# Patient Record
Sex: Male | Born: 1950 | Race: White | Hispanic: No | Marital: Married | Smoking: Never smoker
Health system: Southern US, Community
[De-identification: ages and names within clinical notes are randomized; demographics above are authoritative.]

## PROBLEM LIST (undated history)

## (undated) DIAGNOSIS — I509 Heart failure, unspecified: Secondary | ICD-10-CM

## (undated) DIAGNOSIS — E119 Type 2 diabetes mellitus without complications: Secondary | ICD-10-CM

## (undated) DIAGNOSIS — I10 Essential (primary) hypertension: Secondary | ICD-10-CM

## (undated) DIAGNOSIS — R0602 Shortness of breath: Secondary | ICD-10-CM

## (undated) DIAGNOSIS — M1990 Unspecified osteoarthritis, unspecified site: Secondary | ICD-10-CM

## (undated) DIAGNOSIS — I82459 Acute embolism and thrombosis of unspecified peroneal vein: Secondary | ICD-10-CM

## (undated) DIAGNOSIS — E785 Hyperlipidemia, unspecified: Secondary | ICD-10-CM

## (undated) DIAGNOSIS — F419 Anxiety disorder, unspecified: Secondary | ICD-10-CM

## (undated) DIAGNOSIS — E669 Obesity, unspecified: Secondary | ICD-10-CM

## (undated) DIAGNOSIS — Z9289 Personal history of other medical treatment: Secondary | ICD-10-CM

## (undated) DIAGNOSIS — Z9889 Other specified postprocedural states: Secondary | ICD-10-CM

## (undated) DIAGNOSIS — I2510 Atherosclerotic heart disease of native coronary artery without angina pectoris: Secondary | ICD-10-CM

## (undated) HISTORY — DX: Acute embolism and thrombosis of unspecified peroneal vein: I82.459

## (undated) HISTORY — DX: Hyperlipidemia, unspecified: E78.5

## (undated) HISTORY — DX: Heart failure, unspecified: I50.9

## (undated) HISTORY — DX: Anxiety disorder, unspecified: F41.9

## (undated) HISTORY — DX: Obesity, unspecified: E66.9

## (undated) HISTORY — DX: Essential (primary) hypertension: I10

## (undated) HISTORY — DX: Other specified postprocedural states: Z98.890

## (undated) HISTORY — PX: SHOULDER SURGERY: SHX246

## (undated) HISTORY — DX: Personal history of other medical treatment: Z92.89

## (undated) HISTORY — PX: HERNIA REPAIR: SHX51

## (undated) HISTORY — DX: Type 2 diabetes mellitus without complications: E11.9

---

## 2001-11-28 HISTORY — PX: CORONARY ARTERY BYPASS GRAFT: SHX141

## 2001-11-28 HISTORY — PX: BYPASS GRAFT: SHX909

## 2002-01-31 ENCOUNTER — Inpatient Hospital Stay (HOSPITAL_COMMUNITY): Admission: EM | Admit: 2002-01-31 | Discharge: 2002-02-12 | Payer: Self-pay | Admitting: *Deleted

## 2002-01-31 ENCOUNTER — Encounter: Payer: Self-pay | Admitting: *Deleted

## 2002-01-31 ENCOUNTER — Encounter: Payer: Self-pay | Admitting: Cardiovascular Disease

## 2002-02-01 ENCOUNTER — Encounter: Payer: Self-pay | Admitting: Cardiovascular Disease

## 2002-02-01 HISTORY — PX: CARDIAC CATHETERIZATION: SHX172

## 2002-02-05 ENCOUNTER — Encounter: Payer: Self-pay | Admitting: Cardiothoracic Surgery

## 2002-02-06 ENCOUNTER — Encounter: Payer: Self-pay | Admitting: Cardiothoracic Surgery

## 2002-02-07 ENCOUNTER — Encounter: Payer: Self-pay | Admitting: Cardiothoracic Surgery

## 2002-02-08 ENCOUNTER — Encounter: Payer: Self-pay | Admitting: Cardiothoracic Surgery

## 2002-03-07 ENCOUNTER — Encounter: Admission: RE | Admit: 2002-03-07 | Discharge: 2002-03-07 | Payer: Self-pay | Admitting: Cardiothoracic Surgery

## 2002-03-07 ENCOUNTER — Encounter: Payer: Self-pay | Admitting: Cardiothoracic Surgery

## 2003-11-29 HISTORY — PX: PELVIC LAPAROSCOPY: SHX162

## 2004-07-21 ENCOUNTER — Encounter: Admission: RE | Admit: 2004-07-21 | Discharge: 2004-09-20 | Payer: Self-pay | Admitting: Family Medicine

## 2006-02-22 ENCOUNTER — Encounter: Admission: RE | Admit: 2006-02-22 | Discharge: 2006-03-28 | Payer: Self-pay | Admitting: Orthopedic Surgery

## 2006-08-21 ENCOUNTER — Encounter: Admission: RE | Admit: 2006-08-21 | Discharge: 2006-08-21 | Payer: Self-pay | Admitting: Cardiovascular Disease

## 2006-08-25 ENCOUNTER — Encounter: Payer: Self-pay | Admitting: Cardiovascular Disease

## 2006-08-25 DIAGNOSIS — Z9889 Other specified postprocedural states: Secondary | ICD-10-CM

## 2006-08-25 HISTORY — DX: Other specified postprocedural states: Z98.890

## 2007-04-12 ENCOUNTER — Emergency Department (HOSPITAL_COMMUNITY): Admission: EM | Admit: 2007-04-12 | Discharge: 2007-04-12 | Payer: Self-pay | Admitting: Emergency Medicine

## 2007-06-26 ENCOUNTER — Inpatient Hospital Stay (HOSPITAL_COMMUNITY): Admission: RE | Admit: 2007-06-26 | Discharge: 2007-07-02 | Payer: Self-pay | Admitting: Cardiovascular Disease

## 2009-12-18 ENCOUNTER — Emergency Department (HOSPITAL_COMMUNITY): Admission: EM | Admit: 2009-12-18 | Discharge: 2009-12-18 | Payer: Self-pay | Admitting: Emergency Medicine

## 2010-01-25 ENCOUNTER — Encounter: Admission: RE | Admit: 2010-01-25 | Discharge: 2010-01-25 | Payer: Self-pay | Admitting: Family Medicine

## 2010-03-22 ENCOUNTER — Encounter: Payer: Self-pay | Admitting: Cardiovascular Disease

## 2010-11-26 ENCOUNTER — Encounter
Admission: RE | Admit: 2010-11-26 | Discharge: 2010-11-26 | Payer: Self-pay | Source: Home / Self Care | Attending: Family Medicine | Admitting: Family Medicine

## 2010-11-26 ENCOUNTER — Encounter: Payer: Self-pay | Admitting: Internal Medicine

## 2010-12-02 ENCOUNTER — Encounter: Payer: Self-pay | Admitting: Internal Medicine

## 2010-12-02 DIAGNOSIS — Z9289 Personal history of other medical treatment: Secondary | ICD-10-CM

## 2010-12-02 HISTORY — DX: Personal history of other medical treatment: Z92.89

## 2010-12-13 ENCOUNTER — Encounter: Payer: Self-pay | Admitting: Internal Medicine

## 2011-01-07 ENCOUNTER — Institutional Professional Consult (permissible substitution) (INDEPENDENT_AMBULATORY_CARE_PROVIDER_SITE_OTHER): Payer: No Typology Code available for payment source | Admitting: Internal Medicine

## 2011-01-07 ENCOUNTER — Encounter: Payer: Self-pay | Admitting: Internal Medicine

## 2011-01-07 DIAGNOSIS — I2699 Other pulmonary embolism without acute cor pulmonale: Secondary | ICD-10-CM

## 2011-01-07 DIAGNOSIS — R635 Abnormal weight gain: Secondary | ICD-10-CM | POA: Insufficient documentation

## 2011-01-07 DIAGNOSIS — R0602 Shortness of breath: Secondary | ICD-10-CM | POA: Insufficient documentation

## 2011-01-07 DIAGNOSIS — F411 Generalized anxiety disorder: Secondary | ICD-10-CM | POA: Insufficient documentation

## 2011-01-07 DIAGNOSIS — E785 Hyperlipidemia, unspecified: Secondary | ICD-10-CM | POA: Insufficient documentation

## 2011-01-07 DIAGNOSIS — I82409 Acute embolism and thrombosis of unspecified deep veins of unspecified lower extremity: Secondary | ICD-10-CM | POA: Insufficient documentation

## 2011-01-11 ENCOUNTER — Institutional Professional Consult (permissible substitution): Payer: Self-pay | Admitting: Pulmonary Disease
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
## 2011-01-25 ENCOUNTER — Encounter: Payer: Self-pay | Admitting: Internal Medicine

## 2011-01-25 ENCOUNTER — Ambulatory Visit (INDEPENDENT_AMBULATORY_CARE_PROVIDER_SITE_OTHER): Payer: No Typology Code available for payment source | Admitting: Internal Medicine

## 2011-01-25 ENCOUNTER — Encounter (INDEPENDENT_AMBULATORY_CARE_PROVIDER_SITE_OTHER): Payer: No Typology Code available for payment source

## 2011-01-25 DIAGNOSIS — R0602 Shortness of breath: Secondary | ICD-10-CM

## 2011-01-25 DIAGNOSIS — I2699 Other pulmonary embolism without acute cor pulmonale: Secondary | ICD-10-CM

## 2011-02-03 NOTE — Miscellaneous (Signed)
Summary: Orders Update  Clinical Lists Changes  Orders: Added new Service order of No Charge Patient Arrived (NCPA0) (NCPA0) - Signed 

## 2011-02-03 NOTE — Letter (Signed)
Summary: [facility]   Imported By: Sherian Rein 01/26/2011 09:58:40  _____________________________________________________________________  External Attachment:    Type:   Image     Comment:   External Document
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
## 2011-02-03 NOTE — Letter (Signed)
Summary: [facility]   Imported By: Sherian Rein 01/26/2011 09:59:35  _____________________________________________________________________  External Attachment:    Type:   Image     Comment:   External Document

## 2011-02-13 LAB — POCT CARDIAC MARKERS
CKMB, poc: 1.7 ng/mL (ref 1.0–8.0)
CKMB, poc: 2.1 ng/mL (ref 1.0–8.0)
Myoglobin, poc: 56.5 ng/mL (ref 12–200)
Myoglobin, poc: 64.5 ng/mL (ref 12–200)
Troponin i, poc: <0.05 ng/mL (ref 0.00–0.09)
Troponin i, poc: <0.05 ng/mL (ref 0.00–0.09)

## 2011-02-13 LAB — COMPREHENSIVE METABOLIC PANEL
ALT: 22 U/L (ref 0–53)
AST: 24 U/L (ref 0–37)
Albumin: 3.8 g/dL (ref 3.5–5.2)
Alkaline Phosphatase: 37 U/L — ABNORMAL LOW (ref 39–117)
BUN: 14 mg/dL (ref 6–23)
CO2: 28 mEq/L (ref 19–32)
Calcium: 9.1 mg/dL (ref 8.4–10.5)
Chloride: 101 mEq/L (ref 96–112)
Creatinine, Ser: 0.94 mg/dL (ref 0.4–1.5)
GFR calc Af Amer: >60 mL/min (ref >60)
GFR calc non Af Amer: >60 mL/min (ref >60)
Glucose, Bld: 118 mg/dL — ABNORMAL HIGH (ref 70–99)
Potassium: 4.2 mEq/L (ref 3.5–5.1)
Sodium: 137 mEq/L (ref 135–145)
Total Bilirubin: 0.5 mg/dL (ref 0.3–1.2)
Total Protein: 6.7 g/dL (ref 6.0–8.3)

## 2011-02-13 LAB — CBC
HCT: 37.9 % — ABNORMAL LOW (ref 39.0–52.0)
Hemoglobin: 13.3 g/dL (ref 13.0–17.0)
MCHC: 35 g/dL (ref 30.0–36.0)
MCV: 84.6 fL (ref 78.0–100.0)
Platelets: 197 10*3/uL (ref 150–400)
RBC: 4.48 MIL/uL (ref 4.22–5.81)
RDW: 14.1 % (ref 11.5–15.5)
WBC: 7.1 10*3/uL (ref 4.0–10.5)

## 2011-02-13 LAB — URINALYSIS, ROUTINE W REFLEX MICROSCOPIC
Bilirubin Urine: NEGATIVE
Glucose, UA: NEGATIVE mg/dL
Hgb urine dipstick: NEGATIVE
Ketones, ur: NEGATIVE mg/dL
Nitrite: NEGATIVE
Protein, ur: NEGATIVE mg/dL
Specific Gravity, Urine: 1.011 (ref 1.005–1.030)
Urobilinogen, UA: 0.2 mg/dL (ref 0.0–1.0)
pH: 8 (ref 5.0–8.0)

## 2011-02-13 LAB — DIFFERENTIAL
Basophils Absolute: 0 10*3/uL (ref 0.0–0.1)
Basophils Relative: 0 % (ref 0–1)
Eosinophils Absolute: 0.1 10*3/uL (ref 0.0–0.7)
Eosinophils Relative: 1 % (ref 0–5)
Lymphocytes Relative: 28 % (ref 12–46)
Lymphs Abs: 2 10*3/uL (ref 0.7–4.0)
Monocytes Absolute: 0.7 10*3/uL (ref 0.1–1.0)
Monocytes Relative: 10 % (ref 3–12)
Neutro Abs: 4.3 10*3/uL (ref 1.7–7.7)
Neutrophils Relative %: 60 % (ref 43–77)

## 2011-02-13 LAB — URINE CULTURE
Colony Count: NO GROWTH
Culture: NO GROWTH

## 2011-02-13 LAB — LIPASE, BLOOD: Lipase: 30 U/L (ref 11–59)

## 2011-02-21 DIAGNOSIS — Z9289 Personal history of other medical treatment: Secondary | ICD-10-CM

## 2011-02-21 HISTORY — DX: Personal history of other medical treatment: Z92.89

## 2011-03-25 ENCOUNTER — Ambulatory Visit: Payer: No Typology Code available for payment source | Admitting: Internal Medicine
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
## 2011-04-15 NOTE — Procedures (Signed)
[place] [facility]  Patient:    Eduardo Butler, Eduardo Butler Visit Number: [id number] MRN: [id number]          Service Type: MED Location: 2000 2031 01 Attending Physician:  Waldo Laine Dictated by:   Doylene Canning. Ladona Ridgel, M.D. [facility] Proc. Date: 02/12/02 Admit Date:  01/31/2002 Discharge Date: 02/12/2002   CC:         Ramon Dredge B. Tyrone Sage, M.D.  [name], M.D.  Anna Genre Little, M.D.   Procedure Report  PROCEDURE PERFORMED:  Invasive electrophysiologic testing.  INDICATIONS FOR PROCEDURE:  Ventricular fibrillation arrest in the setting of no acute myocardial infarction, status post coronary artery bypass grafting with preserved left ventricular function.  INTRODUCTION:  The patient is a very pleasant 60 year old man who was exercising when he had experienced a cardiac arrest due to ventricular fibrillation.  His initial EKG did not demonstrate myocardial infarction.  He was taken to the catheterization lab where he had severe three vessel disease, including a chronically occluded left anterior descending artery filled by collaterals.  The patient subsequently underwent cardiac revascularization with bypass grafting, and at the time of his cath, was found to have preserved left ventricular function.  He is now referred for invasive electrophysiologic testing.  DESCRIPTION OF PROCEDURE:  After informed consent was obtained, the patient was taken to the diagnostic EP lab in a fasted state.  After the usual preparation and draping, intravenous phentanyl and midazolam was given for sedation.  A 5 French quadripolar catheter was inserted percutaneously in the right femoral vein and advanced to the RV apex.  A 5 French quadripolar catheter was inserted percutaneously in the right femoral vein and advanced to the hisbundle region.  After measurement of the basic intervals, rapid ventricular pacing was carried up in the RV apex demonstrating a VA  wenkebach cycle length of 490 milliseconds.  Programmed ventricular stimulation was carried out from the RV apex at a basic dry cycle length of 600 milliseconds.  The S1/S2 interval was stepwise decreased down to 310 milliseconds with a retrograde AV node ERP was observed. During programmed ventricular stimulation, the atrial activation sequence was midline and decremental.  Additional S1, S2, S1, S2, S3, and S1, S2, S3, S4 stimuli were delivered, both from the RV apex and the RV outflow tract at a basic dry cycle length of 600 and 400 milliseconds.  The S1/S2, S2/S3, and S3/S4 intervals were stepwise decreased down to ventricular refractions. During ventricular pacing, the patient developed atrial fibrillation.  During programmed ventricular stimulation, there was no inducible VT or ventricular fibrillation.  At this point, the patient was heavily sedated, and underwent synchronized DC cardioversion with 300 joules of energy restoring sinus rhythm.  The catheters had been removed.  Hemostasis was assured, and the patient returned to his room in satisfactory condition.  COMPLICATIONS:  None.  RESULTS: 1. Baseline ECG:  The baseline ECG demonstrates normal sinus rhythm, normal    axis and intervals, and nonspecific T-wave abnormality. 2. Baseline intervals:  The sinus cycle length was 934 milliseconds.  QRS    duration was 86 milliseconds.  The QT duration was 422 milliseconds.  The    HV interval 50 milliseconds. 3. Rapid ventricular pacing:  Rapid ventricular pacing was carried out from    the RV apex demonstrating a VE wenkebach cycle length of 490 milliseconds.    The atrial activation sequence was midline and decremental. 4. Programmed ventricular stimulation:  Programmed ventricular stimulation was    carried out  from the RV apex, as well as the RV outflow tract, and basic    dry cycle length of 640 milliseconds.  S1/S2, S1/S2/S3, and S1/S2/S3/S4    stimuli were delivered,  including long-short stimulation.  During    programmed ventricular stimulation, the S1/S2, S2/S3, and S3/4 intervals    were stepwise decreased down to ventricular refractoriness, but there was    no induction of ventricular tachycardia or ventricular fibrillation. 5. Arrhythmias observed:  Atrial fibrillation, initiation of rapid ventricular    pacing, duration sustained, termination 300 joules of synchronized energy.  CONCLUSION:  This study demonstrates no inducible ventricular tachycardia or ventricular fibrillation during electrophysiology testing.  It does demonstrate inducible atrial fibrillation with ventricular pacing, and successful DC cardioversion restoring sinus rhythm. Dictated by:   Doylene Canning. Ladona Ridgel, M.D. [facility] Attending Physician:  Waldo Laine DD:  02/12/02 TD:  02/13/02 Job: [id number]

## 2011-04-15 NOTE — Cardiovascular Report (Signed)
. [facility]  Patient:    Eduardo Butler, Eduardo Butler Visit Number: [id number] MRN: [id number]          Service Type: MED Location: CCUA 2927 01 Attending Physician:  Berry, Jonathan [place] Dictated by:   A. Kem Boroughs, M.D. Proc. Date: 02/01/02 Admit Date:  01/31/2002                          Cardiac Catheterization  PROCEDURES PERFORMED: Left heart catheterization, selective coronary angiography, left ventriculography, and nonselective left subclavian angiography.  INDICATIONS: The patient is a 60 year old gentleman who experienced sudden cardiac death immediately postexercise. He was resuscitated and is referred for left heart catheterization to evaluate possibility of ischemia as the etiology for his sudden cardiac death. My name is Dr. Domingo Sep, I did this procedure. There were no immediate complications.  DESCRIPTION OF PROCEDURE: After discussion of risks and benefits, written informed consent was obtained. The patient was brought to the cardiac catheterization suite in the fasting state where his bilateral groins were prepped and draped in the usual sterile fashion. Lidocaine 1% was used for local anesthesia over the right femoral artery. Access to the right femoral artery and right femoral vein were obtained by the modified Seldinger technique. A 6 French hemostasis sheath was positioned in the right femoral artery and flushed with heparinized saline. A 6 French Judkins left 4 catheter was used to selectively engage and visualize the left coronary artery. A 6 French Judkins right 4 catheter was used to selectively engage and visualize the right coronary artery.  Nonionic contrast was hand injected in multiple angiographic views were used to fully delineate the coronary anatomy. This catheter was then exchanged for a 6 French angled pigtail catheter which was advanced to the aortic valve, retrograde across the aortic valve and positioned in the mid  left ventricular cavity. Left ventriculography was undertaken in the 30-degree RAO projection as well as the 45-degree LAO projection and nonionic contrast was power injected for both at 13 cc/sec. over 3 seconds. This catheter was then pulled back across the aortic valve; no gradient was detected. This catheter was exchanged for the previously used Judkins right 4 catheter which was used to selectively engage and visualize the left subclavian artery and to nonselectively visualize the left internal mammary artery. This catheter was removed, and the sheath flushed with heparinized saline and subsequently removed. Hemostasis of the right femoral artery was obtained with firm manual pressure.  RESULTS:  Pressures of mmHg: The LV 122/8, the AO 122/77 (no gradient).  LEFT VENTRICULOGRAPHY: Left ventriculography reveals mild to moderate infralateral hypokinesis with otherwise preserved left ventricular systolic function. An estimated ejection fraction is approximately 55%. No mitral regurgitation is appreciated.  The left main coronary artery is a large caliber vessel which gives rise to the LAD and the left circumflex coronary arteries. The left main coronary artery contains no significant disease.  The left anterior descending coronary artery is a small caliber vessel which is 100% occluded after the takeoff of the first septal perforator in its proximal portion.  The left circumflex coronary artery is a large caliber vessel which gives rise to two obtuse marginal branches. The left circumflex coronary artery contains a mid vessel 80% stenosis followed by a subsequent 75% stenosis.  The right coronary artery is a large caliber vessel which gives rise to one RV marginal branch prior to its bifurcation into the posterior descending coronary artery and a posterolateral left ventricular branch,  which appears to be twin system. The right coronary artery is notable for a 95% stenosis at  the takeoff of the PDA branch as well as a 75% stenosis at the takeoff of the pain level branch; both of these involving the bifurcation point. The right coronary artery is dominant for posterior circulation. Of note, right to left collaterals are appreciated.  Nonselective angiography and left subclavian artery reveals a patent LIMA.  IMPRESSION: 1. Three-vessel coronary artery disease. 2. Preserved overall left ventricular systolic function with loculated    mild to moderate anterolateral hypokinesis. 3. No mitral regurgitation noted.  RECOMMENDATIONS: 1. CT Surgery consult. 2. Aggressive secondary management. Dictated by:   A. Kem Boroughs, M.D. Attending Physician:  Berry, Jonathan [place] DD:  02/01/02 TD:  02/02/02 Job: [id number]

## 2011-04-15 NOTE — Consult Note (Signed)
[place]. [facility]  Patient:    Eduardo Butler, Eduardo Butler Visit Number: [id number] MRN: [id number]          Service Type: MED Location: CCUA 2927 01 Attending Physician:  Butler, Eduardo [place] Dictated by:   Eduardo Butler, M.D. Admit Date:  01/31/2002   CC:         Eduardo Butler, M.D.  Eduardo Butler, M.D.   Consultation Report  REFERRING PHYSICIAN:  Madaline Butler, M.D.  FOLLOW-UP CARDIOLOGIST:  Eduardo Butler, M.D.  PRIMARY CARE PHYSICIAN:  Eduardo Butler, M.D.  REFERRING PHYSICIAN:  Sudden death event at home and coronary occlusive Butler.  HISTORY OF PRESENT ILLNESS:  The patient is a 60 year old male who today has his 51st birthday.  He has no previous medical history.  He was exercising at home January 31, 2002 on an exercise bike.  He finished riding the bike and started to eat a tangerine when he had Eduardo loss of consciousness.  He fell to the floor.  His wife, in a separate room, heard the noise, called 911, and a neighbor who started performing CPR.  [employer] EMS arrived within approximately 10 minutes with automatic defibrillator.  With two shocks, he returned to normal sinus rhythm.  The patient denied any previous history of chest pain.  He has had no previous history of myocardial infarction.  He did note increasing fatigue over the past several weeks and had noted some blurry vision on the day prior to the event.  Peak CK was 246 with a MB of 6.1.  Peak troponin was 0.08.  Cardiac risk factors include hyperlipidemia.  The patient denies hypertension, denies diabetes, denies smoking.  Denies previous stroke.  Denies renal insufficiency.  PAST MEDICAL HISTORY:  The patient on two occasions has gone to give blood at the Eduardo Butler and was turned down and told that he had evidence of hepatitis C.  His family physician Eduardo Butler checked this subsequently and these findings were not confirmed.  He has had no  previous surgeries.  SOCIAL HISTORY:  The patient is married.  Lives with his wife.  Has two children.  As noted, denies alcohol, tobacco, or other drug use.  FAMILY HISTORY:  Significant for cancer with his fathers death secondary to cancer.  His mother has diabetes and he has a brother who has diabetes.  MEDICATIONS:  None except the occasional Tylenol.  ALLERGIES:  None.  REVIEW OF SYSTEMS:  CARDIAC:  The patient denies chest pain, edema, resting shortness of breath, palpitations.  He has had no previous syncopal episode the one precipitating this admission.  Denies presyncope, orthopnea.  Does have some exertional shortness of breath.  GENERAL:  The patient has no constitutional symptoms other than "being tired recently."  Denies any respiratory problems.  Denies any hemoptysis.  Denies any gastrointestinal problems.  Denies any neurological problems prior to this event.  Denies any musculoskeletal.  He has had numbness in both hands in the past and was told he had carpal tunnel syndrome.  This is not actively bothering him at this time.  GENITOURINARY:  Denies any hematuria.  INFECTIOUS:  As noted above. The patient on two occasions was turned down for blood donation because of traces of hepatitis C.  PSYCHIATRIC:  Denies claudication.  Other review of systems are negative.  PHYSICAL EXAMINATION:  GENERAL:  The patient is awake, alert but has some memory problems.  He is most bothered by short-term memory.  Cannot remember what happened this morning or particularly remember what happened yesterday.  He is better with long-term memory such as inquiring if I new Dr. 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  VITAL SIGNS:  Blood pressure 110/60, pulse 70, sinus rhythm.  He was running a low grade temperature earlier today.  He is afebrile at this time.  HEENT:  Pupils are equal, round and reactive to light.  NECK:  Without adenopathy.  There  is no jugular venous distension.  CARDIOVASCULAR:  Regular rate and rhythm without murmurs or gallops. There is no wheezing.  ABDOMEN:  No palpable abdominal masses.  Aorta is not enlarged.  EXTREMITIES:  There are 2+ DP and PT pulses bilaterally.  He has radial and ulnar pulses bilaterally, easily palpable.  He has negative balance test bilaterally.  NEUROLOGICAL:  The patient is awake, alert, and answers questions appropriately.  He has definite short-term memory deficit and some difficulty with long-term and constantly asks his family to confirm any answers that he gives.  LABORATORY DATA:  Hematocrit 38.9, hemoglobin 13.3, white count 15,600, platelet count 265,000.  Creatinine 1.2.  Cardiac catheterization was performed yesterday by Dr. Madaline Butler.  It demonstrates a 95% stenosis in the right coronary artery at the takeoff of the posterior lateral branch but also has Butler at the PDA that supplies collateral filling extensively to the LAD which is totally occluded proximally to the obtuse marginal.  The circumflex has sequential 75% and 80% stenosis. Overall LV function is preserved.  There is no mitral regurgitation.  IMPRESSION:  The patient with Eduardo death episode almost assuredly related to ischemia with hypoxic encephalopathy which is now improving.  RECOMMENDATIONS:  I have discussed with the patient proceeding during this hospitalization with coronary artery bypass grafting.  I have also discussed this with his wife and family including the risks and options involved including the risks of death, infection, stroke, myocardial infarction, or blood transfusion.  At this point, the patient seems to be improving neurologically.  We will continue to monitor this for several days and potentially proceed with bypass surgery mid to late this week.  Following surgery, I discussed with Dr. Doylene Canning. Butler the Eduardo Butler and  plus/minus placement of a Eduardo Butler.  Also  follow up with liver enzyme testing and hepatitis screening because of his history of hepatitis C.   Dictated by: Eduardo Butler, M.D. Attending Physician:  Butler, Eduardo [place] DD:  02/02/02 TD:  02/04/02 Job: [id number]

## 2011-04-15 NOTE — Discharge Summary (Signed)
[place]. [facility]  Patient:    Eduardo Butler, Eduardo Butler Visit Number: [id number] MRN: [id number]          Service Type: MED Location: 2000 2031 01 Attending Physician:  Waldo Laine Dictated by:   Sherrie George, P.A. Admit Date:  01/31/2002 Discharge Date: 02/12/2002   CC:         Madaline Savage, M.D.  [name], M.D.  Anna Genre Little, M.D.  Genene Churn. Love, M.D.   Referring Physician Discharge [facility]  DATE OF BIRTH:  10/22/1951  ADMISSION DIAGNOSES: 1. Coronary artery disease with sudden death, ventricular fibrillation arrest,    and successful resuscitation. 2. Hyperlipidemia.  DISCHARGE DIAGNOSES: 1. Ventricular fibrillation with sudden death arrest and severe three vessel    coronary artery disease with 100% left anterior descending,    75-80% circumflex stenosis, 95% right coronary artery stenosis at the    takeoff of the posterolateral artery, and ejection fraction 55%. 2. Hypoxic encephalopathy secondary to ventricular fibrillation arrest,    resolved. 3. Postoperative atrial fibrillation. 4. Hyperlipidemia.  PROCEDURES: 1. Cardiac catheterization on February 01, 2002. 2. Coronary artery bypass grafting x 4 with left internal mammary artery to    the left anterior descending, radial artery to the obtuse marginal,    and saphenous vein graft to the acute marginal and distal right coronary    arteries on February 05, 2002. 3. Electrophysiology study on February 11, 2002.  HISTORY OF PRESENT ILLNESS:  The patient is a 60 year old white male who was exercising at home and apparently collapsed after exercising while eating a tangerine.  A neighbor was present and performed CPR.  EMS was called.  The patient was defibrillated twice and then transferred to the emergency room at [facility].  There the patient was stable.  He had been given Ativan and attempts to intubate were unsuccessful at the scene.  On arrival, his  pupils were pinpoint and nonreactive.  He had a snoring respiration, but he was moving all four extremities voluntarily.  He was not following commands.  He was not speaking.  The heart rate was 85.  The initial blood pressure was 114.  His pulse oximetry was 98%.  It was thought that Julieanne Manson, M.D., was his physician.  [name], M.D., was on call and saw the patient in the ER and assumed his care.  It turned out that his real physician of record is [name], M.D.  HOSPITAL COURSE:  He was seen by [name], M.D., and it was his opinion that he had had sudden death secondary to ventricular fibrillation arrest.  He was successfully defibrillated.  Because of his confusion, a head CT was obtained and later a neurology consult was obtained.  The head CT was negative.  The initial neurologic evaluation by the neurology service showed that the patient probably had a mild hypoxic encephalopathy compounded by sedation given to the patient.  He was maintained on heparin based on beta blockers and aspirin.  He showed slow steady improvement over the next 48 hours and his mentation improved.  He initially had no memory of the events that occurred, but with time did get those back.  Once he was stable both from a hemodynamic and a neurologic point of view, a cardiac catheterization was performed.  This showed a 95% distal RCA stenosis at the takeoff of the posterolateral.  The LAD was totally occluded proximally.  There were 80% and  75% stenoses at the left circumflex.  The patients ejection fraction was 55%. No MR was noted.  At this point, it was agreed that the patient should undergo coronary artery bypass grafting.  Gwenith Daily Tyrone Sage, M.D., was contacted.  He evaluated the patient and agreed that he needed coronary artery bypass grafting, but there was some concern about his neurologic status and he had difficulty remembering anything that he was told.  Over the next  several days, his neurologic status continued to improve and he was ultimately scheduled and taken to the OR on February 05, 2002, at which time he underwent the surgery as described above.  This was done using cardiopulmonary bypass, potassium cardioplegia, and profound myocardial hypothermia.  There was also some question about him having hepatitis C.  A hepatitis panel done prior to surgery was negative.  The patient tolerated his surgery well and was transferred to the intensive care unit in satisfactory condition.  He was alert and oriented x 3 the first postoperative morning.  The cardiac index was good.  His mediastinal tubes and Swan were discontinued at that point.  He was slowly mobilized and worked on pulmonary toilet.  He had one episode of atrial fibrillation on the second postoperative day.  He was digitalized and converted back to a sinus rhythm.  He continued to make slow steady progress. His memory continued to improve.  On February 12, 2002, he was taken back to the lab for an EP study by Doylene Canning. Ladona Ridgel, M.D.  At this time, he was found to be not inducible for PT.  He had a normal His-Purkinje function.  He did have inducible atrial fibrillation and this was successful DC cardioverted to a sinus rhythm.  DISPOSITION:  It was Dr. Doylene Canning. Taylors opinion at that point that he was ready for discharge.  Gwenith Daily Tyrone Sage, M.D., was in agreement and he was subsequently discharged home the afternoon of February 12, 2002.  DISCHARGE MEDICATIONS: 1. Folic acid 1 mg q.d. 2. Aspirin 325 mg q.d. 3. Lopressor 25 mg p.o. b.i.d. 4. Zocor 40 mg q.d. 5. Altace 10 mg q.d. 6. Tylox one to two p.o. q.4h. p.r.n. 7. Pepcid 20 mg p.o. b.i.d. 8. Lasix 40 mg q.d. 9. Kay-Ciel 20 mEq p.o. q.d. x 7 days.   FOLLOW-UP:  Follow up with Ramon Dredge B. Tyrone Sage, M.D., in three weeks.  Follow up with Doylene Canning. Ladona Ridgel, M.D., and [name], M.D., in two weeks.  CONDITION ON DISCHARGE:   Improving.  DISCHARGE LABORATORY DATA:  Electrolytes are normal with a sodium of 133, potassium 4, chloride 101, CO2 29, BUN 14, creatinine 0.8, and glucose 98. Hemoglobin 9, hematocrit 26, white count 9.8, platelets 221,000. Dictated by:   Sherrie George, P.A. Attending Physician:  Waldo Laine DD:  04/08/02 TD:  04/08/02 Job: [id number]

## 2011-04-15 NOTE — Consult Note (Signed)
[place]  Patient:    Eduardo Butler, Eduardo Butler Visit Number: [id number] MRN: [id number]          Service Type: MED Location: [facility] 2902 01 Attending Physician:  Berry, Jonathan [place] Dictated by:   Doylene Canning. Ladona Ridgel, M.D. [facility] Proc. Date: 02/02/02 Admit Date:  01/31/2002   CC:         Colon Flattery. Gery Pray, M.D.  Anna Genre Little, M.D.   Consultation Report  REQUESTING PHYSICIAN:  Colon Flattery. Gery Pray, M.D.  REASON FOR CONSULTATION:  Evaluation of ventricular fibrillation at rest.  HISTORY OF PRESENT ILLNESS:  The patient is a very pleasant, 60 year old man who has been remarkably healthy with only a history of hyperlipidemia. He exercises several times a week. He was in his usual state of health until January 31, 2002, when he had a cardiac arrest. The patient states that he remembers having recently finished exercising and was actually eating a piece of fruit when he, according to his wife, collapsed and was unresponsive. A neighbor was called and performed CPR until the EMS service arrived where he was defibrillated back to normal sinus rhythm. This is all by report; there are no strips available at the present time. He was subsequently admitted to the hospital where serial cardiac enzymes failed to demonstrate any significant myocardial infarction. His initial CK was 141 with an MB fraction of 3.3, and a troponin of 0.04. His neurologic status was quite impaired initially with oxygen encephalopathy, however, over the last several days, it has improved dramatically, and he is now back to normal except for a deficit in short-term memory. The patient denies any previous history of syncope, chest pain, or shortness of breath.  FAMILY HISTORY:  Not remarkable for coronary disease. He is 1 of 10 siblings, and there is no coronary disease in the family.  His father died late in life of complications of a stroke.  PAST MEDICAL HISTORY:  Notable for hyperlipidemia as  previously described.  PAST SURGICAL HISTORY:  He has no history of any surgeries.  MEDICATIONS IN HOSPITAL:  Include Lopressor, aspirin, heparin, and Pepcid.  SOCIAL HISTORY:  The patient is married, he has 2 children and denies tobacco or ethanol abuse.  REVIEW OF SYSTEMS:  Negative for fevers or chills, night sweats or weight changes. He denies any history of headache or bleeding problems; denies any vision or hearing loss; he denies any skin rashes or lesions; he denies any chest pain, shortness of breath, dyspnea on exertion, claudication, cough, wheezes or palpitations. He denies any arthritic or joint symptoms. He denies any dysuria or hematuria; he denies nocturia; he denies any weakness or numbness; he denies any nausea, vomiting, diarrhea, or constipation. He denies any weight changes or skin problems.  PHYSICAL EXAMINATION:  GENERAL:  He is a pleasant, well-appearing, middle-aged man in no distress who looks somewhat younger than his stated age.  VITAL SIGNS:  His blood pressure was 118/76, pulse was 70-80, respirations were 18.  HEENT:  Normocephalic, atraumatic. Pupils are equal, round, and reactive to light. Oropharynx was moist.  NECK:  Revealed no jugular venous distention. There are no carotid bruits. The thyroid was not appreciably enlarged. Carotid upstrokes were symmetric.  CARDIOVASCULAR:  Revealed a regular rate and rhythm with normal S1 and S2; there were no murmurs, rubs or gallops present.  LUNGS:  Clear bilaterally to auscultation without wheezes.  ABDOMEN:  Soft, nontender, nondistended. There was no organomegaly.  EXTREMITIES:  Demonstrated no clubbing, cyanosis, or edema.  There were no skin lesions in the legs. There were no arthritic symptoms or effusions or joint tenderness.  NEUROLOGIC:  He was alert and oriented to person, place, and year. Cranial nerves were intact. Strength was 5/5 symmetrically. He did have a residual short term memory  deficit.  RADIOLOGY:  EKG demonstrates normal sinus rhythm with nonspecific T-wave abnormality.  IMPRESSION: 1. Status post cardiac arrest with successful resuscitation. 2. There was 3-vessel coronary artery disease status post catheterization    demonstrated a chronically occluded left anterior descending    artery with collaterals from the right coronary artery with    right coronary artery and left circumflex stenosis. 3. Hyperlipidemia.  DISCUSSION:  I agree with proceeding with revascularization with bypass grafting. Following this, electrophysiologic testing will be indicated, although I suspect the likelihood of inducible ventricular tachycardia or ventricular fibrillation is very low once revascularization has been accomplished. Would continue on with aspirin and beta blockers for now. Dictated by:   Doylene Canning. Ladona Ridgel, M.D. [facility] Attending Physician:  Berry, Jonathan [place] DD:  02/02/02 TD:  02/04/02 Job: [id number]

## 2011-09-08 ENCOUNTER — Encounter (INDEPENDENT_AMBULATORY_CARE_PROVIDER_SITE_OTHER): Payer: Self-pay | Admitting: Surgery

## 2011-09-12 LAB — CBC
HCT: 38.4 — ABNORMAL LOW
HCT: 38.6 — ABNORMAL LOW
HCT: 39.2
HCT: 39.7
HCT: 40.1
HCT: 40.7
HCT: 41.3
Hemoglobin: 13.1
Hemoglobin: 13.1
Hemoglobin: 13.4
Hemoglobin: 13.5
Hemoglobin: 13.4
Hemoglobin: 13.5
Hemoglobin: 13.8
MCHC: 33.9
MCHC: 34
MCHC: 34
MCHC: 34.2
MCHC: 33.2
MCHC: 33.3
MCHC: 33.5
MCV: 83
MCV: 83
MCV: 84
MCV: 84.4
MCV: 84.1
MCV: 84.9
MCV: 85.4
Platelets: 229
Platelets: 229
Platelets: 230
Platelets: 244
Platelets: 249
Platelets: 251
Platelets: 260
RBC: 4.55
RBC: 4.66
RBC: 4.72
RBC: 4.73
RBC: 4.7
RBC: 4.79
RBC: 4.91
RDW: 13.5
RDW: 13.6
RDW: 13.8
RDW: 13.9
RDW: 13.6
RDW: 13.9
RDW: 13.9
WBC: 7.8
WBC: 8.1
WBC: 8.2
WBC: 8.7
WBC: 8.2
WBC: 8.6
WBC: 8.7

## 2011-09-12 LAB — CARDIAC PANEL(CRET KIN+CKTOT+MB+TROPI)
CK, MB: 2.1
Relative Index: 2
Total CK: 105
Troponin I: 0.01

## 2011-09-12 LAB — PROTEIN C, TOTAL: Protein C, Total: 84 % (ref 70–140)

## 2011-09-12 LAB — FACTOR 5 LEIDEN

## 2011-09-12 LAB — HEPARIN LEVEL (UNFRACTIONATED)
Heparin Unfractionated: 0.35
Heparin Unfractionated: 0.62
Heparin Unfractionated: 0.64
Heparin Unfractionated: 0.72 — ABNORMAL HIGH
Heparin Unfractionated: 0.63
Heparin Unfractionated: 0.64

## 2011-09-12 LAB — PROTIME-INR
INR: 1.1
INR: 1.6 — ABNORMAL HIGH
INR: 2.7 — ABNORMAL HIGH
INR: 3 — ABNORMAL HIGH
INR: 1
INR: 1
INR: 1.1
Prothrombin Time: 14.3
Prothrombin Time: 19.6 — ABNORMAL HIGH
Prothrombin Time: 30.2 — ABNORMAL HIGH
Prothrombin Time: 33.1 — ABNORMAL HIGH
Prothrombin Time: 13.1
Prothrombin Time: 13.6
Prothrombin Time: 14.3

## 2011-09-12 LAB — HEPATIC FUNCTION PANEL
ALT: 21
AST: 17
Albumin: 3.9
Alkaline Phosphatase: 45
Bilirubin, Direct: 0.1
Indirect Bilirubin: 0.8
Total Bilirubin: 0.9
Total Protein: 6.7

## 2011-09-12 LAB — BASIC METABOLIC PANEL
BUN: 12
BUN: 13
CO2: 27
CO2: 29
Calcium: 8.9
Calcium: 9
Chloride: 101
Chloride: 104
Creatinine, Ser: 0.83
Creatinine, Ser: 1
GFR calc Af Amer: >60
GFR calc Af Amer: >60
GFR calc non Af Amer: >60
GFR calc non Af Amer: >60
Glucose, Bld: 104 — ABNORMAL HIGH
Glucose, Bld: 94
Potassium: 4
Potassium: 4.1
Sodium: 135
Sodium: 139

## 2011-09-12 LAB — PROTEIN S ACTIVITY: Protein S Activity: 110 % (ref 69–129)

## 2011-09-12 LAB — DIFFERENTIAL
Basophils Absolute: 0
Basophils Relative: 0
Eosinophils Absolute: 0.1
Eosinophils Relative: 1
Lymphocytes Relative: 25
Lymphs Abs: 2.2
Monocytes Absolute: 0.4
Monocytes Relative: 5
Neutro Abs: 6
Neutrophils Relative %: 69

## 2011-09-12 LAB — PROTHROMBIN GENE MUTATION

## 2011-09-12 LAB — CARDIOLIPIN ANTIBODIES, IGG, IGM, IGA
Anticardiolipin IgA: <7 — ABNORMAL LOW (ref <13)
Anticardiolipin IgG: <7 — ABNORMAL LOW (ref <11)
Anticardiolipin IgM: <7 — ABNORMAL LOW (ref <10)

## 2011-09-12 LAB — BETA-2-GLYCOPROTEIN I ABS, IGG/M/A
Beta-2 Glyco I IgG: <4 U/mL (ref <20)
Beta-2-Glycoprotein I IgA: <4 U/mL (ref <10)
Beta-2-Glycoprotein I IgM: <4 U/mL (ref <10)

## 2011-09-12 LAB — LUPUS ANTICOAGULANT PANEL
DRVVT: 50 — ABNORMAL HIGH (ref 36.1–47.0)
Lupus Anticoagulant: NOT DETECTED
PTT Lupus Anticoagulant: 57.9 — ABNORMAL HIGH (ref 36.3–48.8)
PTTLA 4:1 Mix: 53.3 — ABNORMAL HIGH (ref 36.3–48.8)
PTTLA Confirmation: 0 (ref <8.0)
dRVVT Incubated 1:1 Mix: 39.5 (ref 36.1–47.0)

## 2011-09-12 LAB — APTT
aPTT: >200
aPTT: 31

## 2011-09-12 LAB — LIPID PANEL
Cholesterol: 140
HDL: 39 — ABNORMAL LOW
LDL Cholesterol: 76
Total CHOL/HDL Ratio: 3.6
Triglycerides: 124
VLDL: 25

## 2011-09-12 LAB — PROTEIN S, TOTAL: Protein S Ag, Total: 109 % (ref 70–140)

## 2011-09-12 LAB — ANTITHROMBIN III: AntiThromb III Func: 100 (ref 76–126)

## 2011-09-12 LAB — PROTEIN C ACTIVITY: Protein C Activity: 146 % — ABNORMAL HIGH (ref 75–133)

## 2011-09-12 LAB — HOMOCYSTEINE: Homocysteine: 11.2

## 2011-09-12 LAB — TSH: TSH: 1.454

## 2011-09-19 ENCOUNTER — Encounter (INDEPENDENT_AMBULATORY_CARE_PROVIDER_SITE_OTHER): Payer: Self-pay | Admitting: Surgery

## 2011-09-19 ENCOUNTER — Ambulatory Visit (INDEPENDENT_AMBULATORY_CARE_PROVIDER_SITE_OTHER): Payer: No Typology Code available for payment source | Admitting: Surgery

## 2011-09-19 VITALS — BP 122/80 | HR 80 | Temp 98.6°F | Resp 18 | Ht 66.0 in | Wt 242.6 lb

## 2011-09-19 DIAGNOSIS — K429 Umbilical hernia without obstruction or gangrene: Secondary | ICD-10-CM

## 2011-09-19 NOTE — Progress Notes (Signed)
Chief Complaint  Patient presents with  . Umbilical Hernia    HPI Eduardo Butler is a 60 y.o. male.   HPIThis is a pleasant gentleman referred by Dr. Clarene Duke her evaluation of an umbilical hernia. He has had the hernia for a least 2 years. It only causes minimal discomfort. He has no tract symptoms. He is otherwise without complaints.  Past Medical History  Diagnosis Date  . Anxiety   . Hyperlipidemia   . Hypertension   . Peroneal DVT (deep venous thrombosis)   . CHF (congestive heart failure)     Past Surgical History  Procedure Date  . Hernia repair   . Shoulder surgery   . Bypass graft 2003    4 bypass   . Pelvic laparoscopy 2005    History reviewed. No pertinent family history.  Social History History  Substance Use Topics  . Smoking status: Never Smoker   . Smokeless tobacco: Not on file  . Alcohol Use: No    No Known Allergies  Current Outpatient Prescriptions  Medication Sig Dispense Refill  . ALPRAZolam (XANAX XR) 0.5 MG 24 hr tablet Take 0.5 mg by mouth 2 (two) times daily.        [name] dexlansoprazole (DEXILANT) 60 MG capsule Take 60 mg by mouth daily.        [name] ezetimibe-simvastatin (VYTORIN) 10-40 MG per tablet Take 1 tablet by mouth at bedtime.        . folic acid (FOLVITE) 1 MG tablet Take 1 mg by mouth daily.        . hydrochlorothiazide (MICROZIDE) 12.5 MG capsule Take 12.5 mg by mouth daily.        [name] losartan (COZAAR) 50 MG tablet Take 50 mg by mouth daily.        . nebivolol (BYSTOLIC) 5 MG tablet Take 5 mg by mouth daily.        . Testosterone (FORTESTA TD) Place 10 mg onto the skin daily.        [name] warfarin (COUMADIN) 5 MG tablet Take 5 mg by mouth daily. M,W,F and Coumadin 4mg  T, Th, S, S       . tadalafil (CIALIS) 5 MG tablet Take 5 mg by mouth daily as needed.          Review of Systems Review of Systems  Constitutional: Negative for fever, chills and unexpected weight change.  HENT: Negative for hearing loss, congestion, sore throat, trouble  swallowing and voice change.   Eyes: Negative for visual disturbance.  Respiratory: Negative for cough and wheezing.   Cardiovascular: Negative for chest pain, palpitations and leg swelling.  Gastrointestinal: Negative for nausea, vomiting, abdominal pain, diarrhea, constipation, blood in stool, abdominal distention, anal bleeding and rectal pain.  Genitourinary: Negative for hematuria and difficulty urinating.  Musculoskeletal: Negative for arthralgias.  Skin: Negative for rash and wound.  Neurological: Negative for seizures, syncope, weakness and headaches.  Hematological: Negative for adenopathy. Does not bruise/bleed easily.  Psychiatric/Behavioral: Negative for confusion.    Blood pressure 122/80, pulse 80, temperature 98.6 F (37 C), temperature source Temporal, resp. rate 18, height 5\' 6"  (1.676 m), weight 242 lb 9.6 oz (110.043 kg).  Physical Exam Physical Exam  Constitutional: He is oriented to person, place, and time. He appears well-developed and well-nourished. No distress.  HENT:  Head: Normocephalic and atraumatic.  Right Ear: External ear normal.  Left Ear: External ear normal.  Nose: Nose normal.  Mouth/Throat: No oropharyngeal exudate.  Eyes: Conjunctivae are normal. Pupils  are equal, round, and reactive to light. Left eye exhibits no discharge. No scleral icterus.  Neck: Normal range of motion. No tracheal deviation present. No thyromegaly present.  Cardiovascular: Normal rate, regular rhythm, normal heart sounds and intact distal pulses.  Exam reveals no friction rub.   Pulmonary/Chest: Effort normal and breath sounds normal. No respiratory distress. He has no wheezes.  Abdominal: Soft. Bowel sounds are normal. He exhibits no distension. There is no tenderness. There is no rebound.       There is a moderate sized, reducible, umbilical hernia  Musculoskeletal: Normal range of motion. He exhibits no edema and no tenderness.  Lymphadenopathy:    He has no cervical  adenopathy.  Neurological: He is alert and oriented to person, place, and time.  Skin: Skin is warm and dry. No rash noted. No erythema.  Psychiatric: His behavior is normal. Judgment normal.    Data Reviewed I have reviewed the notes from Dr. Fredirick Maudlin office  Assessment    Easily reducible umbilical hernia    Plan    I discussed this with him in detail. Repair is recommended with mesh. We discussed his previous history of DVT and pulmonary embolism. He will need to stop his Coumadin 5 days preoperatively. He does not want to be on Lovenox preoperatively and I discussed the risks of this with him. He understands his risk of recurrent DVT. I will give him heparin preoperatively. I discussed the risks of surgery which includes but is not limited to bleeding, infection, injury to surrounding structures, chronic pain, recurrence, bleeding, cardiopulmonary issues, DVT, etc. He understands and wished to proceed. Surgery will be scheduled. There is a good likelihood of success. We will obtain preoperative cardiac clearance from Dr. Edward Jolly A 09/19/2011, 11:10 AM

## 2011-09-29 ENCOUNTER — Telehealth (INDEPENDENT_AMBULATORY_CARE_PROVIDER_SITE_OTHER): Payer: Self-pay

## 2011-09-29 NOTE — Telephone Encounter (Signed)
Olegario Messier called to notify us that the pt has been cleared from a cardiac standpoint but the pt will need lovenox bridging for the Coumadin. Olegario Messier just ask that we call her at [phone] as soon as we know the pt's surgery date so she can arrange all the Lovenox stuff for the pt. [facility]

## 2011-10-07 ENCOUNTER — Other Ambulatory Visit (INDEPENDENT_AMBULATORY_CARE_PROVIDER_SITE_OTHER): Payer: Self-pay | Admitting: General Surgery

## 2011-10-07 ENCOUNTER — Other Ambulatory Visit (INDEPENDENT_AMBULATORY_CARE_PROVIDER_SITE_OTHER): Payer: Self-pay | Admitting: Surgery

## 2011-10-10 ENCOUNTER — Telehealth (INDEPENDENT_AMBULATORY_CARE_PROVIDER_SITE_OTHER): Payer: Self-pay | Admitting: General Surgery

## 2011-10-10 NOTE — Telephone Encounter (Signed)
Called on Friday and talk to Dr Allyson Sabal Nurse and gave date for surgery for pt to be on Lovenox bridging for the Coumadin. Pt surgery date is schedule for 11/17/11 @ 12:45

## 2011-11-14 ENCOUNTER — Encounter (HOSPITAL_BASED_OUTPATIENT_CLINIC_OR_DEPARTMENT_OTHER): Payer: Self-pay | Admitting: *Deleted

## 2011-11-14 NOTE — Progress Notes (Signed)
Pt had cabg 2003-had cardiac arrest preop-sees dr berry Cleared for surg-called for notes Pt works full time-never smoked No ht problems since 03 Hx dvt and pe-lovenox ordered Pt understands orders-giving his own inj To come in for labs

## 2011-11-16 ENCOUNTER — Encounter (HOSPITAL_BASED_OUTPATIENT_CLINIC_OR_DEPARTMENT_OTHER)
Admission: RE | Admit: 2011-11-16 | Discharge: 2011-11-16 | Disposition: A | Discharge status: Home / Self Care | Payer: No Typology Code available for payment source | Source: Ambulatory Visit | Attending: Surgery | Admitting: Surgery

## 2011-11-16 LAB — BASIC METABOLIC PANEL
BUN: 15 mg/dL (ref 6–23)
CO2: 27 mEq/L (ref 19–32)
Calcium: 9.2 mg/dL (ref 8.4–10.5)
Chloride: 102 mEq/L (ref 96–112)
Creatinine, Ser: 0.96 mg/dL (ref 0.50–1.35)
GFR calc Af Amer: >90 mL/min (ref >90)
GFR calc non Af Amer: 88 mL/min — ABNORMAL LOW (ref >90)
Glucose, Bld: 126 mg/dL — ABNORMAL HIGH (ref 70–99)
Potassium: 4.1 mEq/L (ref 3.5–5.1)
Sodium: 138 mEq/L (ref 135–145)

## 2011-11-16 LAB — PROTIME-INR
INR: 1.09 (ref 0.00–1.49)
Prothrombin Time: 14.3 seconds (ref 11.6–15.2)

## 2011-11-16 LAB — APTT: aPTT: 37 seconds (ref 24–37)

## 2011-11-16 NOTE — H&P (Signed)
HPI  Eduardo Butler is a 60 y.o. male.  HPIThis is a pleasant gentleman referred by Dr. Clarene Duke her evaluation of an umbilical hernia. He has had the hernia for a least 2 years. It only causes minimal discomfort. He has no tract symptoms. He is otherwise without complaints.  Past Medical History   Diagnosis  Date   .  Anxiety    .  Hyperlipidemia    .  Hypertension    .  Peroneal DVT (deep venous thrombosis)    .  CHF (congestive heart failure)     Past Surgical History   Procedure  Date   .  Hernia repair    .  Shoulder surgery    .  Bypass graft  2003     4 bypass   .  Pelvic laparoscopy  2005    History reviewed. No pertinent family history.  Social History  History   Substance Use Topics   .  Smoking status:  Never Smoker   .  Smokeless tobacco:  Not on file   .  Alcohol Use:  No    No Known Allergies  Current Outpatient Prescriptions   Medication  Sig  Dispense  Refill   .  ALPRAZolam (XANAX XR) 0.5 MG 24 hr tablet  Take 0.5 mg by mouth 2 (two) times daily.     [name]  dexlansoprazole (DEXILANT) 60 MG capsule  Take 60 mg by mouth daily.     [name]  ezetimibe-simvastatin (VYTORIN) 10-40 MG per tablet  Take 1 tablet by mouth at bedtime.     .  folic acid (FOLVITE) 1 MG tablet  Take 1 mg by mouth daily.     .  hydrochlorothiazide (MICROZIDE) 12.5 MG capsule  Take 12.5 mg by mouth daily.     [name]  losartan (COZAAR) 50 MG tablet  Take 50 mg by mouth daily.     .  nebivolol (BYSTOLIC) 5 MG tablet  Take 5 mg by mouth daily.     .  Testosterone (FORTESTA TD)  Place 10 mg onto the skin daily.     [name]  warfarin (COUMADIN) 5 MG tablet  Take 5 mg by mouth daily. M,W,F and Coumadin 4mg  T, Th, S, S     .  tadalafil (CIALIS) 5 MG tablet  Take 5 mg by mouth daily as needed.      Review of Systems  Review of Systems  Constitutional: Negative for fever, chills and unexpected weight change.  HENT: Negative for hearing loss, congestion, sore throat, trouble swallowing and voice change.  Eyes: Negative  for visual disturbance.  Respiratory: Negative for cough and wheezing.  Cardiovascular: Negative for chest pain, palpitations and leg swelling.  Gastrointestinal: Negative for nausea, vomiting, abdominal pain, diarrhea, constipation, blood in stool, abdominal distention, anal bleeding and rectal pain.  Genitourinary: Negative for hematuria and difficulty urinating.  Musculoskeletal: Negative for arthralgias.  Skin: Negative for rash and wound.  Neurological: Negative for seizures, syncope, weakness and headaches.  Hematological: Negative for adenopathy. Does not bruise/bleed easily.  Psychiatric/Behavioral: Negative for confusion.   Blood pressure 122/80, pulse 80, temperature 98.6 F (37 C), temperature source Temporal, resp. rate 18, height 5\' 6"  (1.676 m), weight 242 lb 9.6 oz (110.043 kg).  Physical Exam  Physical Exam  Constitutional: He is oriented to person, place, and time. He appears well-developed and well-nourished. No distress.  HENT:  Head: Normocephalic and atraumatic.  Right Ear: External ear normal.  Left Ear: External ear normal.  Nose: Nose normal.  Mouth/Throat: No oropharyngeal exudate.  Eyes: Conjunctivae are normal. Pupils are equal, round, and reactive to light. Left eye exhibits no discharge. No scleral icterus.  Neck: Normal range of motion. No tracheal deviation present. No thyromegaly present.  Cardiovascular: Normal rate, regular rhythm, normal heart sounds and intact distal pulses. Exam reveals no friction rub.  Pulmonary/Chest: Effort normal and breath sounds normal. No respiratory distress. He has no wheezes.  Abdominal: Soft. Bowel sounds are normal. He exhibits no distension. There is no tenderness. There is no rebound.  There is a moderate sized, reducible, umbilical hernia  Musculoskeletal: Normal range of motion. He exhibits no edema and no tenderness.  Lymphadenopathy:  He has no cervical adenopathy.  Neurological: He is alert and oriented to  person, place, and time.  Skin: Skin is warm and dry. No rash noted. No erythema.  Psychiatric: His behavior is normal. Judgment normal.   Data Reviewed  I have reviewed the notes from Dr. Fredirick Maudlin office  Assessment   Easily reducible umbilical hernia   Plan   I discussed this with him in detail. Repair is recommended with mesh. We discussed his previous history of DVT and pulmonary embolism. He will need to stop his Coumadin 5 days preoperatively. He does not want to be on Lovenox preoperatively and I discussed the risks of this with him. He understands his risk of recurrent DVT. I will give him heparin preoperatively. I discussed the risks of surgery which includes but is not limited to bleeding, infection, injury to surrounding structures, chronic pain, recurrence, bleeding, cardiopulmonary issues, DVT, etc. He understands and wished to proceed. Surgery will be scheduled. There is a good likelihood of success. We will obtain preoperative cardiac clearance from Dr. Edward Jolly A

## 2011-11-17 ENCOUNTER — Encounter (HOSPITAL_BASED_OUTPATIENT_CLINIC_OR_DEPARTMENT_OTHER): Payer: Self-pay | Admitting: *Deleted

## 2011-11-17 ENCOUNTER — Ambulatory Visit (HOSPITAL_BASED_OUTPATIENT_CLINIC_OR_DEPARTMENT_OTHER)
Admission: RE | Admit: 2011-11-17 | Discharge: 2011-11-17 | Disposition: A | Discharge status: Home / Self Care | Payer: No Typology Code available for payment source | Source: Ambulatory Visit | Attending: Surgery | Admitting: Surgery

## 2011-11-17 ENCOUNTER — Ambulatory Visit (HOSPITAL_BASED_OUTPATIENT_CLINIC_OR_DEPARTMENT_OTHER): Payer: No Typology Code available for payment source | Admitting: *Deleted

## 2011-11-17 ENCOUNTER — Encounter (HOSPITAL_BASED_OUTPATIENT_CLINIC_OR_DEPARTMENT_OTHER)
Admission: RE | Disposition: A | Discharge status: Home / Self Care | Payer: Self-pay | Source: Ambulatory Visit | Attending: Surgery

## 2011-11-17 DIAGNOSIS — I509 Heart failure, unspecified: Secondary | ICD-10-CM | POA: Insufficient documentation

## 2011-11-17 DIAGNOSIS — Z01812 Encounter for preprocedural laboratory examination: Secondary | ICD-10-CM | POA: Insufficient documentation

## 2011-11-17 DIAGNOSIS — I10 Essential (primary) hypertension: Secondary | ICD-10-CM | POA: Insufficient documentation

## 2011-11-17 DIAGNOSIS — F411 Generalized anxiety disorder: Secondary | ICD-10-CM | POA: Insufficient documentation

## 2011-11-17 DIAGNOSIS — K429 Umbilical hernia without obstruction or gangrene: Secondary | ICD-10-CM | POA: Insufficient documentation

## 2011-11-17 DIAGNOSIS — I2510 Atherosclerotic heart disease of native coronary artery without angina pectoris: Secondary | ICD-10-CM | POA: Insufficient documentation

## 2011-11-17 HISTORY — DX: Atherosclerotic heart disease of native coronary artery without angina pectoris: I25.10

## 2011-11-17 HISTORY — PX: UMBILICAL HERNIA REPAIR: SHX196

## 2011-11-17 HISTORY — DX: Shortness of breath: R06.02

## 2011-11-17 LAB — POCT HEMOGLOBIN-HEMACUE: Hemoglobin: 14.7 g/dL (ref 13.0–17.0)

## 2011-11-17 SURGERY — REPAIR, HERNIA, UMBILICAL, ADULT
Anesthesia: General

## 2011-11-17 MED ORDER — DEXAMETHASONE SODIUM PHOSPHATE 4 MG/ML IJ SOLN
INTRAMUSCULAR | Status: DC | PRN
  Administered 2011-11-17: 10 mg via INTRAVENOUS

## 2011-11-17 MED ORDER — HYDROCODONE-ACETAMINOPHEN 5-325 MG PO TABS: 1.0000 | ORAL_TABLET | Freq: Four times a day (QID) | ORAL | Status: AC | PRN

## 2011-11-17 MED ORDER — BUPIVACAINE-EPINEPHRINE 0.5% -1:200000 IJ SOLN
INTRAMUSCULAR | Status: DC | PRN
  Administered 2011-11-17: 20 mL

## 2011-11-17 MED ORDER — PROPOFOL 10 MG/ML IV EMUL
INTRAVENOUS | Status: DC | PRN
  Administered 2011-11-17: 150 mg via INTRAVENOUS

## 2011-11-17 MED ORDER — HEPARIN SODIUM (PORCINE) 5000 UNIT/ML IJ SOLN
5000.0000 [IU] | Freq: Once | INTRAMUSCULAR | Status: AC
  Administered 2011-11-17: 5000 [IU] via SUBCUTANEOUS

## 2011-11-17 MED ORDER — ROCURONIUM BROMIDE 100 MG/10ML IV SOLN
INTRAVENOUS | Status: DC | PRN
  Administered 2011-11-17: 30 mg via INTRAVENOUS

## 2011-11-17 MED ORDER — HYDROMORPHONE HCL PF 1 MG/ML IJ SOLN
0.2500 mg | INTRAMUSCULAR | Status: DC | PRN
  Administered 2011-11-17: 0.5 mg via INTRAVENOUS

## 2011-11-17 MED ORDER — ONDANSETRON HCL 4 MG/2ML IJ SOLN
INTRAMUSCULAR | Status: DC | PRN
  Administered 2011-11-17: 4 mg via INTRAVENOUS

## 2011-11-17 MED ORDER — NEOSTIGMINE METHYLSULFATE 1 MG/ML IJ SOLN
INTRAMUSCULAR | Status: DC | PRN
  Administered 2011-11-17: 2.5 mg via INTRAVENOUS

## 2011-11-17 MED ORDER — ONDANSETRON HCL 4 MG/2ML IJ SOLN: 4.0000 mg | Freq: Four times a day (QID) | INTRAMUSCULAR | Status: DC | PRN

## 2011-11-17 MED ORDER — PROMETHAZINE HCL 25 MG/ML IJ SOLN: 12.5000 mg | Freq: Four times a day (QID) | INTRAMUSCULAR | Status: DC | PRN

## 2011-11-17 MED ORDER — LIDOCAINE HCL (CARDIAC) 20 MG/ML IV SOLN
INTRAVENOUS | Status: DC | PRN
  Administered 2011-11-17: 80 mg via INTRAVENOUS

## 2011-11-17 MED ORDER — CEPHALEXIN 500 MG PO CAPS: 500.0000 mg | ORAL_CAPSULE | Freq: Three times a day (TID) | ORAL | Status: AC

## 2011-11-17 MED ORDER — FENTANYL CITRATE 0.05 MG/ML IJ SOLN
INTRAMUSCULAR | Status: DC | PRN
Start: 2011-11-17 — End: 2011-11-17
  Administered 2011-11-17: 25 ug via INTRAVENOUS
  Administered 2011-11-17: 50 ug via INTRAVENOUS

## 2011-11-17 MED ORDER — GLYCOPYRROLATE 0.2 MG/ML IJ SOLN
INTRAMUSCULAR | Status: DC | PRN
  Administered 2011-11-17: .5 mg via INTRAVENOUS

## 2011-11-17 MED ORDER — LACTATED RINGERS IV SOLN
INTRAVENOUS | Status: DC
  Administered 2011-11-17: 12:00:00 via INTRAVENOUS

## 2011-11-17 MED ORDER — CEFAZOLIN SODIUM-DEXTROSE 2-3 GM-% IV SOLR
2.0000 g | INTRAVENOUS | Status: AC
  Administered 2011-11-17: 2 g via INTRAVENOUS

## 2011-11-17 SURGICAL SUPPLY — 44 items
APL SKNCLS STERI-STRIP NONHPOA (GAUZE/BANDAGES/DRESSINGS) ×1
BENZOIN TINCTURE PRP APPL 2/3 (GAUZE/BANDAGES/DRESSINGS) ×2 IMPLANT
BLADE SURG 15 STRL LF DISP TIS (BLADE) ×1 IMPLANT
BLADE SURG 15 STRL SS (BLADE) ×2
BLADE SURG ROTATE 9660 (MISCELLANEOUS) ×1 IMPLANT
CANISTER SUCTION 1200CC (MISCELLANEOUS) ×1 IMPLANT
CHLORAPREP W/TINT 26ML (MISCELLANEOUS) ×2 IMPLANT
CLEANER CAUTERY TIP 5X5 PAD (MISCELLANEOUS) ×1 IMPLANT
CLOTH BEACON ORANGE TIMEOUT ST (SAFETY) ×2 IMPLANT
COVER MAYO STAND STRL (DRAPES) ×2 IMPLANT
COVER TABLE BACK 60X90 (DRAPES) ×2 IMPLANT
DECANTER SPIKE VIAL GLASS SM (MISCELLANEOUS) IMPLANT
DRAPE PED LAPAROTOMY (DRAPES) ×2 IMPLANT
DRAPE UTILITY XL STRL (DRAPES) ×2 IMPLANT
DRSG TEGADERM 2-3/8X2-3/4 SM (GAUZE/BANDAGES/DRESSINGS) IMPLANT
DRSG TEGADERM 4X4.75 (GAUZE/BANDAGES/DRESSINGS) ×2 IMPLANT
ELECT REM PT RETURN 9FT ADLT (ELECTROSURGICAL) ×2
ELECTRODE REM PT RTRN 9FT ADLT (ELECTROSURGICAL) ×1 IMPLANT
GAUZE SPONGE 4X4 12PLY STRL LF (GAUZE/BANDAGES/DRESSINGS) ×1 IMPLANT
GLOVE SURG SIGNA 7.5 PF LTX (GLOVE) ×2 IMPLANT
GOWN PREVENTION PLUS XLARGE (GOWN DISPOSABLE) ×2 IMPLANT
GOWN PREVENTION PLUS XXLARGE (GOWN DISPOSABLE) ×2 IMPLANT
NDL HYPO 25X1 1.5 SAFETY (NEEDLE) ×1 IMPLANT
NEEDLE HYPO 25X1 1.5 SAFETY (NEEDLE) ×2 IMPLANT
NS IRRIG 1000ML POUR BTL (IV SOLUTION) ×1 IMPLANT
PACK BASIN DAY SURGERY FS (CUSTOM PROCEDURE TRAY) ×2 IMPLANT
PAD CLEANER CAUTERY TIP 5X5 (MISCELLANEOUS) ×1
PATCH VENTRAL SMALL 4.3 (Mesh Specialty) ×1 IMPLANT
PENCIL BUTTON HOLSTER BLD 10FT (ELECTRODE) ×2 IMPLANT
SLEEVE SCD COMPRESS KNEE MED (MISCELLANEOUS) ×2 IMPLANT
SPONGE LAP 4X18 X RAY DECT (DISPOSABLE) ×1 IMPLANT
STRIP CLOSURE SKIN 1/2X4 (GAUZE/BANDAGES/DRESSINGS) ×2 IMPLANT
SUT MNCRL AB 4-0 PS2 18 (SUTURE) ×2 IMPLANT
SUT NOVA NAB DX-16 0-1 5-0 T12 (SUTURE) ×4 IMPLANT
SUT VIC AB 2-0 SH 27 (SUTURE)
SUT VIC AB 2-0 SH 27XBRD (SUTURE) IMPLANT
SUT VIC AB 3-0 SH 27 (SUTURE) ×4
SUT VIC AB 3-0 SH 27X BRD (SUTURE) ×2 IMPLANT
SYR CONTROL 10ML LL (SYRINGE) ×2 IMPLANT
TOWEL OR 17X24 6PK STRL BLUE (TOWEL DISPOSABLE) ×2 IMPLANT
TOWEL OR NON WOVEN STRL DISP B (DISPOSABLE) ×2 IMPLANT
TUBE CONNECTING 20X1/4 (TUBING) ×1 IMPLANT
WATER STERILE IRR 1000ML POUR (IV SOLUTION) ×1 IMPLANT
YANKAUER SUCT BULB TIP NO VENT (SUCTIONS) ×1 IMPLANT

## 2011-11-17 NOTE — Anesthesia Postprocedure Evaluation (Signed)
Anesthesia Post Note  Patient: Eduardo Butler  Procedure(s) Performed:  HERNIA REPAIR UMBILICAL ADULT - umbilical hernia repair with mesh  Anesthesia type: General  Patient location: PACU  Post pain: Pain level controlled and Adequate analgesia  Post assessment: Post-op Vital signs reviewed, Patient's Cardiovascular Status Stable, Respiratory Function Stable, Patent Airway and Pain level controlled  Last Vitals:  Filed Vitals:   11/17/11 1343  BP: 112/67  Pulse: 67  Temp:   Resp:     Post vital signs: Reviewed and stable  Level of consciousness: awake, alert  and oriented  Complications: No apparent anesthesia complications

## 2011-11-17 NOTE — Anesthesia Procedure Notes (Signed)
Procedure Name: Intubation Date/Time: 11/17/2011 1:07 PM Performed by: Meyer Russel Pre-anesthesia Checklist: Patient identified, Emergency Drugs available, Suction available, Patient being monitored and Timeout performed Patient Re-evaluated:Patient Re-evaluated prior to inductionOxygen Delivery Method: Circle System Utilized Preoxygenation: Pre-oxygenation with 100% oxygen Intubation Type: IV induction Ventilation: Mask ventilation without difficulty and Oral airway inserted - appropriate to patient size Laryngoscope Size: Miller and 2 Grade View: Grade II Tube type: Oral Tube size: 7.0 mm Number of attempts: 1 Airway Equipment and Method: stylet and oral airway Placement Confirmation: ETT inserted through vocal cords under direct vision,  positive ETCO2 and breath sounds checked- equal and bilateral Secured at: 22 cm Tube secured with: Tape Dental Injury: Teeth and Oropharynx as per pre-operative assessment

## 2011-11-17 NOTE — Interval H&P Note (Signed)
History and Physical Interval Note: he has had no change in his medical history or in his exam  11/17/2011 11:12 AM  Eduardo Butler  has presented today for surgery, with the diagnosis of umbilical hernia  The various methods of treatment have been discussed with the patient and family. After consideration of risks, benefits and other options for treatment, the patient has consented to  Procedure(s): HERNIA REPAIR UMBILICAL ADULT WITH POSSIBLE MESH as a surgical intervention .  The patients' history has been reviewed, patient examined, no change in status, stable for surgery.  I have reviewed the patients' chart and labs.  Questions were answered to the patient's satisfaction.     Othniel Maret A

## 2011-11-17 NOTE — Transfer of Care (Signed)
Immediate Anesthesia Transfer of Care Note  Patient: Eduardo Butler  Procedure(s) Performed:  HERNIA REPAIR UMBILICAL ADULT - umbilical hernia repair with mesh  Patient Location: PACU  Anesthesia Type: General  Level of Consciousness: awake, alert , sedated and patient cooperative  Airway & Oxygen Therapy: Patient Spontanous Breathing and Patient connected to face mask oxygen  Post-op Assessment: Report given to PACU RN, Post -op Vital signs reviewed and stable and Patient moving all extremities  Post vital signs: Reviewed and stable  Complications: No apparent anesthesia complications

## 2011-11-17 NOTE — Progress Notes (Signed)
Called prescriptions into [facility] in [place]-pharmacist John to fill-Norco 1-2 tablets every 6 hours as needed for pain (30 tablets)(1 refill) and Keflex 500 mg 1 capsule by mouth 3 x per day-(#21).

## 2011-11-17 NOTE — Anesthesia Preprocedure Evaluation (Addendum)
Anesthesia Evaluation  Patient identified by MRN, date of birth, ID band Patient awake    Reviewed: Allergy & Precautions, H&P , NPO status , Patient's Chart, lab work & pertinent test results  Airway Mallampati: II  Neck ROM: full    Dental   Pulmonary shortness of breath,          Cardiovascular hypertension, + CAD and +CHF     Neuro/Psych Anxiety    GI/Hepatic   Endo/Other    Renal/GU      Musculoskeletal   Abdominal   Peds  Hematology   Anesthesia Other Findings   Reproductive/Obstetrics                           Anesthesia Physical Anesthesia Plan  ASA: III  Anesthesia Plan: General   Post-op Pain Management:    Induction: Intravenous  Airway Management Planned: Oral ETT  Additional Equipment:   Intra-op Plan:   Post-operative Plan: Extubation in OR  Informed Consent: I have reviewed the patients History and Physical, chart, labs and discussed the procedure including the risks, benefits and alternatives for the proposed anesthesia with the patient or authorized representative who has indicated his/her understanding and acceptance.   Dental Advisory Given  Plan Discussed with: CRNA and Surgeon  Anesthesia Plan Comments:        Anesthesia Quick Evaluation

## 2011-11-17 NOTE — Op Note (Signed)
11/17/2011  1:36 PM  PATIENT:  Eduardo Butler  60 y.o. male  PRE-OPERATIVE DIAGNOSIS:  umbilical hernia  POST-OPERATIVE DIAGNOSIS:  umbilical hernia  PROCEDURE:  Procedure(s): HERNIA REPAIR UMBILICAL ADULT WITH MESH (4.3 CM ROUND V-PATCH)  SURGEON:  Surgeon(s): Shelly Rubenstein, MD  PHYSICIAN ASSISTANT:   ASSISTANTS: none   ANESTHESIA:   local and general  EBL:  Total I/O In: 800 [I.V.:800] Out: -   BLOOD ADMINISTERED:none  DRAINS: none   LOCAL MEDICATIONS USED:  MARCAINE 20CC  SPECIMEN:  No Specimen  DISPOSITION OF SPECIMEN:  N/A  COUNTS:  YES  TOURNIQUET:  * No tourniquets in log *  DICTATION: .Dragon Dictation The patient was brought to the operating room and identified as the correct patient. He was placed upon on the operating table and general anesthesia was induced. His abdomen was then prepped and draped in the usual sterile fashion. Using a scalpel a small transverse incision was made at the lower edge of the umbilicus. This was carried down to the fascia and umbilical hernia sac which was separated from the overlying umbilical skin. I then excised the sac. There was nothing incarcerated in the sac. I then brought a 4.3 cm round V. Patch Prolene patch onto the field. I placed it through the fascial opening and then tacked into the fascia and peritoneum with interrupted 0 Novafil sutures. I was unable to close fascia overtop the mesh with a figure-of-eight Novafil sutures well. Good coverage of the fascial defect appeared to be achieved. I then anesthetized the fascia circumferentially as well as the skin with Marcaine. I tacked the umbilical skin back in place with 3-0 Vicryl sutures. I then closed the subcutaneous tissue with interrupted 3-0 Vicryl sutures and closed the skin with a running 4-0 Monocryl. Steri-Strips gauze and tape were applied. The patient tolerated it well. All counts were correct at the end of the procedure. The patient was then extubated in the  operating room and taken in stable condition to the recovery room. PLAN OF CARE: Discharge to home after PACU  PATIENT DISPOSITION:  PACU - hemodynamically stable.   Delay start of Pharmacological VTE agent (>24hrs) due to surgical blood loss or risk of bleeding:  {YES/NO/NOT APPLICABLE:20182

## 2011-11-18 ENCOUNTER — Encounter (HOSPITAL_BASED_OUTPATIENT_CLINIC_OR_DEPARTMENT_OTHER): Payer: Self-pay | Admitting: Surgery

## 2011-12-08 ENCOUNTER — Encounter (INDEPENDENT_AMBULATORY_CARE_PROVIDER_SITE_OTHER): Payer: Self-pay | Admitting: Surgery

## 2011-12-08 ENCOUNTER — Ambulatory Visit (INDEPENDENT_AMBULATORY_CARE_PROVIDER_SITE_OTHER): Payer: No Typology Code available for payment source | Admitting: Surgery

## 2011-12-08 VITALS — BP 132/78 | HR 70 | Temp 98.0°F | Resp 18 | Ht 66.0 in | Wt 245.2 lb

## 2011-12-08 DIAGNOSIS — Z09 Encounter for follow-up examination after completed treatment for conditions other than malignant neoplasm: Secondary | ICD-10-CM

## 2011-12-08 NOTE — Progress Notes (Signed)
Subjective:     Patient ID: Eduardo Butler, male   DOB: 1951-03-06, 61 y.o.   MRN: [id number]  HPI He is here for his first postoperative visit status post umbilical hernia repair with mesh. He is doing well and has no complaints.  Review of Systems     Objective:   Physical Exam On exam, his incision is healing well. There is no evidence of infection or recurrence    Assessment:     Patient status post umbilical hernia repair with mesh    Plan:     He will refrain from heavy lifting for one more week. He will see me back as needed.

## 2012-02-09 ENCOUNTER — Encounter: Payer: Self-pay | Admitting: Internal Medicine

## 2012-02-09 ENCOUNTER — Ambulatory Visit (INDEPENDENT_AMBULATORY_CARE_PROVIDER_SITE_OTHER): Payer: No Typology Code available for payment source | Admitting: Internal Medicine

## 2012-02-09 VITALS — BP 106/60 | HR 55 | Temp 97.5°F | Ht 66.0 in | Wt 246.0 lb

## 2012-02-09 DIAGNOSIS — R0602 Shortness of breath: Secondary | ICD-10-CM
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
## 2012-02-09 NOTE — Patient Instructions (Signed)
Prilosec 20mg  x 2 Take 30-60 min before first meal of the day and Pepcid 20mg  one at bedtime for at least a month  GERD (REFLUX)  is an extremely common cause of respiratory symptoms (like yours!), many times with no significant heartburn at all.    It can be treated with medication, but also with lifestyle changes including avoidance of late meals, excessive alcohol, smoking cessation, and avoid fatty foods, chocolate, peppermint, colas, red wine, and acidic juices such as orange juice.  NO MINT OR MENTHOL PRODUCTS SO NO COUGH DROPS  USE SUGARLESS CANDY INSTEAD (jolley ranchers or Stover's)  NO OIL BASED VITAMINS - use powdered substitutes.    Return in 4 weeks if not 100% satisfied with full pft's on return

## 2012-02-10 ENCOUNTER — Encounter: Payer: Self-pay | Admitting: Cardiovascular Disease

## 2012-02-10 NOTE — Assessment & Plan Note (Addendum)
Symptoms are markedly disproportionate to objective findings (and not sob not proportionate to aerobic acitivity as can no non wt bearing aerobics s difficulty  and not clear this is even a lung problem but pt does appear to have difficult airway management issues.  DDX of  difficult airways managment all start with A and  include Adherence, Ace Inhibitors, Acid Reflux, Active Sinus Disease, Alpha 1 Antitripsin deficiency, Anxiety masquerading as Airways dz,  ABPA,  allergy(esp in young), Aspiration (esp in elderly), Adverse effects of DPI,  Active smokers, plus two Bs  = Bronchiectasis and Beta blocker use..and one C= CHF  ? Acid reflux triggered by uri/cough.  Explained natural history of uri and why it's necessary in patients at risk to treat GERD aggressively  at least  short term   to reduce risk of evolving cyclical cough initially  triggered by epithelial injury and a heightened sensitivty to the effects of any upper airway irritants,  most importantly acid - related.  That is, the more sensitive the epithelium damaged for virus, the more the cough, the more the secondary reflux (especially in those prone to reflux) the more the irritation of the sensitive mucosa and so on in a cyclical pattern.  ? B blocker effect > extremely unlikely on bystolic, the most beta 1 specific BBlocker available and no evidence that this is asthma anyway For now max rx gerd then regroup ? Needs cpst to sort out if not improved.

## 2012-06-05 ENCOUNTER — Other Ambulatory Visit (HOSPITAL_COMMUNITY): Payer: Self-pay | Admitting: Gastroenterology

## 2012-06-05 DIAGNOSIS — R109 Unspecified abdominal pain: Secondary | ICD-10-CM

## 2012-06-12 ENCOUNTER — Encounter (HOSPITAL_COMMUNITY)
Admission: RE | Admit: 2012-06-12 | Discharge: 2012-06-12 | Disposition: A | Discharge status: Home / Self Care | Payer: No Typology Code available for payment source | Source: Ambulatory Visit | Attending: Gastroenterology | Admitting: Gastroenterology

## 2012-06-12 DIAGNOSIS — R109 Unspecified abdominal pain: Secondary | ICD-10-CM | POA: Insufficient documentation

## 2012-06-12 MED ORDER — TECHNETIUM TC 99M SULFUR COLLOID
2.0000 | Freq: Once | INTRAVENOUS | Status: AC | PRN
  Administered 2012-06-12: 2 via INTRAVENOUS

## 2012-06-13 ENCOUNTER — Other Ambulatory Visit (HOSPITAL_COMMUNITY): Payer: Self-pay | Admitting: Gastroenterology

## 2012-06-13 DIAGNOSIS — IMO0001 Reserved for inherently not codable concepts without codable children: Secondary | ICD-10-CM

## 2012-06-13 DIAGNOSIS — R1013 Epigastric pain: Secondary | ICD-10-CM

## 2012-06-25 ENCOUNTER — Encounter (HOSPITAL_COMMUNITY)
Admission: RE | Admit: 2012-06-25 | Discharge: 2012-06-25 | Disposition: A | Discharge status: Home / Self Care | Payer: No Typology Code available for payment source | Source: Ambulatory Visit | Attending: Gastroenterology | Admitting: Gastroenterology

## 2012-06-25 DIAGNOSIS — K219 Gastro-esophageal reflux disease without esophagitis: Secondary | ICD-10-CM | POA: Insufficient documentation

## 2012-06-25 DIAGNOSIS — IMO0001 Reserved for inherently not codable concepts without codable children: Secondary | ICD-10-CM

## 2012-06-25 DIAGNOSIS — R1013 Epigastric pain: Secondary | ICD-10-CM | POA: Insufficient documentation

## 2012-06-25 MED ORDER — SINCALIDE 5 MCG IJ SOLR
INTRAMUSCULAR | Status: AC
  Administered 2012-06-25: 2.05 ug via INTRAVENOUS
  Filled 2012-06-25: qty 5

## 2012-06-25 MED ORDER — SINCALIDE 5 MCG IJ SOLR
0.0200 ug/kg | Freq: Once | INTRAMUSCULAR | Status: AC
  Administered 2012-06-25: 2.05 ug via INTRAVENOUS

## 2012-06-25 MED ORDER — TECHNETIUM TC 99M MEBROFENIN IV KIT
5.0000 | PACK | Freq: Once | INTRAVENOUS | Status: AC | PRN
  Administered 2012-06-25: 5 via INTRAVENOUS

## 2012-12-24 ENCOUNTER — Encounter: Payer: Self-pay | Admitting: Cardiovascular Disease

## 2012-12-31 ENCOUNTER — Other Ambulatory Visit (HOSPITAL_COMMUNITY): Payer: Self-pay | Admitting: Cardiovascular Disease

## 2012-12-31 DIAGNOSIS — I2510 Atherosclerotic heart disease of native coronary artery without angina pectoris: Secondary | ICD-10-CM

## 2013-01-04 ENCOUNTER — Ambulatory Visit (HOSPITAL_COMMUNITY)
Admission: RE | Admit: 2013-01-04 | Discharge: 2013-01-04 | Disposition: A | Discharge status: Home / Self Care | Payer: No Typology Code available for payment source | Source: Ambulatory Visit | Attending: Cardiovascular Disease | Admitting: Cardiovascular Disease

## 2013-01-04 DIAGNOSIS — R42 Dizziness and giddiness: Secondary | ICD-10-CM | POA: Insufficient documentation

## 2013-01-04 DIAGNOSIS — R0602 Shortness of breath: Secondary | ICD-10-CM | POA: Insufficient documentation

## 2013-01-04 DIAGNOSIS — I2510 Atherosclerotic heart disease of native coronary artery without angina pectoris: Secondary | ICD-10-CM

## 2013-01-04 DIAGNOSIS — Z9289 Personal history of other medical treatment: Secondary | ICD-10-CM

## 2013-01-04 DIAGNOSIS — R079 Chest pain, unspecified: Secondary | ICD-10-CM | POA: Insufficient documentation

## 2013-01-04 DIAGNOSIS — I10 Essential (primary) hypertension: Secondary | ICD-10-CM | POA: Insufficient documentation

## 2013-01-04 HISTORY — DX: Personal history of other medical treatment: Z92.89

## 2013-01-04 MED ORDER — TECHNETIUM TC 99M SESTAMIBI GENERIC - CARDIOLITE: 10.9000 | Freq: Once | INTRAVENOUS | Status: AC | PRN

## 2013-01-04 MED ORDER — TECHNETIUM TC 99M SESTAMIBI GENERIC - CARDIOLITE: 30.2000 | Freq: Once | INTRAVENOUS | Status: AC | PRN

## 2013-01-04 NOTE — Procedures (Addendum)
[facility] [address] [place] [address] [phone]  Cardiology Nuclear Med Study  Eduardo Butler is a 62 y.o. male     MRN : [id number]     DOB: November 13, 1951  Procedure Date: 01/04/2013  Nuclear Med Background Indication for Stress Test:  Graft Patency and Abnormal EKG History:  CABG x 4 2003 Cardiac Risk Factors: Hypertension, Lipids and Overweight  Symptoms:  Chest Pain, Dizziness and SOB   Nuclear Pre-Procedure Caffeine/Decaff Intake:  7:00pm NPO After: 5:00am   IV Site: R Antecubital  IV 0.9% NS with Angio Cath:  22g  Chest Size (in):  48 IV Started by: Koren Shiver, CNMT  Height: 5\' 6"  (1.676 m)  Cup Size: n/a  BMI:  Body mass index is 40.03 kg/(m^2). Weight:  248 lb (112.492 kg)   Tech Comments:  Pt held bystolic 24 hrs prior to testing    Nuclear Med Study 1 or 2 day study: 1 day  Stress Test Type:  Stress  Order Authorizing Provider:  Nanetta Batty, MD   Resting Radionuclide: Technetium 65m Sestamibi  Resting Radionuclide Dose: 10.9 mCi   Stress Radionuclide:  Technetium 61m Sestamibi  Stress Radionuclide Dose: 30.2 mCi           Stress Protocol Rest HR: 67 Stress HR: 157  Rest BP: 109/83 Stress BP: 184/67  Exercise Time (min): 8:07 METS: 10.4    Predicted Max HR: 159 bpm % Max HR: 98.74 bpm Rate Pressure Product: 46962   Dose of Adenosine (mg):  n/a Dose of Lexiscan: n/a mg  Dose of Atropine (mg): n/a Dose of Dobutamine: n/a mcg/kg/min (at max HR)  Stress Test Technologist: Esperanza Sheets, CCT Nuclear Technologist: Gonzella Lex, CNMT   Rest Procedure:  Myocardial perfusion imaging was performed at rest 45 minutes following the intravenous administration of Technetium 39m Sestamibi. Stress Procedure:  The patient performed treadmill exercise using a Bruce  Protocol for 8:07 minutes and achieved 10.4 METS. The patient stopped due to Marked SOB and Fatigue, Ventricular Ectopy was present and  denied any chest pain.  There were no significant ST-T wave changes.  Technetium 68m Sestamibi was injected at peak exercise and myocardial perfusion imaging was performed after a brief delay.  Transient Ischemic Dilatation (Normal <1.22):  0.92 Lung/Heart Ratio (Normal <0.45):  0.45 QGS EDV:  124 ml QGS ESV:  57 ml LV Ejection Fraction: 54%  Signed by       Rest ECG: NSR - Normal EKG  Stress ECG: There are scattered PVCs.  QPS Raw Data Images:  Normal; no motion artifact; normal heart/lung ratio. Stress Images:  Normal homogeneous uptake in all areas of the myocardium. Rest Images:  Normal homogeneous uptake in all areas of the myocardium. Subtraction (SDS):  No evidence of ischemia.  Impression Exercise Capacity:  Good exercise capacity. BP Response:  Normal blood pressure response. Clinical Symptoms:  No significant symptoms noted. ECG Impression:  No significant ST segment change suggestive of ischemia. Comparison with Prior Nuclear Study: No significant change from previous study  Overall Impression:  Normal stress nuclear study.  LV Wall Motion:  NL LV Function; NL Wall Motion   [name], MD  01/04/2013 5:34 PM

## 2013-04-27 ENCOUNTER — Telehealth: Payer: Self-pay | Admitting: Cardiovascular Disease

## 2013-04-27 NOTE — Telephone Encounter (Signed)
Eduardo Butler will call to schedule

## 2013-07-02 ENCOUNTER — Other Ambulatory Visit: Payer: Self-pay | Admitting: *Deleted

## 2013-07-02 ENCOUNTER — Other Ambulatory Visit: Payer: Self-pay

## 2013-07-02 MED ORDER — NEBIVOLOL HCL 5 MG PO TABS: 5.0000 mg | ORAL_TABLET | Freq: Every day | ORAL | Status: DC

## 2013-07-02 MED ORDER — EZETIMIBE 10 MG PO TABS: 10.0000 mg | ORAL_TABLET | Freq: Every day | ORAL | Status: DC

## 2013-07-02 NOTE — Telephone Encounter (Signed)
Rx faxed to pharmacy  

## 2013-08-04 ENCOUNTER — Other Ambulatory Visit (HOSPITAL_COMMUNITY): Payer: Self-pay | Admitting: Cardiovascular Disease

## 2013-08-06 NOTE — Telephone Encounter (Signed)
Rx was sent to pharmacy electronically. 

## 2013-10-31 ENCOUNTER — Other Ambulatory Visit (HOSPITAL_COMMUNITY): Payer: Self-pay | Admitting: Cardiovascular Disease

## 2013-11-01 NOTE — Telephone Encounter (Signed)
Rx was sent to pharmacy electronically. 

## 2014-02-27 ENCOUNTER — Encounter: Payer: Self-pay | Admitting: *Deleted

## 2014-03-03 ENCOUNTER — Ambulatory Visit (INDEPENDENT_AMBULATORY_CARE_PROVIDER_SITE_OTHER): Payer: BC Managed Care – PPO | Admitting: Cardiovascular Disease

## 2014-03-03 ENCOUNTER — Encounter: Payer: Self-pay | Admitting: Cardiovascular Disease

## 2014-03-03 VITALS — BP 116/72 | HR 59 | Ht 66.0 in | Wt 241.9 lb

## 2014-03-03 DIAGNOSIS — I10 Essential (primary) hypertension: Secondary | ICD-10-CM | POA: Insufficient documentation

## 2014-03-03 DIAGNOSIS — I2510 Atherosclerotic heart disease of native coronary artery without angina pectoris: Secondary | ICD-10-CM

## 2014-03-03 DIAGNOSIS — E785 Hyperlipidemia, unspecified: Secondary | ICD-10-CM

## 2014-03-03 DIAGNOSIS — I2699 Other pulmonary embolism without acute cor pulmonale: Secondary | ICD-10-CM

## 2014-03-03 NOTE — Progress Notes (Signed)
03/03/2014 Eduardo Butler   12/15/1950  [id number]  Primary Physician Gennette Pac, MD Primary Cardiologist: Lorretta Harp MD [name]    HPI:  The patient is a very pleasant 63 year old moderately overweight married Caucasian male, father of 56, grandfather to 2 grandchildren who I last saw in the office 10 months ago. He has a history of CAD status post coronary artery bypass grafting x4 in March of 2003. 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 [name] follows. His other problems include hyperlipidemia and erectile dysfunction. I catheterized him at the [facility] August 17, 2006 revealing patent grafts with normal LV function. His last Myoview performed in February of last year or was nonischemic Dr. [name] follows his lipid profile.he has been asymptomatic since I saw him 12/24/12.   Current Outpatient Prescriptions  Medication Sig Dispense Refill  . ALPRAZolam (XANAX XR) 0.5 MG 24 hr tablet Take 0.5 mg by mouth as needed.       [name] aspirin 81 MG tablet Take 81 mg by mouth daily.       [name] ezetimibe (ZETIA) 10 MG tablet Take 1 tablet (10 mg total) by mouth daily.  90 tablet  1  . ezetimibe-simvastatin (VYTORIN) 10-40 MG per tablet Take 1 tablet by mouth at bedtime.      . folic acid (FOLVITE) 1 MG tablet Take 1 mg by mouth daily.        . hydrochlorothiazide (MICROZIDE) 12.5 MG capsule Take 25 mg by mouth daily.       [name] losartan (COZAAR) 50 MG tablet TAKE 1 TABLET BY MOUTH EVERY DAY  30 tablet  1  . metFORMIN (GLUCOPHAGE) 500 MG tablet Take 500 mg by mouth daily.      . nebivolol (BYSTOLIC) 5 MG tablet Take 1 tablet (5 mg total) by mouth daily.  30 tablet  1  . omeprazole (PRILOSEC OTC) 20 MG tablet Take 20 mg by mouth daily.      [name] warfarin (COUMADIN) 5 MG tablet Take 5 mg by mouth daily. M,W,F and Coumadin 4mg  T, Th, S, S       . [DISCONTINUED] dexlansoprazole  (DEXILANT) 60 MG capsule Take 60 mg by mouth as needed.       . [DISCONTINUED] tadalafil (CIALIS) 5 MG tablet Take 5 mg by mouth as needed.        No current facility-administered medications for this visit.    No Known Allergies  History   Social History  . Marital Status: Widowed    Spouse Name: N/A    Number of Children: N/A  . Years of Education: N/A   Occupational History  . Not on file.   Social History Main Topics  . Smoking status: Never Smoker   . Smokeless tobacco: Never Used  . Alcohol Use: No     Comment: none since 1979  . Drug Use: No  . Sexual Activity: Not on file   Other Topics Concern  . Not on file   Social History Narrative  . No narrative on file     Review of Systems: General: negative for chills, fever, night sweats or weight changes.  Cardiovascular: negative for chest pain, dyspnea on exertion, edema, orthopnea, palpitations, paroxysmal nocturnal dyspnea or shortness of breath Dermatological: negative for rash Respiratory: negative for cough or wheezing Urologic: negative for hematuria Abdominal: negative for nausea, vomiting, diarrhea, bright red blood per rectum, melena, or  hematemesis Neurologic: negative for visual changes, syncope, or dizziness All other systems reviewed and are otherwise negative except as noted above.    Blood pressure 116/72, pulse 59, height 5\' 6"  (1.676 m), weight 241 lb 14.4 oz (109.725 kg).  General appearance: alert and no distress Neck: no adenopathy, no carotid bruit, no JVD, supple, symmetrical, trachea midline and thyroid not enlarged, symmetric, no tenderness/mass/nodules Lungs: clear to auscultation bilaterally Heart: regular rate and rhythm, S1, S2 normal, no murmur, click, rub or gallop Extremities: extremities normal, atraumatic, no cyanosis or edema  EKG sinus bradycardia at 59 without ST or T wave changes  ASSESSMENT AND PLAN:   Coronary artery disease Status post coronary artery bypass  grafting x4 in March of 2003. He underwent cardiac catheterization by myself 08/25/06 revealing patent grafts with normal LV function. He had a Myoview stress test performed earlier last year that was nonischemic.[name] He denies chest pain or shortness of breath.  Essential hypertension Controlled on current medications  HYPERLIPIDEMIA On statin therapy followed by his PCP  PULMONARY EMBOLISM AND INFARCTION History of lupus anticoagulant. He is on Coumadin followed by his PCP      Lorretta Harp MD [facility], [facility] 03/03/2014 4:04 PM

## 2014-03-03 NOTE — Patient Instructions (Signed)
Your physician recommends that you schedule a follow-up appointment in: 1 year  

## 2014-03-03 NOTE — Assessment & Plan Note (Signed)
On statin therapy followed by his PCP 

## 2014-03-03 NOTE — Assessment & Plan Note (Signed)
Controlled on current medications 

## 2014-03-03 NOTE — Assessment & Plan Note (Signed)
Status post coronary artery bypass grafting x4 in March of 2003. He underwent cardiac catheterization by myself 08/25/06 revealing patent grafts with normal LV function. He had a Myoview stress test performed earlier last year that was nonischemic.[name] He denies chest pain or shortness of breath.

## 2014-03-03 NOTE — Assessment & Plan Note (Signed)
History of lupus anticoagulant. He is on Coumadin followed by his PCP

## 2014-03-07 ENCOUNTER — Other Ambulatory Visit: Payer: Self-pay

## 2014-03-07 MED ORDER — SIMVASTATIN 40 MG PO TABS: 40.0000 mg | ORAL_TABLET | Freq: Every day | ORAL | Status: DC

## 2014-03-07 MED ORDER — NEBIVOLOL HCL 5 MG PO TABS: 5.0000 mg | ORAL_TABLET | Freq: Every day | ORAL | Status: DC

## 2014-03-07 NOTE — Telephone Encounter (Signed)
Per Dr Kennon Holter last office note he was taking Vytorin 10-40. Received medication refill from [facility] for Simvastatin.  Called patient to verify medications. Patient stated he takes Simvastatin 40 mg and Zetia 10 mg separately - change made in EPIC.  Notified patient that I would send in refills for Simvastatin, then patient requested generic Bystolic be sent in as well.   Refills faxed to pharmacy.

## 2014-03-12 ENCOUNTER — Other Ambulatory Visit: Payer: Self-pay | Admitting: *Deleted

## 2014-03-12 NOTE — Telephone Encounter (Signed)
Refills faxed to [facility] for Simvastatin 40mg  #92 and Bystolic 5mg  qd #90 with 3 refills.

## 2014-05-19 ENCOUNTER — Other Ambulatory Visit: Payer: Self-pay

## 2014-05-19 MED ORDER — LOSARTAN POTASSIUM 50 MG PO TABS: 50.0000 mg | ORAL_TABLET | Freq: Every day | ORAL | Status: DC

## 2014-05-19 NOTE — Telephone Encounter (Signed)
Rx was sent to pharmacy electronically. 

## 2014-06-03 ENCOUNTER — Encounter (INDEPENDENT_AMBULATORY_CARE_PROVIDER_SITE_OTHER): Payer: Self-pay

## 2014-06-03 ENCOUNTER — Ambulatory Visit
Admission: RE | Admit: 2014-06-03 | Discharge: 2014-06-03 | Disposition: A | Discharge status: Home / Self Care | Payer: BC Managed Care – PPO | Source: Ambulatory Visit | Attending: Family Medicine | Admitting: Family Medicine

## 2014-06-03 ENCOUNTER — Other Ambulatory Visit: Payer: Self-pay | Admitting: Family Medicine

## 2014-06-03 DIAGNOSIS — M545 Low back pain, unspecified: Secondary | ICD-10-CM

## 2014-06-04 ENCOUNTER — Other Ambulatory Visit: Payer: Self-pay | Admitting: Family Medicine

## 2014-06-04 DIAGNOSIS — M5416 Radiculopathy, lumbar region: Secondary | ICD-10-CM

## 2014-06-08 ENCOUNTER — Ambulatory Visit
Admission: RE | Admit: 2014-06-08 | Discharge: 2014-06-08 | Disposition: A | Discharge status: Home / Self Care | Payer: BC Managed Care – PPO | Source: Ambulatory Visit | Attending: Family Medicine | Admitting: Family Medicine

## 2014-06-08 DIAGNOSIS — M5416 Radiculopathy, lumbar region: Secondary | ICD-10-CM

## 2014-06-25 ENCOUNTER — Telehealth: Payer: Self-pay | Admitting: Cardiovascular Disease

## 2014-06-25 NOTE — Telephone Encounter (Signed)
Need clarence for back surgery. Dr Leda Min office said they have faxed it over twice. They need this back asap.,,

## 2014-06-25 NOTE — Telephone Encounter (Signed)
Will forward for dr berry review

## 2014-06-26 ENCOUNTER — Encounter: Payer: Self-pay | Admitting: *Deleted

## 2014-06-26 ENCOUNTER — Other Ambulatory Visit: Payer: Self-pay | Admitting: Neurosurgery

## 2014-06-26 NOTE — Telephone Encounter (Signed)
Cleared for back surgery. Neg myoview Feb 2014. Will need Lovenox bridging since on Coumadin for PE and Lupus anticoagulant  JJB

## 2014-06-26 NOTE — Telephone Encounter (Signed)
Informed patient letter sent over to Dr. Kathyrn Sheriff for back surgery clearance.

## 2014-06-27 ENCOUNTER — Encounter (HOSPITAL_COMMUNITY): Payer: Self-pay | Admitting: Pharmacy Technician

## 2014-06-29 NOTE — Telephone Encounter (Signed)
Kristin, FYI.

## 2014-06-30 ENCOUNTER — Telehealth: Payer: Self-pay | Admitting: *Deleted

## 2014-06-30 NOTE — Telephone Encounter (Signed)
Dr Gwenlyn Found reviewed the chart and gave clearance to proceed with lumbar surgery and hold coumadin prior to procedure without lovenox bridging.  Form signed and faxed

## 2014-07-02 ENCOUNTER — Ambulatory Visit (INDEPENDENT_AMBULATORY_CARE_PROVIDER_SITE_OTHER): Payer: BC Managed Care – PPO | Admitting: Pharmacist Clinician (PhC)/ Clinical Pharmacy Specialist

## 2014-07-02 DIAGNOSIS — R760 Raised antibody titer: Secondary | ICD-10-CM

## 2014-07-02 DIAGNOSIS — R894 Abnormal immunological findings in specimens from other organs, systems and tissues: Secondary | ICD-10-CM

## 2014-07-02 DIAGNOSIS — I2699 Other pulmonary embolism without acute cor pulmonale: Secondary | ICD-10-CM

## 2014-07-02 DIAGNOSIS — Z7901 Long term (current) use of anticoagulants: Secondary | ICD-10-CM

## 2014-07-02 LAB — POCT INR: INR: 4.7

## 2014-07-02 MED ORDER — ENOXAPARIN SODIUM 100 MG/ML ~~LOC~~ SOLN: 100.0000 mg | Freq: Two times a day (BID) | SUBCUTANEOUS | Status: DC

## 2014-07-02 NOTE — Patient Instructions (Addendum)
Enoxaprin Dosing Schedule  Enoxparin dose:100mg /syringe  Date  Warfarin Dose (evenings) Enoxaprin Dose  8-5 6 0   8-6 5 0   8-7 4 0           8pm  8-8 3 0 8am   8pm  8-9 2 0 8am   8pm  8-10 1 0   8-11 Procedure    8-12 1 8mg  (2 tabs) 8am   8pm  8-13 2 8mg  (2 tabs) 8am   8pm  8-14 3 6mg  (1.5 tabs) 8am   8pm  8-15 4 6mg  (1.5 tabs) 8am   8pm  8-16 5 6mg  (1.5 tabs) 8am   8pm  8-17 6 Repeat INR

## 2014-07-03 ENCOUNTER — Encounter (HOSPITAL_COMMUNITY): Payer: Self-pay

## 2014-07-03 ENCOUNTER — Encounter (HOSPITAL_COMMUNITY)
Admission: RE | Admit: 2014-07-03 | Discharge: 2014-07-03 | Disposition: A | Discharge status: Home / Self Care | Payer: BC Managed Care – PPO | Source: Ambulatory Visit | Attending: Neurosurgery | Admitting: Neurosurgery

## 2014-07-03 DIAGNOSIS — Z01818 Encounter for other preprocedural examination: Secondary | ICD-10-CM | POA: Insufficient documentation

## 2014-07-03 DIAGNOSIS — Z01812 Encounter for preprocedural laboratory examination: Secondary | ICD-10-CM | POA: Insufficient documentation

## 2014-07-03 DIAGNOSIS — I10 Essential (primary) hypertension: Secondary | ICD-10-CM | POA: Insufficient documentation

## 2014-07-03 HISTORY — DX: Unspecified osteoarthritis, unspecified site: M19.90

## 2014-07-03 LAB — BASIC METABOLIC PANEL
Anion gap: 11 (ref 5–15)
BUN: 18 mg/dL (ref 6–23)
CO2: 26 mEq/L (ref 19–32)
Calcium: 8.4 mg/dL (ref 8.4–10.5)
Chloride: 105 mEq/L (ref 96–112)
Creatinine, Ser: 0.82 mg/dL (ref 0.50–1.35)
GFR calc Af Amer: >90 mL/min (ref >90)
GFR calc non Af Amer: >90 mL/min (ref >90)
Glucose, Bld: 135 mg/dL — ABNORMAL HIGH (ref 70–99)
Potassium: 4.4 mEq/L (ref 3.7–5.3)
Sodium: 142 mEq/L (ref 137–147)

## 2014-07-03 LAB — PROTIME-INR
INR: 2.92 — ABNORMAL HIGH (ref 0.00–1.49)
Prothrombin Time: 30.5 seconds — ABNORMAL HIGH (ref 11.6–15.2)

## 2014-07-03 LAB — CBC
HCT: 35.8 % — ABNORMAL LOW (ref 39.0–52.0)
Hemoglobin: 11.9 g/dL — ABNORMAL LOW (ref 13.0–17.0)
MCH: 28 pg (ref 26.0–34.0)
MCHC: 33.2 g/dL (ref 30.0–36.0)
MCV: 84.2 fL (ref 78.0–100.0)
Platelets: 266 10*3/uL (ref 150–400)
RBC: 4.25 MIL/uL (ref 4.22–5.81)
RDW: 13.9 % (ref 11.5–15.5)
WBC: 8.1 10*3/uL (ref 4.0–10.5)

## 2014-07-03 LAB — SURGICAL PCR SCREEN
MRSA, PCR: NEGATIVE
Staphylococcus aureus: NEGATIVE

## 2014-07-03 LAB — APTT: aPTT: 47 seconds — ABNORMAL HIGH (ref 24–37)

## 2014-07-03 NOTE — Pre-Procedure Instructions (Signed)
Eduardo Butler  07/03/2014   Your procedure is scheduled on:  07/08/14  Report to [facility] Admitting at 530 AM.  Call this number if you have problems the morning of surgery: [phone]   Remember:   Do not eat food or drink liquids after midnight.   Take these medicines the morning of surgery with A SIP OF WATER: hydrocodone,bystolic,prilosec   Do not wear jewelry, make-up or nail polish.  Do not wear lotions, powders, or perfumes. You may wear deodorant.  Do not shave 48 hours prior to surgery. Men may shave face and neck.  Do not bring valuables to the hospital.  [facility] is not responsible                  for any belongings or valuables.               Contacts, dentures or bridgework may not be worn into surgery.  Leave suitcase in the car. After surgery it may be brought to your room.  For patients admitted to the hospital, discharge time is determined by your                treatment team.               Patients discharged the day of surgery will not be allowed to drive  home.  Name and phone number of your driver: family  Special Instructions: Incentive Spirometry - Practice and bring it with you on the day of surgery.   Please read over the following fact sheets that you were given: Pain Booklet, Coughing and Deep Breathing, MRSA Information and Surgical Site Infection Prevention

## 2014-07-04 ENCOUNTER — Encounter (HOSPITAL_COMMUNITY): Payer: Self-pay

## 2014-07-04 NOTE — Progress Notes (Signed)
Anesthesia Chart Review:  Patient is a 63 year old male scheduled for left L4-5 foraminal microdiskectomy on 8/11/5 by Dr. Kathyrn Sheriff.  History includes non-smoker, CHF, CAD s/p CABG X 4 (LIMA to LAD, sequential SVG to acute marginal and distal RCA, left RA to CX) '03, HTN, HLD, DM2, SOB, DVT/bilateral PE '08 (+ lupus anticoagulant), arthritis. BMI is consistent with obesity. Cardiologist is Dr. Gwenlyn Found who cleared patient for surgery.  He is on a Lovenox bridge while off Coumadin. PCP is Dr. Hulan Fess.  EKG on 03/03/14 showed SB at 59 bpm, PAC's  Nuclear stress test on 01/04/13 showed: Overall Impression: Normal stress nuclear study. LV Wall Motion: NL LV Function; NL Wall Motion. EF 54%.  According to 03/03/14 noted, "I [Dr. Berry] catheterized him at the [facility] August 17, 2006 revealing patent grafts with normal LV function." His last echo in Epic is from 2003, and was normal.  CXR on 07/03/14 showed: 1. No acute good primary disease. Prior CABG. 2. No focal pulmonary infiltrate.  Preoperative labs noted.  Coumadin is being held 07/02/14 with plans to start Lovenox 07/04/14. Repeat PT/PTT on arrival.  If follow-up labs are acceptable and otherwise no acute changes then plan to proceed.  [facility] Short Stay Center/Anesthesiology Phone [phone] 07/04/2014 10:36 AM

## 2014-07-07 MED ORDER — CEFAZOLIN SODIUM-DEXTROSE 2-3 GM-% IV SOLR
2.0000 g | INTRAVENOUS | Status: AC
  Administered 2014-07-08: 2 g via INTRAVENOUS
  Filled 2014-07-07: qty 50

## 2014-07-08 ENCOUNTER — Encounter (HOSPITAL_COMMUNITY)
Admission: RE | Disposition: A | Discharge status: Home / Self Care | Payer: Self-pay | Source: Ambulatory Visit | Attending: Neurosurgery

## 2014-07-08 ENCOUNTER — Encounter (HOSPITAL_COMMUNITY): Payer: Self-pay | Admitting: *Deleted

## 2014-07-08 ENCOUNTER — Observation Stay (HOSPITAL_COMMUNITY): Payer: BC Managed Care – PPO

## 2014-07-08 ENCOUNTER — Observation Stay (HOSPITAL_COMMUNITY)
Admission: RE | Admit: 2014-07-08 | Discharge: 2014-07-08 | Disposition: A | Discharge status: Home / Self Care | Payer: BC Managed Care – PPO | Source: Ambulatory Visit | Attending: Neurosurgery | Admitting: Neurosurgery

## 2014-07-08 ENCOUNTER — Encounter (HOSPITAL_COMMUNITY): Payer: BC Managed Care – PPO | Admitting: Vascular Surgery

## 2014-07-08 ENCOUNTER — Ambulatory Visit (HOSPITAL_COMMUNITY): Payer: BC Managed Care – PPO | Admitting: Anesthesiology

## 2014-07-08 DIAGNOSIS — E119 Type 2 diabetes mellitus without complications: Secondary | ICD-10-CM | POA: Diagnosis not present

## 2014-07-08 DIAGNOSIS — M129 Arthropathy, unspecified: Secondary | ICD-10-CM | POA: Insufficient documentation

## 2014-07-08 DIAGNOSIS — Z86711 Personal history of pulmonary embolism: Secondary | ICD-10-CM | POA: Diagnosis not present

## 2014-07-08 DIAGNOSIS — I10 Essential (primary) hypertension: Secondary | ICD-10-CM | POA: Insufficient documentation

## 2014-07-08 DIAGNOSIS — I2510 Atherosclerotic heart disease of native coronary artery without angina pectoris: Secondary | ICD-10-CM | POA: Diagnosis not present

## 2014-07-08 DIAGNOSIS — I509 Heart failure, unspecified: Secondary | ICD-10-CM | POA: Diagnosis not present

## 2014-07-08 DIAGNOSIS — F411 Generalized anxiety disorder: Secondary | ICD-10-CM | POA: Insufficient documentation

## 2014-07-08 DIAGNOSIS — Z7982 Long term (current) use of aspirin: Secondary | ICD-10-CM | POA: Diagnosis not present

## 2014-07-08 DIAGNOSIS — M5104 Intervertebral disc disorders with myelopathy, thoracic region: Secondary | ICD-10-CM | POA: Diagnosis present

## 2014-07-08 DIAGNOSIS — Z951 Presence of aortocoronary bypass graft: Secondary | ICD-10-CM | POA: Diagnosis not present

## 2014-07-08 DIAGNOSIS — E669 Obesity, unspecified: Secondary | ICD-10-CM | POA: Insufficient documentation

## 2014-07-08 DIAGNOSIS — Z6837 Body mass index (BMI) 37.0-37.9, adult: Secondary | ICD-10-CM | POA: Insufficient documentation

## 2014-07-08 DIAGNOSIS — E785 Hyperlipidemia, unspecified: Secondary | ICD-10-CM | POA: Insufficient documentation

## 2014-07-08 DIAGNOSIS — Z79899 Other long term (current) drug therapy: Secondary | ICD-10-CM | POA: Diagnosis not present

## 2014-07-08 DIAGNOSIS — M5126 Other intervertebral disc displacement, lumbar region: Principal | ICD-10-CM | POA: Insufficient documentation

## 2014-07-08 HISTORY — PX: LUMBAR LAMINECTOMY/ DECOMPRESSION WITH MET-RX: SHX5959

## 2014-07-08 LAB — GLUCOSE, CAPILLARY
Glucose-Capillary: 178 mg/dL — ABNORMAL HIGH (ref 70–99)
Glucose-Capillary: 203 mg/dL — ABNORMAL HIGH (ref 70–99)
Glucose-Capillary: 208 mg/dL — ABNORMAL HIGH (ref 70–99)

## 2014-07-08 LAB — APTT: aPTT: 28 seconds (ref 24–37)

## 2014-07-08 LAB — PROTIME-INR
INR: 0.95 (ref 0.00–1.49)
Prothrombin Time: 12.7 seconds (ref 11.6–15.2)

## 2014-07-08 SURGERY — LUMBAR LAMINECTOMY/ DECOMPRESSION WITH MET-RX
Anesthesia: General | Laterality: Left

## 2014-07-08 MED ORDER — PROPOFOL 10 MG/ML IV BOLUS
INTRAVENOUS | Status: AC
  Filled 2014-07-08: qty 20

## 2014-07-08 MED ORDER — ASPIRIN EC 81 MG PO TBEC: 81.0000 mg | DELAYED_RELEASE_TABLET | Freq: Every day | ORAL | Status: DC

## 2014-07-08 MED ORDER — SENNA 8.6 MG PO TABS: 1.0000 | ORAL_TABLET | Freq: Two times a day (BID) | ORAL | Status: DC

## 2014-07-08 MED ORDER — OXYCODONE-ACETAMINOPHEN 10-325 MG PO TABS: 1.0000 | ORAL_TABLET | ORAL | Status: DC | PRN

## 2014-07-08 MED ORDER — NEOSTIGMINE METHYLSULFATE 10 MG/10ML IV SOLN
INTRAVENOUS | Status: AC
  Filled 2014-07-08: qty 1

## 2014-07-08 MED ORDER — NEBIVOLOL HCL 5 MG PO TABS: 5.0000 mg | ORAL_TABLET | Freq: Every day | ORAL | Status: DC

## 2014-07-08 MED ORDER — MENTHOL 3 MG MT LOZG: 1.0000 | LOZENGE | OROMUCOSAL | Status: DC | PRN

## 2014-07-08 MED ORDER — DOCUSATE SODIUM 100 MG PO CAPS
100.0000 mg | ORAL_CAPSULE | Freq: Two times a day (BID) | ORAL | Status: DC
  Filled 2014-07-08 (×2): qty 1

## 2014-07-08 MED ORDER — ONDANSETRON HCL 4 MG/2ML IJ SOLN
INTRAMUSCULAR | Status: DC | PRN
  Administered 2014-07-08: 4 mg via INTRAVENOUS

## 2014-07-08 MED ORDER — GLYCOPYRROLATE 0.2 MG/ML IJ SOLN
INTRAMUSCULAR | Status: AC
  Filled 2014-07-08: qty 3

## 2014-07-08 MED ORDER — BUPIVACAINE HCL (PF) 0.5 % IJ SOLN
INTRAMUSCULAR | Status: DC | PRN
Start: 2014-07-08 — End: 2014-07-08
  Administered 2014-07-08: 3.5 mL

## 2014-07-08 MED ORDER — HYDROMORPHONE HCL PF 1 MG/ML IJ SOLN: 0.2500 mg | INTRAMUSCULAR | Status: DC | PRN

## 2014-07-08 MED ORDER — SODIUM CHLORIDE 0.9 % IV SOLN: 250.0000 mL | INTRAVENOUS | Status: DC

## 2014-07-08 MED ORDER — LACTATED RINGERS IV SOLN
INTRAVENOUS | Status: DC | PRN
  Administered 2014-07-08 (×2): via INTRAVENOUS

## 2014-07-08 MED ORDER — FENTANYL CITRATE 0.05 MG/ML IJ SOLN
INTRAMUSCULAR | Status: DC | PRN
  Administered 2014-07-08 (×4): 50 ug via INTRAVENOUS

## 2014-07-08 MED ORDER — FENTANYL CITRATE 0.05 MG/ML IJ SOLN
INTRAMUSCULAR | Status: DC | PRN
  Administered 2014-07-08: 100 ug via INTRAVENOUS

## 2014-07-08 MED ORDER — ONDANSETRON HCL 4 MG/2ML IJ SOLN
4.0000 mg | Freq: Once | INTRAMUSCULAR | Status: DC | PRN
Start: 2014-07-08 — End: 2014-07-08

## 2014-07-08 MED ORDER — ONDANSETRON HCL 4 MG/2ML IJ SOLN
4.0000 mg | INTRAMUSCULAR | Status: DC | PRN
  Filled 2014-07-08: qty 2

## 2014-07-08 MED ORDER — LOSARTAN POTASSIUM 50 MG PO TABS
50.0000 mg | ORAL_TABLET | Freq: Every day | ORAL | Status: DC
  Filled 2014-07-08: qty 1

## 2014-07-08 MED ORDER — SODIUM CHLORIDE 0.9 % IJ SOLN: 3.0000 mL | Freq: Two times a day (BID) | INTRAMUSCULAR | Status: DC

## 2014-07-08 MED ORDER — GLYCOPYRROLATE 0.2 MG/ML IJ SOLN
INTRAMUSCULAR | Status: DC | PRN
  Administered 2014-07-08: .6 mg via INTRAVENOUS

## 2014-07-08 MED ORDER — LIDOCAINE-EPINEPHRINE 1 %-1:100000 IJ SOLN
INTRAMUSCULAR | Status: DC | PRN
  Administered 2014-07-08: 3.5 mL via INTRADERMAL

## 2014-07-08 MED ORDER — NEOSTIGMINE METHYLSULFATE 10 MG/10ML IV SOLN
INTRAVENOUS | Status: DC | PRN
  Administered 2014-07-08: 4 mg via INTRAVENOUS

## 2014-07-08 MED ORDER — ROCURONIUM BROMIDE 100 MG/10ML IV SOLN
INTRAVENOUS | Status: DC | PRN
  Administered 2014-07-08: 50 mg via INTRAVENOUS
  Administered 2014-07-08: 10 mg via INTRAVENOUS
  Administered 2014-07-08: 5 mg via INTRAVENOUS

## 2014-07-08 MED ORDER — HEPARIN SODIUM (PORCINE) 5000 UNIT/ML IJ SOLN
5000.0000 [IU] | Freq: Three times a day (TID) | INTRAMUSCULAR | Status: DC
  Filled 2014-07-08: qty 1

## 2014-07-08 MED ORDER — EZETIMIBE-SIMVASTATIN 10-40 MG PO TABS
1.0000 | ORAL_TABLET | Freq: Every day | ORAL | Status: DC
  Filled 2014-07-08: qty 1

## 2014-07-08 MED ORDER — PHENOL 1.4 % MT LIQD: 1.0000 | OROMUCOSAL | Status: DC | PRN

## 2014-07-08 MED ORDER — DIAZEPAM 5 MG PO TABS: 5.0000 mg | ORAL_TABLET | Freq: Four times a day (QID) | ORAL | Status: DC | PRN

## 2014-07-08 MED ORDER — MIDAZOLAM HCL 2 MG/2ML IJ SOLN
INTRAMUSCULAR | Status: AC
  Filled 2014-07-08: qty 2

## 2014-07-08 MED ORDER — FOLIC ACID 1 MG PO TABS
1.0000 mg | ORAL_TABLET | Freq: Every day | ORAL | Status: DC
  Filled 2014-07-08: qty 1

## 2014-07-08 MED ORDER — LIDOCAINE HCL (CARDIAC) 20 MG/ML IV SOLN
INTRAVENOUS | Status: AC
  Filled 2014-07-08: qty 5

## 2014-07-08 MED ORDER — MIDAZOLAM HCL 5 MG/5ML IJ SOLN
INTRAMUSCULAR | Status: DC | PRN
  Administered 2014-07-08 (×2): 1 mg via INTRAVENOUS

## 2014-07-08 MED ORDER — HEMOSTATIC AGENTS (NO CHARGE) OPTIME
TOPICAL | Status: DC | PRN
  Administered 2014-07-08: 1 via TOPICAL

## 2014-07-08 MED ORDER — FENTANYL CITRATE 0.05 MG/ML IJ SOLN
INTRAMUSCULAR | Status: AC
  Filled 2014-07-08: qty 5

## 2014-07-08 MED ORDER — METHYLPREDNISOLONE ACETATE 80 MG/ML IJ SUSP
INTRAMUSCULAR | Status: DC | PRN
  Administered 2014-07-08: 80 mg via INTRA_ARTICULAR

## 2014-07-08 MED ORDER — PANTOPRAZOLE SODIUM 40 MG PO TBEC: 80.0000 mg | DELAYED_RELEASE_TABLET | Freq: Every day | ORAL | Status: DC

## 2014-07-08 MED ORDER — SODIUM CHLORIDE 0.9 % IJ SOLN: 3.0000 mL | INTRAMUSCULAR | Status: DC | PRN

## 2014-07-08 MED ORDER — MELOXICAM 7.5 MG PO TABS
7.5000 mg | ORAL_TABLET | Freq: Every day | ORAL | Status: DC
  Filled 2014-07-08: qty 1

## 2014-07-08 MED ORDER — ACETAMINOPHEN 325 MG PO TABS: 650.0000 mg | ORAL_TABLET | ORAL | Status: DC | PRN

## 2014-07-08 MED ORDER — MORPHINE SULFATE 2 MG/ML IJ SOLN: 1.0000 mg | INTRAMUSCULAR | Status: DC | PRN

## 2014-07-08 MED ORDER — ACETAMINOPHEN 650 MG RE SUPP: 650.0000 mg | RECTAL | Status: DC | PRN

## 2014-07-08 MED ORDER — PROPOFOL 10 MG/ML IV BOLUS
INTRAVENOUS | Status: DC | PRN
  Administered 2014-07-08: 170 mg via INTRAVENOUS

## 2014-07-08 MED ORDER — 0.9 % SODIUM CHLORIDE (POUR BTL) OPTIME
TOPICAL | Status: DC | PRN
  Administered 2014-07-08: 1000 mL

## 2014-07-08 MED ORDER — LIDOCAINE HCL (CARDIAC) 20 MG/ML IV SOLN
INTRAVENOUS | Status: DC | PRN
  Administered 2014-07-08: 50 mg via INTRAVENOUS

## 2014-07-08 MED ORDER — CEFAZOLIN SODIUM 1-5 GM-% IV SOLN: 1.0000 g | Freq: Three times a day (TID) | INTRAVENOUS | Status: DC

## 2014-07-08 MED ORDER — SIMVASTATIN 40 MG PO TABS: 40.0000 mg | ORAL_TABLET | Freq: Every day | ORAL | Status: DC

## 2014-07-08 MED ORDER — PNEUMOCOCCAL VAC POLYVALENT 25 MCG/0.5ML IJ INJ: 0.5000 mL | INJECTION | INTRAMUSCULAR | Status: DC

## 2014-07-08 MED ORDER — ONDANSETRON HCL 4 MG/2ML IJ SOLN
INTRAMUSCULAR | Status: AC
  Filled 2014-07-08: qty 2

## 2014-07-08 MED ORDER — SODIUM CHLORIDE 0.9 % IR SOLN
Status: DC | PRN
  Administered 2014-07-08: 08:00:00

## 2014-07-08 MED ORDER — ROCURONIUM BROMIDE 50 MG/5ML IV SOLN
INTRAVENOUS | Status: AC
  Filled 2014-07-08: qty 1

## 2014-07-08 MED ORDER — SODIUM CHLORIDE 0.9 % IV SOLN: INTRAVENOUS | Status: DC

## 2014-07-08 MED ORDER — FENTANYL CITRATE 0.05 MG/ML IJ SOLN
INTRAMUSCULAR | Status: AC
  Filled 2014-07-08: qty 2

## 2014-07-08 MED ORDER — ARTIFICIAL TEARS OP OINT
TOPICAL_OINTMENT | OPHTHALMIC | Status: DC | PRN
  Administered 2014-07-08: 1 via OPHTHALMIC

## 2014-07-08 MED ORDER — OXYCODONE-ACETAMINOPHEN 5-325 MG PO TABS
1.0000 | ORAL_TABLET | ORAL | Status: DC | PRN
  Administered 2014-07-08: 1 via ORAL
  Filled 2014-07-08: qty 1

## 2014-07-08 MED ORDER — DEXAMETHASONE SODIUM PHOSPHATE 4 MG/ML IJ SOLN
INTRAMUSCULAR | Status: DC | PRN
  Administered 2014-07-08: 4 mg via INTRAVENOUS

## 2014-07-08 MED ORDER — METFORMIN HCL 500 MG PO TABS
500.0000 mg | ORAL_TABLET | Freq: Two times a day (BID) | ORAL | Status: DC
  Filled 2014-07-08 (×2): qty 1

## 2014-07-08 MED ORDER — THROMBIN 5000 UNITS EX SOLR
CUTANEOUS | Status: DC | PRN
  Administered 2014-07-08 (×2): 5000 [IU] via TOPICAL

## 2014-07-08 MED ORDER — SUCCINYLCHOLINE CHLORIDE 20 MG/ML IJ SOLN
INTRAMUSCULAR | Status: AC
  Filled 2014-07-08: qty 1

## 2014-07-08 MED ORDER — EPHEDRINE SULFATE 50 MG/ML IJ SOLN
INTRAMUSCULAR | Status: DC | PRN
  Administered 2014-07-08 (×2): 5 mg via INTRAVENOUS

## 2014-07-08 SURGICAL SUPPLY — 69 items
ADH SKN CLS APL DERMABOND .7 (GAUZE/BANDAGES/DRESSINGS) ×1
APL SKNCLS STERI-STRIP NONHPOA (GAUZE/BANDAGES/DRESSINGS)
BAG DECANTER FOR FLEXI CONT (MISCELLANEOUS) ×2 IMPLANT
BENZOIN TINCTURE PRP APPL 2/3 (GAUZE/BANDAGES/DRESSINGS) IMPLANT
BLADE SURG 11 STRL SS (BLADE) ×2 IMPLANT
BLADE SURG ROTATE 9660 (MISCELLANEOUS) IMPLANT
BUR MATCHSTICK NEURO 3.0 LAGG (BURR) IMPLANT
CANISTER SUCT 3000ML (MISCELLANEOUS) ×2 IMPLANT
CONT SPEC 4OZ CLIKSEAL STRL BL (MISCELLANEOUS) ×2 IMPLANT
DECANTER SPIKE VIAL GLASS SM (MISCELLANEOUS) ×2 IMPLANT
DERMABOND ADVANCED (GAUZE/BANDAGES/DRESSINGS) ×1
DERMABOND ADVANCED .7 DNX12 (GAUZE/BANDAGES/DRESSINGS) ×1 IMPLANT
DRAPE C-ARM 42X72 X-RAY (DRAPES) ×4 IMPLANT
DRAPE LAPAROTOMY 100X72X124 (DRAPES) ×2 IMPLANT
DRAPE MICROSCOPE LEICA (MISCELLANEOUS) ×2 IMPLANT
DRAPE POUCH INSTRU U-SHP 10X18 (DRAPES) ×2 IMPLANT
DRAPE SURG 17X23 STRL (DRAPES) ×2 IMPLANT
DRSG OPSITE POSTOP 3X4 (GAUZE/BANDAGES/DRESSINGS) ×2 IMPLANT
DURAPREP 26ML APPLICATOR (WOUND CARE) ×2 IMPLANT
ELECT BLADE 6.5 EXT (BLADE) ×2 IMPLANT
ELECT REM PT RETURN 9FT ADLT (ELECTROSURGICAL) ×2
ELECTRODE REM PT RTRN 9FT ADLT (ELECTROSURGICAL) ×1 IMPLANT
GAUZE SPONGE 4X4 12PLY STRL (GAUZE/BANDAGES/DRESSINGS) IMPLANT
GAUZE SPONGE 4X4 16PLY XRAY LF (GAUZE/BANDAGES/DRESSINGS) IMPLANT
GLOVE BIO SURGEON STRL SZ8 (GLOVE) ×1 IMPLANT
GLOVE BIOGEL PI IND STRL 7.5 (GLOVE) ×1 IMPLANT
GLOVE BIOGEL PI IND STRL 8.5 (GLOVE) IMPLANT
GLOVE BIOGEL PI INDICATOR 7.5 (GLOVE) ×1
GLOVE BIOGEL PI INDICATOR 8.5 (GLOVE) ×1
GLOVE ECLIPSE 7.0 STRL STRAW (GLOVE) ×2 IMPLANT
GLOVE EXAM NITRILE LRG STRL (GLOVE) IMPLANT
GLOVE EXAM NITRILE MD LF STRL (GLOVE) IMPLANT
GLOVE EXAM NITRILE XL STR (GLOVE) IMPLANT
GLOVE EXAM NITRILE XS STR PU (GLOVE) IMPLANT
GLOVE SURG SS PI 7.0 STRL IVOR (GLOVE) ×2 IMPLANT
GOWN STRL REUS W/ TWL LRG LVL3 (GOWN DISPOSABLE) ×2 IMPLANT
GOWN STRL REUS W/ TWL XL LVL3 (GOWN DISPOSABLE) IMPLANT
GOWN STRL REUS W/TWL 2XL LVL3 (GOWN DISPOSABLE) IMPLANT
GOWN STRL REUS W/TWL LRG LVL3 (GOWN DISPOSABLE) ×6
GOWN STRL REUS W/TWL XL LVL3 (GOWN DISPOSABLE)
HEMOSTAT POWDER KIT SURGIFOAM (HEMOSTASIS) ×1 IMPLANT
KIT BASIN OR (CUSTOM PROCEDURE TRAY) ×2 IMPLANT
KIT ROOM TURNOVER OR (KITS) ×2 IMPLANT
NDL HYPO 18GX1.5 BLUNT FILL (NEEDLE) IMPLANT
NDL HYPO 25X1 1.5 SAFETY (NEEDLE) ×1 IMPLANT
NDL SPNL 18GX3.5 QUINCKE PK (NEEDLE) IMPLANT
NEEDLE HYPO 18GX1.5 BLUNT FILL (NEEDLE) ×2 IMPLANT
NEEDLE HYPO 25X1 1.5 SAFETY (NEEDLE) ×2 IMPLANT
NEEDLE SPNL 18GX3.5 QUINCKE PK (NEEDLE) IMPLANT
NS IRRIG 1000ML POUR BTL (IV SOLUTION) ×2 IMPLANT
PACK LAMINECTOMY NEURO (CUSTOM PROCEDURE TRAY) ×2 IMPLANT
PAD ARMBOARD 7.5X6 YLW CONV (MISCELLANEOUS) ×6 IMPLANT
RUBBERBAND STERILE (MISCELLANEOUS) ×4 IMPLANT
SPONGE LAP 4X18 X RAY DECT (DISPOSABLE) IMPLANT
SPONGE SURGIFOAM ABS GEL SZ50 (HEMOSTASIS) ×2 IMPLANT
STRIP CLOSURE SKIN 1/2X4 (GAUZE/BANDAGES/DRESSINGS) IMPLANT
SUT VIC AB 0 CT1 18XCR BRD8 (SUTURE) ×1 IMPLANT
SUT VIC AB 0 CT1 8-18 (SUTURE) ×2
SUT VIC AB 2-0 CT1 18 (SUTURE) IMPLANT
SUT VIC AB 3-0 FS2 27 (SUTURE) IMPLANT
SUT VIC AB 3-0 SH 8-18 (SUTURE) ×1 IMPLANT
SUT VICRYL 3-0 RB1 18 ABS (SUTURE) ×1 IMPLANT
SYR 20ML ECCENTRIC (SYRINGE) ×2 IMPLANT
SYR 3ML LL SCALE MARK (SYRINGE) IMPLANT
SYR 5ML LL (SYRINGE) ×1 IMPLANT
TOWEL OR 17X24 6PK STRL BLUE (TOWEL DISPOSABLE) ×2 IMPLANT
TOWEL OR 17X26 10 PK STRL BLUE (TOWEL DISPOSABLE) ×2 IMPLANT
WATER STERILE IRR 1000ML POUR (IV SOLUTION) ×2 IMPLANT
WIRE TIP MIS 2.5MM NEURO (BURR) ×2 IMPLANT

## 2014-07-08 NOTE — Transfer of Care (Signed)
Immediate Anesthesia Transfer of Care Note  Patient: Eduardo Butler  Procedure(s) Performed: Procedure(s) with comments: Left Lumbar Four-Five Metrex foraminal microdiskectomy (Left) - Left Lumbar Four-Five Metrex foraminal microdiskectomy  Patient Location: PACU  Anesthesia Type:General  Level of Consciousness: sedated  Airway & Oxygen Therapy: Patient Spontanous Breathing and Patient connected to nasal cannula oxygen  Post-op Assessment: Report given to PACU RN, Post -op Vital signs reviewed and stable and Patient moving all extremities  Post vital signs: Reviewed and stable  Complications: No apparent anesthesia complications

## 2014-07-08 NOTE — Progress Notes (Signed)
Pt doing well. Pt and wife given D/C instructions with Rx's, verbal understanding was provided. Pt's IV was removed prior to D/C. Pt's incision was clean, dry, and intact. Pt D/C'd home @ 1645 via walking per MD order. Pt is stable @ D/C and has no other needs at this time. Holli Humbles, RN

## 2014-07-08 NOTE — Anesthesia Postprocedure Evaluation (Signed)
  Anesthesia Post-op Note  Patient: Eduardo Butler  Procedure(s) Performed: Procedure(s) with comments: Left Lumbar Four-Five Metrex foraminal microdiskectomy (Left) - Left Lumbar Four-Five Metrex foraminal microdiskectomy  Patient Location: PACU  Anesthesia Type:General  Level of Consciousness: awake, alert  and oriented  Airway and Oxygen Therapy: Patient Spontanous Breathing and Patient connected to nasal cannula oxygen  Post-op Pain: mild  Post-op Assessment: Post-op Vital signs reviewed, Patient's Cardiovascular Status Stable, Respiratory Function Stable, Patent Airway and Pain level controlled  Post-op Vital Signs: stable  Last Vitals:  Filed Vitals:   07/08/14 1050  BP:   Pulse:   Temp: 36.5 C  Resp:     Complications: No apparent anesthesia complications

## 2014-07-08 NOTE — Anesthesia Preprocedure Evaluation (Addendum)
Anesthesia Evaluation  Patient identified by MRN, date of birth, ID band Patient awake    Reviewed: Allergy & Precautions, H&P , NPO status , Patient's Chart, lab work & pertinent test results, reviewed documented beta blocker date and time   Airway Mallampati: III TM Distance: <3 FB Neck ROM: Full    Dental  (+) Teeth Intact, Dental Advisory Given   Pulmonary shortness of breath and with exertion,  breath sounds clear to auscultation        Cardiovascular hypertension, + CAD and + Peripheral Vascular Disease Rhythm:Regular Rate:Normal  CABG 2003  Hx PE 2006, on Coumadin since then; Lovenox bridge for surgery   Neuro/Psych Anxiety    GI/Hepatic   Endo/Other  diabetes, Type 2, Oral Hypoglycemic Agents  Renal/GU      Musculoskeletal   Abdominal (+) + obese,   Peds  Hematology   Anesthesia Other Findings   Reproductive/Obstetrics                         Anesthesia Physical Anesthesia Plan  ASA: III  Anesthesia Plan: General   Post-op Pain Management:    Induction: Intravenous  Airway Management Planned: Oral ETT  Additional Equipment:   Intra-op Plan:   Post-operative Plan: Extubation in OR  Informed Consent: I have reviewed the patients History and Physical, chart, labs and discussed the procedure including the risks, benefits and alternatives for the proposed anesthesia with the patient or authorized representative who has indicated his/her understanding and acceptance.   Dental advisory given  Plan Discussed with:   Anesthesia Plan Comments: (HNP L4-5 Htn Obesity CAD S/P CABG 2003 (-) Lexiscan  Stress test 2014 EF 54% H/O DVT x2  lupus anti-coagulant, on chronic coumadin with lovenox bridge INR   Plan GA with oral ETT  Roberts Gaudy, MD)        Anesthesia Quick Evaluation

## 2014-07-08 NOTE — Discharge Summary (Signed)
  Physician Discharge Summary  Patient ID: [name] MRN: [id number] DOB/AGE: [birth year] 63 y.o.  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 *   Discharged Condition: Stable  Hospital Course:  Mrs. Eduardo Butler is a 63 y.o. male who presented to the clinic with left L4 radiculopathy and MRI demonstrating foraminal left L4-5 disc herniation. The patient was admitted for elective left L4-5 foraminal microdiscectomy which was done without complication. Postoperatively, the patient was at his neurologic baseline. Back pain was controlled with oral medication, he was ambulating without difficulty, voiding normally, and tolerating diet.  Treatments: Surgery - Left L4-5 foraminal microdiscectomy  Discharge Exam: Blood pressure 114/57, pulse 72, temperature 98 F (36.7 C), temperature source Oral, resp. rate 33, height 5\' 6"  (1.676 m), weight 106.323 kg (234 lb 6.4 oz), SpO2 93.00%. Awake, alert, oriented Speech fluent, appropriate CN grossly intact 5/5 BUE/BLE Wound c/d/i  Follow-up: Follow-up in my office [facility] [phone]) in 2-3 weeks  Disposition: 01-Home or Self Care     Medication List    STOP taking these medications       HYDROcodone-acetaminophen 5-325 MG per tablet  Commonly known as:  NORCO/VICODIN     simvastatin 40 MG tablet  Commonly known as:  ZOCOR      TAKE these medications       ACID REDUCER PO  Take 1 tablet by mouth at bedtime.     aspirin EC 81 MG tablet  Take 81 mg by mouth daily.     diazepam 5 MG tablet  Commonly known as:  VALIUM  Take 1 tablet (5 mg total) by mouth every 6 (six) hours as needed for anxiety.     enoxaparin 100 MG/ML injection  Commonly known as:  LOVENOX  Inject 1 mL (100 mg total) into the skin every 12 (twelve) hours.     ezetimibe-simvastatin 10-40 MG per tablet  Commonly known as:  VYTORIN  Take 1 tablet by mouth daily.     folic acid 1 MG tablet  Commonly known as:  FOLVITE  Take 1 mg by mouth daily.     losartan 50 MG tablet  Commonly known as:  COZAAR  Take 50 mg by mouth daily.     meloxicam 7.5 MG tablet  Commonly known as:  MOBIC  Take 7.5 mg by mouth daily.     metFORMIN 500 MG tablet  Commonly known as:  GLUCOPHAGE  Take 500 mg by mouth 2 (two) times daily with a meal.     nebivolol 5 MG tablet  Commonly known as:  BYSTOLIC  Take 5 mg by mouth daily.     omeprazole 40 MG capsule  Commonly known as:  PRILOSEC  Take 40 mg by mouth daily.     oxyCODONE-acetaminophen 10-325 MG per tablet  Commonly known as:  PERCOCET  Take 1 tablet by mouth every 4 (four) hours as needed for pain.     vitamin C 1000 MG tablet  Take 1,000 mg by mouth daily.     warfarin 4 MG tablet  Commonly known as:  COUMADIN  Take 4 mg by mouth at bedtime.         SignedConsuella Lose, C 07/08/2014, 10:11 AM

## 2014-07-08 NOTE — H&P (Signed)
CC:  Back and left leg pain  HPI: Eduardo Butler is a 63 year old man seen for initial consultation in the office. He comes in with the primary complaint of back and left leg pain. He says he has a chronic history of back pain which started in 2007, which was relieved after a course of physical therapy. Approximately one month ago he started to have pain across his lower back. About 2 weeks ago he began to experience pain down his left leg primarily involving his left hip, lateral thigh, knee, and the anterolateral aspect of his shin. He does not have any pain in his foot. There is no numbness or tingling associated with it. He does have some subjective weakness, where he is describing his leg feeling like it like it gives out. He says he is an avid golfer, and is unable to do so because of this pain. He has been prescribed Vicodin which does provide some relief.   PMH: Past Medical History  Diagnosis Date  . Anxiety   . Hyperlipidemia   . Hypertension   . Peroneal DVT (deep venous thrombosis)   . CHF (congestive heart failure)   . Coronary artery disease   . Shortness of breath   . H/O echocardiogram 12/02/2010    EF 55% , no significant abnormalities  . History of stress test 01/04/2013    Lexiscan Myoview ; scattered PVC's ; LV EF 54% ; Normal stress test   . H/O cardiac catheterization 08/25/2006    normal cath-patent grafts  . H/O Doppler ultrasound 02/21/2011    lower ext.venous doppler,, no treatment except support stockings are recommended if clinically indicated  . Diabetes   . Obesity   . Arthritis     PSH: Past Surgical History  Procedure Laterality Date  . Shoulder surgery    . Bypass graft  2003    4 bypass   . Pelvic laparoscopy  2005  . Coronary artery bypass graft  2003  . Hernia repair    . Umbilical hernia repair  11/17/2011    Procedure: HERNIA REPAIR UMBILICAL ADULT;  Surgeon: Harl Bowie, MD;  Location: [place];  Service: General;   Laterality: N/A;  umbilical hernia repair with mesh  . Cardiac catheterization  2002-02-21    S/P sudden cardiac death; three vessel coronary artery disease; preserved overall left ventricular systolic function with loculated mild to moderate anterolateral hypokinesis; no mitral regurgitation noted    SH: History  Substance Use Topics  . Smoking status: Never Smoker   . Smokeless tobacco: Never Used  . Alcohol Use: No     Comment: none since 1979    MEDS: Prior to Admission medications   Medication Sig Start Date End Date Taking? Authorizing Provider  Ascorbic Acid (VITAMIN C) 1000 MG tablet Take 1,000 mg by mouth daily.   Yes Historical Provider, MD  aspirin EC 81 MG tablet Take 81 mg by mouth daily.   Yes Historical Provider, MD  enoxaparin (LOVENOX) 100 MG/ML injection Inject 1 mL (100 mg total) into the skin every 12 (twelve) hours. 07/02/14  Yes Tommy Medal, RPH-CPP  ezetimibe-simvastatin (VYTORIN) 10-40 MG per tablet Take 1 tablet by mouth daily.   Yes Historical Provider, MD  folic acid (FOLVITE) 1 MG tablet Take 1 mg by mouth daily.    Yes Historical Provider, MD  HYDROcodone-acetaminophen (NORCO/VICODIN) 5-325 MG per tablet Take 1 tablet by mouth every 6 (six) hours as needed for moderate pain.   Yes Historical  Provider, MD  losartan (COZAAR) 50 MG tablet Take 50 mg by mouth daily. 05/19/14  Yes Troy Sine, MD  meloxicam (MOBIC) 7.5 MG tablet Take 7.5 mg by mouth daily.   Yes Historical Provider, MD  metFORMIN (GLUCOPHAGE) 500 MG tablet Take 500 mg by mouth 2 (two) times daily with a meal.    Yes Historical Provider, MD  nebivolol (BYSTOLIC) 5 MG tablet Take 5 mg by mouth daily. 03/07/14  Yes Lorretta Harp, MD  omeprazole (PRILOSEC) 40 MG capsule Take 40 mg by mouth daily.   Yes Historical Provider, MD  Ranitidine HCl (ACID REDUCER PO) Take 1 tablet by mouth at bedtime.   Yes Historical Provider, MD  warfarin (COUMADIN) 4 MG tablet Take 4 mg by mouth at bedtime.    Yes  Historical Provider, MD    ALLERGY: No Known Allergies  ROS: ROS  NEUROLOGIC EXAM: Awake, alert, oriented Memory and concentration grossly intact Speech fluent, appropriate CN grossly intact Motor exam: Upper Extremities Deltoid Bicep Tricep Grip  Right 5/5 5/5 5/5 5/5  Left 5/5 5/5 5/5 5/5   Lower Extremity IP Quad PF DF EHL  Right 5/5 5/5 5/5 5/5 5/5  Left 5/5 5/5 5/5 4+/5 4+/5   Sensation grossly intact to LT  IMGAING: MRI of the lumbar spine was reviewed which demonstrates a small right-sided far lateral disc herniation at L2-3. There is also a left-sided foraminal disc herniation causing some foraminal stenosis at L4 L5 on the left.  IMPRESSION: - 63 y.o. male with left radiculopathy and foraminal disc herniation  PLAN: - left L4-5 foraminal discectomy - Periop abx - Likely home post-procedure

## 2014-07-08 NOTE — Op Note (Signed)
PREOP DIAGNOSIS: Left L4-5 lateral extraforaminal disc herniation   POSTOP DIAGNOSIS: Same  PROCEDURE: 1. Left L4-5 lateral extraforaminal approach, discectomy at L4-5 for decompression of exiting nerve root 2. Use of intraoperative microscope for microdissection  SURGEON: Dr. Consuella Lose, MD  ASSISTANT: Dr. Erline Levine, M.D.  ANESTHESIA: General Endotracheal  EBL: 100 cc  SPECIMENS: None  DRAINS: None  COMPLICATIONS: None immediate  CONDITION: Hemodynamically stable to post anesthesia care unit  HISTORY: Eduardo Butler is a 63 y.o. y.o. male who initially presented to the outpatient clinic complaining of left leg pain consistent with a radiculopathy. MRI demonstrated lateral extraforaminal L4-5 disc herniation. The patient failed a course of conservative management and elected to proceed with surgical decompression. The risks of surgery and the benefits were explained in detail to the patient and his family. After all their questions were answered, verbal and written consent was obtained and placed in the chart.  PROCEDURE IN DETAIL: After informed consent was obtained and witnessed, the patient was brought to the operating room. After induction of general anesthesia, the patient was positioned on the operative table in the prone position. All pressure points were meticulously padded. The skin of the lower back was then prepped and draped in the usual sterile fashion.  After timeout was conducted, localizing needle was introduced to identify the lateral aspect of the left L4 pars interarticularis and a trajectory parallel to the disc space. AP and lateral fluoroscopy was used to confirm good trajectory and placement of the needle-tip. Skin incision was then marked out and infiltrated with local anesthetic. Skin incision was then made sharply, and Bovie electrocautery was used to dissect the subcutaneous tissue until the lumbodorsal fascia was identified. This was then incised, and  using sequential dilators placed under fluoroscopy, a Metrex tube was placed. Position of the tube was confirmed using fluoroscopy.  At this point, the microscope was draped sterilely and brought into the field, and the remainder of the case was done under the microscope using microdissection. The superior articulating process of L5 was identified as was the lateral aspect of the L4 pars on the left side. This was cleaned of overlying muscle, and a high-speed drill was used to complete a partial lateral facetectomy, and the lateral aspect of the pars interarticularis was drilled out. The intertransverse ligament was then identified and elevated, and removed using Kerrison rongeurs. Perineural fat was then identified and dissected until the left L4 nerve root was identified. Inferior to the nerve root, the disc space was identified and incised. Using a combination of curettes and rongeurs, multiple small disc fragments were removed. After all disc fragments were removed, good decompression of the nerve in the foramen was confirmed using micro-nerve hooks.   Having completed the decompression, the wound is irrigated with copious amounts of antibiotic saline. Hemostasis was achieved using Gelfoam with thrombin. The nerve root was then covered with a long-acting steroid solution mixed with fentanyl.  The Metrex tube was then removed, hemostasis was achieved using bipolar electrocautery on the muscle edges, and the fascia was closed using interrupted 0 Vicryl stitches. Subcutaneous layer was then closed using interrupted 3-0 Vicryl stitches, and the skin was closed using standard surgical glue. At the end of the case all sponge, needle, and instrument counts were correct. Sterile dressing was then applied, the patient was transferred to the stretcher, extubated, and taken to the postanesthesia care unit in stable hemodynamic condition.

## 2014-07-10 ENCOUNTER — Encounter (HOSPITAL_COMMUNITY): Payer: Self-pay | Admitting: Neurosurgery

## 2014-07-11 ENCOUNTER — Telehealth: Payer: Self-pay | Admitting: Pharmacist Clinician (PhC)/ Clinical Pharmacy Specialist

## 2014-07-11 NOTE — Telephone Encounter (Signed)
Returned call, pt states had INR checked today, was 1.1.  Explained that it often takes 3-4 days for INR to rise, pt should take 2 tablets today then resume schedule given, repeat INR Monday.  Pt voiced understanding

## 2014-07-11 NOTE — Telephone Encounter (Signed)
Patient needs to speak with Eduardo Butler about his Lovenox injections.  Message forwarded.

## 2014-07-11 NOTE — Telephone Encounter (Signed)
Patient has had his surgery and he now needs to talk with you regarding his medications.

## 2014-08-25 ENCOUNTER — Encounter: Payer: Self-pay | Admitting: Cardiovascular Disease

## 2014-09-30 ENCOUNTER — Other Ambulatory Visit: Payer: Self-pay | Admitting: Cardiovascular Disease

## 2014-09-30 MED ORDER — NEBIVOLOL HCL 5 MG PO TABS: 5.0000 mg | ORAL_TABLET | Freq: Every day | ORAL | Status: DC

## 2014-09-30 MED ORDER — EZETIMIBE-SIMVASTATIN 10-40 MG PO TABS: 1.0000 | ORAL_TABLET | Freq: Every day | ORAL | Status: DC

## 2014-09-30 NOTE — Telephone Encounter (Signed)
Rx was sent to pharmacy electronically. 

## 2014-09-30 NOTE — Telephone Encounter (Signed)
Pt wants his pharmacy changed,so he need his medicine called in.Please call his Bystolic,Simvastatin,and Zetia.to [phone].

## 2014-10-01 ENCOUNTER — Telehealth: Payer: Self-pay | Admitting: Cardiovascular Disease

## 2014-10-01 MED ORDER — EZETIMIBE 10 MG PO TABS: 10.0000 mg | ORAL_TABLET | Freq: Every day | ORAL | Status: DC

## 2014-10-01 MED ORDER — SIMVASTATIN 40 MG PO TABS: 40.0000 mg | ORAL_TABLET | Freq: Every day | ORAL | Status: DC

## 2014-10-01 NOTE — Telephone Encounter (Signed)
Patient takes simvastatin 40 and zetia 10mg  separate. He states he has been taking this for years now. Med list was not updated to reflect this.   Rx was sent to pharmacy electronically.

## 2014-10-01 NOTE — Telephone Encounter (Signed)
Pt called in stating that he would like these medications called in to the [facility] in [facility]:  Bystolic 5mg   Zetia 10mg   Simvastatin 40mg  (instead of Zocor)  Thanks

## 2014-11-13 ENCOUNTER — Ambulatory Visit (HOSPITAL_COMMUNITY)
Admission: RE | Admit: 2014-11-13 | Discharge: 2014-11-13 | Disposition: A | Discharge status: Home / Self Care | Payer: BC Managed Care – PPO | Source: Ambulatory Visit | Attending: Cardiovascular Disease | Admitting: Cardiovascular Disease

## 2014-11-13 ENCOUNTER — Other Ambulatory Visit: Payer: Self-pay | Admitting: *Deleted

## 2014-11-13 ENCOUNTER — Other Ambulatory Visit (HOSPITAL_COMMUNITY): Payer: Self-pay | Admitting: Orthopedic Surgery

## 2014-11-13 DIAGNOSIS — M7989 Other specified soft tissue disorders: Secondary | ICD-10-CM

## 2014-11-13 DIAGNOSIS — M79605 Pain in left leg: Secondary | ICD-10-CM | POA: Diagnosis not present

## 2014-11-13 NOTE — Progress Notes (Signed)
Left Lower Extremity Venous Duplex Completed. No evidence for DVT or SVT. °Brianna L Mazza,RVT °

## 2014-11-24 ENCOUNTER — Telehealth (HOSPITAL_COMMUNITY): Payer: Self-pay | Admitting: *Deleted

## 2014-12-23 ENCOUNTER — Ambulatory Visit: Payer: Self-pay | Admitting: Pharmacist Clinician (PhC)/ Clinical Pharmacy Specialist

## 2014-12-23 DIAGNOSIS — R760 Raised antibody titer: Secondary | ICD-10-CM

## 2015-01-21 ENCOUNTER — Other Ambulatory Visit: Payer: Self-pay

## 2015-01-21 NOTE — Telephone Encounter (Addendum)
Called patient to notify him that he would have to pick up prescriptions to send to [facility]. He sounded perplexed - stated he received an email stating his medications were already shipped. Patient stated he'd call back.

## 2015-01-22 ENCOUNTER — Encounter: Payer: Self-pay | Admitting: Cardiovascular Disease

## 2015-01-23 ENCOUNTER — Other Ambulatory Visit: Payer: Self-pay | Admitting: *Deleted

## 2015-01-27 ENCOUNTER — Telehealth: Payer: Self-pay | Admitting: Cardiovascular Disease

## 2015-01-27 NOTE — Telephone Encounter (Signed)
[name] is calling to find out what medications that  Dr. Gwenlyn Found has for him , because he has so many trying to get them in order .   Thanks

## 2015-01-27 NOTE — Telephone Encounter (Signed)
Returned call to patient he stated he was told Zetia prescription would be left for him to pick up so he can send to [facility].Stated he is upset we don't fax prescriptions to [facility].Unable to locate zetia prescription,will send message to Dr.Berry's nurse [place] to mail zetia prescription to patient.

## 2015-01-29 ENCOUNTER — Other Ambulatory Visit: Payer: Self-pay | Admitting: Cardiovascular Disease

## 2015-01-29 NOTE — Telephone Encounter (Signed)
Attempted to call patient back regarding medications. Patient answered phone and hung up x2. Medications will not be printed.

## 2015-01-29 NOTE — Telephone Encounter (Signed)
Please call,pt says he still have not gotten his medicine.He needs it asap.See note from 01-27-15 please.Please call today.

## 2015-02-04 MED ORDER — EZETIMIBE 10 MG PO TABS: 10.0000 mg | ORAL_TABLET | Freq: Every day | ORAL | Status: DC

## 2015-02-04 NOTE — Telephone Encounter (Signed)
I spoke with patient.  He was very upset that he has not received the zetia prescription yet.  I apologized and printed the prescription and put it in the mail.

## 2015-03-10 ENCOUNTER — Encounter: Payer: Self-pay | Admitting: Cardiovascular Disease

## 2015-03-10 ENCOUNTER — Ambulatory Visit (INDEPENDENT_AMBULATORY_CARE_PROVIDER_SITE_OTHER): Payer: Self-pay | Admitting: Cardiovascular Disease

## 2015-03-10 VITALS — BP 116/74 | HR 79 | Ht 66.0 in | Wt 235.6 lb

## 2015-03-10 DIAGNOSIS — I2510 Atherosclerotic heart disease of native coronary artery without angina pectoris: Secondary | ICD-10-CM

## 2015-03-10 DIAGNOSIS — I2583 Coronary atherosclerosis due to lipid rich plaque: Secondary | ICD-10-CM

## 2015-03-10 DIAGNOSIS — I10 Essential (primary) hypertension: Secondary | ICD-10-CM

## 2015-03-10 NOTE — Assessment & Plan Note (Signed)
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

## 2015-03-10 NOTE — Assessment & Plan Note (Addendum)
History of hypertension the blood pressure acid today at 160/74. He is on losartan and Bystolic.  Continue current meds at current dosing

## 2015-03-10 NOTE — Patient Instructions (Signed)
Dr.Berry wants you to follow-up in: [place] will receive a reminder letter in the mail two months in advance. If you don't receive a letter, please call our office to schedule the follow-up appointment.

## 2015-03-10 NOTE — Progress Notes (Signed)
03/10/2015 Eduardo Butler   1951/07/28  [id number]  Primary Physician Gennette Pac, MD Primary Cardiologist: Lorretta Harp MD [name]   HPI:   The patient is a very pleasant 64 year old moderately overweight married Caucasian male, father of 73, grandfather to 2 grandchildren who I last saw in the office 12 months ago. He has a history of CAD status post coronary artery bypass grafting x4 in March of 2003. 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 [name] follows. His other problems include hyperlipidemia and erectile dysfunction. I catheterized him at [facility] August 17, 2006 revealing patent grafts with normal LV function. His last Myoview performed in February of last year or was nonischemic Dr. [name] follows his lipid profile. Since I saw him a year ago he has remained asymptomatic.  Current Outpatient Prescriptions  Medication Sig Dispense Refill  . ALPRAZolam (XANAX) 0.5 MG tablet Take 0.5 mg by mouth as needed for anxiety or sleep.    . Ascorbic Acid (VITAMIN C) 1000 MG tablet Take 1,000 mg by mouth daily.    [name] aspirin EC 81 MG tablet Take 81 mg by mouth daily.    [name] ezetimibe (ZETIA) 10 MG tablet Take 1 tablet (10 mg total) by mouth daily. 90 tablet 3  . folic acid (FOLVITE) 1 MG tablet Take 1 mg by mouth daily.     [name] losartan (COZAAR) 50 MG tablet Take 50 mg by mouth daily.    . metFORMIN (GLUCOPHAGE) 1000 MG tablet Take 1,000 mg by mouth 2 (two) times daily with a meal.    . nebivolol (BYSTOLIC) 5 MG tablet Take 1 tablet (5 mg total) by mouth daily. 30 tablet 5  . omeprazole (PRILOSEC) 40 MG capsule Take 40 mg by mouth daily as needed (heartburn).     [name] oxyCODONE-acetaminophen (PERCOCET) 10-325 MG per tablet Take 1 tablet by mouth every 4 (four) hours as needed for pain. 30 tablet 0  . Ranitidine HCl (ACID REDUCER PO) Take 1 tablet by mouth at bedtime  as needed (acid reflux).     . simvastatin (ZOCOR) 40 MG tablet Take 1 tablet (40 mg total) by mouth at bedtime. 30 tablet 5  . warfarin (COUMADIN) 4 MG tablet Take 4 mg by mouth at bedtime.     . [DISCONTINUED] dexlansoprazole (DEXILANT) 60 MG capsule Take 60 mg by mouth as needed.     . [DISCONTINUED] tadalafil (CIALIS) 5 MG tablet Take 5 mg by mouth as needed.      No current facility-administered medications for this visit.    No Known Allergies  History   Social History  . Marital Status: Married    Spouse Name: N/A  . Number of Children: N/A  . Years of Education: N/A   Occupational History  . Not on file.   Social History Main Topics  . Smoking status: Never Smoker   . Smokeless tobacco: Never Used  . Alcohol Use: No     Comment: none since 1979  . Drug Use: No  . Sexual Activity: Not on file   Other Topics Concern  . Not on file   Social History Narrative     Review of Systems: General: negative for chills, fever, night sweats or weight changes.  Cardiovascular: negative for chest pain, dyspnea on exertion, edema, orthopnea, palpitations, paroxysmal nocturnal dyspnea or shortness of breath Dermatological: negative for rash Respiratory: negative for cough or wheezing  Urologic: negative for hematuria Abdominal: negative for nausea, vomiting, diarrhea, bright red blood per rectum, melena, or hematemesis Neurologic: negative for visual changes, syncope, or dizziness All other systems reviewed and are otherwise negative except as noted above.    Blood pressure 116/74, pulse 79, height 5\' 6"  (1.676 m), weight 235 lb 9.6 oz (106.867 kg).  General appearance: alert and no distress Neck: no adenopathy, no carotid bruit, no JVD, supple, symmetrical, trachea midline and thyroid not enlarged, symmetric, no tenderness/mass/nodules Lungs: clear to auscultation bilaterally Heart: regular rate and rhythm, S1, S2 normal, no murmur, click, rub or gallop Extremities:  extremities normal, atraumatic, no cyanosis or edema  EKG normal/79 without ST or T-wave changes. 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   HYPERLIPIDEMIA History of hyperlipidemia on simvastatin 40 mg a day as well as Zetia followed by his PCP   Essential hypertension History of hypertension the blood pressure acid today at 160/74. He is on losartan and Bystolic.  Continue current meds at current dosing   Coronary artery disease History of coronary artery disease status post bypass grafting March 2003. [facility] revealing patent grafts and normal LV function. His last Myoview performed in February 2014 was nonischemic. He denies chest pain or shortness of breath.       Lorretta Harp MD FACP,FACC,FAHA, [facility] 03/10/2015 3:59 PM

## 2015-03-10 NOTE — Assessment & Plan Note (Signed)
History of hyperlipidemia on simvastatin 40 mg a day as well as Zetia followed by his PCP

## 2015-03-10 NOTE — Assessment & Plan Note (Signed)
History of coronary artery disease status post bypass grafting March 2003. [facility] revealing patent grafts and normal LV function. His last Myoview performed in February 2014 was nonischemic. He denies chest pain or shortness of breath.

## 2015-03-28 ENCOUNTER — Other Ambulatory Visit: Payer: Self-pay | Admitting: Cardiovascular Disease

## 2015-03-30 ENCOUNTER — Other Ambulatory Visit: Payer: Self-pay | Admitting: Cardiovascular Disease

## 2015-04-02 ENCOUNTER — Other Ambulatory Visit: Payer: Self-pay | Admitting: Cardiovascular Disease

## 2015-04-02 NOTE — Telephone Encounter (Signed)
Rx(s) sent to pharmacy electronically.  

## 2015-04-29 ENCOUNTER — Other Ambulatory Visit: Payer: Self-pay | Admitting: *Deleted

## 2015-04-29 ENCOUNTER — Other Ambulatory Visit: Payer: Self-pay | Admitting: Cardiovascular Disease

## 2015-04-29 MED ORDER — EZETIMIBE 10 MG PO TABS: 10.0000 mg | ORAL_TABLET | Freq: Every day | ORAL | Status: DC

## 2015-04-29 NOTE — Telephone Encounter (Signed)
Simvastatin refilled #30 with 11 refills 03/30/15

## 2015-05-01 ENCOUNTER — Telehealth: Payer: Self-pay | Admitting: Cardiovascular Disease

## 2015-05-01 MED ORDER — EZETIMIBE 10 MG PO TABS: 10.0000 mg | ORAL_TABLET | Freq: Every day | ORAL | Status: AC

## 2015-05-01 MED ORDER — EZETIMIBE 10 MG PO TABS: 10.0000 mg | ORAL_TABLET | Freq: Every day | ORAL | Status: DC

## 2015-05-01 NOTE — Telephone Encounter (Signed)
°  1. Which medications need to be refilled? Zetia   2. Which pharmacy is medication to be sent to?[facility]   3. Do they need a 30 day or 90 day supply? 90  4. Would they like a call back once the medication has been sent to the pharmacy? Yes, because he is out of this medication

## 2015-05-01 NOTE — Telephone Encounter (Signed)
Pt needed Rx sent to [facility] - called & informed him we could not fax or mail directly to pharmacy but could get him a printed Rx. He voiced understanding.  Obtained signature on printed Rx from Dr. Gwenlyn Found. Called pt back to inform, asked if he would like it mailed or would like to pick up - he stated he wanted the Rx mailed to him, with the understanding that this may not be taken by courier until Monday.  I confirmed his home address ([address] Pt aware Rx placed in outgoing mail today.

## 2015-10-02 ENCOUNTER — Other Ambulatory Visit: Payer: Self-pay | Admitting: Cardiovascular Disease

## 2015-10-02 MED ORDER — NEBIVOLOL HCL 5 MG PO TABS: 5.0000 mg | ORAL_TABLET | Freq: Every day | ORAL | Status: DC

## 2015-10-28 ENCOUNTER — Other Ambulatory Visit: Payer: Self-pay | Admitting: Cardiovascular Disease

## 2015-10-28 NOTE — Telephone Encounter (Signed)
°*  STAT* If patient is at the pharmacy, call can be transferred to refill team.   1. Which medications need to be refilled? (please list name of each medication and dose if known) Bystolic 5mg    2. Which pharmacy/location (including street and city if local pharmacy) is medication to be sent to?[facility]   3. Do they need a 30 day or 90 day supply? [place]

## 2015-10-29 MED ORDER — NEBIVOLOL HCL 5 MG PO TABS: 5.0000 mg | ORAL_TABLET | Freq: Every day | ORAL | Status: DC

## 2015-10-29 NOTE — Telephone Encounter (Addendum)
rx printed for Dr Gwenlyn Found to sign.  Dr Gwenlyn Found will be in the office tomorrow - script placed on JBs cart to be signed.   Offered samples, patient will pick up samples and script tomorrow afternoon.

## 2015-10-29 NOTE — Telephone Encounter (Signed)
Medication samples have been provided to the patient. Drug name: Bystolic 5 mg Qty: 28 tabs LOT: [id number] Exp.Date: 8/18 Samples left at front desk for patient pick-up. Patient notified.  Junie Avilla, [place] 11:04 AM 10/29/2015

## 2016-02-17 DIAGNOSIS — Z7901 Long term (current) use of anticoagulants: Secondary | ICD-10-CM | POA: Diagnosis not present

## 2016-02-17 DIAGNOSIS — E782 Mixed hyperlipidemia: Secondary | ICD-10-CM | POA: Diagnosis not present

## 2016-02-17 DIAGNOSIS — F419 Anxiety disorder, unspecified: Secondary | ICD-10-CM | POA: Diagnosis not present

## 2016-02-17 DIAGNOSIS — E1149 Type 2 diabetes mellitus with other diabetic neurological complication: Secondary | ICD-10-CM | POA: Diagnosis not present

## 2016-02-17 DIAGNOSIS — Z7984 Long term (current) use of oral hypoglycemic drugs: Secondary | ICD-10-CM | POA: Diagnosis not present

## 2016-02-17 DIAGNOSIS — I10 Essential (primary) hypertension: Secondary | ICD-10-CM | POA: Diagnosis not present

## 2016-02-17 DIAGNOSIS — D6861 Antiphospholipid syndrome: Secondary | ICD-10-CM | POA: Diagnosis not present

## 2016-03-07 DIAGNOSIS — M5411 Radiculopathy, occipito-atlanto-axial region: Secondary | ICD-10-CM | POA: Diagnosis not present

## 2016-03-07 DIAGNOSIS — M9901 Segmental and somatic dysfunction of cervical region: Secondary | ICD-10-CM | POA: Diagnosis not present

## 2016-03-08 DIAGNOSIS — M5411 Radiculopathy, occipito-atlanto-axial region: Secondary | ICD-10-CM | POA: Diagnosis not present

## 2016-03-08 DIAGNOSIS — M9901 Segmental and somatic dysfunction of cervical region: Secondary | ICD-10-CM | POA: Diagnosis not present

## 2016-03-14 DIAGNOSIS — M9901 Segmental and somatic dysfunction of cervical region: Secondary | ICD-10-CM | POA: Diagnosis not present

## 2016-03-14 DIAGNOSIS — M5411 Radiculopathy, occipito-atlanto-axial region: Secondary | ICD-10-CM | POA: Diagnosis not present

## 2016-03-16 DIAGNOSIS — M5411 Radiculopathy, occipito-atlanto-axial region: Secondary | ICD-10-CM | POA: Diagnosis not present

## 2016-03-16 DIAGNOSIS — M9901 Segmental and somatic dysfunction of cervical region: Secondary | ICD-10-CM | POA: Diagnosis not present

## 2016-03-17 DIAGNOSIS — M9901 Segmental and somatic dysfunction of cervical region: Secondary | ICD-10-CM | POA: Diagnosis not present

## 2016-03-17 DIAGNOSIS — M5411 Radiculopathy, occipito-atlanto-axial region: Secondary | ICD-10-CM | POA: Diagnosis not present

## 2016-03-18 ENCOUNTER — Ambulatory Visit: Payer: Self-pay | Admitting: Cardiovascular Disease

## 2016-03-21 DIAGNOSIS — M5411 Radiculopathy, occipito-atlanto-axial region: Secondary | ICD-10-CM | POA: Diagnosis not present

## 2016-03-21 DIAGNOSIS — M9901 Segmental and somatic dysfunction of cervical region: Secondary | ICD-10-CM | POA: Diagnosis not present

## 2016-03-22 DIAGNOSIS — M9901 Segmental and somatic dysfunction of cervical region: Secondary | ICD-10-CM | POA: Diagnosis not present

## 2016-03-22 DIAGNOSIS — M5411 Radiculopathy, occipito-atlanto-axial region: Secondary | ICD-10-CM | POA: Diagnosis not present

## 2016-03-23 DIAGNOSIS — Z7901 Long term (current) use of anticoagulants: Secondary | ICD-10-CM | POA: Diagnosis not present

## 2016-03-23 DIAGNOSIS — Z86718 Personal history of other venous thrombosis and embolism: Secondary | ICD-10-CM | POA: Diagnosis not present

## 2016-03-24 DIAGNOSIS — M9901 Segmental and somatic dysfunction of cervical region: Secondary | ICD-10-CM | POA: Diagnosis not present

## 2016-03-24 DIAGNOSIS — M5411 Radiculopathy, occipito-atlanto-axial region: Secondary | ICD-10-CM | POA: Diagnosis not present

## 2016-04-04 DIAGNOSIS — M9901 Segmental and somatic dysfunction of cervical region: Secondary | ICD-10-CM | POA: Diagnosis not present

## 2016-04-04 DIAGNOSIS — M5411 Radiculopathy, occipito-atlanto-axial region: Secondary | ICD-10-CM | POA: Diagnosis not present

## 2016-04-06 DIAGNOSIS — M5411 Radiculopathy, occipito-atlanto-axial region: Secondary | ICD-10-CM | POA: Diagnosis not present

## 2016-04-06 DIAGNOSIS — M9901 Segmental and somatic dysfunction of cervical region: Secondary | ICD-10-CM | POA: Diagnosis not present

## 2016-04-08 ENCOUNTER — Ambulatory Visit (INDEPENDENT_AMBULATORY_CARE_PROVIDER_SITE_OTHER): Payer: Medicare Other | Admitting: Cardiovascular Disease

## 2016-04-08 ENCOUNTER — Encounter: Payer: Self-pay | Admitting: Cardiovascular Disease

## 2016-04-08 VITALS — BP 114/78 | HR 55 | Ht 66.0 in | Wt 242.2 lb

## 2016-04-08 DIAGNOSIS — R0602 Shortness of breath: Secondary | ICD-10-CM

## 2016-04-08 DIAGNOSIS — I2583 Coronary atherosclerosis due to lipid rich plaque: Secondary | ICD-10-CM

## 2016-04-08 DIAGNOSIS — I2510 Atherosclerotic heart disease of native coronary artery without angina pectoris: Secondary | ICD-10-CM | POA: Diagnosis not present

## 2016-04-08 DIAGNOSIS — I10 Essential (primary) hypertension: Secondary | ICD-10-CM | POA: Diagnosis not present

## 2016-04-08 NOTE — Progress Notes (Signed)
04/08/2016 Eduardo Butler   1951-02-09  [id number]  Primary Physician Orpah Melter, MD Primary Cardiologist: Lorretta Harp MD [name]   HPI:  The patient is a very pleasant 65 year old moderately overweight married Caucasian male, father of 64, grandfather to 2 grandchildren who I last saw in the office 03/10/15. He has a history of CAD status post coronary artery bypass grafting x4 in March of 2003. 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 [name] follows. His other problems include hyperlipidemia and erectile dysfunction. I catheterized him at the [facility] August 17, 2006 revealing patent grafts with normal LV function. His last Myoview performed 01/04/13 was nonischemic Dr. Doyle Askew follows his lipid profile. Since I saw him a year ago he has denies chest pain but does complain of increasing dyspnea on exertion.   Current Outpatient Prescriptions  Medication Sig Dispense Refill  . ALPRAZolam (XANAX) 0.5 MG tablet Take 0.5 mg by mouth as needed for anxiety or sleep.    . Ascorbic Acid (VITAMIN C) 1000 MG tablet Take 1,000 mg by mouth daily.    [name] aspirin EC 81 MG tablet Take 81 mg by mouth daily.    [name] ezetimibe (ZETIA) 10 MG tablet Take 1 tablet (10 mg total) by mouth daily. [id number] tablet 3  . folic acid (FOLVITE) 1 MG tablet Take 1 mg by mouth daily.     [name] glimepiride (AMARYL) 4 MG tablet Take 4 mg by mouth daily with breakfast.    . hydrochlorothiazide (MICROZIDE) 12.5 MG capsule Take 12.5 mg by mouth daily as needed.    [name] losartan (COZAAR) 50 MG tablet Take 1 tablet (50 mg total) by mouth daily. 30 tablet 11  . metFORMIN (GLUCOPHAGE) 1000 MG tablet Take 1,000 mg by mouth 2 (two) times daily with a meal.    . nebivolol (BYSTOLIC) 5 MG tablet Take 1 tablet (5 mg total) by mouth daily. 90 tablet 1  . omeprazole (PRILOSEC) 40 MG capsule Take 40 mg by mouth daily as needed  (heartburn).     . Probiotic Product (PROBIOTIC DAILY PO) Take 1 tablet by mouth daily.    . simvastatin (ZOCOR) 40 MG tablet TAKE 1 TABLET (40 MG TOTAL) BY MOUTH AT BEDTIME. 30 tablet 11  . warfarin (COUMADIN) 5 MG tablet TK 1 T PO ONCE D  1  . [DISCONTINUED] dexlansoprazole (DEXILANT) 60 MG capsule Take 60 mg by mouth as needed.     . [DISCONTINUED] tadalafil (CIALIS) 5 MG tablet Take 5 mg by mouth as needed.      No current facility-administered medications for this visit.    No Known Allergies  Social History   Social History  . Marital Status: Married    Spouse Name: N/A  . Number of Children: N/A  . Years of Education: N/A   Occupational History  . Not on file.   Social History Main Topics  . Smoking status: Never Smoker   . Smokeless tobacco: Never Used  . Alcohol Use: No     Comment: none since 1979  . Drug Use: No  . Sexual Activity: Not on file   Other Topics Concern  . Not on file   Social History Narrative     Review of Systems: General: negative for chills, fever, night sweats or weight changes.  Cardiovascular: negative for chest pain, dyspnea on exertion, edema, orthopnea, palpitations, paroxysmal nocturnal dyspnea or shortness of breath Dermatological:  negative for rash Respiratory: negative for cough or wheezing Urologic: negative for hematuria Abdominal: negative for nausea, vomiting, diarrhea, bright red blood per rectum, melena, or hematemesis Neurologic: negative for visual changes, syncope, or dizziness All other systems reviewed and are otherwise negative except as noted above.    Blood pressure 114/78, pulse 55, height 5\' 6"  (1.676 m), weight 242 lb 4 oz (109.884 kg).  General appearance: alert and no distress Neck: no adenopathy, no carotid bruit, no JVD, supple, symmetrical, trachea midline and thyroid not enlarged, symmetric, no tenderness/mass/nodules Lungs: clear to auscultation bilaterally Heart: regular rate and rhythm, S1, S2  normal, no murmur, click, rub or gallop Extremities: extremities normal, atraumatic, no cyanosis or edema  EKG sinus bradycardia 55 ST or T-wave changes. I Personally reviewed this EKG  ASSESSMENT AND PLAN:   HYPERLIPIDEMIA History of hyperlipidemia on statin therapy and Zetia followed by his PCP  PULMONARY EMBOLISM AND INFARCTION History of recurrent pulmonary emboli in the past on Coumadin anticoagulation for life because of having a lupus anticoagulant.  DYSPNEA History of dyspnea more prominent over the last several weeks or months after beginning glimepiride. He also has had to take increasing when necessary doses of diuretics. He denies chest pain but his exercise tolerancehas diminished as has his energy level. I am going to get routine lab work on him as well as a 2-D echocardiogram.  Essential hypertension History of hypertension blood pressure measured at 160/74. He is on hydrochlorothiazide, losartan. Continue current meds at current dosing  Coronary artery disease History of coronary artery disease status post coronary artery bypass grafting X 29 January 2002. I catheterized him 08/17/06 revealing patent grafts and normal LV function His most recent Myoview stress test performed 01/04/13 was nonischemic.      Lorretta Harp MD FACP,FACC,FAHA, [facility] 04/08/2016 8:29 AM

## 2016-04-08 NOTE — Patient Instructions (Signed)
Medication Instructions:  Your physician recommends that you continue on your current medications as directed. Please refer to the Current Medication list given to you today.   Labwork: Your physician recommends that you return for lab work TODAY. The lab can be found on the FIRST FLOOR of out building in [address]   Testing/Procedures: Your physician has requested that you have an echocardiogram. Echocardiography is a painless test that uses sound waves to create images of your heart. It provides your doctor with information about the size and shape of your heart and how well your heart's chambers and valves are working. This procedure takes approximately one hour. There are no restrictions for this procedure.    Follow-Up: Your physician recommends that you schedule a follow-up appointment in 3-4 months with Dr Gwenlyn Found.   Any Other Special Instructions Will Be Listed Below (If Applicable).     If you need a refill on your cardiac medications before your next appointment, please call your pharmacy.

## 2016-04-08 NOTE — Assessment & Plan Note (Signed)
History of dyspnea more prominent over the last several weeks or months after beginning glimepiride. He also has had to take increasing when necessary doses of diuretics. He denies chest pain but his exercise tolerancehas diminished as has his energy level. I am going to get routine lab work on him as well as a 2-D echocardiogram.

## 2016-04-08 NOTE — Assessment & Plan Note (Addendum)
History of hypertension blood pressure measured at 160/74. He is on hydrochlorothiazide, losartan. Continue current meds at current dosing

## 2016-04-08 NOTE — Assessment & Plan Note (Signed)
History of recurrent pulmonary emboli in the past on Coumadin anticoagulation for life because of having a lupus anticoagulant.

## 2016-04-08 NOTE — Assessment & Plan Note (Signed)
History of coronary artery disease status post coronary artery bypass grafting X 29 January 2002. I catheterized him 08/17/06 revealing patent grafts and normal LV function His most recent Myoview stress test performed 01/04/13 was nonischemic.

## 2016-04-08 NOTE — Assessment & Plan Note (Signed)
History of hyperlipidemia on statin therapy and Zetia followed by his PCP 

## 2016-04-11 DIAGNOSIS — Z7901 Long term (current) use of anticoagulants: Secondary | ICD-10-CM | POA: Diagnosis not present

## 2016-04-11 DIAGNOSIS — M5411 Radiculopathy, occipito-atlanto-axial region: Secondary | ICD-10-CM | POA: Diagnosis not present

## 2016-04-11 DIAGNOSIS — M9901 Segmental and somatic dysfunction of cervical region: Secondary | ICD-10-CM | POA: Diagnosis not present

## 2016-04-12 ENCOUNTER — Other Ambulatory Visit: Payer: Self-pay

## 2016-04-12 DIAGNOSIS — R0602 Shortness of breath: Secondary | ICD-10-CM | POA: Diagnosis not present

## 2016-04-12 DIAGNOSIS — I10 Essential (primary) hypertension: Secondary | ICD-10-CM | POA: Diagnosis not present

## 2016-04-12 MED ORDER — LOSARTAN POTASSIUM 50 MG PO TABS: 50.0000 mg | ORAL_TABLET | Freq: Every day | ORAL | Status: DC

## 2016-04-12 MED ORDER — SIMVASTATIN 40 MG PO TABS: 40.0000 mg | ORAL_TABLET | Freq: Every day | ORAL | Status: DC

## 2016-04-12 NOTE — Telephone Encounter (Signed)
Rx(s) sent to pharmacy electronically.  

## 2016-04-12 NOTE — Addendum Note (Signed)
Addended by: Diana Eves on: 04/12/2016 12:09 PM   Modules accepted: Orders

## 2016-04-13 DIAGNOSIS — M9901 Segmental and somatic dysfunction of cervical region: Secondary | ICD-10-CM | POA: Diagnosis not present

## 2016-04-13 DIAGNOSIS — M5411 Radiculopathy, occipito-atlanto-axial region: Secondary | ICD-10-CM | POA: Diagnosis not present

## 2016-04-13 LAB — CBC WITH DIFFERENTIAL/PLATELET
Basophils Absolute: 0 cells/uL (ref 0–200)
Basophils Relative: 0 %
Eosinophils Absolute: 75 cells/uL (ref 15–500)
Eosinophils Relative: 1 %
HCT: 38.9 % (ref 38.5–50.0)
Hemoglobin: 12.6 g/dL — ABNORMAL LOW (ref 13.2–17.1)
Lymphocytes Relative: 25 %
Lymphs Abs: 1875 cells/uL (ref 850–3900)
MCH: 27.5 pg (ref 27.0–33.0)
MCHC: 32.4 g/dL (ref 32.0–36.0)
MCV: 84.9 fL (ref 80.0–100.0)
MPV: 10.7 fL (ref 7.5–12.5)
Monocytes Absolute: 600 cells/uL (ref 200–950)
Monocytes Relative: 8 %
Neutro Abs: 4950 cells/uL (ref 1500–7800)
Neutrophils Relative %: 66 %
Platelets: 248 10*3/uL (ref 140–400)
RBC: 4.58 MIL/uL (ref 4.20–5.80)
RDW: 14.5 % (ref 11.0–15.0)
WBC: 7.5 10*3/uL (ref 3.8–10.8)

## 2016-04-13 LAB — COMPREHENSIVE METABOLIC PANEL
ALT: 23 U/L (ref 9–46)
AST: 20 U/L (ref 10–35)
Albumin: 4.2 g/dL (ref 3.6–5.1)
Alkaline Phosphatase: 49 U/L (ref 40–115)
BUN: 16 mg/dL (ref 7–25)
CO2: 27 mmol/L (ref 20–31)
Calcium: 8.9 mg/dL (ref 8.6–10.3)
Chloride: 98 mmol/L (ref 98–110)
Creat: 0.9 mg/dL (ref 0.70–1.25)
Glucose, Bld: 269 mg/dL — ABNORMAL HIGH (ref 65–99)
Potassium: 4.5 mmol/L (ref 3.5–5.3)
Sodium: 136 mmol/L (ref 135–146)
Total Bilirubin: 0.4 mg/dL (ref 0.2–1.2)
Total Protein: 6.7 g/dL (ref 6.1–8.1)

## 2016-04-13 LAB — BRAIN NATRIURETIC PEPTIDE: Brain Natriuretic Peptide: 42.6 pg/mL (ref <100)

## 2016-04-26 ENCOUNTER — Other Ambulatory Visit (HOSPITAL_COMMUNITY): Payer: Medicare Other

## 2016-05-02 ENCOUNTER — Other Ambulatory Visit: Payer: Self-pay

## 2016-05-02 ENCOUNTER — Ambulatory Visit (HOSPITAL_COMMUNITY): Payer: Medicare Other | Attending: Cardiology

## 2016-05-02 DIAGNOSIS — E669 Obesity, unspecified: Secondary | ICD-10-CM | POA: Diagnosis not present

## 2016-05-02 DIAGNOSIS — Z6839 Body mass index (BMI) 39.0-39.9, adult: Secondary | ICD-10-CM | POA: Insufficient documentation

## 2016-05-02 DIAGNOSIS — E785 Hyperlipidemia, unspecified: Secondary | ICD-10-CM | POA: Diagnosis not present

## 2016-05-02 DIAGNOSIS — I2510 Atherosclerotic heart disease of native coronary artery without angina pectoris: Secondary | ICD-10-CM | POA: Diagnosis not present

## 2016-05-02 DIAGNOSIS — R0602 Shortness of breath: Secondary | ICD-10-CM

## 2016-05-02 DIAGNOSIS — Z951 Presence of aortocoronary bypass graft: Secondary | ICD-10-CM | POA: Insufficient documentation

## 2016-05-02 DIAGNOSIS — I119 Hypertensive heart disease without heart failure: Secondary | ICD-10-CM | POA: Insufficient documentation

## 2016-05-02 DIAGNOSIS — I340 Nonrheumatic mitral (valve) insufficiency: Secondary | ICD-10-CM | POA: Diagnosis not present

## 2016-05-02 DIAGNOSIS — I10 Essential (primary) hypertension: Secondary | ICD-10-CM | POA: Diagnosis not present

## 2016-05-02 DIAGNOSIS — R0600 Dyspnea, unspecified: Secondary | ICD-10-CM | POA: Diagnosis present

## 2016-05-02 LAB — ECHOCARDIOGRAM COMPLETE
Ao-asc: 35 cm
E decel time: 239 msec
E/e' ratio: 9.11
FS: 32 % (ref 28–44)
IVS/LV PW RATIO, ED: 1.04
LA ID, A-P, ES: 40 cm
LA diam end sys: 40 cm
LA diam index: 1.73 cm/m2
LA vol A4C: 55 ml
LA vol index: 27.2 mL/m2
LA vol: 63 cm3
LV E/e' medial: 9.11
LV E/e'average: 9.11
LV PW d: 10.4 mm — AB (ref 0.6–1.1)
LV dias vol index: 50 mL/m2
LV dias vol: 115 mL (ref 62–150)
LV e' LATERAL: 13.5 cm/s
LV sys vol index: 18 mL/m2
LV sys vol: 42 mL (ref 21–61)
LVOT SV: 104 cm3
LVOT VTI: 27.4 cm
LVOT area: 3.8 cm2
LVOT diameter: 22 mm
LVOT peak grad rest: 6 mmHg
LVOT peak vel: 123 cm/s
Lateral S' vel: 15.1 cm/s
MV Dec: 239
MV Peak grad: 6 mmHg
MV pk A vel: 62.2 m/s
MV pk E vel: 123 m/s
RV sys press: 27 mmHg
Reg peak vel: 245 cm/s
Simpson's disk: 63
Stroke v: 73 ml
TDI e' lateral: 13.5
TDI e' medial: 11.2
TR max vel: 245 m/s

## 2016-05-03 ENCOUNTER — Telehealth: Payer: Self-pay | Admitting: Cardiovascular Disease

## 2016-05-03 NOTE — Telephone Encounter (Signed)
Spoke with pt, aware echo results have not reviewed by md yet, will call once reviewed.

## 2016-05-03 NOTE — Telephone Encounter (Signed)
New message ° ° ° ° ° °Calling to get test results °

## 2016-05-04 ENCOUNTER — Telehealth: Payer: Self-pay | Admitting: Cardiovascular Disease

## 2016-05-04 NOTE — Telephone Encounter (Signed)
Patient phoned yesterday wanting Echo results and was told not reviewed at this time Will forward to Dr Gwenlyn Found

## 2016-05-04 NOTE — Telephone Encounter (Signed)
New message    The pt is wanting to go over the Echo results, the test was done on Monday.

## 2016-05-04 NOTE — Telephone Encounter (Signed)
Dr Gwenlyn Found notified and to read results. Will call pt once results are read.

## 2016-05-05 ENCOUNTER — Ambulatory Visit (INDEPENDENT_AMBULATORY_CARE_PROVIDER_SITE_OTHER): Payer: Medicare Other | Admitting: Physician Assistant

## 2016-05-05 ENCOUNTER — Encounter: Payer: Self-pay | Admitting: Physician Assistant

## 2016-05-05 VITALS — BP 119/70 | HR 51 | Ht 66.0 in | Wt 245.0 lb

## 2016-05-05 DIAGNOSIS — I10 Essential (primary) hypertension: Secondary | ICD-10-CM

## 2016-05-05 DIAGNOSIS — R0602 Shortness of breath: Secondary | ICD-10-CM | POA: Diagnosis not present

## 2016-05-05 DIAGNOSIS — I25709 Atherosclerosis of coronary artery bypass graft(s), unspecified, with unspecified angina pectoris: Secondary | ICD-10-CM

## 2016-05-05 DIAGNOSIS — I2510 Atherosclerotic heart disease of native coronary artery without angina pectoris: Secondary | ICD-10-CM

## 2016-05-05 MED ORDER — FUROSEMIDE 20 MG PO TABS: 20.0000 mg | ORAL_TABLET | Freq: Every day | ORAL | Status: DC | PRN

## 2016-05-05 MED ORDER — NEBIVOLOL HCL 2.5 MG PO TABS
2.5000 mg | ORAL_TABLET | Freq: Every day | ORAL | Status: DC
Start: 2016-05-05 — End: 2016-05-12

## 2016-05-05 NOTE — Telephone Encounter (Signed)
Follow up      Calling to get test results from [facility]

## 2016-05-05 NOTE — Telephone Encounter (Signed)
Spoke to patient.  ECHO Result given . Verbalized understanding  

## 2016-05-05 NOTE — Telephone Encounter (Signed)
Returned call to patient. Results called to pt. Pt verbalized understanding.  He then presented with some c/o fatigue, SOB with exertion, and swelling in his left anke/foot.  Denies cough. He says its been getting worse over the past 2-3 months. He is having trouble doing his normal activites such as walking on the golf [place] and playing with his grandson. He has a Rx that is 65 years old for HCTZ 12.5 mg daily as needed that was prescribed by his PCP and only has two pills left. Until recently, he hardly used the Rx but now has been taking it daily and it almost out. Patient concerned and wants to see Dr Gwenlyn Found for an appt this week. Instead, appt made with [facility] for today at 1500. Patient verbalized understanding and will be at [facility] [address] office for appt at 1500 today.

## 2016-05-05 NOTE — Progress Notes (Signed)
CARDIOLOGY OFFICE NOTE  Date:  05/05/2016    Eduardo Butler Date of Birth: 04/22/1951 Medical Record #[id number]  PCP:  Orpah Melter, MD  Cardiologist:  Dr. Gwenlyn Found  Chief Complaint  Patient presents with  . Follow-up    seen for Dr. Gwenlyn Found in [facility]    History of Present Illness: Eduardo Butler is a 65 y.o. male who presents today for Cardiology follow-up. Patient has PMH of CAD s/p 4v CABG (sequential SVG to acute marginal and distal RCA, free radial to LCx/OM, LIMA to LAD) 2003, recurrent PE 2/2 lupus anticoagulant on life long Coumadin, HLD and ED. His last cardiac catheterization was on 08/17/2006 revealing patent grafts with normal LV function. His last Myoview performed on 01/04/2013 was nonischemic. He was last seen by Dr. Gwenlyn Found on 04/08/2016 at which time he complained of dyspnea more prominently in the last several month after beginning glimepiride. He was also taking increasing doses of PRN diuretics. Echocardiogram was obtained On 05/02/2016 which showed EF 55-60%, no RWMA, mild biatrial enlargement.  He presents today for follow-up. He says since he saw Dr. Gwenlyn Found his symptom has significantly worsened. He says he can barely walk 100 feet without getting short of breath. He is adamant he has been compliant with his Coumadin therapy his INR level 2 weeks ago was 2.7. I doubt he is having recurrent PE given therapeutic INR. He says he never had any chest pain with his previous angina, he says his original symptom was cardiac arrest. Although his symptom started after initiation glymepiride, despite stopping the glymepiride, his symptom has not improved. Alternative explanation for his exertional dyspnea include obesity, pulmonary issue versus angina equivalent. He never smoked in the past, possibility of COPD is relatively low. He is obese, however in this situation, I am concerned for angina equivalent. His previous cath was in 2007. Last Myoview was seen 2014 which was negative. He  also complained of dizziness and fatigue as well but denies any presyncope or syncope.   Past Medical History  Diagnosis Date  . Anxiety   . Hyperlipidemia   . Hypertension   . Peroneal DVT (deep venous thrombosis) ([place])   . CHF (congestive heart failure) ([place])   . Coronary artery disease   . Shortness of breath   . H/O echocardiogram 12/02/2010    EF 55% , no significant abnormalities  . History of stress test 01/04/2013    Lexiscan Myoview ; scattered PVC's ; LV EF 54% ; Normal stress test   . H/O cardiac catheterization 08/25/2006    normal cath-patent grafts  . H/O Doppler ultrasound 02/21/2011    lower ext.venous doppler,, no treatment except support stockings are recommended if clinically indicated  . Diabetes ([place])   . Obesity   . Arthritis     Past Surgical History  Procedure Laterality Date  . Shoulder surgery    . Bypass graft  2003    4 bypass   . Pelvic laparoscopy  2005  . Coronary artery bypass graft  2003  . Hernia repair    . Umbilical hernia repair  11/17/2011    Procedure: HERNIA REPAIR UMBILICAL ADULT;  Surgeon: Harl Bowie, MD;  Location: [place];  Service: General;  Laterality: N/A;  umbilical hernia repair with mesh  . Cardiac catheterization  02-13-2002    S/P sudden cardiac death; three vessel coronary artery disease; preserved overall left ventricular systolic function with loculated mild to moderate anterolateral hypokinesis; no mitral  regurgitation noted  . Lumbar laminectomy/ decompression with met-rx Left 07/08/2014    Procedure: Left Lumbar Four-Five Metrex foraminal microdiskectomy;  Surgeon: Consuella Lose, MD;  Location: [facility];  Service: Neurosurgery;  Laterality: Left;  Left Lumbar Four-Five Metrex foraminal microdiskectomy     Medications: Current Outpatient Prescriptions  Medication Sig Dispense Refill  . ALPRAZolam (XANAX) 0.5 MG tablet Take 0.5 mg by mouth as needed for anxiety or sleep.    . Ascorbic  Acid (VITAMIN C) 1000 MG tablet Take 1,000 mg by mouth daily.    [name] aspirin EC 81 MG tablet Take 81 mg by mouth daily.    [name] ezetimibe (ZETIA) 10 MG tablet Take 1 tablet (10 mg total) by mouth daily. 950 tablet 3  . folic acid (FOLVITE) 1 MG tablet Take 1 mg by mouth daily.     [name] losartan (COZAAR) 50 MG tablet Take 1 tablet (50 mg total) by mouth daily. 30 tablet 11  . metFORMIN (GLUCOPHAGE) 1000 MG tablet Take 1,000 mg by mouth 2 (two) times daily with a meal.    . omeprazole (PRILOSEC) 40 MG capsule Take 40 mg by mouth daily as needed (heartburn).     . pioglitazone (ACTOS) 15 MG tablet Take 15 mg by mouth daily.    . simvastatin (ZOCOR) 40 MG tablet Take 1 tablet (40 mg total) by mouth daily at 6 PM. 30 tablet 11  . warfarin (COUMADIN) 5 MG tablet Take 5 mg by mouth as directed.    . furosemide (LASIX) 20 MG tablet Take 1 tablet (20 mg total) by mouth daily as needed. 30 tablet 5  . nebivolol (BYSTOLIC) 2.5 MG tablet Take 1 tablet (2.5 mg total) by mouth daily. 90 tablet 3  . [DISCONTINUED] dexlansoprazole (DEXILANT) 60 MG capsule Take 60 mg by mouth as needed.     . [DISCONTINUED] tadalafil (CIALIS) 5 MG tablet Take 5 mg by mouth as needed.      No current facility-administered medications for this visit.    Allergies: No Known Allergies  Social History: The patient  reports that he has never smoked. He has never used smokeless tobacco. He reports that he does not drink alcohol or use illicit drugs.   Family History: The patient's family history includes Diabetes in his brother and mother; Hypertension in his brother. There is no history of Heart attack or Stroke.   Review of Systems: Please see the history of present illness.   Otherwise, the review of systems is positive for DOE, dizziness.   All other systems are reviewed and negative.   Physical Exam: VS:  BP 119/70 mmHg  Pulse 51  Ht '5\' 6"'  (1.676 m)  Wt 245 lb (111.131 kg)  BMI 39.56 kg/m2 .  BMI Body mass index is 39.56  kg/(m^2).  Wt Readings from Last 3 Encounters:  05/05/16 245 lb (111.131 kg)  04/08/16 242 lb 4 oz (109.884 kg)  03/10/15 235 lb 9.6 oz (106.867 kg)    General: Pleasant. Well developed, well nourished and in no acute distress.  HEENT: Normal. Neck: Supple, no JVD, carotid bruits, or masses noted.  Cardiac: Regular rate and rhythm. No murmurs, rubs, or gallops. No edema.  Respiratory:  Lungs are clear to auscultation bilaterally with normal work of breathing.  GI: Soft and nontender.  MS: No deformity or atrophy. Gait and ROM intact. Skin: Warm and dry. Color is normal.  Neuro:  Strength and sensation are intact and no gross focal deficits noted.  Psych: Alert,  appropriate and with normal affect.   LABORATORY DATA:  EKG:  EKG is ordered today. This demonstrates sinus bradycardia with HR 51.  Lab Results  Component Value Date   WBC 7.5 04/12/2016   HGB 12.6* 04/12/2016   HCT 38.9 04/12/2016   PLT 248 04/12/2016   GLUCOSE 269* 04/12/2016   CHOL  06/26/2007    140        ATP III CLASSIFICATION:  <200     mg/dL   Desirable  200-239  mg/dL   Borderline High  >=240    mg/dL   High   TRIG 124 06/26/2007   HDL 39* 06/26/2007   LDLCALC  06/26/2007    76        Total Cholesterol/HDL:CHD Risk Coronary Heart Disease Risk Table                     Men   Women  1/2 Average Risk   3.4   3.3   ALT 23 04/12/2016   AST 20 04/12/2016   NA 136 04/12/2016   K 4.5 04/12/2016   CL 98 04/12/2016   CREATININE 0.90 04/12/2016   BUN 16 04/12/2016   CO2 27 04/12/2016   TSH 1.454 Test methodology is 3rd generation TSH 06/26/2007   INR 0.95 07/08/2014    BNP (last 3 results)  Recent Labs  04/12/16 1124  BNP 42.6      Other Studies Reviewed Today:  Echocardiogram 05/02/2016 LV EF: 55% - 60%  ------------------------------------------------------------------- Indications: (R06.02).  ------------------------------------------------------------------- History: PMH:  Acquired from the patient and from the patient&'s chart. Dyspnea. Coronary artery disease. Risk factors: Hypertension. Obese. Dyslipidemia.  ------------------------------------------------------------------- Study Conclusions  - Left ventricle: The cavity size was normal. Wall thickness was  normal. Systolic function was normal. The estimated ejection  fraction was in the range of 55% to 60%. Wall motion was normal;  there were no regional wall motion abnormalities. Left  ventricular diastolic function parameters were normal. - Aortic valve: There was no stenosis. - Mitral valve: Mildly calcified annulus. There was trivial  regurgitation. - Left atrium: The atrium was mildly dilated. - Right ventricle: The cavity size was normal. Systolic function  was normal. - Right atrium: The atrium was mildly dilated. - Tricuspid valve: Peak RV-RA gradient (S): 24 mm Hg. - Pulmonary arteries: PA peak pressure: 27 mm Hg (S). - Inferior vena cava: The vessel was normal in size. The  respirophasic diameter changes were in the normal range (>= 50%),  consistent with normal central venous pressure.  Impressions:  - Normal LV size and systolic function, EF 53-97%. Normal diastolic  function. Normal RV size and systolic function. Mild biatrial  enlargement.   Myoview 01/04/2013 Impression Exercise Capacity: Good exercise capacity. BP Response: Normal blood pressure response. Clinical Symptoms: No significant symptoms noted. ECG Impression: No significant ST segment change suggestive of ischemia. Comparison with Prior Nuclear Study: No significant change from previous study  Overall Impression: Normal stress nuclear study.  LV Wall Motion: NL LV Function; NL Wall Motion    Assessment/Plan:  1. Dyspnea: more noticeable with exertion  - worsening symptom for 3 months  - recent echo is normal, however his symptom has worsened despite stopping what he thought was the  offending drug (glymepiride). He does have 1+ pitting edema, but his lung is clear, doubt pulm edema. I have switched his PRN HCTZ to PRN lasix. I do not think his symptom is caused by significant fluid overload. Given exertional component,  I wish to obtain a treadmill myoview which also will give [place] insight into his exercise ability. He wish to clear with Dr. Gwenlyn Found before ordering it.  2. CAD s/p 4v CABG (sequential SVG to acute marginal and distal RCA, free radial to LCx/OM, LIMA to LAD) 2003  - see above #1  3. recurrent PE 2/2 lupus anticoagulant on life long Coumadin: per patient, INR was therapeutic 2 weeks ago  4. Bradycardia: HR 51 today, given his fatigue, will decrease bystolic to 7.6BO daily  Current medicines are reviewed with the patient today.  The patient does not have concerns regarding medicines other than what has been noted above.  The following changes have been made:  See above.  Labs/ tests ordered today include:    Orders Placed This Encounter  Procedures  . EKG 12-Lead     Disposition:   FU with Dr. Gwenlyn Found in 3 months.   Patient is agreeable to this plan and will call if any problems develop in the interim.   Signed: Almyra Deforest PA-C 05/05/2016 5:44 PM  [facility] [address] [place], [place]  [address] Phone: [phone] Fax: [phone]

## 2016-05-05 NOTE — Patient Instructions (Signed)
Medication Instructions:  1) DECREASE Bystolic to 2.5mg  once daily 2) STOP taking HCTZ 3) START Lasix 20mg  once daily as needed for swelling.  Labwork: None  Testing/Procedures: None  Follow-Up: Keep follow up as currently scheduled with Dr. Gwenlyn Found.  Any Other Special Instructions Will Be Listed Below (If Applicable).     If you need a refill on your cardiac medications before your next appointment, please call your pharmacy.

## 2016-05-09 ENCOUNTER — Telehealth: Payer: Self-pay

## 2016-05-09 ENCOUNTER — Other Ambulatory Visit: Payer: Self-pay | Admitting: *Deleted

## 2016-05-09 ENCOUNTER — Telehealth: Payer: Self-pay | Admitting: *Deleted

## 2016-05-09 DIAGNOSIS — R0600 Dyspnea, unspecified: Secondary | ICD-10-CM

## 2016-05-09 DIAGNOSIS — R0609 Other forms of dyspnea: Principal | ICD-10-CM

## 2016-05-09 DIAGNOSIS — R0602 Shortness of breath: Secondary | ICD-10-CM

## 2016-05-09 NOTE — Telephone Encounter (Signed)
Prior auth for Bystolic 2.5mg  sent to [facility].

## 2016-05-09 NOTE — Telephone Encounter (Signed)
Left message to call back  

## 2016-05-09 NOTE — Telephone Encounter (Signed)
Pt returned call. Pt agreeable to myoview.  Went over instructions.  Pt verbalized understanding and repeated instructions to me. Advised pt that someone from scheduling will call to make appt for myoview.

## 2016-05-09 NOTE — Telephone Encounter (Signed)
-----   Message from [place], [place] sent at 05/09/2016  7:44 AM EDT ----- Regarding: schedule outpatient treadmill stress test Please inform patient that I have discussed his case with his primary cardiologist, Dr Gwenlyn Found, we plan to proceed with treadmill nuclear stress test. He needs to hold his Bystolic the night before and the morning of the test as the medication slow down the heart rate which make reaching target heart rate difficult. No food or drink in AM of the test, sips of water with meds ok (except Bystolic).  His weight is 245 lbs, it should be 1 day treadmill nuclear stress test  Thanks  Isaac Laud

## 2016-05-10 ENCOUNTER — Telehealth: Payer: Self-pay

## 2016-05-10 NOTE — Telephone Encounter (Signed)
Insurance has just denied coverage of his Bystolic 2.5mg . He needs to try Atenolol, Bisoprolol, Carvedilol or Metoprolol.

## 2016-05-12 ENCOUNTER — Telehealth: Payer: Self-pay | Admitting: Cardiovascular Disease

## 2016-05-12 DIAGNOSIS — Z7901 Long term (current) use of anticoagulants: Secondary | ICD-10-CM | POA: Diagnosis not present

## 2016-05-12 DIAGNOSIS — Z86718 Personal history of other venous thrombosis and embolism: Secondary | ICD-10-CM | POA: Diagnosis not present

## 2016-05-12 MED ORDER — CARVEDILOL 3.125 MG PO TABS: 3.1250 mg | ORAL_TABLET | Freq: Two times a day (BID) | ORAL | Status: DC

## 2016-05-12 NOTE — Telephone Encounter (Signed)
Follow-up   Pt returning the nurses call

## 2016-05-12 NOTE — Telephone Encounter (Signed)
Received a message from [place], re: pt Bystolic.  Pts insurance has denied coverage, stating that pt needed to try Coreg 1st.  Per Almyra Deforest, PA-C, we can try Coreg 3.125 mg taking 1 tablet twice a day. I called pt to inform him and left a message for him to call me back.

## 2016-05-12 NOTE — Telephone Encounter (Signed)
Returned call to pt regarding message: Jeanann Lewandowsky, RMA at 05/12/2016 10:28 AM     Status: Signed       Expand All Collapse All   Received a message from [place], re: pt Bystolic. Pts insurance has denied coverage, stating that pt needed to try Coreg 1st. Per Almyra Deforest, PA-C, we can try Coreg 3.125 mg taking 1 tablet twice a day. I called pt to inform him and left a message for him to call me back.             Almyra Deforest, [place] at 05/12/2016 9:44 AM     Status: Signed       Expand All Collapse All   We can try coreg 3.125mg  BID            Connye Burkitt Reiland, LPN at [id number] PM     Status: Signed       Expand All Collapse All   Insurance has just denied coverage of his Bystolic 2.5mg . He needs to try Atenolol, Bisoprolol, Carvedilol or Metoprolol.       Gave Patient information that his insurance would not cover the bystolic until he tried alternative medications. He said he actually buys his bystolic from [facility], so he does not use his insurance for the medication. He did say if the carvedilol worked he would rather get it from [facility] and use his insurance. Patient verbalized understanding to take coreg 3.125mg , 1 tablet by mouth twice a day.  Rx sent to verified pharmacy.

## 2016-05-12 NOTE — Telephone Encounter (Signed)
We can try coreg 3.125mg  BID

## 2016-05-14 DIAGNOSIS — H00015 Hordeolum externum left lower eyelid: Secondary | ICD-10-CM | POA: Diagnosis not present

## 2016-05-23 ENCOUNTER — Other Ambulatory Visit: Payer: Self-pay

## 2016-05-25 ENCOUNTER — Telehealth (HOSPITAL_COMMUNITY): Payer: Self-pay | Admitting: *Deleted

## 2016-05-25 NOTE — Telephone Encounter (Signed)
Patient given detailed instructions per Myocardial Perfusion Study Information Sheet for the test on  05/30/16 Patient notified to arrive 15 minutes early and that it is imperative to arrive on time for appointment to keep from having the test rescheduled.  If you need to cancel or reschedule your appointment, please call the office within 24 hours of your appointment. Failure to do so may result in a cancellation of your appointment, and a $50 no show fee. Patient verbalized understanding.Hubbard Robinson, RN

## 2016-05-30 ENCOUNTER — Ambulatory Visit (HOSPITAL_COMMUNITY): Payer: Medicare Other | Attending: Cardiovascular Disease

## 2016-05-30 DIAGNOSIS — R9439 Abnormal result of other cardiovascular function study: Secondary | ICD-10-CM | POA: Insufficient documentation

## 2016-05-30 DIAGNOSIS — R0609 Other forms of dyspnea: Secondary | ICD-10-CM

## 2016-05-30 DIAGNOSIS — E119 Type 2 diabetes mellitus without complications: Secondary | ICD-10-CM | POA: Insufficient documentation

## 2016-05-30 DIAGNOSIS — R5383 Other fatigue: Secondary | ICD-10-CM | POA: Insufficient documentation

## 2016-05-30 DIAGNOSIS — I119 Hypertensive heart disease without heart failure: Secondary | ICD-10-CM | POA: Diagnosis not present

## 2016-05-30 DIAGNOSIS — R0789 Other chest pain: Secondary | ICD-10-CM | POA: Diagnosis not present

## 2016-05-30 DIAGNOSIS — R0602 Shortness of breath: Secondary | ICD-10-CM | POA: Diagnosis not present

## 2016-05-30 DIAGNOSIS — R42 Dizziness and giddiness: Secondary | ICD-10-CM | POA: Diagnosis not present

## 2016-05-30 DIAGNOSIS — R0600 Dyspnea, unspecified: Secondary | ICD-10-CM

## 2016-05-30 LAB — MYOCARDIAL PERFUSION IMAGING
Estimated workload: 8 METS
Exercise duration (min): 6 min
Exercise duration (sec): 45 s
LV dias vol: 143 mL (ref 62–150)
LV sys vol: 66 mL
MPHR: 155 {beats}/min
Peak HR: 146 {beats}/min
Percent HR: 94 %
RATE: 0.47
RPE: 18
Rest HR: 54 {beats}/min
SDS: 1
SRS: 4
SSS: 5
TID: 1.08

## 2016-05-30 MED ORDER — TECHNETIUM TC 99M TETROFOSMIN IV KIT
32.6000 | PACK | Freq: Once | INTRAVENOUS | Status: AC | PRN
  Administered 2016-05-30: 33 via INTRAVENOUS
  Filled 2016-05-30: qty 33

## 2016-05-30 MED ORDER — TECHNETIUM TC 99M TETROFOSMIN IV KIT
10.2000 | PACK | Freq: Once | INTRAVENOUS | Status: AC | PRN
  Administered 2016-05-30: 10 via INTRAVENOUS
  Filled 2016-05-30: qty 10

## 2016-06-03 ENCOUNTER — Telehealth: Payer: Self-pay | Admitting: Cardiovascular Disease

## 2016-06-03 NOTE — Telephone Encounter (Signed)
New message ° ° ° ° ° °Calling to get stress test results °

## 2016-06-03 NOTE — Telephone Encounter (Signed)
Returned call to patient.He was calling to get results of myoview done 05/30/16.Message sent to [facility] for results.

## 2016-06-03 NOTE — Telephone Encounter (Signed)
Returned call to patient no answer.LMTC. 

## 2016-06-03 NOTE — Telephone Encounter (Signed)
Intermediate risk MV with ischemia in several vascular territories. Please put on my office schedule to review with him week of 7/17  JJB

## 2016-06-06 NOTE — Telephone Encounter (Signed)
Patient has appt scheduled on 06/15/16.

## 2016-06-13 NOTE — Telephone Encounter (Signed)
Spoke to patient he is aware he has appointment with Dr.Berry 06/15/16 at 10:30 am to discuss myoview results.

## 2016-06-15 ENCOUNTER — Ambulatory Visit (INDEPENDENT_AMBULATORY_CARE_PROVIDER_SITE_OTHER): Payer: Medicare Other | Admitting: Cardiovascular Disease

## 2016-06-15 ENCOUNTER — Encounter: Payer: Self-pay | Admitting: Cardiovascular Disease

## 2016-06-15 VITALS — BP 116/70 | HR 52 | Ht 66.0 in | Wt 241.0 lb

## 2016-06-15 DIAGNOSIS — I2510 Atherosclerotic heart disease of native coronary artery without angina pectoris: Secondary | ICD-10-CM | POA: Diagnosis not present

## 2016-06-15 DIAGNOSIS — Z79899 Other long term (current) drug therapy: Secondary | ICD-10-CM

## 2016-06-15 NOTE — Progress Notes (Signed)
06/15/2016 Eduardo Butler   03-05-1951  [id number]  Primary Physician Orpah Melter, MD Primary Cardiologist: Lorretta Harp MD Eduardo Butler, [place]  HPI:  The patient is a very pleasant 65 year old moderately overweight married Caucasian male, father of 6, grandfather to 2 grandchildren who I last saw in the office 03/10/15. He has a history of CAD status post coronary artery bypass grafting x4 in March of 2003. 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 [name] follows. His other problems include hyperlipidemia and erectile dysfunction. I catheterized him at the [facility] August 17, 2006 revealing patent grafts with normal LV function. His last Myoview performed 01/04/13 was nonischemic Dr. Doyle Askew follows his lipid profile. When I saw him 2 months ago he was complaining of dyspnea on exertion. He saw one of our PAs a month later with increasing dyspnea on exertion. His diastolic was cut in half but remains bradycardic although he was clinically improved. His when necessary hydrochlorothiazide was changed to when necessary Lasix which she took over the weekend for several doses which resulted in improved dyspnea and decreased edema. I did discuss with him today so restriction. A 2-D echo was ordered that showed normal FUNCTION in a Myoview stress test that showed apical thinning with new ischemia in the inferior wall compared to a previous Myoview 2 years ago.  Current Outpatient Prescriptions  Medication Sig Dispense Refill  . ALPRAZolam (XANAX) 0.5 MG tablet Take 0.5 mg by mouth as needed for anxiety or sleep.    . Ascorbic Acid (VITAMIN C) 1000 MG tablet Take 1,000 mg by mouth daily.    [name] aspirin EC 81 MG tablet Take 81 mg by mouth daily.    . carvedilol (COREG) 3.125 MG tablet Take 1 tablet (3.125 mg total) by mouth 2 (two) times daily. 180 tablet 3  . ezetimibe (ZETIA) 10 MG tablet Take 1  tablet (10 mg total) by mouth daily. 123XX123 tablet 3  . folic acid (FOLVITE) 1 MG tablet Take 1 mg by mouth daily.     . furosemide (LASIX) 20 MG tablet Take 1 tablet (20 mg total) by mouth daily as needed. 30 tablet 5  . losartan (COZAAR) 50 MG tablet Take 1 tablet (50 mg total) by mouth daily. 30 tablet 11  . metFORMIN (GLUCOPHAGE) 1000 MG tablet Take 1,000 mg by mouth 2 (two) times daily with a meal.    . omeprazole (PRILOSEC) 40 MG capsule Take 40 mg by mouth daily as needed (heartburn).     . pioglitazone (ACTOS) 15 MG tablet Take 15 mg by mouth daily.    . simvastatin (ZOCOR) 40 MG tablet Take 1 tablet (40 mg total) by mouth daily at 6 PM. 30 tablet 11  . warfarin (COUMADIN) 5 MG tablet Take 5 mg by mouth as directed.    . [DISCONTINUED] dexlansoprazole (DEXILANT) 60 MG capsule Take 60 mg by mouth as needed.     . [DISCONTINUED] tadalafil (CIALIS) 5 MG tablet Take 5 mg by mouth as needed.      No current facility-administered medications for this visit.    No Known Allergies  Social History   Social History  . Marital Status: Married    Spouse Name: N/A  . Number of Children: N/A  . Years of Education: N/A   Occupational History  . Not on file.   Social History Main Topics  . Smoking status: Never Smoker   .  Smokeless tobacco: Never Used  . Alcohol Use: No     Comment: none since 1979  . Drug Use: No  . Sexual Activity: Not on file   Other Topics Concern  . Not on file   Social History Narrative     Review of Systems: General: negative for chills, fever, night sweats or weight changes.  Cardiovascular: negative for chest pain, dyspnea on exertion, edema, orthopnea, palpitations, paroxysmal nocturnal dyspnea or shortness of breath Dermatological: negative for rash Respiratory: negative for cough or wheezing Urologic: negative for hematuria Abdominal: negative for nausea, vomiting, diarrhea, bright red blood per rectum, melena, or hematemesis Neurologic: negative for  visual changes, syncope, or dizziness All other systems reviewed and are otherwise negative except as noted above.    Blood pressure 116/70, pulse 52, height 5\' 6"  (1.676 m), weight 241 lb (109.317 kg).  General appearance: alert and no distress Neck: no adenopathy, no carotid bruit, no JVD, supple, symmetrical, trachea midline and thyroid not enlarged, symmetric, no tenderness/mass/nodules Lungs: clear to auscultation bilaterally Heart: regular rate and rhythm, S1, S2 normal, no murmur, click, rub or gallop Extremities: 1+ pitting edema bilaterally  EKG not performed today  ASSESSMENT AND PLAN:   DYSPNEA Eduardo Butler returns today for follow-up of his dyspnea. I saw him on 04/08/16 and he was seen a month later by Almyra Deforest PA-C for increasing dyspnea. A 2-D echo was normal and a Myoview stress test showed apical thinning and inferior ischemia which is new compared to his prior Myoview 2 years ago. He really denies chest pain. His diastolic was cut in half and the symptoms markedly improved. His hydrochlorothiazide when necessary was changed to Lasix when necessary. He just took some Lasix over the weekend and his symptoms of swelling and shortness of breath improved as well. I suggested that he go on Lasix daily we will check some electrolytes in the next several weeks. At this point. Do not feel compelled to pursue cardiac catheterization remains an option should his symptoms worsen or recur.      Lorretta Harp MD FACP,FACC,FAHA, [facility] 06/15/2016 11:20 AM

## 2016-06-15 NOTE — Patient Instructions (Signed)
Medication Instructions:  Your physician has recommended you make the following change in your medication:  1- TAKE LASIX (FUROSEMIDE) 20MG  BY MOUTH DAILY.   Labwork: Your physician recommends that you return for lab work in [place] July 06, 2016. The lab can be found on the FIRST FLOOR of out building in [place]: Your physician recommends that you schedule a follow-up appointment in: [place].  If you need a refill on your cardiac medications before your next appointment, please call your pharmacy.

## 2016-06-15 NOTE — Assessment & Plan Note (Signed)
Eduardo Butler returns today for follow-up of his dyspnea. I saw him on 04/08/16 and he was seen a month later by Almyra Deforest PA-C for increasing dyspnea. A 2-D echo was normal and a Myoview stress test showed apical thinning and inferior ischemia which is new compared to his prior Myoview 2 years ago. He really denies chest pain. His diastolic was cut in half and the symptoms markedly improved. His hydrochlorothiazide when necessary was changed to Lasix when necessary. He just took some Lasix over the weekend and his symptoms of swelling and shortness of breath improved as well. I suggested that he go on Lasix daily we will check some electrolytes in the next several weeks. At this point. Do not feel compelled to pursue cardiac catheterization remains an option should his symptoms worsen or recur.

## 2016-06-22 ENCOUNTER — Telehealth: Payer: Self-pay | Admitting: Cardiovascular Disease

## 2016-06-22 NOTE — Telephone Encounter (Signed)
New Message  Pt daughter call requesting to speak with RN about pts Echo. Please call back to discuss

## 2016-06-22 NOTE — Telephone Encounter (Signed)
Called patient, daughter not on [place]. Notified him that he was called w/results on 6/8 and he had a follow up with Dr. Gwenlyn Found last week as well, his wife was at this appointment. He said there is no need to contact his daughter at this time but he will add her on to his DPR at his next visit.

## 2016-06-23 DIAGNOSIS — E782 Mixed hyperlipidemia: Secondary | ICD-10-CM | POA: Diagnosis not present

## 2016-06-23 DIAGNOSIS — Z86718 Personal history of other venous thrombosis and embolism: Secondary | ICD-10-CM | POA: Diagnosis not present

## 2016-06-23 DIAGNOSIS — I2510 Atherosclerotic heart disease of native coronary artery without angina pectoris: Secondary | ICD-10-CM | POA: Diagnosis not present

## 2016-06-23 DIAGNOSIS — E1149 Type 2 diabetes mellitus with other diabetic neurological complication: Secondary | ICD-10-CM | POA: Diagnosis not present

## 2016-06-23 DIAGNOSIS — I10 Essential (primary) hypertension: Secondary | ICD-10-CM | POA: Diagnosis not present

## 2016-06-23 DIAGNOSIS — R760 Raised antibody titer: Secondary | ICD-10-CM | POA: Diagnosis not present

## 2016-06-23 DIAGNOSIS — Z7901 Long term (current) use of anticoagulants: Secondary | ICD-10-CM | POA: Diagnosis not present

## 2016-06-23 DIAGNOSIS — F419 Anxiety disorder, unspecified: Secondary | ICD-10-CM | POA: Diagnosis not present

## 2016-07-06 ENCOUNTER — Other Ambulatory Visit: Payer: Self-pay

## 2016-07-07 DIAGNOSIS — Z79899 Other long term (current) drug therapy: Secondary | ICD-10-CM | POA: Diagnosis not present

## 2016-07-07 MED ORDER — OMEPRAZOLE 40 MG PO CPDR: 40.0000 mg | DELAYED_RELEASE_CAPSULE | Freq: Every day | ORAL | 1 refills | Status: DC | PRN

## 2016-07-07 NOTE — Telephone Encounter (Signed)
Rx request sent to pharmacy.  

## 2016-07-08 LAB — BASIC METABOLIC PANEL
BUN: 16 mg/dL (ref 7–25)
CO2: 26 mmol/L (ref 20–31)
Calcium: 8.8 mg/dL (ref 8.6–10.3)
Chloride: 103 mmol/L (ref 98–110)
Creat: 1.01 mg/dL (ref 0.70–1.25)
Glucose, Bld: 196 mg/dL — ABNORMAL HIGH (ref 65–99)
Potassium: 4.5 mmol/L (ref 3.5–5.3)
Sodium: 141 mmol/L (ref 135–146)

## 2016-07-08 LAB — MAGNESIUM: Magnesium: 1.7 mg/dL (ref 1.5–2.5)

## 2016-08-09 ENCOUNTER — Ambulatory Visit: Payer: Medicare Other | Admitting: Cardiovascular Disease

## 2016-08-18 DIAGNOSIS — Z7901 Long term (current) use of anticoagulants: Secondary | ICD-10-CM | POA: Diagnosis not present

## 2016-08-18 DIAGNOSIS — Z7984 Long term (current) use of oral hypoglycemic drugs: Secondary | ICD-10-CM | POA: Diagnosis not present

## 2016-08-18 DIAGNOSIS — Z23 Encounter for immunization: Secondary | ICD-10-CM | POA: Diagnosis not present

## 2016-08-18 DIAGNOSIS — I82409 Acute embolism and thrombosis of unspecified deep veins of unspecified lower extremity: Secondary | ICD-10-CM | POA: Diagnosis not present

## 2016-08-18 DIAGNOSIS — E782 Mixed hyperlipidemia: Secondary | ICD-10-CM | POA: Diagnosis not present

## 2016-08-18 DIAGNOSIS — E1149 Type 2 diabetes mellitus with other diabetic neurological complication: Secondary | ICD-10-CM | POA: Diagnosis not present

## 2016-09-14 ENCOUNTER — Encounter: Payer: Self-pay | Admitting: Cardiovascular Disease

## 2016-09-14 ENCOUNTER — Ambulatory Visit (INDEPENDENT_AMBULATORY_CARE_PROVIDER_SITE_OTHER): Payer: Medicare Other | Admitting: Cardiovascular Disease

## 2016-09-14 DIAGNOSIS — I10 Essential (primary) hypertension: Secondary | ICD-10-CM

## 2016-09-14 DIAGNOSIS — I2510 Atherosclerotic heart disease of native coronary artery without angina pectoris: Secondary | ICD-10-CM | POA: Diagnosis not present

## 2016-09-14 DIAGNOSIS — I2583 Coronary atherosclerosis due to lipid rich plaque: Secondary | ICD-10-CM

## 2016-09-14 NOTE — Assessment & Plan Note (Signed)
History of hypertension blood pressure measured at 120/66. He is on losartan and carvedilol. Continue current meds at current dosing

## 2016-09-14 NOTE — Assessment & Plan Note (Signed)
History of remote pulmonary embolism with additional history of lupus anticoagulant a life long Coumadin anticoagulation.

## 2016-09-14 NOTE — Assessment & Plan Note (Signed)
History of coronary artery disease status post coronary artery bypass grafting X 29 January 2002. I catheterized him 08/17/2006 revealing patent grafts with normal LV function. He did have a Myoview stress test performed 05/30/16 shows small inferoseptal basal defect with an EF of 45-54%. Since I saw him 5 months ago he denies chest pain or shortness of breath.

## 2016-09-14 NOTE — Patient Instructions (Signed)
Medication Instructions:  NO CHANGES.  Labwork: Labwork will be requested from your primary care physician.   Follow-Up: We request that you follow-up in: 6 MONTHS with an extender and in 12 MONTHS with Dr Andria Rhein will receive a reminder letter in the mail two months in advance. If you don't receive a letter, please call our office to schedule the follow-up appointment.    If you need a refill on your cardiac medications before your next appointment, please call your pharmacy.

## 2016-09-14 NOTE — Progress Notes (Signed)
09/14/2016 Eduardo Butler   June 10, 1951  [id number]  Primary Physician Orpah Melter, MD Primary Cardiologist: Lorretta Harp MD Lupe Carney, [place]  HPI:  The patient is a very pleasant 65 year old moderately overweight married Caucasian male, father of 2, grandfather to 2 grandchildren who I last saw in the office 06/15/16. He has a history of CAD status post coronary artery bypass grafting x4 in March of 2003. 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 [name] follows. His other problems include hyperlipidemia and erectile dysfunction. I catheterized him at the [facility] August 17, 2006 revealing patent grafts with normal LV function. His last Myoview performed 01/04/13 was nonischemic Dr. Doyle Askew follows his lipid profile. When I saw him 2 months ago he was complaining of dyspnea on exertion. He saw one of our PAs a month later with increasing dyspnea on exertion. His diastolic was cut in half but remains bradycardic although he was clinically improved. His when necessary hydrochlorothiazide was changed to when necessary Lasix which she took over the weekend for several doses which resulted in improved dyspnea and decreased edema. I did discuss with him today so restriction. A 2-D echo was ordered that showed normal FUNCTION in a Myoview stress test that showed apical thinning with new ischemia in the inferior wall compared to a previous Myoview 2 years ago. Since I saw him 5 months ago Germaine currently stable. He is watching his salt intake. He denies chest pain or shortness of breath.   Current Outpatient Prescriptions  Medication Sig Dispense Refill  . ALPRAZolam (XANAX) 0.5 MG tablet Take 0.5 mg by mouth as needed for anxiety or sleep.    . Ascorbic Acid (VITAMIN C) 1000 MG tablet Take 1,000 mg by mouth daily.    [name] aspirin EC 81 MG tablet Take 81 mg by mouth daily.    . carvedilol  (COREG) 3.125 MG tablet Take 1 tablet (3.125 mg total) by mouth 2 (two) times daily. 180 tablet 3  . ezetimibe (ZETIA) 10 MG tablet Take 1 tablet (10 mg total) by mouth daily. 123XX123 tablet 3  . folic acid (FOLVITE) 1 MG tablet Take 1 mg by mouth daily.     . furosemide (LASIX) 20 MG tablet Take 1 tablet (20 mg total) by mouth daily as needed. 30 tablet 5  . losartan (COZAAR) 50 MG tablet Take 1 tablet (50 mg total) by mouth daily. 30 tablet 11  . metFORMIN (GLUCOPHAGE) 1000 MG tablet Take 1,000 mg by mouth 2 (two) times daily with a meal.    . omeprazole (PRILOSEC) 40 MG capsule Take 1 capsule (40 mg total) by mouth daily as needed (heartburn). 90 capsule 1  . pioglitazone (ACTOS) 15 MG tablet Take 15 mg by mouth daily.    . simvastatin (ZOCOR) 40 MG tablet Take 1 tablet (40 mg total) by mouth daily at 6 PM. 30 tablet 11  . warfarin (COUMADIN) 5 MG tablet Take 5 mg by mouth as directed.     No current facility-administered medications for this visit.     Allergies  Allergen Reactions  . Morphine And Related     hallucinations    Social History   Social History  . Marital status: Married    Spouse name: N/A  . Number of children: N/A  . Years of education: N/A   Occupational History  . Not on file.   Social History Main Topics  .  Smoking status: Never Smoker  . Smokeless tobacco: Never Used  . Alcohol use No     Comment: none since 1979  . Drug use: No  . Sexual activity: Not on file   Other Topics Concern  . Not on file   Social History Narrative  . No narrative on file     Review of Systems: General: negative for chills, fever, night sweats or weight changes.  Cardiovascular: negative for chest pain, dyspnea on exertion, edema, orthopnea, palpitations, paroxysmal nocturnal dyspnea or shortness of breath Dermatological: negative for rash Respiratory: negative for cough or wheezing Urologic: negative for hematuria Abdominal: negative for nausea, vomiting, diarrhea,  bright red blood per rectum, melena, or hematemesis Neurologic: negative for visual changes, syncope, or dizziness All other systems reviewed and are otherwise negative except as noted above.    Blood pressure 120/66, pulse 84, height 5\' 6"  (1.676 m), weight 242 lb 12.8 oz (110.1 kg).  General appearance: alert and no distress Neck: no adenopathy, no carotid bruit, no JVD, supple, symmetrical, trachea midline and thyroid not enlarged, symmetric, no tenderness/mass/nodules Lungs: clear to auscultation bilaterally Heart: regular rate and rhythm, S1, S2 normal, no murmur, click, rub or gallop Extremities: extremities normal, atraumatic, no cyanosis or edema  EKG not performed today  ASSESSMENT AND PLAN:   HYPERLIPIDEMIA History of hyperlipidemia on statin therapy followed by his PCP  Essential hypertension History of hypertension blood pressure measured at 120/66. He is on losartan and carvedilol. Continue current meds at current dosing  Coronary artery disease History of coronary artery disease status post coronary artery bypass grafting X 29 January 2002. I catheterized him 08/17/2006 revealing patent grafts with normal LV function. He did have a Myoview stress test performed 05/30/16 shows small inferoseptal basal defect with an EF of 45-54%. Since I saw him 5 months ago he denies chest pain or shortness of breath.  PULMONARY EMBOLISM AND INFARCTION History of remote pulmonary embolism with additional history of lupus anticoagulant a life long Coumadin anticoagulation.      Lorretta Harp MD FACP,FACC,FAHA, [facility] 09/14/2016 3:30 PM

## 2016-09-14 NOTE — Assessment & Plan Note (Signed)
History of hyperlipidemia on statin therapy followed by his PCP 

## 2016-09-30 DIAGNOSIS — Z7901 Long term (current) use of anticoagulants: Secondary | ICD-10-CM | POA: Diagnosis not present

## 2016-10-03 DIAGNOSIS — M5411 Radiculopathy, occipito-atlanto-axial region: Secondary | ICD-10-CM | POA: Diagnosis not present

## 2016-10-03 DIAGNOSIS — M9901 Segmental and somatic dysfunction of cervical region: Secondary | ICD-10-CM | POA: Diagnosis not present

## 2016-10-04 DIAGNOSIS — M5411 Radiculopathy, occipito-atlanto-axial region: Secondary | ICD-10-CM | POA: Diagnosis not present

## 2016-10-04 DIAGNOSIS — M9901 Segmental and somatic dysfunction of cervical region: Secondary | ICD-10-CM | POA: Diagnosis not present

## 2016-10-06 DIAGNOSIS — M9901 Segmental and somatic dysfunction of cervical region: Secondary | ICD-10-CM | POA: Diagnosis not present

## 2016-10-06 DIAGNOSIS — M5411 Radiculopathy, occipito-atlanto-axial region: Secondary | ICD-10-CM | POA: Diagnosis not present

## 2016-10-11 DIAGNOSIS — M5411 Radiculopathy, occipito-atlanto-axial region: Secondary | ICD-10-CM | POA: Diagnosis not present

## 2016-10-11 DIAGNOSIS — M9901 Segmental and somatic dysfunction of cervical region: Secondary | ICD-10-CM | POA: Diagnosis not present

## 2016-10-13 DIAGNOSIS — Z86718 Personal history of other venous thrombosis and embolism: Secondary | ICD-10-CM | POA: Diagnosis not present

## 2016-10-13 DIAGNOSIS — Z7901 Long term (current) use of anticoagulants: Secondary | ICD-10-CM | POA: Diagnosis not present

## 2016-10-13 DIAGNOSIS — M5411 Radiculopathy, occipito-atlanto-axial region: Secondary | ICD-10-CM | POA: Diagnosis not present

## 2016-10-13 DIAGNOSIS — M9901 Segmental and somatic dysfunction of cervical region: Secondary | ICD-10-CM | POA: Diagnosis not present

## 2016-10-18 DIAGNOSIS — D125 Benign neoplasm of sigmoid colon: Secondary | ICD-10-CM | POA: Diagnosis not present

## 2016-10-18 DIAGNOSIS — Z1211 Encounter for screening for malignant neoplasm of colon: Secondary | ICD-10-CM | POA: Diagnosis not present

## 2016-10-18 DIAGNOSIS — K635 Polyp of colon: Secondary | ICD-10-CM | POA: Diagnosis not present

## 2016-10-24 DIAGNOSIS — Z86718 Personal history of other venous thrombosis and embolism: Secondary | ICD-10-CM | POA: Diagnosis not present

## 2016-10-24 DIAGNOSIS — Z7901 Long term (current) use of anticoagulants: Secondary | ICD-10-CM | POA: Diagnosis not present

## 2016-10-25 DIAGNOSIS — K635 Polyp of colon: Secondary | ICD-10-CM | POA: Diagnosis not present

## 2016-10-25 DIAGNOSIS — Z1211 Encounter for screening for malignant neoplasm of colon: Secondary | ICD-10-CM | POA: Diagnosis not present

## 2016-11-07 DIAGNOSIS — M9901 Segmental and somatic dysfunction of cervical region: Secondary | ICD-10-CM | POA: Diagnosis not present

## 2016-11-07 DIAGNOSIS — M5411 Radiculopathy, occipito-atlanto-axial region: Secondary | ICD-10-CM | POA: Diagnosis not present

## 2016-11-09 DIAGNOSIS — M9901 Segmental and somatic dysfunction of cervical region: Secondary | ICD-10-CM | POA: Diagnosis not present

## 2016-11-09 DIAGNOSIS — M5411 Radiculopathy, occipito-atlanto-axial region: Secondary | ICD-10-CM | POA: Diagnosis not present

## 2016-11-15 DIAGNOSIS — M5411 Radiculopathy, occipito-atlanto-axial region: Secondary | ICD-10-CM | POA: Diagnosis not present

## 2016-11-15 DIAGNOSIS — M9901 Segmental and somatic dysfunction of cervical region: Secondary | ICD-10-CM | POA: Diagnosis not present

## 2016-11-17 DIAGNOSIS — M9901 Segmental and somatic dysfunction of cervical region: Secondary | ICD-10-CM | POA: Diagnosis not present

## 2016-11-17 DIAGNOSIS — M5411 Radiculopathy, occipito-atlanto-axial region: Secondary | ICD-10-CM | POA: Diagnosis not present

## 2016-11-22 DIAGNOSIS — Z7901 Long term (current) use of anticoagulants: Secondary | ICD-10-CM | POA: Diagnosis not present

## 2016-11-22 DIAGNOSIS — Z86718 Personal history of other venous thrombosis and embolism: Secondary | ICD-10-CM | POA: Diagnosis not present

## 2017-01-03 DIAGNOSIS — Z7901 Long term (current) use of anticoagulants: Secondary | ICD-10-CM | POA: Diagnosis not present

## 2017-01-03 DIAGNOSIS — I2510 Atherosclerotic heart disease of native coronary artery without angina pectoris: Secondary | ICD-10-CM | POA: Diagnosis not present

## 2017-01-19 DIAGNOSIS — B9789 Other viral agents as the cause of diseases classified elsewhere: Secondary | ICD-10-CM | POA: Diagnosis not present

## 2017-01-19 DIAGNOSIS — Z20828 Contact with and (suspected) exposure to other viral communicable diseases: Secondary | ICD-10-CM | POA: Diagnosis not present

## 2017-01-19 DIAGNOSIS — Z20818 Contact with and (suspected) exposure to other bacterial communicable diseases: Secondary | ICD-10-CM | POA: Diagnosis not present

## 2017-01-19 DIAGNOSIS — J069 Acute upper respiratory infection, unspecified: Secondary | ICD-10-CM | POA: Diagnosis not present

## 2017-01-26 DIAGNOSIS — R760 Raised antibody titer: Secondary | ICD-10-CM | POA: Diagnosis not present

## 2017-01-26 DIAGNOSIS — Z7901 Long term (current) use of anticoagulants: Secondary | ICD-10-CM | POA: Diagnosis not present

## 2017-01-26 DIAGNOSIS — I2510 Atherosclerotic heart disease of native coronary artery without angina pectoris: Secondary | ICD-10-CM | POA: Diagnosis not present

## 2017-01-26 DIAGNOSIS — Z1159 Encounter for screening for other viral diseases: Secondary | ICD-10-CM | POA: Diagnosis not present

## 2017-01-26 DIAGNOSIS — F419 Anxiety disorder, unspecified: Secondary | ICD-10-CM | POA: Diagnosis not present

## 2017-01-26 DIAGNOSIS — Z23 Encounter for immunization: Secondary | ICD-10-CM | POA: Diagnosis not present

## 2017-01-26 DIAGNOSIS — Z79899 Other long term (current) drug therapy: Secondary | ICD-10-CM | POA: Diagnosis not present

## 2017-01-26 DIAGNOSIS — Z86718 Personal history of other venous thrombosis and embolism: Secondary | ICD-10-CM | POA: Diagnosis not present

## 2017-01-26 DIAGNOSIS — E782 Mixed hyperlipidemia: Secondary | ICD-10-CM | POA: Diagnosis not present

## 2017-01-26 DIAGNOSIS — I10 Essential (primary) hypertension: Secondary | ICD-10-CM | POA: Diagnosis not present

## 2017-01-26 DIAGNOSIS — Z0000 Encounter for general adult medical examination without abnormal findings: Secondary | ICD-10-CM | POA: Diagnosis not present

## 2017-01-26 DIAGNOSIS — E1149 Type 2 diabetes mellitus with other diabetic neurological complication: Secondary | ICD-10-CM | POA: Diagnosis not present

## 2017-02-09 DIAGNOSIS — I2510 Atherosclerotic heart disease of native coronary artery without angina pectoris: Secondary | ICD-10-CM | POA: Diagnosis not present

## 2017-02-09 DIAGNOSIS — I10 Essential (primary) hypertension: Secondary | ICD-10-CM | POA: Diagnosis not present

## 2017-02-09 DIAGNOSIS — B349 Viral infection, unspecified: Secondary | ICD-10-CM | POA: Diagnosis not present

## 2017-02-09 DIAGNOSIS — R6889 Other general symptoms and signs: Secondary | ICD-10-CM | POA: Diagnosis not present

## 2017-02-09 DIAGNOSIS — R5383 Other fatigue: Secondary | ICD-10-CM | POA: Diagnosis not present

## 2017-02-23 DIAGNOSIS — H25013 Cortical age-related cataract, bilateral: Secondary | ICD-10-CM | POA: Diagnosis not present

## 2017-02-23 DIAGNOSIS — E119 Type 2 diabetes mellitus without complications: Secondary | ICD-10-CM | POA: Diagnosis not present

## 2017-02-23 DIAGNOSIS — H2513 Age-related nuclear cataract, bilateral: Secondary | ICD-10-CM | POA: Diagnosis not present

## 2017-02-23 DIAGNOSIS — H35031 Hypertensive retinopathy, right eye: Secondary | ICD-10-CM | POA: Diagnosis not present

## 2017-02-27 DIAGNOSIS — Z86718 Personal history of other venous thrombosis and embolism: Secondary | ICD-10-CM | POA: Diagnosis not present

## 2017-02-27 DIAGNOSIS — Z7901 Long term (current) use of anticoagulants: Secondary | ICD-10-CM | POA: Diagnosis not present

## 2017-03-09 DIAGNOSIS — R05 Cough: Secondary | ICD-10-CM | POA: Diagnosis not present

## 2017-03-09 DIAGNOSIS — Z86718 Personal history of other venous thrombosis and embolism: Secondary | ICD-10-CM | POA: Diagnosis not present

## 2017-03-09 DIAGNOSIS — Z7901 Long term (current) use of anticoagulants: Secondary | ICD-10-CM | POA: Diagnosis not present

## 2017-03-14 ENCOUNTER — Ambulatory Visit: Payer: Medicare Other | Admitting: Cardiology

## 2017-03-21 DIAGNOSIS — Z86718 Personal history of other venous thrombosis and embolism: Secondary | ICD-10-CM | POA: Diagnosis not present

## 2017-03-21 DIAGNOSIS — Z7901 Long term (current) use of anticoagulants: Secondary | ICD-10-CM | POA: Diagnosis not present

## 2017-03-24 ENCOUNTER — Ambulatory Visit (INDEPENDENT_AMBULATORY_CARE_PROVIDER_SITE_OTHER): Payer: Medicare Other | Admitting: Cardiovascular Disease

## 2017-03-24 ENCOUNTER — Encounter: Payer: Self-pay | Admitting: Cardiovascular Disease

## 2017-03-24 VITALS — BP 104/66 | HR 79 | Ht 66.0 in | Wt 249.0 lb

## 2017-03-24 DIAGNOSIS — I2510 Atherosclerotic heart disease of native coronary artery without angina pectoris: Secondary | ICD-10-CM | POA: Diagnosis not present

## 2017-03-24 DIAGNOSIS — I10 Essential (primary) hypertension: Secondary | ICD-10-CM | POA: Diagnosis not present

## 2017-03-24 NOTE — Progress Notes (Signed)
03/24/2017 Eduardo Butler   [birth year]  [id number]  Primary Physician Orpah Melter, MD Primary Cardiologist: Lorretta Harp MD Lupe Carney, [place]  HPI:  The patient is a very pleasant 66 year old moderately overweight married Caucasian male, father of 2, grandfather to 2 grandchildren who I last saw in the office 09/14/16. He has a history of CAD status post coronary artery bypass grafting x4 in March of 2003. 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 [name] follows. His other problems include hyperlipidemia and erectile dysfunction. I catheterized him at the [facility] August 17, 2006 revealing patent grafts with normal LV function. His last Myoview performed 01/04/13 was nonischemic Dr. Doyle Askew follows his lipid profile. When I saw him 2 months ago he was complaining of dyspnea on exertion. He saw one of our PAs a month later with increasing dyspnea on exertion. His diastolic was cut in half but remains bradycardic although he was clinically improved. His when necessary hydrochlorothiazide was changed to when necessary Lasix which she took over the weekend for several doses which resulted in improved dyspnea and decreased edema. I did discuss with him today so restriction. A 2-D echo was ordered that showed normal FUNCTION in a Myoview stress test that showed apical thinning with new ischemia in the inferior wall compared to a previous Myoview 2 years ago. Since I saw him 6 months ago Eduardo Butler has remained stable denying chest pain or shortness of breath.   Current Outpatient Prescriptions  Medication Sig Dispense Refill  . ALPRAZolam (XANAX) 0.5 MG tablet Take 0.5 mg by mouth as needed for anxiety or sleep.    . Ascorbic Acid (VITAMIN C) 1000 MG tablet Take 1,000 mg by mouth daily.    [name] aspirin EC 81 MG tablet Take 81 mg by mouth daily.    . carvedilol (COREG) 3.125 MG tablet Take 1 tablet  (3.125 mg total) by mouth 2 (two) times daily. 180 tablet 3  . CINNAMON PO Take 2,000 mg by mouth daily.    [name] ezetimibe (ZETIA) 10 MG tablet Take 1 tablet (10 mg total) by mouth daily. 456 tablet 3  . folic acid (FOLVITE) 1 MG tablet Take 1 mg by mouth daily.     . furosemide (LASIX) 20 MG tablet Take 1 tablet (20 mg total) by mouth daily as needed. 30 tablet 5  . losartan (COZAAR) 50 MG tablet Take 1 tablet (50 mg total) by mouth daily. 30 tablet 11  . metFORMIN (GLUCOPHAGE) 1000 MG tablet Take 1,000 mg by mouth 2 (two) times daily with a meal.    . omeprazole (PRILOSEC) 40 MG capsule Take 1 capsule (40 mg total) by mouth daily as needed (heartburn). 90 capsule 1  . pioglitazone (ACTOS) 15 MG tablet Take 15 mg by mouth daily.    . simvastatin (ZOCOR) 40 MG tablet Take 1 tablet (40 mg total) by mouth daily at 6 PM. 30 tablet 11  . warfarin (COUMADIN) 5 MG tablet Take 5 mg by mouth as directed.     No current facility-administered medications for this visit.     Allergies  Allergen Reactions  . Morphine And Related     hallucinations    Social History   Social History  . Marital status: Married    Spouse name: N/A  . Number of children: N/A  . Years of education: N/A   Occupational History  . Not on file.  Social History Main Topics  . Smoking status: Never Smoker  . Smokeless tobacco: Never Used  . Alcohol use No     Comment: none since 1979  . Drug use: No  . Sexual activity: Not on file   Other Topics Concern  . Not on file   Social History Narrative  . No narrative on file     Review of Systems: General: negative for chills, fever, night sweats or weight changes.  Cardiovascular: negative for chest pain, dyspnea on exertion, edema, orthopnea, palpitations, paroxysmal nocturnal dyspnea or shortness of breath Dermatological: negative for rash Respiratory: negative for cough or wheezing Urologic: negative for hematuria Abdominal: negative for nausea, vomiting,  diarrhea, bright red blood per rectum, melena, or hematemesis Neurologic: negative for visual changes, syncope, or dizziness All other systems reviewed and are otherwise negative except as noted above.    Blood pressure 104/66, pulse 79, height 5\' 6"  (1.676 m), weight 249 lb (112.9 kg).  General appearance: alert and no distress Neck: no adenopathy, no carotid bruit, no JVD, supple, symmetrical, trachea midline and thyroid not enlarged, symmetric, no tenderness/mass/nodules Lungs: clear to auscultation bilaterally Heart: regular rate and rhythm, S1, S2 normal, no murmur, click, rub or gallop Extremities: extremities normal, atraumatic, no cyanosis or edema  EKG sinus rhythm at 79 without ST or T-wave changes. 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  Essential hypertension History of hypertension blood pressure measures 104/66. He is on carvedilol and losartan. Continued current meds at current dosing  Coronary artery disease History of coronary artery disease status post bypass grafting X 4 in 2003. I catheterized him at the [facility] September 22,007 revealing patent grafts and normal LV function. His last Myoview performed 05/30/16 was nonischemic. He denies chest pain or shortness of breath.      Lorretta Harp MD FACP,FACC,FAHA, [facility] 03/24/2017 3:36 PM

## 2017-03-24 NOTE — Assessment & Plan Note (Signed)
History of coronary artery disease status post bypass grafting X 4 in 2003. I catheterized him at the [facility] September 22,007 revealing patent grafts and normal LV function. His last Myoview performed 05/30/16 was nonischemic. He denies chest pain or shortness of breath.

## 2017-03-24 NOTE — Assessment & Plan Note (Signed)
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

## 2017-03-24 NOTE — Assessment & Plan Note (Signed)
History of hyperlipidemia on simvastatin followed by his PCP 

## 2017-03-24 NOTE — Assessment & Plan Note (Signed)
History of hypertension blood pressure measures 104/66. He is on carvedilol and losartan. Continued current meds at current dosing

## 2017-03-24 NOTE — Patient Instructions (Signed)

## 2017-03-27 DIAGNOSIS — Z86718 Personal history of other venous thrombosis and embolism: Secondary | ICD-10-CM | POA: Diagnosis not present

## 2017-03-27 DIAGNOSIS — Z7901 Long term (current) use of anticoagulants: Secondary | ICD-10-CM | POA: Diagnosis not present

## 2017-04-05 DIAGNOSIS — S61215A Laceration without foreign body of left ring finger without damage to nail, initial encounter: Secondary | ICD-10-CM | POA: Diagnosis not present

## 2017-04-05 DIAGNOSIS — S6992XA Unspecified injury of left wrist, hand and finger(s), initial encounter: Secondary | ICD-10-CM | POA: Diagnosis not present

## 2017-04-27 DIAGNOSIS — Z7901 Long term (current) use of anticoagulants: Secondary | ICD-10-CM | POA: Diagnosis not present

## 2017-04-27 DIAGNOSIS — M7021 Olecranon bursitis, right elbow: Secondary | ICD-10-CM | POA: Diagnosis not present

## 2017-05-08 DIAGNOSIS — Z86718 Personal history of other venous thrombosis and embolism: Secondary | ICD-10-CM | POA: Diagnosis not present

## 2017-05-08 DIAGNOSIS — Z7901 Long term (current) use of anticoagulants: Secondary | ICD-10-CM | POA: Diagnosis not present

## 2017-05-19 ENCOUNTER — Telehealth: Payer: Self-pay | Admitting: Cardiovascular Disease

## 2017-05-19 MED ORDER — CARVEDILOL 3.125 MG PO TABS: 3.1250 mg | ORAL_TABLET | Freq: Two times a day (BID) | ORAL | 3 refills | Status: DC

## 2017-05-19 NOTE — Telephone Encounter (Signed)
Pt needed refill for coreg to new pharmacy. Rx order sent, pt aware and voiced thanks.

## 2017-05-19 NOTE — Telephone Encounter (Signed)
New message      Pt c/o medication issue:  1. Name of Medication:  carvedilol  2. How are you currently taking this medication (dosage and times per day)? 3.125 mg 3. Are you having a reaction (difficulty breathing--STAT)? no 4. What is your medication issue?  Pt states he has questions regarding this medication.  Please call

## 2017-05-24 ENCOUNTER — Other Ambulatory Visit: Payer: Self-pay | Admitting: Cardiovascular Disease

## 2017-05-28 DIAGNOSIS — E785 Hyperlipidemia, unspecified: Secondary | ICD-10-CM | POA: Diagnosis not present

## 2017-05-28 DIAGNOSIS — S20212A Contusion of left front wall of thorax, initial encounter: Secondary | ICD-10-CM | POA: Diagnosis not present

## 2017-05-28 DIAGNOSIS — E119 Type 2 diabetes mellitus without complications: Secondary | ICD-10-CM | POA: Diagnosis not present

## 2017-05-28 DIAGNOSIS — W010XXA Fall on same level from slipping, tripping and stumbling without subsequent striking against object, initial encounter: Secondary | ICD-10-CM | POA: Diagnosis not present

## 2017-05-28 DIAGNOSIS — J984 Other disorders of lung: Secondary | ICD-10-CM | POA: Diagnosis not present

## 2017-05-28 DIAGNOSIS — R0789 Other chest pain: Secondary | ICD-10-CM | POA: Diagnosis not present

## 2017-05-28 DIAGNOSIS — Z951 Presence of aortocoronary bypass graft: Secondary | ICD-10-CM | POA: Diagnosis not present

## 2017-05-28 DIAGNOSIS — Z79899 Other long term (current) drug therapy: Secondary | ICD-10-CM | POA: Diagnosis not present

## 2017-05-28 DIAGNOSIS — R918 Other nonspecific abnormal finding of lung field: Secondary | ICD-10-CM | POA: Diagnosis not present

## 2017-05-29 DIAGNOSIS — Z7901 Long term (current) use of anticoagulants: Secondary | ICD-10-CM | POA: Diagnosis not present

## 2017-06-05 ENCOUNTER — Telehealth: Payer: Self-pay | Admitting: Cardiovascular Disease

## 2017-06-05 DIAGNOSIS — I10 Essential (primary) hypertension: Secondary | ICD-10-CM

## 2017-06-05 MED ORDER — FUROSEMIDE 20 MG PO TABS: 20.0000 mg | ORAL_TABLET | Freq: Every day | ORAL | 6 refills | Status: DC | PRN

## 2017-06-05 NOTE — Telephone Encounter (Signed)
Returned call to patient.He stated he is having swelling in both lower legs and feet for the past 1 week.Stated he has been eating salty foods.Advised he can increase Lasix to 40 mg daily for 3 days then return to normal dose 20 mg daily if needed.Advised to eat low salt diet. Advised to have bmet in 1 week.Advised to call back if he continues to have swelling.

## 2017-06-05 NOTE — Telephone Encounter (Signed)
Pt says he needs to be seen. Pt is having swelling in both legs,ankles and stomach. Pt said he took his fluid medicine,still have swelling.

## 2017-06-12 DIAGNOSIS — Z7901 Long term (current) use of anticoagulants: Secondary | ICD-10-CM | POA: Diagnosis not present

## 2017-06-12 DIAGNOSIS — Z86718 Personal history of other venous thrombosis and embolism: Secondary | ICD-10-CM | POA: Diagnosis not present

## 2017-06-28 DIAGNOSIS — H52213 Irregular astigmatism, bilateral: Secondary | ICD-10-CM | POA: Diagnosis not present

## 2017-06-28 DIAGNOSIS — H25813 Combined forms of age-related cataract, bilateral: Secondary | ICD-10-CM | POA: Diagnosis not present

## 2017-06-28 DIAGNOSIS — E119 Type 2 diabetes mellitus without complications: Secondary | ICD-10-CM | POA: Diagnosis not present

## 2017-06-28 DIAGNOSIS — H527 Unspecified disorder of refraction: Secondary | ICD-10-CM | POA: Diagnosis not present

## 2017-06-28 DIAGNOSIS — H1859 Other hereditary corneal dystrophies: Secondary | ICD-10-CM | POA: Diagnosis not present

## 2017-07-06 DIAGNOSIS — Z86718 Personal history of other venous thrombosis and embolism: Secondary | ICD-10-CM | POA: Diagnosis not present

## 2017-07-06 DIAGNOSIS — Z7901 Long term (current) use of anticoagulants: Secondary | ICD-10-CM | POA: Diagnosis not present

## 2017-07-19 DIAGNOSIS — I10 Essential (primary) hypertension: Secondary | ICD-10-CM | POA: Diagnosis not present

## 2017-07-19 DIAGNOSIS — E782 Mixed hyperlipidemia: Secondary | ICD-10-CM | POA: Diagnosis not present

## 2017-07-19 DIAGNOSIS — R760 Raised antibody titer: Secondary | ICD-10-CM | POA: Diagnosis not present

## 2017-07-19 DIAGNOSIS — Z6839 Body mass index (BMI) 39.0-39.9, adult: Secondary | ICD-10-CM | POA: Diagnosis not present

## 2017-07-19 DIAGNOSIS — E1149 Type 2 diabetes mellitus with other diabetic neurological complication: Secondary | ICD-10-CM | POA: Diagnosis not present

## 2017-07-19 DIAGNOSIS — D6861 Antiphospholipid syndrome: Secondary | ICD-10-CM | POA: Diagnosis not present

## 2017-07-19 DIAGNOSIS — Z7901 Long term (current) use of anticoagulants: Secondary | ICD-10-CM | POA: Diagnosis not present

## 2017-07-19 DIAGNOSIS — Z7984 Long term (current) use of oral hypoglycemic drugs: Secondary | ICD-10-CM | POA: Diagnosis not present

## 2017-07-19 DIAGNOSIS — I2510 Atherosclerotic heart disease of native coronary artery without angina pectoris: Secondary | ICD-10-CM | POA: Diagnosis not present

## 2017-07-19 DIAGNOSIS — F419 Anxiety disorder, unspecified: Secondary | ICD-10-CM | POA: Diagnosis not present

## 2017-07-19 DIAGNOSIS — Z86718 Personal history of other venous thrombosis and embolism: Secondary | ICD-10-CM | POA: Diagnosis not present

## 2017-07-24 DIAGNOSIS — Z7901 Long term (current) use of anticoagulants: Secondary | ICD-10-CM | POA: Diagnosis not present

## 2017-08-07 DIAGNOSIS — Z7901 Long term (current) use of anticoagulants: Secondary | ICD-10-CM | POA: Diagnosis not present

## 2017-08-25 DIAGNOSIS — Z7901 Long term (current) use of anticoagulants: Secondary | ICD-10-CM | POA: Diagnosis not present

## 2017-09-14 DIAGNOSIS — Z86718 Personal history of other venous thrombosis and embolism: Secondary | ICD-10-CM | POA: Diagnosis not present

## 2017-09-14 DIAGNOSIS — Z7901 Long term (current) use of anticoagulants: Secondary | ICD-10-CM | POA: Diagnosis not present

## 2017-10-03 DIAGNOSIS — Z7901 Long term (current) use of anticoagulants: Secondary | ICD-10-CM | POA: Diagnosis not present

## 2017-10-06 ENCOUNTER — Telehealth: Payer: Self-pay | Admitting: Cardiovascular Disease

## 2017-10-06 NOTE — Telephone Encounter (Signed)
New message   Patient reports a tiny, sharp pain in chest, and "irregular feeling" in head above ear on left side lasting for about 30 seconds.  Patient states he feels fine now. Please call    Pt c/o of Chest Pain: STAT if CP now or developed within 24 hours  1. Are you having CP right now? NO  2. Are you experiencing any other symptoms (ex. SOB, nausea, vomiting, sweating)? NO  3. How long have you been experiencing CP? 8:30 this morning  4. Is your CP continuous or coming and going? Only one episode  5. Have you taken Nitroglycerin? NO ?

## 2017-10-06 NOTE — Telephone Encounter (Signed)
Patient returned call and would like to see Dr. Gwenlyn Found only.  States he will call Monday to schedule appt.

## 2017-10-06 NOTE — Telephone Encounter (Signed)
Returned call to patient, patient states he was at the store drinking coffee, got up to go pay and turned to the right and developed a pain in the left side of his chest. decribed pain as a sharp pain, no tightness or pressure.  Reports he continued walking to the register to pay and developed a funny feeling in his head.  States this lasted approximately 30 secs but is concerned.  Patient denies SOB, N/V/D.  Denies pain at current.   Patient also reports he has been stressed over the last 2 months and has been experiencing chest heaviness on and off but thinks this is stress related.   Offered appt today with PA at 2:30, patient states he will call back once he checks with his wife.  Advised I would seek recommendations and will await a call back.

## 2017-10-18 DIAGNOSIS — Z6841 Body Mass Index (BMI) 40.0 and over, adult: Secondary | ICD-10-CM | POA: Diagnosis not present

## 2017-10-18 DIAGNOSIS — R030 Elevated blood-pressure reading, without diagnosis of hypertension: Secondary | ICD-10-CM | POA: Diagnosis not present

## 2017-10-18 DIAGNOSIS — M5416 Radiculopathy, lumbar region: Secondary | ICD-10-CM | POA: Diagnosis not present

## 2017-10-18 DIAGNOSIS — M5432 Sciatica, left side: Secondary | ICD-10-CM | POA: Diagnosis not present

## 2017-10-18 DIAGNOSIS — M9903 Segmental and somatic dysfunction of lumbar region: Secondary | ICD-10-CM | POA: Diagnosis not present

## 2017-10-24 DIAGNOSIS — M9903 Segmental and somatic dysfunction of lumbar region: Secondary | ICD-10-CM | POA: Diagnosis not present

## 2017-10-24 DIAGNOSIS — M5432 Sciatica, left side: Secondary | ICD-10-CM | POA: Diagnosis not present

## 2017-10-26 DIAGNOSIS — Z7901 Long term (current) use of anticoagulants: Secondary | ICD-10-CM | POA: Diagnosis not present

## 2017-10-31 DIAGNOSIS — M5432 Sciatica, left side: Secondary | ICD-10-CM | POA: Diagnosis not present

## 2017-10-31 DIAGNOSIS — M9903 Segmental and somatic dysfunction of lumbar region: Secondary | ICD-10-CM | POA: Diagnosis not present

## 2017-11-02 DIAGNOSIS — J069 Acute upper respiratory infection, unspecified: Secondary | ICD-10-CM | POA: Diagnosis not present

## 2017-11-09 DIAGNOSIS — Z7901 Long term (current) use of anticoagulants: Secondary | ICD-10-CM | POA: Diagnosis not present

## 2017-11-09 DIAGNOSIS — Z86718 Personal history of other venous thrombosis and embolism: Secondary | ICD-10-CM | POA: Diagnosis not present

## 2017-11-22 DIAGNOSIS — Z7901 Long term (current) use of anticoagulants: Secondary | ICD-10-CM | POA: Diagnosis not present

## 2017-11-22 DIAGNOSIS — Z86718 Personal history of other venous thrombosis and embolism: Secondary | ICD-10-CM | POA: Diagnosis not present

## 2017-11-30 ENCOUNTER — Other Ambulatory Visit: Payer: Self-pay | Admitting: Cardiovascular Disease

## 2017-11-30 DIAGNOSIS — Z7901 Long term (current) use of anticoagulants: Secondary | ICD-10-CM | POA: Diagnosis not present

## 2017-11-30 DIAGNOSIS — Z86718 Personal history of other venous thrombosis and embolism: Secondary | ICD-10-CM | POA: Diagnosis not present

## 2017-12-18 DIAGNOSIS — M5432 Sciatica, left side: Secondary | ICD-10-CM | POA: Diagnosis not present

## 2017-12-18 DIAGNOSIS — M9903 Segmental and somatic dysfunction of lumbar region: Secondary | ICD-10-CM | POA: Diagnosis not present

## 2017-12-20 DIAGNOSIS — Z86718 Personal history of other venous thrombosis and embolism: Secondary | ICD-10-CM | POA: Diagnosis not present

## 2017-12-20 DIAGNOSIS — Z7901 Long term (current) use of anticoagulants: Secondary | ICD-10-CM | POA: Diagnosis not present

## 2017-12-25 DIAGNOSIS — M5432 Sciatica, left side: Secondary | ICD-10-CM | POA: Diagnosis not present

## 2017-12-25 DIAGNOSIS — M9903 Segmental and somatic dysfunction of lumbar region: Secondary | ICD-10-CM | POA: Diagnosis not present

## 2018-01-24 DIAGNOSIS — Z86718 Personal history of other venous thrombosis and embolism: Secondary | ICD-10-CM | POA: Diagnosis not present

## 2018-01-24 DIAGNOSIS — Z7901 Long term (current) use of anticoagulants: Secondary | ICD-10-CM | POA: Diagnosis not present

## 2018-01-30 DIAGNOSIS — Z125 Encounter for screening for malignant neoplasm of prostate: Secondary | ICD-10-CM | POA: Diagnosis not present

## 2018-01-30 DIAGNOSIS — R0981 Nasal congestion: Secondary | ICD-10-CM | POA: Diagnosis not present

## 2018-01-30 DIAGNOSIS — Z0000 Encounter for general adult medical examination without abnormal findings: Secondary | ICD-10-CM | POA: Diagnosis not present

## 2018-01-30 DIAGNOSIS — Z23 Encounter for immunization: Secondary | ICD-10-CM | POA: Diagnosis not present

## 2018-01-30 DIAGNOSIS — R760 Raised antibody titer: Secondary | ICD-10-CM | POA: Diagnosis not present

## 2018-01-30 DIAGNOSIS — E1149 Type 2 diabetes mellitus with other diabetic neurological complication: Secondary | ICD-10-CM | POA: Diagnosis not present

## 2018-01-30 DIAGNOSIS — I10 Essential (primary) hypertension: Secondary | ICD-10-CM | POA: Diagnosis not present

## 2018-01-30 DIAGNOSIS — I2510 Atherosclerotic heart disease of native coronary artery without angina pectoris: Secondary | ICD-10-CM | POA: Diagnosis not present

## 2018-01-30 DIAGNOSIS — E782 Mixed hyperlipidemia: Secondary | ICD-10-CM | POA: Diagnosis not present

## 2018-01-30 DIAGNOSIS — Z79899 Other long term (current) drug therapy: Secondary | ICD-10-CM | POA: Diagnosis not present

## 2018-01-30 DIAGNOSIS — Z86718 Personal history of other venous thrombosis and embolism: Secondary | ICD-10-CM | POA: Diagnosis not present

## 2018-01-30 DIAGNOSIS — Z7901 Long term (current) use of anticoagulants: Secondary | ICD-10-CM | POA: Diagnosis not present

## 2018-01-30 DIAGNOSIS — Z6841 Body Mass Index (BMI) 40.0 and over, adult: Secondary | ICD-10-CM | POA: Diagnosis not present

## 2018-02-07 DIAGNOSIS — I82409 Acute embolism and thrombosis of unspecified deep veins of unspecified lower extremity: Secondary | ICD-10-CM | POA: Diagnosis not present

## 2018-02-07 DIAGNOSIS — Z7901 Long term (current) use of anticoagulants: Secondary | ICD-10-CM | POA: Diagnosis not present

## 2018-02-21 ENCOUNTER — Telehealth: Payer: Self-pay | Admitting: Cardiovascular Disease

## 2018-02-21 NOTE — Telephone Encounter (Signed)
Returned the call to the patient. He stated that since November he has been having off and on lower extremity edema. He has been taking his as needed furosemide 20 mg daily. Yesterday he felt like his edema was worse so he took 40 mg. He has occasional shortness of breath and denies chest pain. He does not check his weight daily. He stated that his clothes are tighter than normal.   He stated that he has been able to work out (20 minutes of riding a bike at [employer]) and mowed his lawn yesterday. He does have shortness of breath occasionally at night when lying down and sometimes has to elevate himself to get rest. The patient is concerned that he may be having CHF symptoms.  His edema gets better overnight and then progresses throughout the day. He has been instructed to elevate his legs throughout the day. An appointment has been made with Dr. Gwenlyn Found on 4/5. Message will be routed for his knowledge.

## 2018-02-21 NOTE — Telephone Encounter (Signed)
Pt c/o swelling: STAT is pt has developed SOB within 24 hours  1) How much weight have you gained and in what time span? 20 pounds    2) If swelling, where is the swelling located?  All over his body and his left leg ankle all the way up his leg  3) Are you currently taking a fluid pill? yes  4) Are you currently SOB? Yes only when he do stuff mow the lawn   5) Do you have a log of your daily weights (if so, list)?  no  6) Have you gained 3 pounds in a day or 5 pounds in a week? Unknown     7) Have you traveled recently? no

## 2018-02-22 DIAGNOSIS — Z7901 Long term (current) use of anticoagulants: Secondary | ICD-10-CM | POA: Diagnosis not present

## 2018-02-22 DIAGNOSIS — I82409 Acute embolism and thrombosis of unspecified deep veins of unspecified lower extremity: Secondary | ICD-10-CM | POA: Diagnosis not present

## 2018-03-01 NOTE — Telephone Encounter (Signed)
The patient is probably due for his annual follow-up visit

## 2018-03-02 ENCOUNTER — Encounter: Payer: Self-pay | Admitting: Cardiovascular Disease

## 2018-03-02 ENCOUNTER — Ambulatory Visit (INDEPENDENT_AMBULATORY_CARE_PROVIDER_SITE_OTHER): Payer: Medicare Other | Admitting: Cardiovascular Disease

## 2018-03-02 VITALS — BP 118/74 | HR 72 | Ht 66.0 in | Wt 257.2 lb

## 2018-03-02 DIAGNOSIS — I10 Essential (primary) hypertension: Secondary | ICD-10-CM

## 2018-03-02 DIAGNOSIS — R600 Localized edema: Secondary | ICD-10-CM

## 2018-03-02 DIAGNOSIS — E7800 Pure hypercholesterolemia, unspecified: Secondary | ICD-10-CM

## 2018-03-02 DIAGNOSIS — R0602 Shortness of breath: Secondary | ICD-10-CM | POA: Diagnosis not present

## 2018-03-02 DIAGNOSIS — I2510 Atherosclerotic heart disease of native coronary artery without angina pectoris: Secondary | ICD-10-CM

## 2018-03-02 MED ORDER — MEDICAL COMPRESSION STOCKINGS MISC: 0 refills | Status: AC

## 2018-03-02 MED ORDER — FUROSEMIDE 40 MG PO TABS: 40.0000 mg | ORAL_TABLET | Freq: Every day | ORAL | 6 refills | Status: DC | PRN

## 2018-03-02 NOTE — Assessment & Plan Note (Signed)
History of CAD status post coronary artery bypass grafting 4 in March 2003. I did perform cardiac catheterization on him 08/17/06 revealing patent grafts with normal LV function. Myoview performed 01/04/13 was nonischemic as was the most recent one performed 05/30/16. He denies chest pain.

## 2018-03-02 NOTE — Telephone Encounter (Signed)
Pt had ov today with Dr. Gwenlyn Found.

## 2018-03-02 NOTE — Progress Notes (Signed)
03/02/2018 Eduardo Butler   1951/01/05  [id number]  Primary Physician Orpah Melter, MD Primary Cardiologist: Lorretta Harp MD FACP, [place], [place], [place]  HPI:  Eduardo Butler is a 67 y.o.  moderately overweight married Caucasian male, father of 2, grandfather to 2 grandchildren who I last saw in the office 03/24/17. He has a history of CAD status post coronary artery bypass grafting x4 in March of 2003. 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 [name] follows. His other problems include hyperlipidemia and erectile dysfunction. I catheterized him at the [facility] August 17, 2006 revealing patent grafts with normal LV function. His last Myoview performed 01/04/13 was nonischemic Dr. Doyle Askew follows his lipid profile. When I saw him 2 months ago he was complaining of dyspnea on exertion. He saw one of our PAs a month later with increasing dyspnea on exertion. His diastolic was cut in half but remains bradycardic although he was clinically improved. His when necessary hydrochlorothiazide was changed to when necessary Lasix which she took over the weekend for several doses which resulted in improved dyspnea and decreased edema. I did discuss with him today so restriction. A 2-D echo was ordered that showed normal FUNCTION in a Myoview stress test that showed apical thinning with new ischemia in the inferior wall compared to a previous Myoview 2 years ago. Since I saw him a year ago Eduardo Butler has noticed increasing dyspnea on exertion and lower extremity edema but denies chest pain.    No outpatient medications have been marked as taking for the 03/02/18 encounter (Office Visit) with Lorretta Harp, MD.     Allergies  Allergen Reactions  . Morphine And Related     hallucinations    Social History   Socioeconomic History  . Marital status: Married    Spouse name: Not on file  . Number of children: Not  on file  . Years of education: Not on file  . Highest education level: Not on file  Occupational History  . Not on file  Social Needs  . Financial resource strain: Not on file  . Food insecurity:    Worry: Not on file    Inability: Not on file  . Transportation needs:    Medical: Not on file    Non-medical: Not on file  Tobacco Use  . Smoking status: Never Smoker  . Smokeless tobacco: Never Used  Substance and Sexual Activity  . Alcohol use: No    Comment: none since 1979  . Drug use: No  . Sexual activity: Not on file  Lifestyle  . Physical activity:    Days per week: Not on file    Minutes per session: Not on file  . Stress: Not on file  Relationships  . Social connections:    Talks on phone: Not on file    Gets together: Not on file    Attends religious service: Not on file    Active member of club or organization: Not on file    Attends meetings of clubs or organizations: Not on file    Relationship status: Not on file  . Intimate partner violence:    Fear of current or ex partner: Not on file    Emotionally abused: Not on file    Physically abused: Not on file    Forced sexual activity: Not on file  Other Topics Concern  . Not on file  Social  History Narrative  . Not on file     Review of Systems: General: negative for chills, fever, night sweats or weight changes.  Cardiovascular: negative for chest pain, dyspnea on exertion, edema, orthopnea, palpitations, paroxysmal nocturnal dyspnea or shortness of breath Dermatological: negative for rash Respiratory: negative for cough or wheezing Urologic: negative for hematuria Abdominal: negative for nausea, vomiting, diarrhea, bright red blood per rectum, melena, or hematemesis Neurologic: negative for visual changes, syncope, or dizziness All other systems reviewed and are otherwise negative except as noted above.    Blood pressure 118/74, pulse 72, height 5\' 6"  (1.676 m), weight 257 lb 3.2 oz (116.7 kg).    General appearance: alert and no distress Neck: no adenopathy, no carotid bruit, no JVD, supple, symmetrical, trachea midline and thyroid not enlarged, symmetric, no tenderness/mass/nodules Lungs: clear to auscultation bilaterally Heart: regular rate and rhythm, S1, S2 normal, no murmur, click, rub or gallop Extremities: 1-2+ lower extremity edema left greater than right Pulses: 2+ and symmetric Skin: Skin color, texture, turgor normal. No rashes or lesions Neurologic: Alert and oriented X 3, normal strength and tone. Normal symmetric reflexes. Normal coordination and gait  EKG sinus rhythm at 72 without ST or T-wave changes. He did have low limb voltage. I personally reviewed this EKG.  ASSESSMENT AND PLAN:   Hyperlipidemia History of hyperlipidemia on statin therapy and Zetia with recent lipid profile performed by his PCP 01/30/18. Total cholesterol 128, LDL 62 and HDL of 41.  PULMONARY EMBOLISM AND INFARCTION History of pulmonary embolism in the remote past on Coumadin anticoagulation followed by his PCP. He does have a history of lupus anticoagulant.  DYSPNEA History of worsening dyspnea on exertion with lower extremity edema. He is on low-dose furosemide which had been increased from 20-40 mg a day. He watches his salt intake. I'm also going to prescribe 20-30 mm knee-high compression stockings. We will obtain a 2-D echocardiogram.  Essential hypertension History of essential hypertension blood pressures measured 118/74. He is on carvedilol and losartan. Continue current meds at current dosing.  Coronary artery disease History of CAD status post coronary artery bypass grafting 4 in March 2003. I did perform cardiac catheterization on him 08/17/06 revealing patent grafts with normal LV function. Myoview performed 01/04/13 was nonischemic as was the most recent one performed 05/30/16. He denies chest pain.      Lorretta Harp MD FACP,FACC,FAHA, [facility] 03/02/2018 9:23 AM

## 2018-03-02 NOTE — Assessment & Plan Note (Signed)
History of pulmonary embolism in the remote past on Coumadin anticoagulation followed by his PCP. He does have a history of lupus anticoagulant.

## 2018-03-02 NOTE — Patient Instructions (Signed)
Medication Instructions: Your physician recommends that you continue on your current medications as directed. Please refer to the Current Medication list given to you today.  Increase Furosemide to 40 mg daily.   Labwork: Your physician recommends that you return for lab work in: 1 week (03/09/18)--BMET.  Testing/Procedures: Your physician has requested that you have an echocardiogram. Echocardiography is a painless test that uses sound waves to create images of your heart. It provides your doctor with information about the size and shape of your heart and how well your heart's chambers and valves are working. This procedure takes approximately one hour. There are no restrictions for this procedure.  Follow-Up: We request that you follow-up in: 6 weeks with an extender and in 3 months with Dr Andria Rhein will receive a reminder letter in the mail two months in advance. If you don't receive a letter, please call our office to schedule the follow-up appointment.  If you need a refill on your cardiac medications before your next appointment, please call your pharmacy.    Rx for 20-30 mm Knee High Compression Stockings given.

## 2018-03-02 NOTE — Assessment & Plan Note (Signed)
History of essential hypertension blood pressures measured 118/74. He is on carvedilol and losartan. Continue current meds at current dosing.

## 2018-03-02 NOTE — Assessment & Plan Note (Addendum)
History of hyperlipidemia on statin therapy and Zetia with recent lipid profile performed by his PCP 01/30/18. Total cholesterol 128, LDL 62 and HDL of 41.

## 2018-03-02 NOTE — Assessment & Plan Note (Signed)
History of worsening dyspnea on exertion with lower extremity edema. He is on low-dose furosemide which had been increased from 20-40 mg a day. He watches his salt intake. I'm also going to prescribe 20-30 mm knee-high compression stockings. We will obtain a 2-D echocardiogram.

## 2018-03-06 ENCOUNTER — Telehealth: Payer: Self-pay | Admitting: Cardiovascular Disease

## 2018-03-06 NOTE — Telephone Encounter (Signed)
Returned call to patient, patient reports he had lab work in March at PCP and was wondering if he needed another lab in 1 week (BMET) as advised by Dr. Gwenlyn Found at [facility] 4/5.    Advised to get repeat lab as this is due to medication increase.  Patient states he will get labs at PCP and have them faxed to Dr. Gwenlyn Found.

## 2018-03-06 NOTE — Telephone Encounter (Signed)
°  New message  Pt verbalized that he is calling for RN  To see if Dr.Berry has reviewed the lab results from his PCP

## 2018-03-07 ENCOUNTER — Ambulatory Visit (HOSPITAL_COMMUNITY): Payer: Medicare Other | Attending: Internal Medicine

## 2018-03-07 ENCOUNTER — Other Ambulatory Visit: Payer: Self-pay

## 2018-03-07 ENCOUNTER — Ambulatory Visit: Payer: Medicare Other | Admitting: Cardiovascular Disease

## 2018-03-07 DIAGNOSIS — R0602 Shortness of breath: Secondary | ICD-10-CM

## 2018-03-07 DIAGNOSIS — R600 Localized edema: Secondary | ICD-10-CM | POA: Diagnosis not present

## 2018-03-07 DIAGNOSIS — R0600 Dyspnea, unspecified: Secondary | ICD-10-CM | POA: Insufficient documentation

## 2018-03-07 DIAGNOSIS — I10 Essential (primary) hypertension: Secondary | ICD-10-CM | POA: Insufficient documentation

## 2018-03-07 DIAGNOSIS — Z6841 Body Mass Index (BMI) 40.0 and over, adult: Secondary | ICD-10-CM | POA: Insufficient documentation

## 2018-03-07 DIAGNOSIS — E669 Obesity, unspecified: Secondary | ICD-10-CM | POA: Diagnosis not present

## 2018-03-07 DIAGNOSIS — E785 Hyperlipidemia, unspecified: Secondary | ICD-10-CM | POA: Diagnosis not present

## 2018-03-07 DIAGNOSIS — I2510 Atherosclerotic heart disease of native coronary artery without angina pectoris: Secondary | ICD-10-CM | POA: Diagnosis not present

## 2018-03-07 DIAGNOSIS — Z951 Presence of aortocoronary bypass graft: Secondary | ICD-10-CM | POA: Diagnosis not present

## 2018-03-09 DIAGNOSIS — R600 Localized edema: Secondary | ICD-10-CM | POA: Diagnosis not present

## 2018-03-09 DIAGNOSIS — R0602 Shortness of breath: Secondary | ICD-10-CM | POA: Diagnosis not present

## 2018-03-10 LAB — BASIC METABOLIC PANEL
BUN/Creatinine Ratio: 23 (ref 10–24)
BUN: 18 mg/dL (ref 8–27)
CO2: 24 mmol/L (ref 20–29)
Calcium: 8.7 mg/dL (ref 8.6–10.2)
Chloride: 103 mmol/L (ref 96–106)
Creatinine, Ser: 0.8 mg/dL (ref 0.76–1.27)
GFR calc Af Amer: 107 mL/min/{1.73_m2} (ref >59)
GFR calc non Af Amer: 92 mL/min/{1.73_m2} (ref >59)
Glucose: 133 mg/dL — ABNORMAL HIGH (ref 65–99)
Potassium: 4.2 mmol/L (ref 3.5–5.2)
Sodium: 144 mmol/L (ref 134–144)

## 2018-03-19 ENCOUNTER — Telehealth: Payer: Self-pay | Admitting: Cardiovascular Disease

## 2018-03-19 NOTE — Telephone Encounter (Signed)
New Message   Pt's daughter calling to discuss echo results with nurse. Please call

## 2018-03-21 DIAGNOSIS — Z86718 Personal history of other venous thrombosis and embolism: Secondary | ICD-10-CM | POA: Diagnosis not present

## 2018-03-21 DIAGNOSIS — Z7901 Long term (current) use of anticoagulants: Secondary | ICD-10-CM | POA: Diagnosis not present

## 2018-03-21 NOTE — Telephone Encounter (Signed)
Spoke to pt daughter, Nira Conn. Unable to give results to her because she is not listed on pt DPR. Faxed DPR to [place] at [phone] to update in chart. Nira Conn stated pt did not understand his echo results so she wanted to get results to be able to explain it to him. Stated she will fax updated DPR back to [place] when completed.

## 2018-03-21 NOTE — Telephone Encounter (Signed)
Follow Up:    Daughter calling for Echo results please.

## 2018-03-23 ENCOUNTER — Telehealth: Payer: Self-pay | Admitting: Cardiovascular Disease

## 2018-03-23 NOTE — Telephone Encounter (Signed)
Informed Pt's daughter that DPR has not been update as of yet. Verified Fax number. Daughter states she will resend fax. Pt informed once we receive it will update the system.

## 2018-03-23 NOTE — Telephone Encounter (Signed)
New message:     Pt's daughter is calling to see if we received the new [employer] form sent over.

## 2018-03-26 DIAGNOSIS — Z7901 Long term (current) use of anticoagulants: Secondary | ICD-10-CM | POA: Diagnosis not present

## 2018-03-26 DIAGNOSIS — M79606 Pain in leg, unspecified: Secondary | ICD-10-CM | POA: Diagnosis not present

## 2018-03-26 DIAGNOSIS — D6861 Antiphospholipid syndrome: Secondary | ICD-10-CM | POA: Diagnosis not present

## 2018-03-26 DIAGNOSIS — Z86718 Personal history of other venous thrombosis and embolism: Secondary | ICD-10-CM | POA: Diagnosis not present

## 2018-03-27 DIAGNOSIS — Z77122 Contact with and (suspected) exposure to noise: Secondary | ICD-10-CM | POA: Diagnosis not present

## 2018-03-27 DIAGNOSIS — H903 Sensorineural hearing loss, bilateral: Secondary | ICD-10-CM | POA: Diagnosis not present

## 2018-03-28 ENCOUNTER — Other Ambulatory Visit: Payer: Self-pay | Admitting: Family Medicine

## 2018-03-28 DIAGNOSIS — M79605 Pain in left leg: Secondary | ICD-10-CM

## 2018-03-28 NOTE — Telephone Encounter (Signed)
Follow up    Patients daughter Nira Conn is calling in reference to echo test results. Please call to discuss. DPR has been updated.

## 2018-03-29 NOTE — Telephone Encounter (Signed)
Spoke to [employer]. Gave echo results. She wants to know what plan for pt will be. States he is having SOB on minimal exertion such as taking a shower. She stated his resting HR has gone from 60s to 80s and he feels like he is running when he is not doing anything.  Pt has scheduled appt with Almyra Deforest, PA on 5/23.  Told Heather I will talk to Dr. Gwenlyn Found about this and call her back this afternoon. She verbalized thanks.

## 2018-03-30 ENCOUNTER — Ambulatory Visit
Admission: RE | Admit: 2018-03-30 | Discharge: 2018-03-30 | Disposition: A | Discharge status: Home / Self Care | Payer: Medicare Other | Source: Ambulatory Visit | Attending: Family Medicine | Admitting: Family Medicine

## 2018-03-30 DIAGNOSIS — M7989 Other specified soft tissue disorders: Secondary | ICD-10-CM | POA: Diagnosis not present

## 2018-03-30 DIAGNOSIS — M79662 Pain in left lower leg: Secondary | ICD-10-CM | POA: Diagnosis not present

## 2018-03-30 DIAGNOSIS — M79605 Pain in left leg: Secondary | ICD-10-CM

## 2018-04-02 NOTE — Telephone Encounter (Signed)
Essentially normal 2D echocardiogram.

## 2018-04-19 ENCOUNTER — Ambulatory Visit (INDEPENDENT_AMBULATORY_CARE_PROVIDER_SITE_OTHER): Payer: Medicare Other | Admitting: Physician Assistant

## 2018-04-19 ENCOUNTER — Encounter: Payer: Self-pay | Admitting: Physician Assistant

## 2018-04-19 VITALS — BP 134/82 | HR 67 | Ht 66.0 in | Wt 261.4 lb

## 2018-04-19 DIAGNOSIS — R600 Localized edema: Secondary | ICD-10-CM

## 2018-04-19 DIAGNOSIS — I2782 Chronic pulmonary embolism: Secondary | ICD-10-CM | POA: Diagnosis not present

## 2018-04-19 DIAGNOSIS — E785 Hyperlipidemia, unspecified: Secondary | ICD-10-CM | POA: Diagnosis not present

## 2018-04-19 DIAGNOSIS — I25810 Atherosclerosis of coronary artery bypass graft(s) without angina pectoris: Secondary | ICD-10-CM | POA: Diagnosis not present

## 2018-04-19 DIAGNOSIS — R0609 Other forms of dyspnea: Secondary | ICD-10-CM

## 2018-04-19 DIAGNOSIS — Z79899 Other long term (current) drug therapy: Secondary | ICD-10-CM

## 2018-04-19 DIAGNOSIS — R0600 Dyspnea, unspecified: Secondary | ICD-10-CM

## 2018-04-19 NOTE — Progress Notes (Signed)
Cardiology Office Note    Date:  04/19/2018   ID:  Quang, Thorpe 28-May-1951, MRN [id number]  PCP:  Orpah Melter, MD  Cardiologist:  Dr. Gwenlyn Found  Chief Complaint  Patient presents with  . Follow-up    SOB, fatigue, denies chest pains, swelling in left leg noted    History of Present Illness:  Eduardo Butler is a 67 y.o. male with PMH of CAD s/p CABG x 4 (sequential SVG to acute marginal and distal RCA, free radial to LCx/OM, LIMA to LAD) 01/2002, recurrent pulmonary emboli secondary to lupus anticoagulant on lifelong Coumadin, hyperlipidemia and erectile dysfunction.  He had a cardiac catheterization on 08/17/2006 revealing patent grafts with normal LV function.  Myoview in February 2014 was nonischemic.  He was evaluated for worsening dyspnea in July 2017 with myoview, this revealed EF 54%, small defect of moderate severity present in the basal inferoseptal location, small defect of severe severity present in the apex location consistent with very mild ischemia, overall intermediate risk study.  Echocardiogram obtained on 03/07/2018 showed EF 60 to 65%, moderate LVH, mild TR, PA peak pressure 32 mmHg.  Patient presents today for cardiology office visit.  He continues to have 2+ pitting edema bilaterally.  He says he was on 40 mg daily of Lasix which seems to help with his symptom, however he recently went to [place] and had to hold his Lasix during the trip.  Although under his medication list, Lasix is listed as 40 mg as needed, he has been taking Lasix on a daily basis essentially since the last office visit.  It seems to control his lower extremity edema quite well.  He plan to restart the Lasix after holding it for 3 days during his trip.  He will need a basic metabolic panel to make sure his renal function and electrolyte is okay.  Otherwise he denies any recent chest pain, he continued to have chronic dyspnea on exertion which has been unchanged.   Past Medical History:  Diagnosis Date  .  Anxiety   . Arthritis   . CHF (congestive heart failure) ([place])   . Coronary artery disease   . Diabetes ([place])   . H/O cardiac catheterization 08/25/2006   normal cath-patent grafts  . H/O Doppler ultrasound 02/21/2011   lower ext.venous doppler,, no treatment except support stockings are recommended if clinically indicated  . H/O echocardiogram 12/02/2010   EF 55% , no significant abnormalities  . History of stress test 01/04/2013   Lexiscan Myoview ; scattered PVC's ; LV EF 54% ; Normal stress test   . Hyperlipidemia   . Hypertension   . Obesity   . Peroneal DVT (deep venous thrombosis) ([place])   . Shortness of breath     Past Surgical History:  Procedure Laterality Date  . BYPASS GRAFT  2003   4 bypass   . CARDIAC CATHETERIZATION  02-06-02   S/P sudden cardiac death; three vessel coronary artery disease; preserved overall left ventricular systolic function with loculated mild to moderate anterolateral hypokinesis; no mitral regurgitation noted  . CORONARY ARTERY BYPASS GRAFT  2003  . HERNIA REPAIR    . LUMBAR LAMINECTOMY/ DECOMPRESSION WITH MET-RX Left 07/08/2014   Procedure: Left Lumbar Four-Five Metrex foraminal microdiskectomy;  Surgeon: Consuella Lose, MD;  Location: [facility];  Service: Neurosurgery;  Laterality: Left;  Left Lumbar Four-Five Metrex foraminal microdiskectomy  . PELVIC LAPAROSCOPY  2005  . SHOULDER SURGERY    . UMBILICAL HERNIA REPAIR  11/17/2011  Procedure: HERNIA REPAIR UMBILICAL ADULT;  Surgeon: Harl Bowie, MD;  Location: [facility];  Service: General;  Laterality: N/A;  umbilical hernia repair with mesh    Current Medications: Outpatient Medications Prior to Visit  Medication Sig Dispense Refill  . ALPRAZolam (XANAX) 0.5 MG tablet Take 0.5 mg by mouth as needed for anxiety or sleep.    . Ascorbic Acid (VITAMIN C) 1000 MG tablet Take 1,000 mg by mouth daily.    [name] aspirin EC 81 MG tablet Take 81 mg by mouth daily.    .  carvedilol (COREG) 3.125 MG tablet Take 1 tablet (3.125 mg total) by mouth 2 (two) times daily. 180 tablet 3  . CINNAMON PO Take 2,000 mg by mouth daily.    Water engineer Bandages & Supports (MEDICAL COMPRESSION STOCKINGS) MISC Knee High 20-30 mm compression stockings 2 each 0  . ezetimibe (ZETIA) 10 MG tablet Take 1 tablet (10 mg total) by mouth daily. 676 tablet 3  . folic acid (FOLVITE) 1 MG tablet Take 1 mg by mouth daily.     . furosemide (LASIX) 40 MG tablet Take 1 tablet (40 mg total) by mouth daily as needed. 30 tablet 6  . losartan (COZAAR) 50 MG tablet TAKE 1 TABLET(50 MG) BY MOUTH DAILY 30 tablet 11  . metFORMIN (GLUCOPHAGE) 1000 MG tablet Take 1,000 mg by mouth 2 (two) times daily with a meal.    . omeprazole (PRILOSEC) 40 MG capsule TAKE 1 CAPSULE(40 MG) BY MOUTH DAILY AS NEEDED FOR HEARTBURN 90 capsule 0  . pioglitazone (ACTOS) 15 MG tablet Take 15 mg by mouth daily.    . simvastatin (ZOCOR) 40 MG tablet Take 1 tablet (40 mg total) by mouth daily at 6 PM. 30 tablet 11  . warfarin (COUMADIN) 5 MG tablet Take 5 mg by mouth as directed.     No facility-administered medications prior to visit.      Allergies:   Morphine and related   Social History   Socioeconomic History  . Marital status: Married    Spouse name: Not on file  . Number of children: Not on file  . Years of education: Not on file  . Highest education level: Not on file  Occupational History  . Not on file  Social Needs  . Financial resource strain: Not on file  . Food insecurity:    Worry: Not on file    Inability: Not on file  . Transportation needs:    Medical: Not on file    Non-medical: Not on file  Tobacco Use  . Smoking status: Never Smoker  . Smokeless tobacco: Never Used  Substance and Sexual Activity  . Alcohol use: No    Comment: none since 1979  . Drug use: No  . Sexual activity: Not on file  Lifestyle  . Physical activity:    Days per week: Not on file    Minutes per session: Not on file   . Stress: Not on file  Relationships  . Social connections:    Talks on phone: Not on file    Gets together: Not on file    Attends religious service: Not on file    Active member of club or organization: Not on file    Attends meetings of clubs or organizations: Not on file    Relationship status: Not on file  Other Topics Concern  . Not on file  Social History Narrative  . Not on file     Family  History:  The patient's family history includes Diabetes in his brother and mother; Hypertension in his brother.   ROS:   Please see the history of present illness.    ROS All other systems reviewed and are negative.   PHYSICAL EXAM:   VS:  BP 134/82 (BP Location: Left Arm)   Pulse 67   Ht 5' 6" (1.676 m)   Wt 261 lb 6.4 oz (118.6 kg)   BMI 42.19 kg/m    GEN: Well nourished, well developed, in no acute distress  HEENT: normal  Neck: no JVD, carotid bruits, or masses Cardiac: RRR; no murmurs, rubs, or gallops,no edema  Respiratory:  clear to auscultation bilaterally, normal work of breathing GI: soft, nontender, nondistended, + BS MS: no deformity or atrophy  Skin: warm and dry, no rash Neuro:  Alert and Oriented x 3, Strength and sensation are intact Psych: euthymic mood, full affect  Wt Readings from Last 3 Encounters:  04/19/18 261 lb 6.4 oz (118.6 kg)  03/02/18 257 lb 3.2 oz (116.7 kg)  03/24/17 249 lb (112.9 kg)      Studies/Labs Reviewed:   EKG:  EKG is not ordered today.   Recent Labs: 03/09/2018: BUN 18; Creatinine, Ser 0.80; Potassium 4.2; Sodium 144   Lipid Panel    Component Value Date/Time   CHOL  06/26/2007 1510    140        ATP III CLASSIFICATION:  <200     mg/dL   Desirable  200-239  mg/dL   Borderline High  >=240    mg/dL   High   TRIG 124 06/26/2007 1510   HDL 39 (L) 06/26/2007 1510   CHOLHDL 3.6 06/26/2007 1510   VLDL 25 06/26/2007 1510   LDLCALC  06/26/2007 1510    76        Total Cholesterol/HDL:CHD Risk Coronary Heart Disease Risk  Table                     Men   Women  1/2 Average Risk   3.4   3.3    Additional studies/ records that were reviewed today include:   Echo 03/07/2018 LV EF: 60% -   65% Study Conclusions  - Left ventricle: Wall thickness was increased in a pattern of   moderate LVH. Systolic function was normal. The estimated   ejection fraction was in the range of 60% to 65%. Wall motion was   normal; there were no regional wall motion abnormalities. The   study is not technically sufficient to allow evaluation of LV   diastolic function. - Left atrium: The atrium was normal in size. - Right atrium: The atrium was mildly dilated. - Tricuspid valve: There was mild regurgitation. - Pulmonary arteries: PA peak pressure: 32 mm Hg (S). - Inferior vena cava: The vessel was normal in size. The   respirophasic diameter changes were in the normal range (>= 50%),   consistent with normal central venous pressure.  Impressions:  - LVEF 60-65%, moderate LVH, normal wall motion, normal LA size,   mild RAE, mild TR, RVSP 32 mmHg, normal IVC.    ASSESSMENT:    1. Bilateral leg edema   2. Medication management   3. Coronary artery disease involving coronary bypass graft of native heart without angina pectoris   4. DOE (dyspnea on exertion)   5. Other chronic pulmonary embolism without acute cor pulmonale (HCC)   6. Hyperlipidemia, unspecified hyperlipidemia type      PLAN:  In order of problems listed above:  1. Bilateral lower extremity edema: Although Lasix is currently listed as 40 mg as needed, patient actually has been taking Lasix at 40 mg daily since the last visit.  He did hold the Lasix for the past 3 days due to his travel to [place] to visit his family member, now he has 2+ bilateral lower extremity pitting edema, he will restart 40 mg daily of Lasix.  He will need basic metabolic panel prior to the next office visit.  2. Dyspnea on exertion: He has chronic dyspnea on exertion,  echocardiogram was normal, Myoview was also low risk back in 2017.  Degree of dyspnea on exertion has not changed recently  3. CAD s/p CABG: Denies any recent chest pain.  On aspirin and statin  4. History of recurrent PE on Coumadin: INR has been well controlled  5. Hyperlipidemia: Continue Zocor 40 mg daily    Medication Adjustments/Labs and Tests Ordered: Current medicines are reviewed at length with the patient today.  Concerns regarding medicines are outlined above.  Medication changes, Labs and Tests ordered today are listed in the Patient Instructions below. Patient Instructions  Medication Instructions: Resume lasix  If you need a refill on your cardiac medications before your next appointment, please call your pharmacy.   Labs:  Your physician recommends that you return for lab work in: Before appointment with Dr Gwenlyn Found on  06/01/18  (BMET)   Follow-Up: Your physician wants you to keep current schedule appointment.  Special Instructions:    Thank you for choosing Heartcare at [facility], [place], [place]  04/19/2018 1:43 PM    [facility] [place], [place], [place]  [address] Phone: [phone]; Fax: [phone]

## 2018-04-19 NOTE — Patient Instructions (Addendum)
Medication Instructions: Resume lasix  If you need a refill on your cardiac medications before your next appointment, please call your pharmacy.   Labs:  Your physician recommends that you return for lab work in: Before appointment with Dr Gwenlyn Found on  06/01/18  (BMET)   Follow-Up: Your physician wants you to keep current schedule appointment.  Special Instructions:    Thank you for choosing Heartcare at [facility]!!

## 2018-05-02 DIAGNOSIS — M25512 Pain in left shoulder: Secondary | ICD-10-CM | POA: Diagnosis not present

## 2018-05-02 DIAGNOSIS — M79605 Pain in left leg: Secondary | ICD-10-CM | POA: Diagnosis not present

## 2018-05-02 DIAGNOSIS — Z7901 Long term (current) use of anticoagulants: Secondary | ICD-10-CM | POA: Diagnosis not present

## 2018-05-06 ENCOUNTER — Other Ambulatory Visit: Payer: Self-pay | Admitting: Cardiovascular Disease

## 2018-05-11 DIAGNOSIS — M25512 Pain in left shoulder: Secondary | ICD-10-CM | POA: Diagnosis not present

## 2018-05-22 ENCOUNTER — Other Ambulatory Visit: Payer: Self-pay | Admitting: Cardiovascular Disease

## 2018-05-28 DIAGNOSIS — Z7901 Long term (current) use of anticoagulants: Secondary | ICD-10-CM | POA: Diagnosis not present

## 2018-05-28 DIAGNOSIS — M25512 Pain in left shoulder: Secondary | ICD-10-CM | POA: Diagnosis not present

## 2018-05-28 DIAGNOSIS — R202 Paresthesia of skin: Secondary | ICD-10-CM | POA: Diagnosis not present

## 2018-06-01 ENCOUNTER — Ambulatory Visit: Payer: Medicare Other | Admitting: Cardiovascular Disease

## 2018-06-04 DIAGNOSIS — M25512 Pain in left shoulder: Secondary | ICD-10-CM | POA: Diagnosis not present

## 2018-06-05 ENCOUNTER — Encounter: Payer: Self-pay | Admitting: Cardiovascular Disease

## 2018-06-05 ENCOUNTER — Ambulatory Visit (INDEPENDENT_AMBULATORY_CARE_PROVIDER_SITE_OTHER): Payer: Medicare Other | Admitting: Cardiovascular Disease

## 2018-06-05 VITALS — BP 106/70 | HR 61 | Ht 66.0 in | Wt 253.2 lb

## 2018-06-05 DIAGNOSIS — E785 Hyperlipidemia, unspecified: Secondary | ICD-10-CM | POA: Diagnosis not present

## 2018-06-05 DIAGNOSIS — I10 Essential (primary) hypertension: Secondary | ICD-10-CM

## 2018-06-05 DIAGNOSIS — I2510 Atherosclerotic heart disease of native coronary artery without angina pectoris: Secondary | ICD-10-CM | POA: Diagnosis not present

## 2018-06-05 DIAGNOSIS — M542 Cervicalgia: Secondary | ICD-10-CM | POA: Diagnosis not present

## 2018-06-05 DIAGNOSIS — I25810 Atherosclerosis of coronary artery bypass graft(s) without angina pectoris: Secondary | ICD-10-CM

## 2018-06-05 DIAGNOSIS — R209 Unspecified disturbances of skin sensation: Secondary | ICD-10-CM | POA: Diagnosis not present

## 2018-06-05 NOTE — Assessment & Plan Note (Signed)
History of CAD status post coronary artery bypass grafting times 29 January 2002.  He denies chest pain or shortness of breath.  His last Myoview stress test performed 05/30/2016 was low risk on my review.

## 2018-06-05 NOTE — Assessment & Plan Note (Signed)
History of essential hypertension her blood pressure measured today at 106/70.  He is on carvedilol and losartan.  Continue current meds at current dosing.

## 2018-06-05 NOTE — Progress Notes (Signed)
06/05/2018 Eduardo Butler   [birth year]  [id number]  Primary Physician Orpah Melter, MD Primary Cardiologist: Lorretta Harp MD FACP, [place], [place], [place]  HPI:  Eduardo Butler is a 67 y.o.  moderately overweight married Caucasian male, father of 2, grandfather to 2 grandchildren who I last saw in the 03/02/2018. He has a history of CAD status post coronary artery bypass grafting x4 in March of 2003. 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 [name] follows. His other problems include hyperlipidemia and erectile dysfunction. I catheterized him at the [facility] August 17, 2006 revealing patent grafts with normal LV function. His last Myoview performed 01/04/13 was nonischemic Dr. Doyle Askew follows his lipid profile. When I saw him 2 months ago he was complaining of dyspnea on exertion. He saw one of our PAs a month later with increasing dyspnea on exertion. His diastolic was cut in half but remains bradycardic although he was clinically improved. His when necessary hydrochlorothiazide was changed to when necessary Lasix which she took over the weekend for several doses which resulted in improved dyspnea and decreased edema. I did discuss with him today so restriction. A 2-D echo was ordered that showed normal FUNCTION in a Myoview stress test that showed apical thinning with new ischemia in the inferior wall compared to a previous Myoview 2 years ago. Since I saw him3 months ago he continued to do well.  He says he is stronger.  I did adjust his diuretics which improved his lower extremity edema.  He also admits to dietary indiscretion.  He denies chest pain does have chronic dyspnea on exertion.   Current Meds  Medication Sig  . ALPRAZolam (XANAX) 0.5 MG tablet Take 0.5 mg by mouth as needed for anxiety or sleep.  . Ascorbic Acid (VITAMIN C) 1000 MG tablet Take 1,000 mg by mouth daily.  [name] aspirin EC 81 MG  tablet Take 81 mg by mouth daily.  . carvedilol (COREG) 3.125 MG tablet TAKE 1 TABLET(3.125 MG) BY MOUTH TWICE DAILY  . CINNAMON PO Take 2,000 mg by mouth daily.  Water engineer Bandages & Supports (MEDICAL COMPRESSION STOCKINGS) MISC Knee High 20-30 mm compression stockings  . ezetimibe (ZETIA) 10 MG tablet Take 1 tablet (10 mg total) by mouth daily.  . folic acid (FOLVITE) 1 MG tablet Take 1 mg by mouth daily.   . furosemide (LASIX) 40 MG tablet Take 1 tablet (40 mg total) by mouth daily as needed.  [name] losartan (COZAAR) 50 MG tablet TAKE 1 TABLET(50 MG) BY MOUTH DAILY  . metFORMIN (GLUCOPHAGE) 1000 MG tablet Take 1,000 mg by mouth 2 (two) times daily with a meal.  . omeprazole (PRILOSEC) 40 MG capsule TAKE 1 CAPSULE(40 MG) BY MOUTH DAILY AS NEEDED FOR HEARTBURN  . pioglitazone (ACTOS) 15 MG tablet Take 15 mg by mouth daily.  . simvastatin (ZOCOR) 40 MG tablet Take 1 tablet (40 mg total) by mouth daily at 6 PM.  . warfarin (COUMADIN) 5 MG tablet Take 5 mg by mouth as directed.  . [DISCONTINUED] dexlansoprazole (DEXILANT) 60 MG capsule Take 60 mg by mouth as needed.      Allergies  Allergen Reactions  . Morphine And Related     hallucinations    Social History   Socioeconomic History  . Marital status: Married    Spouse name: Not on file  . Number of children: Not on file  . Years of  education: Not on file  . Highest education level: Not on file  Occupational History  . Not on file  Social Needs  . Financial resource strain: Not on file  . Food insecurity:    Worry: Not on file    Inability: Not on file  . Transportation needs:    Medical: Not on file    Non-medical: Not on file  Tobacco Use  . Smoking status: Never Smoker  . Smokeless tobacco: Never Used  Substance and Sexual Activity  . Alcohol use: No    Comment: none since 1979  . Drug use: No  . Sexual activity: Not on file  Lifestyle  . Physical activity:    Days per week: Not on file    Minutes per session: Not on  file  . Stress: Not on file  Relationships  . Social connections:    Talks on phone: Not on file    Gets together: Not on file    Attends religious service: Not on file    Active member of club or organization: Not on file    Attends meetings of clubs or organizations: Not on file    Relationship status: Not on file  . Intimate partner violence:    Fear of current or ex partner: Not on file    Emotionally abused: Not on file    Physically abused: Not on file    Forced sexual activity: Not on file  Other Topics Concern  . Not on file  Social History Narrative  . Not on file     Review of Systems: General: negative for chills, fever, night sweats or weight changes.  Cardiovascular: negative for chest pain, dyspnea on exertion, edema, orthopnea, palpitations, paroxysmal nocturnal dyspnea or shortness of breath Dermatological: negative for rash Respiratory: negative for cough or wheezing Urologic: negative for hematuria Abdominal: negative for nausea, vomiting, diarrhea, bright red blood per rectum, melena, or hematemesis Neurologic: negative for visual changes, syncope, or dizziness All other systems reviewed and are otherwise negative except as noted above.    Blood pressure 106/70, pulse 61, height 5\' 6"  (1.676 m), weight 253 lb 3.2 oz (114.9 kg).  General appearance: alert and no distress Neck: no adenopathy, no carotid bruit, no JVD, supple, symmetrical, trachea midline and thyroid not enlarged, symmetric, no tenderness/mass/nodules Lungs: clear to auscultation bilaterally Heart: regular rate and rhythm, S1, S2 normal, no murmur, click, rub or gallop Extremities: Mild lower extremity edema Pulses: 2+ and symmetric Skin: Skin color, texture, turgor normal. No rashes or lesions Neurologic: Alert and oriented X 3, normal strength and tone. Normal symmetric reflexes. Normal coordination and gait  EKG not performed today  ASSESSMENT AND PLAN:   Hyperlipidemia History of  hyperlipidemia Zetia and simvastatin.  Essential hypertension History of essential hypertension her blood pressure measured today at 106/70.  He is on carvedilol and losartan.  Continue current meds at current dosing.  Coronary artery disease History of CAD status post coronary artery bypass grafting times 29 January 2002.  He denies chest pain or shortness of breath.  His last Myoview stress test performed 05/30/2016 was low risk on my review.      Lorretta Harp MD FACP,FACC,FAHA, [facility] 06/05/2018 11:51 AM

## 2018-06-05 NOTE — Assessment & Plan Note (Signed)
History of hyperlipidemia Zetia and simvastatin.

## 2018-06-05 NOTE — Patient Instructions (Signed)
Medication Instructions:   NO CHANGE  Follow-Up:  Your physician wants you to follow-up in: 6 MONTHS WITH HAO MENG PA You will receive a reminder letter in the mail two months in advance. If you don't receive a letter, please call our office to schedule the follow-up appointment.   Your physician wants you to follow-up in: [place] will receive a reminder letter in the mail two months in advance. If you don't receive a letter, please call our office to schedule the follow-up appointment.   REFERRAL TO NUTRITION

## 2018-06-11 DIAGNOSIS — M25512 Pain in left shoulder: Secondary | ICD-10-CM | POA: Diagnosis not present

## 2018-06-12 ENCOUNTER — Encounter (HOSPITAL_COMMUNITY): Payer: Self-pay

## 2018-06-12 ENCOUNTER — Emergency Department (HOSPITAL_COMMUNITY)
Admission: EM | Admit: 2018-06-12 | Discharge: 2018-06-12 | Disposition: A | Discharge status: Home / Self Care | Payer: Medicare Other | Attending: Emergency Medicine | Admitting: Emergency Medicine

## 2018-06-12 DIAGNOSIS — R0902 Hypoxemia: Secondary | ICD-10-CM | POA: Diagnosis not present

## 2018-06-12 DIAGNOSIS — Z7901 Long term (current) use of anticoagulants: Secondary | ICD-10-CM | POA: Insufficient documentation

## 2018-06-12 DIAGNOSIS — I2510 Atherosclerotic heart disease of native coronary artery without angina pectoris: Secondary | ICD-10-CM | POA: Diagnosis not present

## 2018-06-12 DIAGNOSIS — I110 Hypertensive heart disease with heart failure: Secondary | ICD-10-CM | POA: Diagnosis not present

## 2018-06-12 DIAGNOSIS — Z951 Presence of aortocoronary bypass graft: Secondary | ICD-10-CM | POA: Insufficient documentation

## 2018-06-12 DIAGNOSIS — K529 Noninfective gastroenteritis and colitis, unspecified: Secondary | ICD-10-CM | POA: Diagnosis not present

## 2018-06-12 DIAGNOSIS — R112 Nausea with vomiting, unspecified: Secondary | ICD-10-CM | POA: Diagnosis not present

## 2018-06-12 DIAGNOSIS — Z7984 Long term (current) use of oral hypoglycemic drugs: Secondary | ICD-10-CM | POA: Diagnosis not present

## 2018-06-12 DIAGNOSIS — I10 Essential (primary) hypertension: Secondary | ICD-10-CM | POA: Diagnosis not present

## 2018-06-12 DIAGNOSIS — R1111 Vomiting without nausea: Secondary | ICD-10-CM | POA: Diagnosis not present

## 2018-06-12 DIAGNOSIS — I509 Heart failure, unspecified: Secondary | ICD-10-CM | POA: Insufficient documentation

## 2018-06-12 DIAGNOSIS — Z79899 Other long term (current) drug therapy: Secondary | ICD-10-CM | POA: Insufficient documentation

## 2018-06-12 DIAGNOSIS — E119 Type 2 diabetes mellitus without complications: Secondary | ICD-10-CM | POA: Insufficient documentation

## 2018-06-12 DIAGNOSIS — R110 Nausea: Secondary | ICD-10-CM | POA: Diagnosis not present

## 2018-06-12 DIAGNOSIS — Z7982 Long term (current) use of aspirin: Secondary | ICD-10-CM | POA: Insufficient documentation

## 2018-06-12 LAB — CBC WITH DIFFERENTIAL/PLATELET
Basophils Absolute: 0 10*3/uL (ref 0.0–0.1)
Basophils Relative: 0 %
Eosinophils Absolute: 0 10*3/uL (ref 0.0–0.7)
Eosinophils Relative: 0 %
HCT: 38.6 % — ABNORMAL LOW (ref 39.0–52.0)
Hemoglobin: 12.5 g/dL — ABNORMAL LOW (ref 13.0–17.0)
Lymphocytes Relative: 6 %
Lymphs Abs: 0.7 10*3/uL (ref 0.7–4.0)
MCH: 28.1 pg (ref 26.0–34.0)
MCHC: 32.4 g/dL (ref 30.0–36.0)
MCV: 86.7 fL (ref 78.0–100.0)
Monocytes Absolute: 0.7 10*3/uL (ref 0.1–1.0)
Monocytes Relative: 6 %
Neutro Abs: 9.8 10*3/uL — ABNORMAL HIGH (ref 1.7–7.7)
Neutrophils Relative %: 88 %
Platelets: 263 10*3/uL (ref 150–400)
RBC: 4.45 MIL/uL (ref 4.22–5.81)
RDW: 14.5 % (ref 11.5–15.5)
WBC: 11.2 10*3/uL — ABNORMAL HIGH (ref 4.0–10.5)

## 2018-06-12 LAB — COMPREHENSIVE METABOLIC PANEL
ALT: 20 U/L (ref 0–44)
AST: 21 U/L (ref 15–41)
Albumin: 4.1 g/dL (ref 3.5–5.0)
Alkaline Phosphatase: 46 U/L (ref 38–126)
Anion gap: 11 (ref 5–15)
BUN: 22 mg/dL (ref 8–23)
CO2: 24 mmol/L (ref 22–32)
Calcium: 9.1 mg/dL (ref 8.9–10.3)
Chloride: 103 mmol/L (ref 98–111)
Creatinine, Ser: 0.92 mg/dL (ref 0.61–1.24)
GFR calc Af Amer: >60 mL/min (ref >60)
GFR calc non Af Amer: >60 mL/min (ref >60)
Glucose, Bld: 263 mg/dL — ABNORMAL HIGH (ref 70–99)
Potassium: 3.8 mmol/L (ref 3.5–5.1)
Sodium: 138 mmol/L (ref 135–145)
Total Bilirubin: 0.9 mg/dL (ref 0.3–1.2)
Total Protein: 7.4 g/dL (ref 6.5–8.1)

## 2018-06-12 MED ORDER — KETOROLAC TROMETHAMINE 30 MG/ML IJ SOLN
30.0000 mg | Freq: Once | INTRAMUSCULAR | Status: AC
  Administered 2018-06-12: 30 mg via INTRAVENOUS
  Filled 2018-06-12: qty 1

## 2018-06-12 MED ORDER — ONDANSETRON 8 MG PO TBDP: ORAL_TABLET | ORAL | 0 refills | Status: DC

## 2018-06-12 MED ORDER — ONDANSETRON HCL 4 MG/2ML IJ SOLN
4.0000 mg | Freq: Once | INTRAMUSCULAR | Status: AC
  Administered 2018-06-12: 4 mg via INTRAVENOUS
  Filled 2018-06-12: qty 2

## 2018-06-12 MED ORDER — SODIUM CHLORIDE 0.9 % IV BOLUS
1000.0000 mL | Freq: Once | INTRAVENOUS | Status: AC
  Administered 2018-06-12: 1000 mL via INTRAVENOUS

## 2018-06-12 NOTE — Discharge Instructions (Addendum)
Zofran as prescribed as needed for nausea.  Clear liquid diet for the next 12 hours, then slowly advance to normal as tolerated.  Return to the emergency department for severe abdominal pain, bloody stools, high fevers, or other new and concerning symptoms.

## 2018-06-12 NOTE — ED Provider Notes (Signed)
[facility] DEPT Provider Note   CSN: [id number] Arrival date & time: 06/12/18  0247     History   Chief Complaint Chief Complaint  Patient presents with  . Vomiting  . Diarrhea    HPI Eduardo Butler is a 67 y.o. male.  Patient is a 67 year old male with past medical history of coronary artery disease with CABG, diabetes, CHF, obesity.  He presents today with nausea, vomiting, diarrhea that started earlier this evening.  He reports having dinner at a restaurant prior to the onset of symptoms.  He denies any bloody stool.  He denies any significant abdominal pain or fever.  He denies any ill contacts.  He was given a medication by EMS and is now feeling better.  The history is provided by the patient.  Diarrhea   This is a new problem. The current episode started 3 to 5 hours ago. The problem occurs continuously. The problem has been gradually improving. There has been no fever. Associated symptoms include vomiting. Pertinent negatives include no chills.    Past Medical History:  Diagnosis Date  . Anxiety   . Arthritis   . CHF (congestive heart failure) ([place])   . Coronary artery disease   . Diabetes ([place])   . H/O cardiac catheterization 08/25/2006   normal cath-patent grafts  . H/O Doppler ultrasound 02/21/2011   lower ext.venous doppler,, no treatment except support stockings are recommended if clinically indicated  . H/O echocardiogram 12/02/2010   EF 55% , no significant abnormalities  . History of stress test 01/04/2013   Lexiscan Myoview ; scattered PVC's ; LV EF 54% ; Normal stress test   . Hyperlipidemia   . Hypertension   . Obesity   . Peroneal DVT (deep venous thrombosis) ([place])   . Shortness of breath     Patient Active Problem List   Diagnosis Date Noted  . 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 Long term (current) use of anticoagulants 07/02/2014  . Lupus anticoagulant positive 07/02/2014  .  Essential hypertension 03/03/2014  . Coronary artery disease 03/03/2014  . Umbilical hernia 40/98/1191  . Hyperlipidemia 01/07/2011  . ANXIETY 01/07/2011  . 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 DEEP VENOUS THROMBOPHLEBITIS, RECURRENT 01/07/2011  . WEIGHT GAIN, ABNORMAL 01/07/2011  . DYSPNEA 01/07/2011    Past Surgical History:  Procedure Laterality Date  . BYPASS GRAFT  2003   4 bypass   . CARDIAC CATHETERIZATION  2002-02-07   S/P sudden cardiac death; three vessel coronary artery disease; preserved overall left ventricular systolic function with loculated mild to moderate anterolateral hypokinesis; no mitral regurgitation noted  . CORONARY ARTERY BYPASS GRAFT  2003  . HERNIA REPAIR    . LUMBAR LAMINECTOMY/ DECOMPRESSION WITH MET-RX Left 07/08/2014   Procedure: Left Lumbar Four-Five Metrex foraminal microdiskectomy;  Surgeon: Consuella Lose, MD;  Location: [facility];  Service: Neurosurgery;  Laterality: Left;  Left Lumbar Four-Five Metrex foraminal microdiskectomy  . PELVIC LAPAROSCOPY  2005  . SHOULDER SURGERY    . UMBILICAL HERNIA REPAIR  11/17/2011   Procedure: HERNIA REPAIR UMBILICAL ADULT;  Surgeon: Harl Bowie, MD;  Location: [facility];  Service: General;  Laterality: N/A;  umbilical hernia repair with mesh        Home Medications    Prior to Admission medications   Medication Sig Start Date End Date Taking? Authorizing Provider  acetaminophen (TYLENOL) 500 MG tablet Take 1,000-1,500 mg by mouth every 6 (six) hours  as needed for moderate pain.   Yes [provider]  ALPRAZolam Duanne Moron) 0.5 MG tablet Take 0.5 mg by mouth as needed for anxiety or sleep.   Yes [provider]  Ascorbic Acid (VITAMIN C) 1000 MG tablet Take 1,000 mg by mouth daily.   Yes [provider]  aspirin EC 81 MG tablet Take 81 mg by mouth daily.   Yes [provider]  carvedilol (COREG) 3.125 MG tablet TAKE 1 TABLET(3.125 MG) BY  MOUTH TWICE DAILY 05/23/18  Yes Lorretta Harp, MD  CINNAMON PO Take 2,000 mg by mouth daily.   Yes [provider]  ezetimibe (ZETIA) 10 MG tablet Take 1 tablet (10 mg total) by mouth daily. 05/01/15  Yes Lorretta Harp, MD  folic acid (FOLVITE) 1 MG tablet Take 1 mg by mouth daily.    Yes [provider]  furosemide (LASIX) 40 MG tablet Take 1 tablet (40 mg total) by mouth daily as needed. Patient taking differently: Take 40 mg by mouth daily as needed for fluid.  03/02/18  Yes Lorretta Harp, MD  losartan (COZAAR) 50 MG tablet TAKE 1 TABLET(50 MG) BY MOUTH DAILY 05/07/18  Yes Lorretta Harp, MD  metFORMIN (GLUCOPHAGE) 1000 MG tablet Take 1,000 mg by mouth 2 (two) times daily with a meal.   Yes [provider]  omeprazole (PRILOSEC) 40 MG capsule TAKE 1 CAPSULE(40 MG) BY MOUTH DAILY AS NEEDED FOR HEARTBURN 11/30/17  Yes Lorretta Harp, MD  pioglitazone (ACTOS) 15 MG tablet Take 15 mg by mouth daily.   Yes [provider]  simvastatin (ZOCOR) 40 MG tablet Take 1 tablet (40 mg total) by mouth daily at 6 PM. 04/12/16  Yes Lorretta Harp, MD  warfarin (COUMADIN) 3 MG tablet Take 6 mg by mouth every Wednesday.   Yes [provider]  warfarin (COUMADIN) 5 MG tablet Take 5 mg by mouth as directed. Pt takes every day but on Wed's takes 6 mg   Yes [provider]  Elastic Bandages & Supports (MEDICAL COMPRESSION STOCKINGS) MISC Knee High 20-30 mm compression stockings 03/02/18   Lorretta Harp, MD  dexlansoprazole (DEXILANT) 60 MG capsule Take 60 mg by mouth as needed.   06/05/18  [provider]  tadalafil (CIALIS) 5 MG tablet Take 5 mg by mouth as needed.   02/09/12  [provider]    Family History Family History  Problem Relation Age of Onset  . Diabetes Mother   . Hypertension Brother   . Diabetes Brother   . Heart attack Neg Hx   . Stroke Neg Hx     Social History Social History   Tobacco Use  . Smoking  status: Never Smoker  . Smokeless tobacco: Never Used  Substance Use Topics  . Alcohol use: No    Comment: none since 1979  . Drug use: No     Allergies   Morphine and related   Review of Systems Review of Systems  Constitutional: Negative for chills.  Gastrointestinal: Positive for diarrhea and vomiting.  All other systems reviewed and are negative.    Physical Exam Updated Vital Signs BP (!) 153/79 (BP Location: Right Arm)   Pulse 86   Resp 20   SpO2 93%   Physical Exam  Constitutional: He is oriented to person, place, and time. He appears well-developed and well-nourished. No distress.  HENT:  Head: Normocephalic and atraumatic.  Mouth/Throat: Oropharynx is clear and moist.  Neck: Normal range  of motion. Neck supple.  Cardiovascular: Normal rate and regular rhythm. Exam reveals no friction rub.  No murmur heard. Pulmonary/Chest: Effort normal and breath sounds normal. No respiratory distress. He has no wheezes. He has no rales.  Abdominal: Soft. Bowel sounds are normal. He exhibits no distension. There is no tenderness.  Musculoskeletal: Normal range of motion. He exhibits no edema.  Neurological: He is alert and oriented to person, place, and time. Coordination normal.  Skin: Skin is warm and dry. He is not diaphoretic.  Nursing note and vitals reviewed.    ED Treatments / Results  Labs (all labs ordered are listed, but only abnormal results are displayed) Labs Reviewed - No data to display  EKG None  Radiology No results found.  Procedures Procedures (including critical care time)  Medications Ordered in ED Medications  sodium chloride 0.9 % bolus 1,000 mL (has no administration in time range)  ondansetron (ZOFRAN) injection 4 mg (has no administration in time range)  ketorolac (TORADOL) 30 MG/ML injection 30 mg (has no administration in time range)     Initial Impression / Assessment and Plan / ED Course  I have reviewed the triage vital signs  and the nursing notes.  Pertinent labs & imaging results that were available during my care of the patient were reviewed by me and considered in my medical decision making (see chart for details).  Patient presents with nausea, vomiting, diarrhea that sounds either viral or foodborne in nature.  Either way I suspect this illness will be self-limited.  He is feeling better after receiving fluids and Zofran.  He will be discharged with Zofran and follow-up as needed.  He appears well-hydrated and abdominal exam is benign.  Final Clinical Impressions(s) / ED Diagnoses   Final diagnoses:  None    ED Discharge Orders    None       Veryl Speak, MD 06/12/18 [phone]

## 2018-06-12 NOTE — ED Triage Notes (Signed)
Pt complains of n,v,d since 2030 last night Pt also states that his lower extremities are swelling EMS gave 4mg  zofran IM

## 2018-06-15 DIAGNOSIS — M25512 Pain in left shoulder: Secondary | ICD-10-CM | POA: Diagnosis not present

## 2018-06-27 ENCOUNTER — Ambulatory Visit: Payer: Medicare Other | Admitting: *Deleted

## 2018-07-10 DIAGNOSIS — Z7901 Long term (current) use of anticoagulants: Secondary | ICD-10-CM | POA: Diagnosis not present

## 2018-07-17 ENCOUNTER — Encounter: Payer: Medicare Other | Attending: Cardiovascular Disease | Admitting: *Deleted

## 2018-07-17 DIAGNOSIS — E119 Type 2 diabetes mellitus without complications: Secondary | ICD-10-CM

## 2018-07-17 DIAGNOSIS — E118 Type 2 diabetes mellitus with unspecified complications: Secondary | ICD-10-CM | POA: Diagnosis not present

## 2018-07-17 DIAGNOSIS — Z713 Dietary counseling and surveillance: Secondary | ICD-10-CM | POA: Diagnosis not present

## 2018-07-17 DIAGNOSIS — I10 Essential (primary) hypertension: Secondary | ICD-10-CM | POA: Insufficient documentation

## 2018-07-17 DIAGNOSIS — E785 Hyperlipidemia, unspecified: Secondary | ICD-10-CM | POA: Diagnosis not present

## 2018-07-17 NOTE — Patient Instructions (Signed)
Plan:  Aim for 4 Carb Choices per meal (60 grams) +/- 1 either way  Aim for 0-2 Carbs per snack if hungry  Include protein in moderation with your meals and snacks Be aware of Sodium limit of 2300 mg/day or 600 mg/meal to help decrease water retention in your body Limit animal sources of fat ( saturated fats) and choose plant sources instead Consider reading food labels for Total Carbohydrate of foods Continue with your activity level by walking, mowing and playing golf  daily as tolerated Consider checking BG at alternate times per day  Continue taking medication as directed by MD

## 2018-07-19 NOTE — Progress Notes (Signed)
Diabetes Self-Management Education  Visit Type: First/Initial  Appt. Start Time: 1400 Appt. End Time: 5456  07/19/2018  [name], identified by name and date of birth, is a 67 y.o. male with a diagnosis of Diabetes: Type 2. Patient here with wife who participated in the visit. He is retired from [employer], but states he is active daily with traveling, golf, church and family. He mows his own grass with push mower. He has not been checking his BG but states he does have a meter. He is trying to limit salt due to edema and heart problems. He states his last A1c was 6.5%.  ASSESSMENT  Height 5\' 6"  (1.676 m), weight 250 lb (113.4 kg). Body mass index is 40.35 kg/m.  Diabetes Self-Management Education - 07/17/18 1425      Visit Information   Visit Type  First/Initial      Initial Visit   Diabetes Type  Type 2    Are you currently following a meal plan?  Yes    What type of meal plan do you follow?  don't eat it all    Are you taking your medications as prescribed?  Yes    Date Diagnosed  2015      Health Coping   How would you rate your overall health?  Good      Psychosocial Assessment   Patient Belief/Attitude about Diabetes  Other (comment)   concerned all the time   Self-care barriers  None    Self-management support  Family    Other persons present  Patient;Spouse/SO    Patient Concerns  Nutrition/Meal planning;Glycemic Control    Special Needs  None    Learning Readiness  Change in progress    How often do you need to have someone help you when you read instructions, pamphlets, or other written materials from your doctor or pharmacy?  1 - Never    What is the last grade level you completed in school?  some college      Pre-Education Assessment   Patient understands the diabetes disease and treatment process.  Needs Instruction    Patient understands incorporating nutritional management into lifestyle.  Needs Instruction    Patient undertands  incorporating physical activity into lifestyle.  Needs Instruction    Patient understands using medications safely.  Needs Instruction    Patient understands monitoring blood glucose, interpreting and using results  Needs Instruction    Patient understands prevention, detection, and treatment of acute complications.  Needs Instruction    Patient understands prevention, detection, and treatment of chronic complications.  Needs Instruction    Patient understands how to develop strategies to address psychosocial issues.  Needs Instruction    Patient understands how to develop strategies to promote health/change behavior.  Needs Instruction      Complications   Last HgB A1C per patient/outside source  6.5 %   per patient report   How often do you check your blood sugar?  0 times/day (not testing)    Have you had a dilated eye exam in the past 12 months?  No    Have you had a dental exam in the past 12 months?  Yes    Are you checking your feet?  Yes    How many days per week are you checking your feet?  5      Dietary Intake   Breakfast  skips most days, may eat around 9 AM; raisin bran or cheerios cereal with 1%  milk OR fast food biscuit with sausage OR tomato sandwich OR oatmeal with brown sugar    Snack (morning)  no    Lunch  eat out occasionally - meat ans starch, occasionally vegetables, OR sandwich with lean meat, tomatoes and lettuce with chips occasionally    Snack (afternoon)  fresh fruit OR oatmeal cookie    Dinner  eat out a couple times a week; OR meat or fish, starch, salad and vegetables    Snack (evening)  ice cream cone    Beverage(s)  water, coffee with powdered creamer, diet soda,       Exercise   Exercise Type  Light (walking / raking leaves)   mows own grass,    How many days per week to you exercise?  2.5    How many minutes per day do you exercise?  45    Total minutes per week of exercise  112.5      Patient Education   Disease state   Factors that contribute to  the development of diabetes    Nutrition management   Role of diet in the treatment of diabetes and the relationship between the three main macronutrients and blood glucose level;Carbohydrate counting;Food label reading, portion sizes and measuring food.;Reviewed blood glucose goals for pre and post meals and how to evaluate the patients' food intake on their blood glucose level.    Physical activity and exercise   Role of exercise on diabetes management, blood pressure control and cardiac health.    Medications  Reviewed patients medication for diabetes, action, purpose, timing of dose and side effects.    Monitoring  Purpose and frequency of SMBG.;Identified appropriate SMBG and/or A1C goals.    Acute complications  Taught treatment of hypoglycemia - the 15 rule.    Chronic complications  Relationship between chronic complications and blood glucose control    Psychosocial adjustment  Role of stress on diabetes      Individualized Goals (developed by patient)   Nutrition  Follow meal plan discussed    Physical Activity  Exercise 3-5 times per week    Medications  take my medication as prescribed    Monitoring   test blood glucose pre and post meals as discussed      Post-Education Assessment   Patient understands the diabetes disease and treatment process.  Demonstrates understanding / competency    Patient understands incorporating nutritional management into lifestyle.  Demonstrates understanding / competency    Patient undertands incorporating physical activity into lifestyle.  Demonstrates understanding / competency    Patient understands using medications safely.  Demonstrates understanding / competency    Patient understands monitoring blood glucose, interpreting and using results  Demonstrates understanding / competency    Patient understands prevention, detection, and treatment of acute complications.  Demonstrates understanding / competency    Patient understands prevention,  detection, and treatment of chronic complications.  Demonstrates understanding / competency    Patient understands how to develop strategies to address psychosocial issues.  Demonstrates understanding / competency    Patient understands how to develop strategies to promote health/change behavior.  Demonstrates understanding / competency      Outcomes   Expected Outcomes  Demonstrated interest in learning. Expect positive outcomes    Future DMSE  4-6 wks    Program Status  Completed       Individualized Plan for Diabetes Self-Management Training:   Learning Objective:  Patient will have a greater understanding of diabetes self-management. Patient education plan is to  attend individual and/or group sessions per assessed needs and concerns.   Plan:   Patient Instructions  Plan:  Aim for 4 Carb Choices per meal (60 grams) +/- 1 either way  Aim for 0-2 Carbs per snack if hungry  Include protein in moderation with your meals and snacks Be aware of Sodium limit of 2300 mg/day or 600 mg/meal to help decrease water retention in your body Limit animal sources of fat ( saturated fats) and choose plant sources instead Consider reading food labels for Total Carbohydrate of foods Continue with your activity level by walking, mowing and playing golf  daily as tolerated Consider checking BG at alternate times per day  Continue taking medication as directed by MD  Expected Outcomes:  Demonstrated interest in learning. Expect positive outcomes  Education material provided: ADA Diabetes: Your Take Control Guide, A1C conversion sheet, Meal plan card, Carbohydrate counting sheet and No sodium seasonings, Saturated Fat handout  If problems or questions, patient to contact team via:  Phone  Future DSME appointment: 4-6 wks

## 2018-07-25 DIAGNOSIS — L309 Dermatitis, unspecified: Secondary | ICD-10-CM | POA: Diagnosis not present

## 2018-07-31 DIAGNOSIS — Z86718 Personal history of other venous thrombosis and embolism: Secondary | ICD-10-CM | POA: Diagnosis not present

## 2018-07-31 DIAGNOSIS — Z7901 Long term (current) use of anticoagulants: Secondary | ICD-10-CM | POA: Diagnosis not present

## 2018-08-21 ENCOUNTER — Encounter: Payer: Medicare Other | Attending: Cardiovascular Disease | Admitting: *Deleted

## 2018-08-21 DIAGNOSIS — E119 Type 2 diabetes mellitus without complications: Secondary | ICD-10-CM

## 2018-08-21 DIAGNOSIS — E118 Type 2 diabetes mellitus with unspecified complications: Secondary | ICD-10-CM | POA: Insufficient documentation

## 2018-08-21 DIAGNOSIS — E785 Hyperlipidemia, unspecified: Secondary | ICD-10-CM | POA: Diagnosis not present

## 2018-08-21 DIAGNOSIS — I10 Essential (primary) hypertension: Secondary | ICD-10-CM | POA: Diagnosis not present

## 2018-08-21 DIAGNOSIS — Z713 Dietary counseling and surveillance: Secondary | ICD-10-CM | POA: Insufficient documentation

## 2018-08-21 NOTE — Patient Instructions (Signed)
Plan:  Aim for 4 Carb Choices per meal (60 grams) +/- 1 either way  Aim for 0-2 Carbs per snack if hungry  Include protein in moderation with your meals and snacks Be aware of Sodium limit of 2300 mg/day or 600 mg/meal to help decrease water retention in your body Limit animal sources of fat ( saturated fats) and choose plant sources instead Consider reading food labels for Total Carbohydrate of foods Continue with your activity level by walking, mowing and playing golf  daily as tolerated Continue taking medication as directed by MD  Today we talked more about ways to increase your activity level, perhaps by biking more.

## 2018-08-22 NOTE — Progress Notes (Signed)
Diabetes Self-Management Education  Visit Type:  Follow-up  Appt. Start Time: 1400 Appt. End Time: 1500  08/22/2018  [name], identified by name and date of birth, is a 67 y.o. male with a diagnosis of Diabetes: Type 2.  Patient states he wants to work on increasing his activity level now and would like suggestions. He is playing golf 1/week, using the cart, and still mows his own grass. He is interested in bike riding or going to a gym and participating in some group classes as he finds the socialization very helpful.  ASSESSMENT  Weight 251 lb 4.8 oz (114 kg). Body mass index is 40.56 kg/m.   Diabetes Self-Management Education - 08/22/18 1623      Initial Visit   What type of meal plan do you follow?  some carb counting and mostly better portion control      Health Coping   How would you rate your overall health?  Good      Individualized Goals (developed by patient)   Nutrition  General guidelines for healthy choices and portions discussed    Physical Activity  Exercise 3-5 times per week    Medications  take my medication as prescribed      Outcomes   Program Status  Completed      Subsequent Visit   Since your last visit have you continued or begun to take your medications as prescribed?  Yes    Since your last visit have you experienced any weight changes?  No change       Learning Objective:  Patient will have a greater understanding of diabetes self-management. Patient education plan is to attend individual and/or group sessions per assessed needs and concerns.   Plan:   Patient Instructions  Plan:  Aim for 4 Carb Choices per meal (60 grams) +/- 1 either way  Aim for 0-2 Carbs per snack if hungry  Include protein in moderation with your meals and snacks Be aware of Sodium limit of 2300 mg/day or 600 mg/meal to help decrease water retention in your body Limit animal sources of fat ( saturated fats) and choose plant sources instead Consider reading food  labels for Total Carbohydrate of foods Continue with your activity level by walking, mowing and playing golf  daily as tolerated Continue taking medication as directed by MD  Today we talked more about ways to increase your activity level, perhaps by biking more.  Expected Outcomes:  Demonstrated interest in learning. Expect positive outcomes  Education material provided: Arm Chair exercises, [employer] list for [facility],   If problems or questions, patient to contact team via:  Phone  Future DSME appointment: - PRN

## 2018-08-30 DIAGNOSIS — Z23 Encounter for immunization: Secondary | ICD-10-CM | POA: Diagnosis not present

## 2018-08-30 DIAGNOSIS — Z7901 Long term (current) use of anticoagulants: Secondary | ICD-10-CM | POA: Diagnosis not present

## 2018-08-30 DIAGNOSIS — E782 Mixed hyperlipidemia: Secondary | ICD-10-CM | POA: Diagnosis not present

## 2018-08-30 DIAGNOSIS — I2510 Atherosclerotic heart disease of native coronary artery without angina pectoris: Secondary | ICD-10-CM | POA: Diagnosis not present

## 2018-08-30 DIAGNOSIS — R760 Raised antibody titer: Secondary | ICD-10-CM | POA: Diagnosis not present

## 2018-08-30 DIAGNOSIS — I10 Essential (primary) hypertension: Secondary | ICD-10-CM | POA: Diagnosis not present

## 2018-08-30 DIAGNOSIS — Z86718 Personal history of other venous thrombosis and embolism: Secondary | ICD-10-CM | POA: Diagnosis not present

## 2018-08-30 DIAGNOSIS — E1149 Type 2 diabetes mellitus with other diabetic neurological complication: Secondary | ICD-10-CM | POA: Diagnosis not present

## 2018-09-07 ENCOUNTER — Other Ambulatory Visit: Payer: Self-pay | Admitting: Cardiovascular Disease

## 2018-10-05 DIAGNOSIS — Z86718 Personal history of other venous thrombosis and embolism: Secondary | ICD-10-CM | POA: Diagnosis not present

## 2018-10-05 DIAGNOSIS — Z7901 Long term (current) use of anticoagulants: Secondary | ICD-10-CM | POA: Diagnosis not present

## 2018-10-16 DIAGNOSIS — M5411 Radiculopathy, occipito-atlanto-axial region: Secondary | ICD-10-CM | POA: Diagnosis not present

## 2018-10-16 DIAGNOSIS — M9901 Segmental and somatic dysfunction of cervical region: Secondary | ICD-10-CM | POA: Diagnosis not present

## 2018-10-16 DIAGNOSIS — L821 Other seborrheic keratosis: Secondary | ICD-10-CM | POA: Diagnosis not present

## 2018-10-16 DIAGNOSIS — C44519 Basal cell carcinoma of skin of other part of trunk: Secondary | ICD-10-CM | POA: Diagnosis not present

## 2018-10-16 DIAGNOSIS — D485 Neoplasm of uncertain behavior of skin: Secondary | ICD-10-CM | POA: Diagnosis not present

## 2018-10-16 DIAGNOSIS — D1801 Hemangioma of skin and subcutaneous tissue: Secondary | ICD-10-CM | POA: Diagnosis not present

## 2018-10-18 DIAGNOSIS — M5411 Radiculopathy, occipito-atlanto-axial region: Secondary | ICD-10-CM | POA: Diagnosis not present

## 2018-10-18 DIAGNOSIS — M9901 Segmental and somatic dysfunction of cervical region: Secondary | ICD-10-CM | POA: Diagnosis not present

## 2018-10-19 DIAGNOSIS — Z86718 Personal history of other venous thrombosis and embolism: Secondary | ICD-10-CM | POA: Diagnosis not present

## 2018-10-19 DIAGNOSIS — Z7901 Long term (current) use of anticoagulants: Secondary | ICD-10-CM | POA: Diagnosis not present

## 2018-10-22 DIAGNOSIS — M5411 Radiculopathy, occipito-atlanto-axial region: Secondary | ICD-10-CM | POA: Diagnosis not present

## 2018-10-22 DIAGNOSIS — M9901 Segmental and somatic dysfunction of cervical region: Secondary | ICD-10-CM | POA: Diagnosis not present

## 2018-10-24 DIAGNOSIS — M9901 Segmental and somatic dysfunction of cervical region: Secondary | ICD-10-CM | POA: Diagnosis not present

## 2018-10-24 DIAGNOSIS — M5411 Radiculopathy, occipito-atlanto-axial region: Secondary | ICD-10-CM | POA: Diagnosis not present

## 2018-10-29 DIAGNOSIS — F419 Anxiety disorder, unspecified: Secondary | ICD-10-CM | POA: Diagnosis not present

## 2018-10-29 DIAGNOSIS — M5430 Sciatica, unspecified side: Secondary | ICD-10-CM | POA: Diagnosis not present

## 2018-10-30 ENCOUNTER — Telehealth: Payer: Self-pay | Admitting: *Deleted

## 2018-10-30 ENCOUNTER — Ambulatory Visit: Payer: Medicare Other | Admitting: *Deleted

## 2018-10-30 NOTE — Telephone Encounter (Signed)
Patient had cancelled our appointment for today, asked that I call him back, which I did. He states he had a reaction on the way to our appointment of extreme dry mouth and had to pull over.  He wanted to let me know he and his wife have joined the gym and have done some aerobic exercises as well as walking. He has also had more back pain the last couple of weeks so not able to go to gym lately.  He plans to call for another follow up appointment early in 2020.

## 2018-10-31 DIAGNOSIS — M5411 Radiculopathy, occipito-atlanto-axial region: Secondary | ICD-10-CM | POA: Diagnosis not present

## 2018-10-31 DIAGNOSIS — M9901 Segmental and somatic dysfunction of cervical region: Secondary | ICD-10-CM | POA: Diagnosis not present

## 2018-10-31 DIAGNOSIS — M5430 Sciatica, unspecified side: Secondary | ICD-10-CM | POA: Diagnosis not present

## 2018-11-01 ENCOUNTER — Ambulatory Visit: Payer: Medicare Other | Admitting: Cardiology

## 2018-11-01 ENCOUNTER — Ambulatory Visit (INDEPENDENT_AMBULATORY_CARE_PROVIDER_SITE_OTHER): Payer: Medicare Other | Admitting: Physician Assistant

## 2018-11-01 ENCOUNTER — Encounter: Payer: Self-pay | Admitting: Physician Assistant

## 2018-11-01 VITALS — BP 142/76 | HR 75 | Ht 66.0 in | Wt 255.4 lb

## 2018-11-01 DIAGNOSIS — I10 Essential (primary) hypertension: Secondary | ICD-10-CM | POA: Diagnosis not present

## 2018-11-01 DIAGNOSIS — R600 Localized edema: Secondary | ICD-10-CM

## 2018-11-01 DIAGNOSIS — E785 Hyperlipidemia, unspecified: Secondary | ICD-10-CM

## 2018-11-01 DIAGNOSIS — I2782 Chronic pulmonary embolism: Secondary | ICD-10-CM | POA: Diagnosis not present

## 2018-11-01 DIAGNOSIS — I25810 Atherosclerosis of coronary artery bypass graft(s) without angina pectoris: Secondary | ICD-10-CM

## 2018-11-01 DIAGNOSIS — I2510 Atherosclerotic heart disease of native coronary artery without angina pectoris: Secondary | ICD-10-CM | POA: Diagnosis not present

## 2018-11-01 NOTE — Patient Instructions (Signed)
Medication Instructions:  Take Lasix 40 mg daily may take a extra 40 mg if needed If you need a refill on your cardiac medications before your next appointment, please call your pharmacy.   Lab work: Art gallery manager today If you have labs (blood work) drawn today and your tests are completely normal, you will receive your results only by: [name] MyChart Message (if you have MyChart) OR . A paper copy in the mail If you have any lab test that is abnormal or we need to change your treatment, we will call you to review the results.  Testing/Procedures: None ordered  Follow-Up: At [facility], you and your health needs are our priority.  As part of our continuing mission to provide you with exceptional heart care, we have created designated Provider Care Teams.  These Care Teams include your primary Cardiologist (physician) and Advanced Practice Providers (APPs -  Physician Assistants and Nurse Practitioners) who all work together to provide you with the care you need, when you need it. . Follow Up with Dr.Berry in 6 months   Call 2 months before to schedule

## 2018-11-01 NOTE — Progress Notes (Signed)
Cardiology Office Note    Date:  11/04/2018   ID:  Eduardo Butler, Eduardo Butler 1951/02/06, MRN [id number]  PCP:  Orpah Melter, MD  Cardiologist:  Dr. Gwenlyn Found  Chief Complaint  Patient presents with  . Follow-up    seen for Dr. Gwenlyn Found.     History of Present Illness:  Eduardo Butler is a 67 y.o. male with PMH of CAD s/p CABG x 4 (sequential SVG to acute marginal and distal RCA, free radial to LCx/OM, LIMA to LAD) Feb 03, 2002, recurrent pulmonary emboli secondary to lupus anticoagulant on lifelong Coumadin, hyperlipidemia and erectile dysfunction.  He had a cardiac catheterization on 08/17/2006 revealing patent grafts with normal LV function.  Myoview in February 2014 was nonischemic.  He was evaluated for worsening dyspnea in July 2017 with myoview, this revealed EF 54%, small defect of moderate severity present in the basal inferoseptal location, small defect of severe severity present in the apex location consistent with very mild ischemia, overall intermediate risk study.  Echocardiogram obtained on 03/07/2018 showed EF 60 to 65%, moderate LVH, mild TR, PA peak pressure 32 mmHg.  Patient presents today for cardiology office visit.  He says since July of this year, he has been taking 40 mg daily of Lasix.  He sometimes uses additional dose of Lasix as needed.  Otherwise he denies any recent chest pain.  He has no lower extremity edema, orthopnea or PND.  His major concern is significant left hip pain and lower back pain.  He is undergoing further evaluation by primary care provider.  I recommend obtaining a basic metabolic panel to follow-up on his renal function and electrolyte.   Past Medical History:  Diagnosis Date  . Anxiety   . Arthritis   . CHF (congestive heart failure) ([place])   . Coronary artery disease   . Diabetes ([place])   . H/O cardiac catheterization 08/25/2006   normal cath-patent grafts  . H/O Doppler ultrasound 02/21/2011   lower ext.venous doppler,, no treatment except support stockings  are recommended if clinically indicated  . H/O echocardiogram 12/02/2010   EF 55% , no significant abnormalities  . History of stress test 01/04/2013   Lexiscan Myoview ; scattered PVC's ; LV EF 54% ; Normal stress test   . Hyperlipidemia   . Hypertension   . Obesity   . Peroneal DVT (deep venous thrombosis)   . Shortness of breath     Past Surgical History:  Procedure Laterality Date  . BYPASS GRAFT  2003   4 bypass   . CARDIAC CATHETERIZATION  02-03-02   S/P sudden cardiac death; three vessel coronary artery disease; preserved overall left ventricular systolic function with loculated mild to moderate anterolateral hypokinesis; no mitral regurgitation noted  . CORONARY ARTERY BYPASS GRAFT  2003  . HERNIA REPAIR    . LUMBAR LAMINECTOMY/ DECOMPRESSION WITH MET-RX Left 07/08/2014   Procedure: Left Lumbar Four-Five Metrex foraminal microdiskectomy;  Surgeon: Consuella Lose, MD;  Location: [facility];  Service: Neurosurgery;  Laterality: Left;  Left Lumbar Four-Five Metrex foraminal microdiskectomy  . PELVIC LAPAROSCOPY  2005  . SHOULDER SURGERY    . UMBILICAL HERNIA REPAIR  11/17/2011   Procedure: HERNIA REPAIR UMBILICAL ADULT;  Surgeon: Harl Bowie, MD;  Location: [facility];  Service: General;  Laterality: N/A;  umbilical hernia repair with mesh    Current Medications: Outpatient Medications Prior to Visit  Medication Sig Dispense Refill  . acetaminophen (TYLENOL) 500 MG tablet Take 1,000-1,500 mg by mouth  every 6 (six) hours as needed for moderate pain.    [name] ALPRAZolam (XANAX) 0.5 MG tablet Take 0.5 mg by mouth as needed for anxiety or sleep.    . Ascorbic Acid (VITAMIN C) 1000 MG tablet Take 1,000 mg by mouth daily.    [name] aspirin EC 81 MG tablet Take 81 mg by mouth daily.    . carvedilol (COREG) 3.125 MG tablet TAKE 1 TABLET(3.125 MG) BY MOUTH TWICE DAILY 180 tablet 3  . CINNAMON PO Take 2,000 mg by mouth daily.    Water engineer Bandages & Supports  (MEDICAL COMPRESSION STOCKINGS) MISC Knee High 20-30 mm compression stockings 2 each 0  . ezetimibe (ZETIA) 10 MG tablet Take 1 tablet (10 mg total) by mouth daily. 096 tablet 3  . folic acid (FOLVITE) 1 MG tablet Take 1 mg by mouth daily.     [name] losartan (COZAAR) 50 MG tablet TAKE 1 TABLET(50 MG) BY MOUTH DAILY 90 tablet 3  . metFORMIN (GLUCOPHAGE) 1000 MG tablet Take 1,000 mg by mouth 2 (two) times daily with a meal.    . omeprazole (PRILOSEC) 40 MG capsule TAKE 1 CAPSULE(40 MG) BY MOUTH DAILY AS NEEDED FOR HEARTBURN 90 capsule 0  . ondansetron (ZOFRAN ODT) 8 MG disintegrating tablet '8mg'$  ODT q4 hours prn nausea 6 tablet 0  . pioglitazone (ACTOS) 15 MG tablet Take 15 mg by mouth daily.    . simvastatin (ZOCOR) 40 MG tablet Take 1 tablet (40 mg total) by mouth daily at 6 PM. 30 tablet 11  . traMADol (ULTRAM) 50 MG tablet TK 1 T PO Q 4 TO 6 H PRN P  0  . warfarin (COUMADIN) 3 MG tablet Take 6 mg by mouth every Wednesday.    . warfarin (COUMADIN) 5 MG tablet Take 5 mg by mouth as directed. Pt takes every day but on Wed's takes 6 mg    . furosemide (LASIX) 40 MG tablet TAKE 1 TABLET(40 MG) BY MOUTH DAILY AS NEEDED 90 tablet 6  . furosemide (LASIX) 40 MG tablet Take 40 mg daily may take extra 40 mg if needed 90 tablet 3   No facility-administered medications prior to visit.      Allergies:   Morphine and related   Social History   Socioeconomic History  . Marital status: Married    Spouse name: Not on file  . Number of children: Not on file  . Years of education: Not on file  . Highest education level: Not on file  Occupational History  . Not on file  Social Needs  . Financial resource strain: Not on file  . Food insecurity:    Worry: Not on file    Inability: Not on file  . Transportation needs:    Medical: Not on file    Non-medical: Not on file  Tobacco Use  . Smoking status: Never Smoker  . Smokeless tobacco: Never Used  Substance and Sexual Activity  . Alcohol use: No     Comment: none since 1979  . Drug use: No  . Sexual activity: Not on file  Lifestyle  . Physical activity:    Days per week: Not on file    Minutes per session: Not on file  . Stress: Not on file  Relationships  . Social connections:    Talks on phone: Not on file    Gets together: Not on file    Attends religious service: Not on file    Active member of club or organization:  Not on file    Attends meetings of clubs or organizations: Not on file    Relationship status: Not on file  Other Topics Concern  . Not on file  Social History Narrative  . Not on file     Family History:  The patient's family history includes Diabetes in his brother and mother; Hypertension in his brother.   ROS:   Please see the history of present illness.    ROS All other systems reviewed and are negative.   PHYSICAL EXAM:   VS:  BP (!) 142/76   Pulse 75   Ht '5\' 6"'$  (1.676 m)   Wt 255 lb 6.4 oz (115.8 kg)   BMI 41.22 kg/m    GEN: Well nourished, well developed, in no acute distress  HEENT: normal  Neck: no JVD, carotid bruits, or masses Cardiac: RRR; no murmurs, rubs, or gallops,no edema  Respiratory:  clear to auscultation bilaterally, normal work of breathing GI: soft, nontender, nondistended, + BS MS: no deformity or atrophy  Skin: warm and dry, no rash Neuro:  Alert and Oriented x 3, Strength and sensation are intact Psych: euthymic mood, full affect  Wt Readings from Last 3 Encounters:  11/01/18 255 lb 6.4 oz (115.8 kg)  08/21/18 251 lb 4.8 oz (114 kg)  07/17/18 250 lb (113.4 kg)      Studies/Labs Reviewed:   EKG:  EKG is ordered today.  The ekg ordered today demonstrates NSR with PACs  Recent Labs: 06/12/2018: ALT 20; Hemoglobin 12.5; Platelets 263 11/01/2018: BUN 15; Creatinine, Ser 0.82; Potassium 4.7; Sodium 137   Lipid Panel    Component Value Date/Time   CHOL  06/26/2007 1510    140        ATP III CLASSIFICATION:  <200     mg/dL   Desirable  200-239  mg/dL    Borderline High  >=240    mg/dL   High   TRIG 124 06/26/2007 1510   HDL 39 (L) 06/26/2007 1510   CHOLHDL 3.6 06/26/2007 1510   VLDL 25 06/26/2007 1510   LDLCALC  06/26/2007 1510    76        Total Cholesterol/HDL:CHD Risk Coronary Heart Disease Risk Table                     Men   Women  1/2 Average Risk   3.4   3.3    Additional studies/ records that were reviewed today include:   Myoview 05/30/2016 Study Highlights    Nuclear stress EF: 54%.  There was no ST segment deviation noted during stress.  Defect 1: There is a small defect of moderate severity present in the basal inferoseptal location.  Defect 2: There is a small defect of severe severity present in the apex location.  This is an intermediate risk study.  The left ventricular ejection fraction is mildly decreased (45-54%).   Intermediate risk stress nuclear study with mild ischemia in the basal inferoseptal wall and mild ischemia in the apex; EF 54 with normal wall motion; mild LVE.      Echo 03/07/2018 LV EF: 60% - 65% Study Conclusions  - Left ventricle: Wall thickness was increased in a pattern of moderate LVH. Systolic function was normal. The estimated ejection fraction was in the range of 60% to 65%. Wall motion was normal; there were no regional wall motion abnormalities. The study is not technically sufficient to allow evaluation of LV diastolic function. - Left atrium: The atrium  was normal in size. - Right atrium: The atrium was mildly dilated. - Tricuspid valve: There was mild regurgitation. - Pulmonary arteries: PA peak pressure: 32 mm Hg (S). - Inferior vena cava: The vessel was normal in size. The respirophasic diameter changes were in the normal range (>= 50%), consistent with normal central venous pressure.   ASSESSMENT:    1. Coronary artery disease involving coronary bypass graft of native heart without angina pectoris   2. Essential hypertension   3. Chronic  pulmonary embolism without acute cor pulmonale, unspecified pulmonary embolism type ([place])   4. Hyperlipidemia, unspecified hyperlipidemia type   5. Lower extremity edema      PLAN:  In order of problems listed above:  1. CAD s/p CABG: Continue aspirin, carvedilol and Zocor: Denies any chest pain.  2. Hypertension: Blood pressure well controlled on current therapy  3. Recurrent PE on chronic Coumadin therapy  4. Hyperlipidemia: On Zocor 40 mg daily.  Will defer annual lipid panel to primary care provider.  5. Lower extremity edema: Patient has been using 40 mg daily of Lasix since July.  I will obtain a basic metabolic panel.  Appears to be euvolemic on physical exam.    Medication Adjustments/Labs and Tests Ordered: Current medicines are reviewed at length with the patient today.  Concerns regarding medicines are outlined above.  Medication changes, Labs and Tests ordered today are listed in the Patient Instructions below. Patient Instructions  Medication Instructions:  Take Lasix 40 mg daily may take a extra 40 mg if needed If you need a refill on your cardiac medications before your next appointment, please call your pharmacy.   Lab work: Art gallery manager today If you have labs (blood work) drawn today and your tests are completely normal, you will receive your results only by: [name] MyChart Message (if you have MyChart) OR . A paper copy in the mail If you have any lab test that is abnormal or we need to change your treatment, we will call you to review the results.  Testing/Procedures: None ordered  Follow-Up: At [facility], you and your health needs are our priority.  As part of our continuing mission to provide you with exceptional heart care, we have created designated Provider Care Teams.  These Care Teams include your primary Cardiologist (physician) and Advanced Practice Providers (APPs -  Physician Assistants and Nurse Practitioners) who all work together to provide you with the  care you need, when you need it. . Follow Up with Dr.Berry in 6 months   Call 2 months before to schedule      Signed, Almyra Deforest, PA  11/04/2018 12:05 AM    [address], [place], [place]  [address] Phone: [phone]; Fax: [phone]

## 2018-11-02 LAB — BASIC METABOLIC PANEL
BUN/Creatinine Ratio: 18 (ref 10–24)
BUN: 15 mg/dL (ref 8–27)
CO2: 29 mmol/L (ref 20–29)
Calcium: 9.3 mg/dL (ref 8.6–10.2)
Chloride: 94 mmol/L — ABNORMAL LOW (ref 96–106)
Creatinine, Ser: 0.82 mg/dL (ref 0.76–1.27)
GFR calc Af Amer: 106 mL/min/{1.73_m2} (ref >59)
GFR calc non Af Amer: 92 mL/min/{1.73_m2} (ref >59)
Glucose: 90 mg/dL (ref 65–99)
Potassium: 4.7 mmol/L (ref 3.5–5.2)
Sodium: 137 mmol/L (ref 134–144)

## 2018-11-04 ENCOUNTER — Encounter: Payer: Self-pay | Admitting: Physician Assistant

## 2018-11-05 DIAGNOSIS — Z7901 Long term (current) use of anticoagulants: Secondary | ICD-10-CM | POA: Diagnosis not present

## 2018-11-07 DIAGNOSIS — M5411 Radiculopathy, occipito-atlanto-axial region: Secondary | ICD-10-CM | POA: Diagnosis not present

## 2018-11-07 DIAGNOSIS — M9901 Segmental and somatic dysfunction of cervical region: Secondary | ICD-10-CM | POA: Diagnosis not present

## 2018-11-07 DIAGNOSIS — M5416 Radiculopathy, lumbar region: Secondary | ICD-10-CM | POA: Diagnosis not present

## 2018-11-08 DIAGNOSIS — C44519 Basal cell carcinoma of skin of other part of trunk: Secondary | ICD-10-CM | POA: Diagnosis not present

## 2018-11-12 DIAGNOSIS — M5411 Radiculopathy, occipito-atlanto-axial region: Secondary | ICD-10-CM | POA: Diagnosis not present

## 2018-11-12 DIAGNOSIS — M9901 Segmental and somatic dysfunction of cervical region: Secondary | ICD-10-CM | POA: Diagnosis not present

## 2018-11-13 ENCOUNTER — Other Ambulatory Visit: Payer: Self-pay | Admitting: Physician Assistant

## 2018-11-13 DIAGNOSIS — M9901 Segmental and somatic dysfunction of cervical region: Secondary | ICD-10-CM | POA: Diagnosis not present

## 2018-11-13 DIAGNOSIS — M5416 Radiculopathy, lumbar region: Secondary | ICD-10-CM

## 2018-11-13 DIAGNOSIS — M5411 Radiculopathy, occipito-atlanto-axial region: Secondary | ICD-10-CM | POA: Diagnosis not present

## 2018-11-19 DIAGNOSIS — M9901 Segmental and somatic dysfunction of cervical region: Secondary | ICD-10-CM | POA: Diagnosis not present

## 2018-11-19 DIAGNOSIS — M5411 Radiculopathy, occipito-atlanto-axial region: Secondary | ICD-10-CM | POA: Diagnosis not present

## 2018-11-25 ENCOUNTER — Ambulatory Visit
Admission: RE | Admit: 2018-11-25 | Discharge: 2018-11-25 | Disposition: A | Discharge status: Home / Self Care | Payer: Medicare Other | Source: Ambulatory Visit | Attending: Physician Assistant | Admitting: Physician Assistant

## 2018-11-25 DIAGNOSIS — M5116 Intervertebral disc disorders with radiculopathy, lumbar region: Secondary | ICD-10-CM | POA: Diagnosis not present

## 2018-11-25 DIAGNOSIS — M5416 Radiculopathy, lumbar region: Secondary | ICD-10-CM

## 2018-11-25 MED ORDER — GADOBENATE DIMEGLUMINE 529 MG/ML IV SOLN
20.0000 mL | Freq: Once | INTRAVENOUS | Status: AC | PRN
  Administered 2018-11-25: 20 mL via INTRAVENOUS

## 2018-11-26 DIAGNOSIS — M9901 Segmental and somatic dysfunction of cervical region: Secondary | ICD-10-CM | POA: Diagnosis not present

## 2018-11-26 DIAGNOSIS — M5411 Radiculopathy, occipito-atlanto-axial region: Secondary | ICD-10-CM | POA: Diagnosis not present

## 2018-11-26 DIAGNOSIS — Z7901 Long term (current) use of anticoagulants: Secondary | ICD-10-CM | POA: Diagnosis not present

## 2018-12-03 DIAGNOSIS — M5126 Other intervertebral disc displacement, lumbar region: Secondary | ICD-10-CM | POA: Diagnosis not present

## 2018-12-03 DIAGNOSIS — R030 Elevated blood-pressure reading, without diagnosis of hypertension: Secondary | ICD-10-CM | POA: Diagnosis not present

## 2018-12-03 DIAGNOSIS — Z6841 Body Mass Index (BMI) 40.0 and over, adult: Secondary | ICD-10-CM | POA: Diagnosis not present

## 2018-12-06 DIAGNOSIS — M9901 Segmental and somatic dysfunction of cervical region: Secondary | ICD-10-CM | POA: Diagnosis not present

## 2018-12-06 DIAGNOSIS — M5411 Radiculopathy, occipito-atlanto-axial region: Secondary | ICD-10-CM | POA: Diagnosis not present

## 2018-12-07 DIAGNOSIS — Z7901 Long term (current) use of anticoagulants: Secondary | ICD-10-CM | POA: Diagnosis not present

## 2018-12-20 ENCOUNTER — Telehealth: Payer: Self-pay | Admitting: Cardiovascular Disease

## 2018-12-20 NOTE — Telephone Encounter (Signed)
  Patient is interested in taking Phentermine and wants to know if it would be safe for him to take.

## 2018-12-20 NOTE — Telephone Encounter (Signed)
PATIENT AWARE WILL DEFER TO PHARMACIST AND DR BERRY , ONCE A RESPONSE IS GIVEN WILL CONTACT PATIENT

## 2018-12-20 NOTE — Telephone Encounter (Signed)
Phentermine is a stimulant and is contradicted in patients with known cardiac disease.  Would not consider this medication safe for him.

## 2018-12-20 NOTE — Telephone Encounter (Signed)
Spoke to patient . Patient aware of  Recommendation to use mediation . Patient wanted to know what medication could he use.]  RN INFORMED patient ono diet  Medication are good for  cardiac patient -   - modify  diet intake and exercising Patient verbalized understanding.

## 2018-12-21 DIAGNOSIS — Z86718 Personal history of other venous thrombosis and embolism: Secondary | ICD-10-CM | POA: Diagnosis not present

## 2018-12-21 DIAGNOSIS — Z7901 Long term (current) use of anticoagulants: Secondary | ICD-10-CM | POA: Diagnosis not present

## 2018-12-24 DIAGNOSIS — M9901 Segmental and somatic dysfunction of cervical region: Secondary | ICD-10-CM | POA: Diagnosis not present

## 2018-12-24 DIAGNOSIS — M5411 Radiculopathy, occipito-atlanto-axial region: Secondary | ICD-10-CM | POA: Diagnosis not present

## 2018-12-26 DIAGNOSIS — H2513 Age-related nuclear cataract, bilateral: Secondary | ICD-10-CM | POA: Diagnosis not present

## 2018-12-26 DIAGNOSIS — H5203 Hypermetropia, bilateral: Secondary | ICD-10-CM | POA: Diagnosis not present

## 2018-12-26 DIAGNOSIS — E119 Type 2 diabetes mellitus without complications: Secondary | ICD-10-CM | POA: Diagnosis not present

## 2019-01-07 DIAGNOSIS — M5411 Radiculopathy, occipito-atlanto-axial region: Secondary | ICD-10-CM | POA: Diagnosis not present

## 2019-01-07 DIAGNOSIS — M9901 Segmental and somatic dysfunction of cervical region: Secondary | ICD-10-CM | POA: Diagnosis not present

## 2019-01-16 DIAGNOSIS — Z7901 Long term (current) use of anticoagulants: Secondary | ICD-10-CM | POA: Diagnosis not present

## 2019-01-16 DIAGNOSIS — I2510 Atherosclerotic heart disease of native coronary artery without angina pectoris: Secondary | ICD-10-CM | POA: Diagnosis not present

## 2019-02-06 ENCOUNTER — Other Ambulatory Visit: Payer: Self-pay | Admitting: Cardiovascular Disease

## 2019-02-15 DIAGNOSIS — Z7901 Long term (current) use of anticoagulants: Secondary | ICD-10-CM | POA: Diagnosis not present

## 2019-02-15 DIAGNOSIS — I2510 Atherosclerotic heart disease of native coronary artery without angina pectoris: Secondary | ICD-10-CM | POA: Diagnosis not present

## 2019-03-15 DIAGNOSIS — Z86718 Personal history of other venous thrombosis and embolism: Secondary | ICD-10-CM | POA: Diagnosis not present

## 2019-03-15 DIAGNOSIS — Z7901 Long term (current) use of anticoagulants: Secondary | ICD-10-CM | POA: Diagnosis not present

## 2019-04-08 ENCOUNTER — Other Ambulatory Visit: Payer: Self-pay

## 2019-04-08 ENCOUNTER — Telehealth: Payer: Self-pay | Admitting: Cardiovascular Disease

## 2019-04-08 ENCOUNTER — Other Ambulatory Visit (INDEPENDENT_AMBULATORY_CARE_PROVIDER_SITE_OTHER): Payer: Medicare Other

## 2019-04-08 VITALS — BP 114/71 | HR 75 | Ht 65.0 in | Wt 254.2 lb

## 2019-04-08 DIAGNOSIS — I2510 Atherosclerotic heart disease of native coronary artery without angina pectoris: Secondary | ICD-10-CM

## 2019-04-08 DIAGNOSIS — R600 Localized edema: Secondary | ICD-10-CM

## 2019-04-08 DIAGNOSIS — R000 Tachycardia, unspecified: Secondary | ICD-10-CM

## 2019-04-08 DIAGNOSIS — R0600 Dyspnea, unspecified: Secondary | ICD-10-CM

## 2019-04-08 DIAGNOSIS — R0609 Other forms of dyspnea: Secondary | ICD-10-CM

## 2019-04-08 NOTE — Telephone Encounter (Signed)
Alisha, thank you for scheduling the EKG visit. Can you also please schedule the virtual visit this week?

## 2019-04-08 NOTE — Telephone Encounter (Signed)
Spoke with pt who state since about Feb, he has been experiencing SOB. He report, he's noticed when he walk to the mailbox and back, he's more SOB. He also report, he use to be able to mow his whole yard in about an hour and now it takes him about 45 minutes to mow half due to having to stop. Pt states he doesn't weigh himself daily but can usually tell in his clothes and feels his abdomen is distended and report swelling in face and legs. Pt report taking extra lasix on Saturday and Sunday but doesn't feel it's helped.   Will route call to triage PA for recommendations.

## 2019-04-08 NOTE — Telephone Encounter (Signed)
° ° °  Pt c/o Shortness Of Breath: STAT if SOB developed within the last 24 hours or pt is noticeably SOB on the phone  1. Are you currently SOB (can you hear that pt is SOB on the phone)? NO 2. How long have you been experiencing SOB? 3 MONTHS 3. Are you SOB when sitting or when up moving around? MOVING AROUND AND LAYING DOWN  4. Are you currently experiencing any other symptoms? WEAKNESS   Pt c/o swelling: STAT is pt has developed SOB within 24 hours  1) How much weight have you gained and in what time span? N/A  2) If swelling, where is the swelling located? FETT  3) Are you currently taking a fluid pill? YES  4) Are you currently SOB? AT TIMES  5) Do you have a log of your daily weights (if so, list)? NO  6) Have you gained 3 pounds in a day or 5 pounds in a week? N/A  7) Have you traveled recently? NO

## 2019-04-08 NOTE — Telephone Encounter (Signed)
Message received from triage.  I returned the patient call.  He is reporting worsening dyspnea on exertion, orthopnea, and abdominal girth the end of February/early March.  He also reports tachycardia with activity.  He has taken 40 mg Lasix twice daily on Saturday and Sunday.  He states that he has voided more but does not feel an improvement in his swelling or shortness of breath.  He requested an office visit with Dr. Alvester Chou.  I described our current situation with virtual visit.  He is agreeable to a virtual visit, but would still like an EKG.  I do not think this is unreasonable.  I have advised him to continue 40 mg twice daily of Lasix for 2 more days.  He will report to the office this afternoon for lab work to include basic metabolic panel, BNP, and magnesium.  I will also ask nursing to obtain an EKG.  He is aware that he will not see a provider today.  Triage, please arrange for a virtual visit with Dr. Gwenlyn Found this week.  If he cannot see Dr. Gwenlyn Found, please arrange with Almyra Deforest PAC.  VIDEO visit is preferred.   Tami Lin Erinn Mendosa, PA-C 04/08/2019, 9:56 AM

## 2019-04-08 NOTE — Telephone Encounter (Signed)
Nurse visit scheduled for today 5/11 at 1:40 pm. Pt made aware.

## 2019-04-08 NOTE — Telephone Encounter (Signed)
Appointment scheduled for 5/14 at 3 pm with Dr. Gwenlyn Found.

## 2019-04-08 NOTE — Patient Instructions (Signed)
Medication Instructions:  Your physician recommends that you continue on your current medications as directed except for your furosemide (Lasix) 40 mg which you should continue to take twice a day until your visit with Dr. Gwenlyn Found on 04/11/2019. If you need a refill on your cardiac medications before your next appointment, please call your pharmacy.   Lab work: Your physician recommends that you return for lab work today: BMP, BNP, Magnesium  If you have labs (blood work) drawn today and your tests are completely normal, you will receive your results only by: [name] MyChart Message (if you have MyChart) OR . A paper copy in the mail If you have any lab test that is abnormal or we need to change your treatment, we will call you to review the results.  Testing/Procedures: NONE  Follow-Up: At [facility], you and your health needs are our priority.  As part of our continuing mission to provide you with exceptional heart care, we have created designated Provider Care Teams.  These Care Teams include your primary Cardiologist (physician) and Advanced Practice Providers (APPs -  Physician Assistants and Nurse Practitioners) who all work together to provide you with the care you need, when you need it. You will need a follow up appointment on 04/11/2019 with Dr.Berry.

## 2019-04-09 LAB — BRAIN NATRIURETIC PEPTIDE: BNP: 58.6 pg/mL (ref 0.0–100.0)

## 2019-04-09 LAB — BASIC METABOLIC PANEL
BUN/Creatinine Ratio: 18 (ref 10–24)
BUN: 15 mg/dL (ref 8–27)
CO2: 28 mmol/L (ref 20–29)
Calcium: 9.2 mg/dL (ref 8.6–10.2)
Chloride: 97 mmol/L (ref 96–106)
Creatinine, Ser: 0.82 mg/dL (ref 0.76–1.27)
GFR calc Af Amer: 105 mL/min/{1.73_m2} (ref >59)
GFR calc non Af Amer: 91 mL/min/{1.73_m2} (ref >59)
Glucose: 93 mg/dL (ref 65–99)
Potassium: 4.2 mmol/L (ref 3.5–5.2)
Sodium: 139 mmol/L (ref 134–144)

## 2019-04-09 LAB — MAGNESIUM: Magnesium: 1.9 mg/dL (ref 1.6–2.3)

## 2019-04-10 ENCOUNTER — Telehealth: Payer: Self-pay | Admitting: Cardiovascular Disease

## 2019-04-10 NOTE — Telephone Encounter (Signed)
Mychart pending, smartphone, consent (verbal) pre reg complete 04/10/19 AF

## 2019-04-11 ENCOUNTER — Telehealth (INDEPENDENT_AMBULATORY_CARE_PROVIDER_SITE_OTHER): Payer: Medicare Other | Admitting: Cardiovascular Disease

## 2019-04-11 ENCOUNTER — Telehealth: Payer: Self-pay | Admitting: Cardiovascular Disease

## 2019-04-11 DIAGNOSIS — I25810 Atherosclerosis of coronary artery bypass graft(s) without angina pectoris: Secondary | ICD-10-CM | POA: Diagnosis not present

## 2019-04-11 DIAGNOSIS — E782 Mixed hyperlipidemia: Secondary | ICD-10-CM | POA: Diagnosis not present

## 2019-04-11 DIAGNOSIS — R760 Raised antibody titer: Secondary | ICD-10-CM | POA: Diagnosis not present

## 2019-04-11 DIAGNOSIS — I10 Essential (primary) hypertension: Secondary | ICD-10-CM

## 2019-04-11 MED ORDER — FUROSEMIDE 80 MG PO TABS: 80.0000 mg | ORAL_TABLET | Freq: Every day | ORAL | 3 refills | Status: DC

## 2019-04-11 NOTE — Telephone Encounter (Signed)
Spoke with the pt. Pt sts that Dr.Berry increased his Lasix to 80mg  daily during his visit with him today. Pt sts that his daughter is a Therapist, sports and adv him to call back to see if Dr.Berry is going to prescribe Potassium as well. Adv the pt that I do not see where potassium has been prescribed. I will fwd a message to [facility] for clarification and we will give him a call back with Dr.Berry's recommendation. Pt verbalized understanding and voiced appreciation for the call.

## 2019-04-11 NOTE — Progress Notes (Signed)
Virtual Visit via Video Note   This visit type was conducted due to national recommendations for restrictions regarding the COVID-19 Pandemic (e.g. social distancing) in an effort to limit this patient's exposure and mitigate transmission in our community.  Due to his co-morbid illnesses, this patient is at least at moderate risk for complications without adequate follow up.  This format is felt to be most appropriate for this patient at this time.  All issues noted in this document were discussed and addressed.  A limited physical exam was performed with this format.  Please refer to the patient's chart for his consent to telehealth for [facility].   Date:  04/11/2019   ID:  Eduardo Butler, DOB 21-Jun-1951, MRN [id number]  Patient Location: Home Provider Location: Home  PCP:  Orpah Melter, MD  Cardiologist:  Quay Burow, MD  Electrophysiologist:  None   Evaluation Performed:  Follow-Up Visit  Chief Complaint: Dyspnea on exertion  History of Present Illness:    Eduardo Butler is a 68 y.o.  moderately overweight married Caucasian male, father of 2, grandfather to 2 grandchildren who I last saw in the  06/05/2018.  He is accompanied by his wife [name] today.  He has a history of CAD status post coronary artery bypass grafting x4 in March of 2003. 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 [name] follows. His other problems include hyperlipidemia and erectile dysfunction. [facility] catheterized him at [facility] August 17, 2006 revealing patent grafts with normal LV function. His last Myoview performed 01/04/13 was nonischemic Dr. Doyle Askew follows his lipid profile. When I saw him 2 months ago he was complaining of dyspnea on exertion. He saw one of our PAs a month later with increasing dyspnea on exertion. His diastolic was cut in half but remains bradycardic although he was clinically improved. His  when necessary hydrochlorothiazide was changed to when necessary Lasix which she took over the weekend for several doses which resulted in improved dyspnea and decreased edema. I did discuss with him today so restriction. A 2-D echo was ordered that showed normal FUNCTION in a Myoview stress test that showed apical thinning with new ischemia in the inferior wall compared to a previous Myoview 2 years ago. Since I saw him3 months ago he continued to do well.  He says he is stronger.  I did adjust his diuretics which improved his lower extremity edema.  He also admits to dietary indiscretion.  He denies chest pain does have chronic dyspnea on exertion.  Since I saw him a year ago he has seen how Manning PA-C in the office 11/04/2018.  His major complaint is of dyspnea.  He has not lost any weight.  He denies chest pain.  An echo performed 03/07/2018 was completely normal  The patient does not have symptoms concerning for COVID-19 infection (fever, chills, cough, or new shortness of breath).    Past Medical History:  Diagnosis Date  . Anxiety   . Arthritis   . CHF (congestive heart failure) ([place])   . Coronary artery disease   . Diabetes ([place])   . H/O cardiac catheterization 08/25/2006   normal cath-patent grafts  . H/O Doppler ultrasound 02/21/2011   lower ext.venous doppler,, no treatment except support stockings are recommended if clinically indicated  . H/O echocardiogram 12/02/2010   EF 55% , no significant abnormalities  . History of stress test 01/04/2013   Lexiscan Myoview ;  scattered PVC's ; LV EF 54% ; Normal stress test   . Hyperlipidemia   . Hypertension   . Obesity   . Peroneal DVT (deep venous thrombosis)   . Shortness of breath    Past Surgical History:  Procedure Laterality Date  . BYPASS GRAFT  2003   4 bypass   . CARDIAC CATHETERIZATION  2002-02-16   S/P sudden cardiac death; three vessel coronary artery disease; preserved overall left ventricular systolic function with  loculated mild to moderate anterolateral hypokinesis; no mitral regurgitation noted  . CORONARY ARTERY BYPASS GRAFT  2003  . HERNIA REPAIR    . LUMBAR LAMINECTOMY/ DECOMPRESSION WITH MET-RX Left 07/08/2014   Procedure: Left Lumbar Four-Five Metrex foraminal microdiskectomy;  Surgeon: Consuella Lose, MD;  Location: [facility];  Service: Neurosurgery;  Laterality: Left;  Left Lumbar Four-Five Metrex foraminal microdiskectomy  . PELVIC LAPAROSCOPY  2005  . SHOULDER SURGERY    . UMBILICAL HERNIA REPAIR  11/17/2011   Procedure: HERNIA REPAIR UMBILICAL ADULT;  Surgeon: Harl Bowie, MD;  Location: [facility];  Service: General;  Laterality: N/A;  umbilical hernia repair with mesh     No outpatient medications have been marked as taking for the 04/11/19 encounter (Appointment) with Lorretta Harp, MD.     Allergies:   Morphine and related   Social History   Tobacco Use  . Smoking status: Never Smoker  . Smokeless tobacco: Never Used  Substance Use Topics  . Alcohol use: No    Comment: none since 1979  . Drug use: No     Family Hx: The patient's family history includes Diabetes in his brother and mother; Hypertension in his brother. There is no history of Heart attack or Stroke.  ROS:   Please see the history of present illness.     All other systems reviewed and are negative.   Prior CV studies:   The following studies were reviewed today:  None  Labs/Other Tests and Data Reviewed:    EKG:  An ECG dated 04/08/2019 was personally reviewed today and demonstrated:  Sinus rhythm with PACs  Recent Labs: 06/12/2018: ALT 20; Hemoglobin 12.5; Platelets 263 04/08/2019: BNP 58.6; BUN 15; Creatinine, Ser 0.82; Magnesium 1.9; Potassium 4.2; Sodium 139   Recent Lipid Panel Lab Results  Component Value Date/Time   CHOL  06/26/2007 03:10 PM    140        ATP III CLASSIFICATION:  <200     mg/dL   Desirable  200-239  mg/dL   Borderline High  >=240    mg/dL    High   TRIG 124 06/26/2007 03:10 PM   HDL 39 (L) 06/26/2007 03:10 PM   CHOLHDL 3.6 06/26/2007 03:10 PM   LDLCALC  06/26/2007 03:10 PM    76        Total Cholesterol/HDL:CHD Risk Coronary Heart Disease Risk Table                     Men   Women  1/2 Average Risk   3.4   3.3    Wt Readings from Last 3 Encounters:  04/08/19 254 lb 3.2 oz (115.3 kg)  11/01/18 255 lb 6.4 oz (115.8 kg)  08/21/18 251 lb 4.8 oz (114 kg)     Objective:    Vital Signs:  There were no vitals taken for this visit.   VITAL SIGNS:  reviewed GEN:  no acute distress RESPIRATORY:  normal respiratory effort, symmetric expansion NEURO:  alert and oriented x 3, no obvious focal deficit PSYCH:  normal affect  ASSESSMENT & PLAN:    1. Coronary artery disease- history of CAD status post CABG times 29 January 2002.  I performed cardiac catheterization on him 08/17/2006 revealing patent grafts and normal LV function.  His most recent stress test performed 05/30/2016 did show mild lateral ischemia.  He denies chest pain but does complain of dyspnea which is a chronic complaint. 2. Essential hypertension- history of essential hypertension with blood pressure measured by the patient at home of 121/76 with a pulse of 90.  He is on carvedilol and losartan 3. Hyperlipidemia- history of hyperlipidemia on simvastatin and Zetia with lipid profile performed 01/30/2018 revealing total cholesterol 128, LDL 63 and HDL 41  COVID-19 Education: The signs and symptoms of COVID-19 were discussed with the patient and how to seek care for testing (follow up with PCP or arrange E-visit).  The importance of social distancing was discussed today.  Time:   Today, I have spent 16 minutes with the patient with telehealth technology discussing the above problems.     Medication Adjustments/Labs and Tests Ordered: Current medicines are reviewed at length with the patient today.  Concerns regarding medicines are outlined above.   Tests Ordered: No  orders of the defined types were placed in this encounter.   Medication Changes: No orders of the defined types were placed in this encounter.   Disposition:  Follow up in 3 month(s)  Signed, Quay Burow, MD  04/11/2019 12:26 PM    [place]

## 2019-04-11 NOTE — Telephone Encounter (Signed)
Patient called stating that he had phone visit with Dr. Gwenlyn Found today. He is calling [facility] asking to speak with someone regarding his medications.    [phone])

## 2019-04-11 NOTE — Patient Instructions (Signed)
Medication Instructions:   Your physician has recommended you make the following change in your medication:   Increase furosemide to 80 mg daily. You may take 2 of your 40 mg tablets daily until they are finished  Continue all other medications the same  Labwork:  NONE  Testing/Procedures:  NONE  Follow-Up:  Your physician recommends that you schedule a follow-up appointment in: 1 year with Dr. Gwenlyn Found. You will receive a reminder letter in the mail in about 10 months reminding you to call and schedule your appointment. If you don't receive this letter, please contact our office.  Your physician recommends that you schedule a follow-up appointment in: 3 months with Almyra Deforest. You will receive a reminder letter in the mail in about 1-2 months reminding you to call and schedule your appointment. If you don't receive this letter, please contact our office.  Any Other Special Instructions Will Be Listed Below (If Applicable).  If you need a refill on your cardiac medications before your next appointment, please call your pharmacy.

## 2019-04-12 ENCOUNTER — Other Ambulatory Visit: Payer: Self-pay

## 2019-04-12 DIAGNOSIS — Z79899 Other long term (current) drug therapy: Secondary | ICD-10-CM

## 2019-04-12 NOTE — Telephone Encounter (Signed)
Pt aware that needs to have BMP in 7-10 days. Pt requested that order requisition be sent to his PCP office so that he can have labs done there. Informed pt that order requisition sent via Epic fax to PCP office and if they accept the order for his BMP he can have labs done there. Pt verbalized understanding.   Pt requested that nurse ask Dr. Gwenlyn Found about consumption of diet sodas. Dr. Gwenlyn Found okay with nurse giving pt recommendations from [facility] regarding consumption of diet sodas. Pt given recommendations and verbalized understanding

## 2019-04-12 NOTE — Telephone Encounter (Signed)
Please get a basic metabolic panel in 7 to 10 days to assess his potassium level and need for potassium repletion

## 2019-04-17 ENCOUNTER — Other Ambulatory Visit: Payer: Self-pay | Admitting: Cardiovascular Disease

## 2019-04-17 DIAGNOSIS — Z79899 Other long term (current) drug therapy: Secondary | ICD-10-CM | POA: Diagnosis not present

## 2019-04-18 LAB — BASIC METABOLIC PANEL
BUN/Creatinine Ratio: 20 (ref 10–24)
BUN: 15 mg/dL (ref 8–27)
CO2: 28 mmol/L (ref 20–29)
Calcium: 8.7 mg/dL (ref 8.6–10.2)
Chloride: 97 mmol/L (ref 96–106)
Creatinine, Ser: 0.76 mg/dL (ref 0.76–1.27)
GFR calc Af Amer: 108 mL/min/{1.73_m2} (ref >59)
GFR calc non Af Amer: 94 mL/min/{1.73_m2} (ref >59)
Glucose: 132 mg/dL — ABNORMAL HIGH (ref 65–99)
Potassium: 4.3 mmol/L (ref 3.5–5.2)
Sodium: 139 mmol/L (ref 134–144)

## 2019-04-23 ENCOUNTER — Telehealth: Payer: Self-pay | Admitting: Cardiovascular Disease

## 2019-04-23 NOTE — Telephone Encounter (Signed)
On 04/17/2019 his potassium and renal function were normal.  Continue Lasix at current dose.  No need to check another basic metabolic panel given his recent lab work.

## 2019-04-23 NOTE — Telephone Encounter (Signed)
New message   Patient would like the test results of lab work done on May 21st, 2020 at [place] [facility]. Please call.

## 2019-04-23 NOTE — Telephone Encounter (Signed)
Pt advised his 04/17/19 lab result but he is asking how long will he be on the 80mg  lasix and when will he have repeat labs for his K.. he is going on week 3 of the Lasix 80mg ... according to the AVS from his 04/12/19 visit... it says he can increase to 2 of the Lasix 40mg  until he is done but an 80mg  RX was sent in so he is not sure how long he should be on the 80mg ..   Pt says he is physically doing well.   His daughter is a Marine scientist and even though his K was good on his last BMET she is anxious it will change eventually being on a high dose Lasix.[name] pt not set to f/u until 06/2019.

## 2019-04-25 NOTE — Telephone Encounter (Signed)
Contacted pt and reviewed the following info with pt: "On 04/17/2019 his potassium and renal function were normal.  Continue Lasix at current dose.  No need to check another basic metabolic panel given his recent lab work." Also discussed low potassium symptoms per pt request. Pt states he is to f/u in 3 mos. Encouraged pt to call in to office between now and then if has questions/concerns. Pt verbalized understanding

## 2019-05-06 DIAGNOSIS — Z7901 Long term (current) use of anticoagulants: Secondary | ICD-10-CM | POA: Diagnosis not present

## 2019-05-19 ENCOUNTER — Other Ambulatory Visit: Payer: Self-pay | Admitting: Cardiovascular Disease

## 2019-06-07 DIAGNOSIS — Z7901 Long term (current) use of anticoagulants: Secondary | ICD-10-CM | POA: Diagnosis not present

## 2019-06-07 DIAGNOSIS — Z86718 Personal history of other venous thrombosis and embolism: Secondary | ICD-10-CM | POA: Diagnosis not present

## 2019-07-05 ENCOUNTER — Other Ambulatory Visit: Payer: Self-pay | Admitting: Cardiovascular Disease

## 2019-07-15 DIAGNOSIS — Z7901 Long term (current) use of anticoagulants: Secondary | ICD-10-CM | POA: Diagnosis not present

## 2019-07-17 DIAGNOSIS — F419 Anxiety disorder, unspecified: Secondary | ICD-10-CM | POA: Diagnosis not present

## 2019-07-17 DIAGNOSIS — L03019 Cellulitis of unspecified finger: Secondary | ICD-10-CM | POA: Diagnosis not present

## 2019-07-17 DIAGNOSIS — Z7901 Long term (current) use of anticoagulants: Secondary | ICD-10-CM | POA: Diagnosis not present

## 2019-08-12 ENCOUNTER — Ambulatory Visit (INDEPENDENT_AMBULATORY_CARE_PROVIDER_SITE_OTHER): Payer: Medicare Other | Admitting: Physician Assistant

## 2019-08-12 ENCOUNTER — Encounter: Payer: Self-pay | Admitting: Physician Assistant

## 2019-08-12 ENCOUNTER — Other Ambulatory Visit: Payer: Self-pay

## 2019-08-12 VITALS — BP 104/60 | HR 73 | Ht 66.0 in | Wt 257.8 lb

## 2019-08-12 DIAGNOSIS — I2782 Chronic pulmonary embolism: Secondary | ICD-10-CM

## 2019-08-12 DIAGNOSIS — Z7901 Long term (current) use of anticoagulants: Secondary | ICD-10-CM | POA: Diagnosis not present

## 2019-08-12 DIAGNOSIS — I25709 Atherosclerosis of coronary artery bypass graft(s), unspecified, with unspecified angina pectoris: Secondary | ICD-10-CM

## 2019-08-12 DIAGNOSIS — I2510 Atherosclerotic heart disease of native coronary artery without angina pectoris: Secondary | ICD-10-CM | POA: Diagnosis not present

## 2019-08-12 DIAGNOSIS — I25810 Atherosclerosis of coronary artery bypass graft(s) without angina pectoris: Secondary | ICD-10-CM

## 2019-08-12 DIAGNOSIS — E782 Mixed hyperlipidemia: Secondary | ICD-10-CM | POA: Diagnosis not present

## 2019-08-12 DIAGNOSIS — I5032 Chronic diastolic (congestive) heart failure: Secondary | ICD-10-CM | POA: Diagnosis not present

## 2019-08-12 DIAGNOSIS — H903 Sensorineural hearing loss, bilateral: Secondary | ICD-10-CM | POA: Diagnosis not present

## 2019-08-12 NOTE — Progress Notes (Signed)
Cardiology Office Note    Date:  08/13/2019   ID:  Garan, Frappier [birth year], MRN [id number]  PCP:  Orpah Melter, MD  Cardiologist:  Dr. Gwenlyn Found   Chief Complaint  Patient presents with  . Follow-up    seen for Dr. Gwenlyn Found.    History of Present Illness:  Eduardo Butler is a 68 y.o. male with PMH of CAD s/p CABG x 4(sequential SVG to acute marginal and distal RCA, free radial to LCx/OM, LIMA to LAD)01/2002, recurrent pulmonary emboli secondary to lupus anticoagulant on lifelong Coumadin, hyperlipidemia and erectile dysfunction. He had a cardiac catheterization on 08/17/2006 revealing patent grafts with normal LV function. Myoview in February 2014 was nonischemic. He was evaluated for worsening dyspnea in July 2017 with myoview, this revealed EF 54%, small defect of moderate severity present in the basal inferoseptal location, small defect of severe severity present in the apex location consistent with very mild ischemia, overall intermediate risk study. Echocardiogram obtained on 03/07/2018 showed EF 60 to 65%, moderate LVH, mild TR, PA peak pressure 32 mmHg.  He was last seen via virtual visit by Dr. Gwenlyn Found on 04/11/2019, he was doing well at the time without significant chest pain or shortness of breath.  He presents today for cardiology office visit.  He occasionally have crescent-shaped area on the left chest that hurts for about 30 seconds.  It only occurs about once a week and does not occur with exertion.  He actually feels better when he exercises.  The chest pain occurs randomly and at rest.  I recommend continued observation.  His lower extremity edema is well controlled on the current dose of the diuretic.  I recommend a CMP and fasting lipid panel.  The last lipid panel was obtained in March 2019 which was very well controlled.  He is planning to go for vacation in [place] next week however will return in a week.  After he returned he can get the lab work.  I asked him to be  careful with signs of dehydration while vacationing.   Past Medical History:  Diagnosis Date  . Anxiety   . Arthritis   . CHF (congestive heart failure) ([place])   . Coronary artery disease   . Diabetes ([place])   . H/O cardiac catheterization 08/25/2006   normal cath-patent grafts  . H/O Doppler ultrasound 02/21/2011   lower ext.venous doppler,, no treatment except support stockings are recommended if clinically indicated  . H/O echocardiogram 12/02/2010   EF 55% , no significant abnormalities  . History of stress test 01/04/2013   Lexiscan Myoview ; scattered PVC's ; LV EF 54% ; Normal stress test   . Hyperlipidemia   . Hypertension   . Obesity   . Peroneal DVT (deep venous thrombosis)   . Shortness of breath     Past Surgical History:  Procedure Laterality Date  . BYPASS GRAFT  2003   4 bypass   . CARDIAC CATHETERIZATION  02/10/2002   S/P sudden cardiac death; three vessel coronary artery disease; preserved overall left ventricular systolic function with loculated mild to moderate anterolateral hypokinesis; no mitral regurgitation noted  . CORONARY ARTERY BYPASS GRAFT  2003  . HERNIA REPAIR    . LUMBAR LAMINECTOMY/ DECOMPRESSION WITH MET-RX Left 07/08/2014   Procedure: Left Lumbar Four-Five Metrex foraminal microdiskectomy;  Surgeon: Consuella Lose, MD;  Location: [facility];  Service: Neurosurgery;  Laterality: Left;  Left Lumbar Four-Five Metrex foraminal microdiskectomy  . PELVIC LAPAROSCOPY  2005  .  SHOULDER SURGERY    . UMBILICAL HERNIA REPAIR  11/17/2011   Procedure: HERNIA REPAIR UMBILICAL ADULT;  Surgeon: Harl Bowie, MD;  Location: [facility];  Service: General;  Laterality: N/A;  umbilical hernia repair with mesh    Current Medications: Outpatient Medications Prior to Visit  Medication Sig Dispense Refill  . acetaminophen (TYLENOL) 500 MG tablet Take 1,000-1,500 mg by mouth every 6 (six) hours as needed for moderate pain.    [name] ALPRAZolam  (XANAX) 0.5 MG tablet Take 0.5 mg by mouth as needed for anxiety or sleep.    . Ascorbic Acid (VITAMIN C) 1000 MG tablet Take 1,000 mg by mouth daily.    [name] aspirin EC 81 MG tablet Take 81 mg by mouth daily.    . carvedilol (COREG) 3.125 MG tablet TAKE 1 TABLET(3.125 MG) BY MOUTH TWICE DAILY 180 tablet 3  . CINNAMON PO Take 2,000 mg by mouth daily.    Water engineer Bandages & Supports (MEDICAL COMPRESSION STOCKINGS) MISC Knee High 20-30 mm compression stockings 2 each 0  . ezetimibe (ZETIA) 10 MG tablet Take 1 tablet (10 mg total) by mouth daily. 856 tablet 3  . folic acid (FOLVITE) 1 MG tablet Take 1 mg by mouth daily.     [name] losartan (COZAAR) 50 MG tablet TAKE 1 TABLET(50 MG) BY MOUTH DAILY 90 tablet 3  . metFORMIN (GLUCOPHAGE) 1000 MG tablet Take 1,000 mg by mouth 2 (two) times daily with a meal.    . omeprazole (PRILOSEC) 40 MG capsule TAKE 1 CAPSULE(40 MG) BY MOUTH DAILY AS NEEDED FOR HEARTBURN 90 capsule 1  . pioglitazone (ACTOS) 15 MG tablet Take 15 mg by mouth daily.    . simvastatin (ZOCOR) 40 MG tablet Take 1 tablet (40 mg total) by mouth daily at 6 PM. 30 tablet 11  . traMADol (ULTRAM) 50 MG tablet TK 1 T PO Q 4 TO 6 H PRN P  0  . warfarin (COUMADIN) 4 MG tablet TK 1 T PO ONCE A WEEK ON WEDNESDAYS ONLY    . furosemide (LASIX) 80 MG tablet Take 1 tablet (80 mg total) by mouth daily. 90 tablet 3  . dexlansoprazole (DEXILANT) 60 MG capsule Take 60 mg by mouth as needed.     . ondansetron (ZOFRAN ODT) 8 MG disintegrating tablet 68m ODT q4 hours prn nausea 6 tablet 0  . tadalafil (CIALIS) 5 MG tablet Take 5 mg by mouth as needed.     . warfarin (COUMADIN) 3 MG tablet Take 6 mg by mouth every Wednesday.    . warfarin (COUMADIN) 5 MG tablet Take 5 mg by mouth as directed. Pt takes every day but on Wed's takes 6 mg     No facility-administered medications prior to visit.      Allergies:   Morphine and related   Social History   Socioeconomic History  . Marital status: Married    Spouse  name: Not on file  . Number of children: Not on file  . Years of education: Not on file  . Highest education level: Not on file  Occupational History  . Not on file  Social Needs  . Financial resource strain: Not on file  . Food insecurity    Worry: Not on file    Inability: Not on file  . Transportation needs    Medical: Not on file    Non-medical: Not on file  Tobacco Use  . Smoking status: Never Smoker  . Smokeless tobacco: Never  Used  Substance and Sexual Activity  . Alcohol use: No    Comment: none since 1979  . Drug use: No  . Sexual activity: Not on file  Lifestyle  . Physical activity    Days per week: Not on file    Minutes per session: Not on file  . Stress: Not on file  Relationships  . Social Herbalist on phone: Not on file    Gets together: Not on file    Attends religious service: Not on file    Active member of club or organization: Not on file    Attends meetings of clubs or organizations: Not on file    Relationship status: Not on file  Other Topics Concern  . Not on file  Social History Narrative  . Not on file     Family History:  The patient's family history includes Diabetes in his brother and mother; Hypertension in his brother.   ROS:   Please see the history of present illness.    ROS All other systems reviewed and are negative.   PHYSICAL EXAM:   VS:  BP 104/60   Pulse 73   Ht _0  (1.676 m)   Wt 257 lb 12.8 oz (116.9 kg)   SpO2 95%   BMI 41.61 kg/m    GEN: Well nourished, well developed, in no acute distress  HEENT: normal  Neck: no JVD, carotid bruits, or masses Cardiac: RRR; no murmurs, rubs, or gallops,no edema  Respiratory:  clear to auscultation bilaterally, normal work of breathing GI: soft, nontender, nondistended, + BS MS: no deformity or atrophy  Skin: warm and dry, no rash Neuro:  Alert and Oriented x 3, Strength and sensation are intact Psych: euthymic mood, full affect  Wt Readings from Last 3  Encounters:  08/12/19 257 lb 12.8 oz (116.9 kg)  04/11/19 235 lb (106.6 kg)  04/08/19 254 lb 3.2 oz (115.3 kg)      Studies/Labs Reviewed:   EKG:  EKG is not ordered today.   Recent Labs: 04/08/2019: BNP 58.6; Magnesium 1.9 04/17/2019: BUN 15; Creatinine, Ser 0.76; Potassium 4.3; Sodium 139   Lipid Panel    Component Value Date/Time   CHOL  06/26/2007 1510    140        ATP III CLASSIFICATION:  <200     mg/dL   Desirable  200-239  mg/dL   Borderline High  >=240    mg/dL   High   TRIG 124 06/26/2007 1510   HDL 39 (L) 06/26/2007 1510   CHOLHDL 3.6 06/26/2007 1510   VLDL 25 06/26/2007 1510   LDLCALC  06/26/2007 1510    76        Total Cholesterol/HDL:CHD Risk Coronary Heart Disease Risk Table                     Men   Women  1/2 Average Risk   3.4   3.3    Additional studies/ records that were reviewed today include:   Echo 03/07/2018 LV EF: 60% -   65% Study Conclusions  - Left ventricle: Wall thickness was increased in a pattern of   moderate LVH. Systolic function was normal. The estimated   ejection fraction was in the range of 60% to 65%. Wall motion was   normal; there were no regional wall motion abnormalities. The   study is not technically sufficient to allow evaluation of LV   diastolic function. - Left atrium:  The atrium was normal in size. - Right atrium: The atrium was mildly dilated. - Tricuspid valve: There was mild regurgitation. - Pulmonary arteries: PA peak pressure: 32 mm Hg (S). - Inferior vena cava: The vessel was normal in size. The   respirophasic diameter changes were in the normal range (>= 50%),   consistent with normal central venous pressure.  Impressions:  - LVEF 60-65%, moderate LVH, normal wall motion, normal LA size,   mild RAE, mild TR, RVSP 32 mmHg, normal IVC.   ASSESSMENT:    1. Coronary artery disease involving coronary bypass graft of native heart with angina pectoris ([place])   2. Mixed hyperlipidemia   3. Chronic  diastolic heart failure ([place])   4. Chronic pulmonary embolism without acute cor pulmonale, unspecified pulmonary embolism type ([facility])      PLAN:  In order of problems listed above:  1. CAD s/p CABG: On aspirin.  Patient has a atypical chest pain that occurs at rest.  This only last about 30 seconds each.  No further ischemic work-up is recommended  2. Chronic diastolic heart failure: Euvolemic on physical exam  3. Hyperlipidemia: Obtain fasting lipid panel and CMP  4. Recurrent PE: Secondary to lupus anticoagulant.  Continue Coumadin.    Medication Adjustments/Labs and Tests Ordered: Current medicines are reviewed at length with the patient today.  Concerns regarding medicines are outlined above.  Medication changes, Labs and Tests ordered today are listed in the Patient Instructions below. Patient Instructions  Medication Instructions:  Your physician recommends that you continue on your current medications as directed. Please refer to the Current Medication list given to you today.  If you need a refill on your cardiac medications before your next appointment, please call your pharmacy.   Lab work: You will need to have labs (blood work) drawn in 1-2 weeks, can be done in [place]:  CMET  Fasting Lipid Panel-DO NOT EAT OR DRINK PAST MIDNIGHT If you have labs (blood work) drawn today and your tests are completely normal, you will receive your results only by: [name] MyChart Message (if you have MyChart) OR . A paper copy in the mail If you have any lab test that is abnormal or we need to change your treatment, we will call you to review the results.  Testing/Procedures: NONE ordered at this time of appointment   Follow-Up: At [facility], you and your health needs are our priority.  As part of our continuing mission to provide you with exceptional heart care, we have created designated Provider Care Teams.  These Care Teams include your primary Cardiologist (physician) and  Advanced Practice Providers (APPs -  Physician Assistants and Nurse Practitioners) who all work together to provide you with the care you need, when you need it. You will need a follow up appointment in 6 months-March 2021.  Please call our office in January 2021 to schedule this appointment.  You may see Quay Burow, MD or one of the following Advanced Practice Providers on your designated Care Team:   Kerin Ransom, PA-C Roby Lofts, [place] . Sande Rives, PA-C  Any Other Special Instructions Will Be Listed Below (If Applicable).       Hilbert Corrigan, [place]  08/13/2019 11:39 PM    [facility] [place], [place], [place]  [address] Phone: [phone]; Fax: [phone]

## 2019-08-12 NOTE — Patient Instructions (Signed)
Medication Instructions:  Your physician recommends that you continue on your current medications as directed. Please refer to the Current Medication list given to you today.  If you need a refill on your cardiac medications before your next appointment, please call your pharmacy.   Lab work: You will need to have labs (blood work) drawn in 1-2 weeks, can be done in [place]:  CMET  Fasting Lipid Panel-DO NOT EAT OR DRINK PAST MIDNIGHT If you have labs (blood work) drawn today and your tests are completely normal, you will receive your results only by: [name] MyChart Message (if you have MyChart) OR . A paper copy in the mail If you have any lab test that is abnormal or we need to change your treatment, we will call you to review the results.  Testing/Procedures: NONE ordered at this time of appointment   Follow-Up: At [facility], you and your health needs are our priority.  As part of our continuing mission to provide you with exceptional heart care, we have created designated Provider Care Teams.  These Care Teams include your primary Cardiologist (physician) and Advanced Practice Providers (APPs -  Physician Assistants and Nurse Practitioners) who all work together to provide you with the care you need, when you need it. You will need a follow up appointment in 6 months-March 2021.  Please call our office in January 2021 to schedule this appointment.  You may see Quay Burow, MD or one of the following Advanced Practice Providers on your designated Care Team:   Kerin Ransom, PA-C Roby Lofts, [place] . Sande Rives, PA-C  Any Other Special Instructions Will Be Listed Below (If Applicable).

## 2019-08-13 ENCOUNTER — Encounter: Payer: Self-pay | Admitting: Physician Assistant

## 2019-08-28 DIAGNOSIS — E782 Mixed hyperlipidemia: Secondary | ICD-10-CM | POA: Diagnosis not present

## 2019-08-28 DIAGNOSIS — I5032 Chronic diastolic (congestive) heart failure: Secondary | ICD-10-CM | POA: Diagnosis not present

## 2019-08-29 LAB — COMPREHENSIVE METABOLIC PANEL
ALT: 12 IU/L (ref 0–44)
AST: 17 IU/L (ref 0–40)
Albumin/Globulin Ratio: 1.9 (ref 1.2–2.2)
Albumin: 4.3 g/dL (ref 3.8–4.8)
Alkaline Phosphatase: 56 IU/L (ref 39–117)
BUN/Creatinine Ratio: 17 (ref 10–24)
BUN: 16 mg/dL (ref 8–27)
Bilirubin Total: 0.5 mg/dL (ref 0.0–1.2)
CO2: 29 mmol/L (ref 20–29)
Calcium: 8.9 mg/dL (ref 8.6–10.2)
Chloride: 100 mmol/L (ref 96–106)
Creatinine, Ser: 0.96 mg/dL (ref 0.76–1.27)
GFR calc Af Amer: 94 mL/min/{1.73_m2} (ref >59)
GFR calc non Af Amer: 81 mL/min/{1.73_m2} (ref >59)
Globulin, Total: 2.3 g/dL (ref 1.5–4.5)
Glucose: 147 mg/dL — ABNORMAL HIGH (ref 65–99)
Potassium: 4.3 mmol/L (ref 3.5–5.2)
Sodium: 140 mmol/L (ref 134–144)
Total Protein: 6.6 g/dL (ref 6.0–8.5)

## 2019-08-29 LAB — LIPID PANEL
Chol/HDL Ratio: 2.7 ratio (ref 0.0–5.0)
Cholesterol, Total: 120 mg/dL (ref 100–199)
HDL: 44 mg/dL (ref >39)
LDL Chol Calc (NIH): 60 mg/dL (ref 0–99)
Triglycerides: 83 mg/dL (ref 0–149)
VLDL Cholesterol Cal: 16 mg/dL (ref 5–40)

## 2019-09-02 NOTE — Progress Notes (Signed)
The patient has been notified of the result and verbalized understanding.  All questions (if any) were answered. Jacqulynn Cadet, [place] 09/02/2019 12:08 PM

## 2019-09-09 DIAGNOSIS — I10 Essential (primary) hypertension: Secondary | ICD-10-CM | POA: Diagnosis not present

## 2019-09-09 DIAGNOSIS — E782 Mixed hyperlipidemia: Secondary | ICD-10-CM | POA: Diagnosis not present

## 2019-09-09 DIAGNOSIS — I2510 Atherosclerotic heart disease of native coronary artery without angina pectoris: Secondary | ICD-10-CM | POA: Diagnosis not present

## 2019-09-09 DIAGNOSIS — Z7901 Long term (current) use of anticoagulants: Secondary | ICD-10-CM | POA: Diagnosis not present

## 2019-09-09 DIAGNOSIS — E1149 Type 2 diabetes mellitus with other diabetic neurological complication: Secondary | ICD-10-CM | POA: Diagnosis not present

## 2019-09-09 DIAGNOSIS — Z0000 Encounter for general adult medical examination without abnormal findings: Secondary | ICD-10-CM | POA: Diagnosis not present

## 2019-09-09 DIAGNOSIS — R760 Raised antibody titer: Secondary | ICD-10-CM | POA: Diagnosis not present

## 2019-09-09 DIAGNOSIS — Z86718 Personal history of other venous thrombosis and embolism: Secondary | ICD-10-CM | POA: Diagnosis not present

## 2019-09-09 DIAGNOSIS — Z23 Encounter for immunization: Secondary | ICD-10-CM | POA: Diagnosis not present

## 2019-09-30 DIAGNOSIS — M5411 Radiculopathy, occipito-atlanto-axial region: Secondary | ICD-10-CM | POA: Diagnosis not present

## 2019-09-30 DIAGNOSIS — M9901 Segmental and somatic dysfunction of cervical region: Secondary | ICD-10-CM | POA: Diagnosis not present

## 2019-10-02 DIAGNOSIS — M5411 Radiculopathy, occipito-atlanto-axial region: Secondary | ICD-10-CM | POA: Diagnosis not present

## 2019-10-02 DIAGNOSIS — M9901 Segmental and somatic dysfunction of cervical region: Secondary | ICD-10-CM | POA: Diagnosis not present

## 2019-10-03 DIAGNOSIS — M5411 Radiculopathy, occipito-atlanto-axial region: Secondary | ICD-10-CM | POA: Diagnosis not present

## 2019-10-03 DIAGNOSIS — M9901 Segmental and somatic dysfunction of cervical region: Secondary | ICD-10-CM | POA: Diagnosis not present

## 2019-10-07 DIAGNOSIS — M9901 Segmental and somatic dysfunction of cervical region: Secondary | ICD-10-CM | POA: Diagnosis not present

## 2019-10-07 DIAGNOSIS — M5411 Radiculopathy, occipito-atlanto-axial region: Secondary | ICD-10-CM | POA: Diagnosis not present

## 2019-10-08 DIAGNOSIS — M9901 Segmental and somatic dysfunction of cervical region: Secondary | ICD-10-CM | POA: Diagnosis not present

## 2019-10-08 DIAGNOSIS — M5411 Radiculopathy, occipito-atlanto-axial region: Secondary | ICD-10-CM | POA: Diagnosis not present

## 2019-10-17 DIAGNOSIS — M5411 Radiculopathy, occipito-atlanto-axial region: Secondary | ICD-10-CM | POA: Diagnosis not present

## 2019-10-17 DIAGNOSIS — M9901 Segmental and somatic dysfunction of cervical region: Secondary | ICD-10-CM | POA: Diagnosis not present

## 2019-10-18 DIAGNOSIS — Z7901 Long term (current) use of anticoagulants: Secondary | ICD-10-CM | POA: Diagnosis not present

## 2019-10-23 DIAGNOSIS — M9901 Segmental and somatic dysfunction of cervical region: Secondary | ICD-10-CM | POA: Diagnosis not present

## 2019-10-23 DIAGNOSIS — M5411 Radiculopathy, occipito-atlanto-axial region: Secondary | ICD-10-CM | POA: Diagnosis not present

## 2019-10-31 DIAGNOSIS — M5411 Radiculopathy, occipito-atlanto-axial region: Secondary | ICD-10-CM | POA: Diagnosis not present

## 2019-10-31 DIAGNOSIS — M9901 Segmental and somatic dysfunction of cervical region: Secondary | ICD-10-CM | POA: Diagnosis not present

## 2019-11-07 ENCOUNTER — Ambulatory Visit (INDEPENDENT_AMBULATORY_CARE_PROVIDER_SITE_OTHER): Payer: Medicare Other | Admitting: Cardiovascular Disease

## 2019-11-07 ENCOUNTER — Other Ambulatory Visit: Payer: Self-pay

## 2019-11-07 ENCOUNTER — Encounter: Payer: Self-pay | Admitting: Cardiovascular Disease

## 2019-11-07 DIAGNOSIS — I10 Essential (primary) hypertension: Secondary | ICD-10-CM | POA: Diagnosis not present

## 2019-11-07 DIAGNOSIS — R0602 Shortness of breath: Secondary | ICD-10-CM | POA: Diagnosis not present

## 2019-11-07 DIAGNOSIS — E782 Mixed hyperlipidemia: Secondary | ICD-10-CM

## 2019-11-07 DIAGNOSIS — I25810 Atherosclerosis of coronary artery bypass graft(s) without angina pectoris: Secondary | ICD-10-CM | POA: Diagnosis not present

## 2019-11-07 DIAGNOSIS — R760 Raised antibody titer: Secondary | ICD-10-CM

## 2019-11-07 NOTE — Assessment & Plan Note (Signed)
History of hyperlipidemia on statin therapy with lipid profile performed 08/28/2019 revealing total cholesterol 120, LDL 62 and HDL 44.

## 2019-11-07 NOTE — Assessment & Plan Note (Signed)
History of chronic dyspnea on exertion which is probably multifactorial precept from prior pulmonary emboli with his last 2D echo performed 03/07/2018 revealing normal LVEF with pulmonary arteries pressure of 32.  I think he has diastolic dysfunction as well as morbid obesity probably contributing to his shortness of breath.  He is on a diuretic.

## 2019-11-07 NOTE — Assessment & Plan Note (Signed)
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  I catheterized him 08/17/2006 revealing patent grafts normal LV function.  Myoview performed 01/04/2013 was nonischemic.  His most recent Myoview performed 05/30/2016 was nonischemic as well.  He denies chest pain but has chronic dyspnea.

## 2019-11-07 NOTE — Assessment & Plan Note (Signed)
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

## 2019-11-07 NOTE — Patient Instructions (Addendum)
Medication Instructions:  Your physician recommends that you continue on your current medications as directed. Please refer to the Current Medication list given to you today.  If you need a refill on your cardiac medications before your next appointment, please call your pharmacy.   Lab work: NONE  Testing/Procedures: NONE  Follow-Up: At [facility], you and your health needs are our priority.  As part of our continuing mission to provide you with exceptional heart care, we have created designated Provider Care Teams.  These Care Teams include your primary Cardiologist (physician) and Advanced Practice Providers (APPs -  Physician Assistants and Nurse Practitioners) who all work together to provide you with the care you need, when you need it. You may see Quay Burow, MD or one of the following Advanced Practice Providers on your designated Care Team:    Kerin Ransom, PA-C  [place], [place]  Coletta Memos, [place] Your physician wants you to follow-up in: 6 months with Almyra Deforest PA-C. Your physician wants you to follow-up in: 1 year with Dr Gwenlyn Found.

## 2019-11-07 NOTE — Progress Notes (Signed)
11/07/2019 Eduardo Butler   1951-03-19  [id number]  Primary Physician Orpah Melter, MD Primary Cardiologist: Lorretta Harp MD FACP, [place], [place], [place]  HPI:  Eduardo Butler is a 68 y.o.  moderately overweight married Caucasian male, father of 2, grandfather to 2 grandchildren who I last saw in the  virtually via video telemedicine visit 04/11/2019.Eduardo Butler  He is accompanied by his wife [name] today.  He has a history of CAD status post coronary artery bypass grafting x4 in March of 2003. 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 [name] follows. His other problems include hyperlipidemia and erectile dysfunction. I catheterized him at the [facility] August 17, 2006 revealing patent grafts with normal LV function. His last Myoview performed 01/04/13 was nonischemic Dr. Doyle Askew follows his lipid profile. When I saw him 2 months ago he was complaining of dyspnea on exertion. He saw one of our PAs a month later with increasing dyspnea on exertion. His diastolic was cut in half but remains bradycardic although he was clinically improved. His when necessary hydrochlorothiazide was changed to when necessary Lasix which she took over the weekend for several doses which resulted in improved dyspnea and decreased edema. I did discuss with him today so restriction. A 2-D echo was ordered that showed normal FUNCTION in a Myoview stress test that showed apical thinning with new ischemia in the inferior wall compared to a previous Myoview 2 years ago. Since I saw him3 months ago he continued to do well. He says he is stronger. I did adjust his diuretics which improved his lower extremity edema. He also admits to dietary indiscretion. He denies chest pain does have chronic dyspnea on exertion.  He has seen Almyra Deforest PA-C in the office 08/12/2019.Eduardo Butler  His major complaint is of dyspnea.  He has not lost any weight.  He  denies chest pain.  An echo performed 03/07/2018 was completely normal.  Since I saw him virtually 04/11/2019 he continues to complain of fatigue and dyspnea which are chronic complaints but no chest pain.  His weight is remained stable.   Current Meds  Medication Sig  . acetaminophen (TYLENOL) 500 MG tablet Take 1,000-1,500 mg by mouth every 6 (six) hours as needed for moderate pain.  Eduardo Butler ALPRAZolam (XANAX) 0.5 MG tablet Take 0.5 mg by mouth as needed for anxiety or sleep.  . Ascorbic Acid (VITAMIN C) 1000 MG tablet Take 1,000 mg by mouth daily.  Eduardo Butler aspirin EC 81 MG tablet Take 81 mg by mouth daily.  . carvedilol (COREG) 3.125 MG tablet TAKE 1 TABLET(3.125 MG) BY MOUTH TWICE DAILY  . CINNAMON PO Take 2,000 mg by mouth daily.  Water engineer Bandages & Supports (MEDICAL COMPRESSION STOCKINGS) MISC Knee High 20-30 mm compression stockings  . ezetimibe (ZETIA) 10 MG tablet Take 1 tablet (10 mg total) by mouth daily.  . folic acid (FOLVITE) 1 MG tablet Take 1 mg by mouth daily.   Eduardo Butler losartan (COZAAR) 50 MG tablet TAKE 1 TABLET(50 MG) BY MOUTH DAILY  . metFORMIN (GLUCOPHAGE) 1000 MG tablet Take 1,000 mg by mouth 2 (two) times daily with a meal.  . Misc Natural Products (IMMUNE FORMULA PO) Take by mouth. Immune: Vitamin C, D and Zinc. Chew 2 tablets daily.  Eduardo Butler omeprazole (PRILOSEC) 40 MG capsule TAKE 1 CAPSULE(40 MG) BY MOUTH DAILY AS NEEDED FOR HEARTBURN  . pioglitazone (ACTOS) 15 MG tablet Take 15 mg by  mouth daily.  . simvastatin (ZOCOR) 40 MG tablet Take 1 tablet (40 mg total) by mouth daily at 6 PM.  . traMADol (ULTRAM) 50 MG tablet TK 1 T PO Q 4 TO 6 H PRN P  . warfarin (COUMADIN) 4 MG tablet TK 1 T PO ONCE A WEEK ON WEDNESDAYS ONLY     Allergies  Allergen Reactions  . Morphine And Related     hallucinations    Social History   Socioeconomic History  . Marital status: Married    Spouse name: Not on file  . Number of children: Not on file  . Years of education: Not on file  . Highest  education level: Not on file  Occupational History  . Not on file  Tobacco Use  . Smoking status: Never Smoker  . Smokeless tobacco: Never Used  Substance and Sexual Activity  . Alcohol use: No    Comment: none since 1979  . Drug use: No  . Sexual activity: Not on file  Other Topics Concern  . Not on file  Social History Narrative  . Not on file   Social Determinants of Health   Financial Resource Strain:   . Difficulty of Paying Living Expenses: Not on file  Food Insecurity:   . Worried About Charity fundraiser in the Last Year: Not on file  . Ran Out of Food in the Last Year: Not on file  Transportation Needs:   . Lack of Transportation (Medical): Not on file  . Lack of Transportation (Non-Medical): Not on file  Physical Activity:   . Days of Exercise per Week: Not on file  . Minutes of Exercise per Session: Not on file  Stress:   . Feeling of Stress : Not on file  Social Connections:   . Frequency of Communication with Friends and Family: Not on file  . Frequency of Social Gatherings with Friends and Family: Not on file  . Attends Religious Services: Not on file  . Active Member of Clubs or Organizations: Not on file  . Attends Archivist Meetings: Not on file  . Marital Status: Not on file  Intimate Partner Violence:   . Fear of Current or Ex-Partner: Not on file  . Emotionally Abused: Not on file  . Physically Abused: Not on file  . Sexually Abused: Not on file     Review of Systems: General: negative for chills, fever, night sweats or weight changes.  Cardiovascular: negative for chest pain, dyspnea on exertion, edema, orthopnea, palpitations, paroxysmal nocturnal dyspnea or shortness of breath Dermatological: negative for rash Respiratory: negative for cough or wheezing Urologic: negative for hematuria Abdominal: negative for nausea, vomiting, diarrhea, bright red blood per rectum, melena, or hematemesis Neurologic: negative for visual changes,  syncope, or dizziness All other systems reviewed and are otherwise negative except as noted above.    Blood pressure 134/74, pulse 65, height 5\' 6"  (1.676 m), weight 258 lb (117 kg), SpO2 98 %.  General appearance: alert and no distress Neck: no adenopathy, no carotid bruit, no JVD, supple, symmetrical, trachea midline and thyroid not enlarged, symmetric, no tenderness/mass/nodules Lungs: clear to auscultation bilaterally Heart: regular rate and rhythm, S1, S2 normal, no murmur, click, rub or gallop Extremities: Trace to 1+ bilateral lower extreme edema Pulses: 2+ and symmetric Skin: Skin color, texture, turgor normal. No rashes or lesions Neurologic: Alert and oriented X 3, normal strength and tone. Normal symmetric reflexes. Normal coordination and gait  EKG sinus rhythm 65 without  ST or T wave changes.  Personally reviewed this EKG.  ASSESSMENT AND PLAN:   Hyperlipidemia History of hyperlipidemia on statin therapy with lipid profile performed 08/28/2019 revealing total cholesterol 120, LDL 62 and HDL 44.  DYSPNEA History of chronic dyspnea on exertion which is probably multifactorial precept from prior pulmonary emboli with his last 2D echo performed 03/07/2018 revealing normal LVEF with pulmonary arteries pressure of 32.  I think he has diastolic dysfunction as well as morbid obesity probably contributing to his shortness of breath.  He is on a diuretic.  Essential hypertension History of essential hypertension blood pressure measured at 134/74.  He is on carvedilol, and losartan.  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  I catheterized him 08/17/2006 revealing patent grafts normal LV function.  Myoview performed 01/04/2013 was nonischemic.  His most recent Myoview performed 05/30/2016 was nonischemic as well.  He denies chest pain but has chronic dyspnea.  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      Lorretta Harp MD FACP,FACC,FAHA, [facility] 11/07/2019 9:46 AM

## 2019-11-07 NOTE — Assessment & Plan Note (Signed)
History of essential hypertension blood pressure measured at 134/74.  He is on carvedilol, and losartan.

## 2019-11-25 ENCOUNTER — Telehealth: Payer: Self-pay

## 2019-11-25 NOTE — Telephone Encounter (Signed)
Pt called and inquired if he can take Covid Vaccine next month, and  if  It would interact with his current medication regimen.

## 2019-11-26 NOTE — Telephone Encounter (Signed)
There is no interaction with the Covid vaccine in any of his medications.  He needs to ask his primary care provider, Dr. Doyle Askew, about whether to take the vaccine.

## 2019-12-05 DIAGNOSIS — Z23 Encounter for immunization: Secondary | ICD-10-CM | POA: Diagnosis not present

## 2019-12-16 DIAGNOSIS — Z7901 Long term (current) use of anticoagulants: Secondary | ICD-10-CM | POA: Diagnosis not present

## 2019-12-25 DIAGNOSIS — Z7901 Long term (current) use of anticoagulants: Secondary | ICD-10-CM | POA: Diagnosis not present

## 2019-12-25 DIAGNOSIS — I2510 Atherosclerotic heart disease of native coronary artery without angina pectoris: Secondary | ICD-10-CM | POA: Diagnosis not present

## 2020-01-02 DIAGNOSIS — Z23 Encounter for immunization: Secondary | ICD-10-CM | POA: Diagnosis not present

## 2020-01-03 DIAGNOSIS — Z7901 Long term (current) use of anticoagulants: Secondary | ICD-10-CM | POA: Diagnosis not present

## 2020-01-08 DIAGNOSIS — M545 Low back pain: Secondary | ICD-10-CM | POA: Diagnosis not present

## 2020-01-08 DIAGNOSIS — E119 Type 2 diabetes mellitus without complications: Secondary | ICD-10-CM | POA: Diagnosis not present

## 2020-01-10 DIAGNOSIS — M545 Low back pain: Secondary | ICD-10-CM | POA: Diagnosis not present

## 2020-01-10 DIAGNOSIS — E119 Type 2 diabetes mellitus without complications: Secondary | ICD-10-CM | POA: Diagnosis not present

## 2020-01-17 DIAGNOSIS — M545 Low back pain: Secondary | ICD-10-CM | POA: Diagnosis not present

## 2020-01-17 DIAGNOSIS — E119 Type 2 diabetes mellitus without complications: Secondary | ICD-10-CM | POA: Diagnosis not present

## 2020-01-20 DIAGNOSIS — Z7901 Long term (current) use of anticoagulants: Secondary | ICD-10-CM | POA: Diagnosis not present

## 2020-01-20 DIAGNOSIS — I2510 Atherosclerotic heart disease of native coronary artery without angina pectoris: Secondary | ICD-10-CM | POA: Diagnosis not present

## 2020-01-20 DIAGNOSIS — E119 Type 2 diabetes mellitus without complications: Secondary | ICD-10-CM | POA: Diagnosis not present

## 2020-01-20 DIAGNOSIS — M545 Low back pain: Secondary | ICD-10-CM | POA: Diagnosis not present

## 2020-01-23 DIAGNOSIS — M545 Low back pain: Secondary | ICD-10-CM | POA: Diagnosis not present

## 2020-01-23 DIAGNOSIS — E119 Type 2 diabetes mellitus without complications: Secondary | ICD-10-CM | POA: Diagnosis not present

## 2020-01-27 DIAGNOSIS — Z7901 Long term (current) use of anticoagulants: Secondary | ICD-10-CM | POA: Diagnosis not present

## 2020-01-27 DIAGNOSIS — Z86718 Personal history of other venous thrombosis and embolism: Secondary | ICD-10-CM | POA: Diagnosis not present

## 2020-02-10 DIAGNOSIS — G4700 Insomnia, unspecified: Secondary | ICD-10-CM | POA: Diagnosis not present

## 2020-02-10 DIAGNOSIS — F419 Anxiety disorder, unspecified: Secondary | ICD-10-CM | POA: Diagnosis not present

## 2020-02-10 DIAGNOSIS — Z7901 Long term (current) use of anticoagulants: Secondary | ICD-10-CM | POA: Diagnosis not present

## 2020-02-19 DIAGNOSIS — Z86718 Personal history of other venous thrombosis and embolism: Secondary | ICD-10-CM | POA: Diagnosis not present

## 2020-02-19 DIAGNOSIS — Z7901 Long term (current) use of anticoagulants: Secondary | ICD-10-CM | POA: Diagnosis not present

## 2020-03-10 ENCOUNTER — Other Ambulatory Visit: Payer: Self-pay | Admitting: Cardiovascular Disease

## 2020-03-10 MED ORDER — FUROSEMIDE 80 MG PO TABS: 80.0000 mg | ORAL_TABLET | Freq: Every day | ORAL | 0 refills | Status: DC

## 2020-03-10 NOTE — Telephone Encounter (Signed)
*  STAT* If patient is at the pharmacy, call can be transferred to refill team.   1. Which medications need to be refilled? (please list name of each medication and dose if known) furosemide (LASIX) 80 MG tablet  2. Which pharmacy/location (including street and city if local pharmacy) is medication to be sent to? [facility] [place], [place], [place] [address]  3. Do they need a 30 day or 90 day supply? 2 pills  [place]. He is requesting 2 pills for while he is there.

## 2020-03-13 ENCOUNTER — Other Ambulatory Visit: Payer: Self-pay

## 2020-03-16 DIAGNOSIS — M9901 Segmental and somatic dysfunction of cervical region: Secondary | ICD-10-CM | POA: Diagnosis not present

## 2020-03-16 DIAGNOSIS — Z7901 Long term (current) use of anticoagulants: Secondary | ICD-10-CM | POA: Diagnosis not present

## 2020-03-16 DIAGNOSIS — M5411 Radiculopathy, occipito-atlanto-axial region: Secondary | ICD-10-CM | POA: Diagnosis not present

## 2020-03-16 DIAGNOSIS — Z86718 Personal history of other venous thrombosis and embolism: Secondary | ICD-10-CM | POA: Diagnosis not present

## 2020-03-19 DIAGNOSIS — M5411 Radiculopathy, occipito-atlanto-axial region: Secondary | ICD-10-CM | POA: Diagnosis not present

## 2020-03-19 DIAGNOSIS — M9901 Segmental and somatic dysfunction of cervical region: Secondary | ICD-10-CM | POA: Diagnosis not present

## 2020-03-23 DIAGNOSIS — Z7901 Long term (current) use of anticoagulants: Secondary | ICD-10-CM | POA: Diagnosis not present

## 2020-03-23 DIAGNOSIS — I2510 Atherosclerotic heart disease of native coronary artery without angina pectoris: Secondary | ICD-10-CM | POA: Diagnosis not present

## 2020-03-27 ENCOUNTER — Encounter: Payer: Self-pay | Admitting: *Deleted

## 2020-03-27 ENCOUNTER — Ambulatory Visit (INDEPENDENT_AMBULATORY_CARE_PROVIDER_SITE_OTHER): Payer: Medicare Other | Admitting: Physician Assistant

## 2020-03-27 ENCOUNTER — Encounter: Payer: Self-pay | Admitting: Physician Assistant

## 2020-03-27 ENCOUNTER — Other Ambulatory Visit: Payer: Self-pay

## 2020-03-27 VITALS — BP 113/66 | HR 80 | Temp 97.0°F | Ht 66.0 in | Wt 263.6 lb

## 2020-03-27 DIAGNOSIS — R0600 Dyspnea, unspecified: Secondary | ICD-10-CM | POA: Diagnosis not present

## 2020-03-27 DIAGNOSIS — E785 Hyperlipidemia, unspecified: Secondary | ICD-10-CM

## 2020-03-27 DIAGNOSIS — I25810 Atherosclerosis of coronary artery bypass graft(s) without angina pectoris: Secondary | ICD-10-CM

## 2020-03-27 DIAGNOSIS — R072 Precordial pain: Secondary | ICD-10-CM

## 2020-03-27 DIAGNOSIS — R42 Dizziness and giddiness: Secondary | ICD-10-CM

## 2020-03-27 DIAGNOSIS — I2782 Chronic pulmonary embolism: Secondary | ICD-10-CM

## 2020-03-27 MED ORDER — LOSARTAN POTASSIUM 25 MG PO TABS: 25.0000 mg | ORAL_TABLET | Freq: Every day | ORAL | 1 refills | Status: DC

## 2020-03-27 NOTE — Progress Notes (Signed)
Cardiology Office Note:    Date:  03/29/2020   ID:  [name], DOB October 18, 1951, MRN [id number]  PCP:  Orpah Melter, MD  Cardiologist:  Quay Burow, MD  Electrophysiologist:  None   Referring MD: Orpah Melter, MD   Chief Complaint  Patient presents with  . Follow-up    seen for Dr. Gwenlyn Found    History of Present Illness:    Eduardo Butler is a 69 y.o. 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 He had a cardiac catheterization on 08/17/2006 revealing patent grafts with normal LV function. Myoview in February 2014 was nonischemic. He was evaluated for worsening dyspnea in July 2017 with myoview, this revealed EF 54%, small defect of moderate severity present in the basal inferoseptal location, small defect of severe severity present in the apex location consistent with very mild ischemia, overall intermediate risk study.Echocardiogram obtained on 03/07/2018 showed EF 60 to 65%, moderate LVH, mild TR, PA peak pressure 32 mmHg.   Patient presents today for cardiology office visit.  He has been having some chest discomfort which he attributed to his old acid reflux symptom, however it has worsened significantly recently.  He also feels weak, fatigued and dizzy as well.  Dizziness sometimes occur with turning of the head, I will obtain a carotid Doppler.  I will also obtain CBC and basic metabolic panel to rule out secondary causes for dizziness.  As far as this chest discomfort, I do not think we can completely attributed the symptoms to GERD, I recommend a Myoview especially in light of his worsening dyspnea on exertion recently.  I will see him back in 4 to 5 weeks.  I also cut his losartan down to 25 mg daily to allow his blood pressure to drift up and to hopefully help with the dizzy  spell.  Otherwise he has minimal lower extremity edema and that he denies any orthopnea or PND.  His primary care doctor did talk to him about switching Coumadin to Eliquis, however he has recurrent PE was caused by lupus anticoagulant, I discussed this with our clinical pharmacist.  Eliquis is contraindicated in this group the patient.  Therefore I recommended him to stay on the Coumadin.  Past Medical History:  Diagnosis Date  . Anxiety   . Arthritis   . CHF (congestive heart failure) ([place])   . Coronary artery disease   . Diabetes ([place])   . H/O cardiac catheterization 08/25/2006   normal cath-patent grafts  . H/O Doppler ultrasound 02/21/2011   lower ext.venous doppler,, no treatment except support stockings are recommended if clinically indicated  . H/O echocardiogram 12/02/2010   EF 55% , no significant abnormalities  . History of stress test 01/04/2013   Lexiscan Myoview ; scattered PVC's ; LV EF 54% ; Normal stress test   . Hyperlipidemia   . Hypertension   . Obesity   . Peroneal DVT (deep venous thrombosis) ([place])   . Shortness of breath     Past Surgical History:  Procedure Laterality Date  . BYPASS GRAFT  2003   4 bypass   . CARDIAC CATHETERIZATION  Feb 15, 2002   S/P sudden cardiac death; three vessel coronary artery disease; preserved overall left ventricular systolic function with loculated mild to moderate anterolateral hypokinesis; no mitral regurgitation noted  . CORONARY ARTERY BYPASS GRAFT  2003  . HERNIA REPAIR    . LUMBAR LAMINECTOMY/ DECOMPRESSION WITH MET-RX Left 07/08/2014   Procedure: Left Lumbar Four-Five Metrex foraminal microdiskectomy;  Surgeon: Consuella Lose, MD;  Location: [facility];  Service: Neurosurgery;  Laterality: Left;  Left Lumbar Four-Five Metrex foraminal microdiskectomy  . PELVIC LAPAROSCOPY  2005  . SHOULDER SURGERY    . UMBILICAL HERNIA REPAIR  11/17/2011   Procedure: HERNIA REPAIR UMBILICAL ADULT;  Surgeon: Harl Bowie, MD;   Location: [facility];  Service: General;  Laterality: N/A;  umbilical hernia repair with mesh    Current Medications: Current Meds  Medication Sig  . acetaminophen (TYLENOL) 500 MG tablet Take 1,000-1,500 mg by mouth every 6 (six) hours as needed for moderate pain.  [name] ALPRAZolam (XANAX) 0.5 MG tablet Take 0.5 mg by mouth as needed for anxiety or sleep.  . Ascorbic Acid (VITAMIN C) 1000 MG tablet Take 1,000 mg by mouth daily.  [name] aspirin EC 81 MG tablet Take 81 mg by mouth daily.  . carvedilol (COREG) 3.125 MG tablet TAKE 1 TABLET(3.125 MG) BY MOUTH TWICE DAILY  . CINNAMON PO Take 2,000 mg by mouth daily.  Water engineer Bandages & Supports (MEDICAL COMPRESSION STOCKINGS) MISC Knee High 20-30 mm compression stockings  . ezetimibe (ZETIA) 10 MG tablet Take 1 tablet (10 mg total) by mouth daily.  . folic acid (FOLVITE) 1 MG tablet Take 1 mg by mouth daily.   . furosemide (LASIX) 80 MG tablet Take 1 tablet (80 mg total) by mouth daily.  [name] losartan (COZAAR) 25 MG tablet Take 1 tablet (25 mg total) by mouth daily.  . metFORMIN (GLUCOPHAGE) 1000 MG tablet Take 1,000 mg by mouth 2 (two) times daily with a meal.  . Misc Natural Products (IMMUNE FORMULA PO) Take by mouth. Immune: Vitamin C, D and Zinc. Chew 1 tablets daily.  [name] omeprazole (PRILOSEC) 40 MG capsule TAKE 1 CAPSULE(40 MG) BY MOUTH DAILY AS NEEDED FOR HEARTBURN  . pioglitazone (ACTOS) 15 MG tablet Take 15 mg by mouth daily.  . simvastatin (ZOCOR) 40 MG tablet Take 1 tablet (40 mg total) by mouth daily at 6 PM.  . warfarin (COUMADIN) 4 MG tablet Take 4 mg by mouth. Take 6 mg on Mondays, Tuesday, Thursday, Friday, Saturdays and Sundays. Take 4 mg on Wednesdays.  . [DISCONTINUED] losartan (COZAAR) 50 MG tablet TAKE 1 TABLET(50 MG) BY MOUTH DAILY     Allergies:   Morphine and related   Social History   Socioeconomic History  . Marital status: Married    Spouse name: Not on file  . Number of children: Not on file  . Years of  education: Not on file  . Highest education level: Not on file  Occupational History  . Not on file  Tobacco Use  . Smoking status: Never Smoker  . Smokeless tobacco: Never Used  Substance and Sexual Activity  . Alcohol use: No    Comment: none since 1979  . Drug use: No  . Sexual activity: Not on file  Other Topics Concern  . Not on file  Social History Narrative  . Not on file   Social Determinants of Health   Financial Resource Strain:   . Difficulty of Paying Living Expenses:   Food Insecurity:   . Worried About Charity fundraiser in the Last Year:   . Arboriculturist in the Last Year:   Transportation Needs:   . Film/video editor (Medical):   [name]  Lack of Transportation (Non-Medical):   Physical Activity:   . Days of Exercise per Week:   . Minutes of Exercise per Session:   Stress:   . Feeling of Stress :   Social Connections:   . Frequency of Communication with Friends and Family:   . Frequency of Social Gatherings with Friends and Family:   . Attends Religious Services:   . Active Member of Clubs or Organizations:   . Attends Archivist Meetings:   [name] Marital Status:      Family History: The patient's family history includes Diabetes in his brother and mother; Hypertension in his brother. There is no history of Heart attack or Stroke.  ROS:   Please see the history of present illness.     All other systems reviewed and are negative.  EKGs/Labs/Other Studies Reviewed:    The following studies were reviewed today:  Echo 03/07/2018 LV EF: 60% -  65%   -------------------------------------------------------------------  Indications:   (R06.02).   -------------------------------------------------------------------  History:  PMH: Acquired from the patient and from the patient&'s  chart. Dyspnea. Coronary artery disease. Risk factors:  Hypertension. Obese. Dyslipidemia.    -------------------------------------------------------------------  Study Conclusions   - Left ventricle: Wall thickness was increased in a pattern of  moderate LVH. Systolic function was normal. The estimated  ejection fraction was in the range of 60% to 65%. Wall motion was  normal; there were no regional wall motion abnormalities. The  study is not technically sufficient to allow evaluation of LV  diastolic function.  - Left atrium: The atrium was normal in size.  - Right atrium: The atrium was mildly dilated.  - Tricuspid valve: There was mild regurgitation.  - Pulmonary arteries: PA peak pressure: 32 mm Hg (S).  - Inferior vena cava: The vessel was normal in size. The  respirophasic diameter changes were in the normal range (>= 50%),  consistent with normal central venous pressure.   Impressions:   - LVEF 60-65%, moderate LVH, normal wall motion, normal LA size,  mild RAE, mild TR, RVSP 32 mmHg, normal IVC.   EKG:  EKG is ordered today.  The ekg ordered today demonstrates normal sinus rhythm without significant ST-T wave changes.  Recent Labs: 04/08/2019: BNP 58.6; Magnesium 1.9 08/28/2019: ALT 12 03/27/2020: BUN 14; Creatinine, Ser 0.89; Hemoglobin 12.1; Platelets 236; Potassium 5.0; Sodium 143  Recent Lipid Panel    Component Value Date/Time   CHOL 120 08/28/2019 0855   TRIG 83 08/28/2019 0855   HDL 44 08/28/2019 0855   CHOLHDL 2.7 08/28/2019 0855   CHOLHDL 3.6 06/26/2007 1510   VLDL 25 06/26/2007 1510   LDLCALC 60 08/28/2019 0855    Physical Exam:    VS:  BP 113/66   Pulse 80   Temp (!) 97 F (36.1 C) Comment: Forehead  Ht '5\' 6"'  (1.676 m)   Wt 263 lb 9.6 oz (119.6 kg)   SpO2 94%   BMI 42.55 kg/m     Wt Readings from Last 3 Encounters:  03/27/20 263 lb 9.6 oz (119.6 kg)  11/07/19 258 lb (117 kg)  08/12/19 257 lb 12.8 oz (116.9 kg)     GEN:  Well nourished, well developed in no acute distress HEENT: Normal NECK: No JVD; No carotid  bruits LYMPHATICS: No lymphadenopathy CARDIAC: RRR, no murmurs, rubs, gallops RESPIRATORY:  Clear to auscultation without rales, wheezing or rhonchi  ABDOMEN: Soft, non-tender, non-distended MUSCULOSKELETAL:  No edema; No deformity  SKIN: Warm and dry NEUROLOGIC:  Alert  and oriented x 3 PSYCHIATRIC:  Normal affect   ASSESSMENT:    1. Dyspnea, unspecified type   2. Precordial pain   3. Dizziness   4. Coronary artery disease involving coronary bypass graft of native heart without angina pectoris   5. Chronic pulmonary embolism without acute cor pulmonale, unspecified pulmonary embolism type ([place])   6. Hyperlipidemia LDL goal <100    PLAN:    In order of problems listed above:  1. Dyspnea and precordial chest pain: Patient does have history of acid reflux, however recent symptom has been progressively worsening.  I recommend a Myoview to further assess   2. Dizziness: This mainly occurs when he turns his head suddenly.  I will obtain a carotid Doppler.  I also decrease his losartan to 25 mg daily to allow his blood pressure to drift up.  CBC and basic metabolic panel to rule out other secondary causes for dizziness  3. CAD s/p CABG: On aspirin and carvedilol  4. Recurrent PE secondary to lupus anticoagulant disorder: I discussed the case with our clinical pharmacist, unfortunately Eliquis is no longer indicated in patients with lupus anticoagulant disorder.  In this subgroup of patient, Eliquis can actually increase the chance of recurrent PE.  We recommended continue Coumadin  5. Hyperlipidemia: On Zetia and Zocor   Medication Adjustments/Labs and Tests Ordered: Current medicines are reviewed at length with the patient today.  Concerns regarding medicines are outlined above.  Orders Placed This Encounter  Procedures  . CBC  . Basic metabolic panel  . MYOCARDIAL PERFUSION IMAGING  . EKG 12-Lead  . VAS US CAROTID   Meds ordered this encounter  Medications  . losartan  (COZAAR) 25 MG tablet    Sig: Take 1 tablet (25 mg total) by mouth daily.    Dispense:  90 tablet    Refill:  1    Patient Instructions  Medication Instructions:   DECREASE Losartan to 25 mg daily.  *If you need a refill on your cardiac medications before your next appointment, please call your pharmacy*  Lab Work: Your physician recommends that you return for lab work TODAY:   BMET  CBC If you have labs (blood work) drawn today and your tests are completely normal, you will receive your results only by: [name] MyChart Message (if you have MyChart) OR . A paper copy in the mail If you have any lab test that is abnormal or we need to change your treatment, we will call you to review the results.   Testing/Procedures: Your physician has requested that you have a carotid duplex. This test is an ultrasound of the carotid arteries in your neck. It looks at blood flow through these arteries that supply the brain with blood. Allow one hour for this exam. There are no restrictions or special instructions.  Your physician has requested that you have a lexiscan myoview. For further information please visit [email or web address]. Please follow instruction sheet, as given.   PLEASE SCHEDULE BOTH FOR 1-2 WEEKS   Follow-Up: At [facility], you and your health needs are our priority.  As part of our continuing mission to provide you with exceptional heart care, we have created designated Provider Care Teams.  These Care Teams include your primary Cardiologist (physician) and Advanced Practice Providers (APPs -  Physician Assistants and Nurse Practitioners) who all work together to provide you with the care you need, when you need it.  Your next appointment:   4-5 week(s)  The format for your next  appointment:   In Person  Provider:   Almyra Deforest, PA-C  Other Instructions      Signed, Almyra Deforest, [place]  03/29/2020 11:24 PM    [facility]

## 2020-03-27 NOTE — Patient Instructions (Addendum)
Medication Instructions:   DECREASE Losartan to 25 mg daily.  *If you need a refill on your cardiac medications before your next appointment, please call your pharmacy*  Lab Work: Your physician recommends that you return for lab work TODAY:   BMET  CBC If you have labs (blood work) drawn today and your tests are completely normal, you will receive your results only by: [name] MyChart Message (if you have MyChart) OR . A paper copy in the mail If you have any lab test that is abnormal or we need to change your treatment, we will call you to review the results.   Testing/Procedures: Your physician has requested that you have a carotid duplex. This test is an ultrasound of the carotid arteries in your neck. It looks at blood flow through these arteries that supply the brain with blood. Allow one hour for this exam. There are no restrictions or special instructions.  Your physician has requested that you have a lexiscan myoview. For further information please visit [email or web address]. Please follow instruction sheet, as given.   PLEASE SCHEDULE BOTH FOR 1-2 WEEKS   Follow-Up: At [facility], you and your health needs are our priority.  As part of our continuing mission to provide you with exceptional heart care, we have created designated Provider Care Teams.  These Care Teams include your primary Cardiologist (physician) and Advanced Practice Providers (APPs -  Physician Assistants and Nurse Practitioners) who all work together to provide you with the care you need, when you need it.  Your next appointment:   4-5 week(s)  The format for your next appointment:   In Person  Provider:   Almyra Deforest, PA-C  Other Instructions

## 2020-03-28 LAB — CBC
Hematocrit: 37.6 % (ref 37.5–51.0)
Hemoglobin: 12.1 g/dL — ABNORMAL LOW (ref 13.0–17.7)
MCH: 28.2 pg (ref 26.6–33.0)
MCHC: 32.2 g/dL (ref 31.5–35.7)
MCV: 88 fL (ref 79–97)
Platelets: 236 10*3/uL (ref 150–450)
RBC: 4.29 x10E6/uL (ref 4.14–5.80)
RDW: 13.7 % (ref 11.6–15.4)
WBC: 6.2 10*3/uL (ref 3.4–10.8)

## 2020-03-28 LAB — BASIC METABOLIC PANEL
BUN/Creatinine Ratio: 16 (ref 10–24)
BUN: 14 mg/dL (ref 8–27)
CO2: 28 mmol/L (ref 20–29)
Calcium: 9.4 mg/dL (ref 8.6–10.2)
Chloride: 100 mmol/L (ref 96–106)
Creatinine, Ser: 0.89 mg/dL (ref 0.76–1.27)
GFR calc Af Amer: 101 mL/min/{1.73_m2} (ref >59)
GFR calc non Af Amer: 87 mL/min/{1.73_m2} (ref >59)
Glucose: 115 mg/dL — ABNORMAL HIGH (ref 65–99)
Potassium: 5 mmol/L (ref 3.5–5.2)
Sodium: 143 mmol/L (ref 134–144)

## 2020-03-29 ENCOUNTER — Encounter: Payer: Self-pay | Admitting: Physician Assistant

## 2020-03-30 DIAGNOSIS — F419 Anxiety disorder, unspecified: Secondary | ICD-10-CM | POA: Diagnosis not present

## 2020-04-01 NOTE — Progress Notes (Signed)
Normal red blood cell count, stable renal function and electrolyte

## 2020-04-03 ENCOUNTER — Telehealth (HOSPITAL_COMMUNITY): Payer: Self-pay

## 2020-04-03 NOTE — Telephone Encounter (Signed)
Encounter complete. 

## 2020-04-06 DIAGNOSIS — Z7901 Long term (current) use of anticoagulants: Secondary | ICD-10-CM | POA: Diagnosis not present

## 2020-04-08 ENCOUNTER — Ambulatory Visit (HOSPITAL_COMMUNITY)
Admission: RE | Admit: 2020-04-08 | Discharge: 2020-04-08 | Disposition: A | Discharge status: Home / Self Care | Payer: Medicare Other | Source: Ambulatory Visit | Attending: Cardiovascular Disease | Admitting: Cardiovascular Disease

## 2020-04-08 ENCOUNTER — Ambulatory Visit (HOSPITAL_BASED_OUTPATIENT_CLINIC_OR_DEPARTMENT_OTHER)
Admission: RE | Admit: 2020-04-08 | Discharge: 2020-04-08 | Disposition: A | Discharge status: Home / Self Care | Payer: Medicare Other | Source: Ambulatory Visit | Attending: Physician Assistant | Admitting: Physician Assistant

## 2020-04-08 ENCOUNTER — Other Ambulatory Visit: Payer: Self-pay

## 2020-04-08 DIAGNOSIS — R0609 Other forms of dyspnea: Secondary | ICD-10-CM | POA: Insufficient documentation

## 2020-04-08 DIAGNOSIS — I2510 Atherosclerotic heart disease of native coronary artery without angina pectoris: Secondary | ICD-10-CM | POA: Insufficient documentation

## 2020-04-08 DIAGNOSIS — R072 Precordial pain: Secondary | ICD-10-CM | POA: Insufficient documentation

## 2020-04-08 DIAGNOSIS — I10 Essential (primary) hypertension: Secondary | ICD-10-CM | POA: Insufficient documentation

## 2020-04-08 DIAGNOSIS — Z951 Presence of aortocoronary bypass graft: Secondary | ICD-10-CM | POA: Diagnosis not present

## 2020-04-08 DIAGNOSIS — R42 Dizziness and giddiness: Secondary | ICD-10-CM

## 2020-04-08 DIAGNOSIS — R9439 Abnormal result of other cardiovascular function study: Secondary | ICD-10-CM | POA: Insufficient documentation

## 2020-04-08 DIAGNOSIS — K219 Gastro-esophageal reflux disease without esophagitis: Secondary | ICD-10-CM | POA: Insufficient documentation

## 2020-04-08 DIAGNOSIS — F419 Anxiety disorder, unspecified: Secondary | ICD-10-CM | POA: Insufficient documentation

## 2020-04-08 LAB — MYOCARDIAL PERFUSION IMAGING
LV dias vol: 151 mL (ref 62–150)
LV sys vol: 80 mL
Peak HR: 82 {beats}/min
Rest HR: 66 {beats}/min
SDS: 1
SRS: 0
SSS: 1
TID: 1.21

## 2020-04-08 MED ORDER — TECHNETIUM TC 99M TETROFOSMIN IV KIT
10.9000 | PACK | Freq: Once | INTRAVENOUS | Status: AC | PRN
  Administered 2020-04-08: 10.9 via INTRAVENOUS
  Filled 2020-04-08: qty 11

## 2020-04-08 MED ORDER — REGADENOSON 0.4 MG/5ML IV SOLN
0.4000 mg | Freq: Once | INTRAVENOUS | Status: AC
  Administered 2020-04-08: 0.4 mg via INTRAVENOUS

## 2020-04-08 MED ORDER — TECHNETIUM TC 99M TETROFOSMIN IV KIT
29.5000 | PACK | Freq: Once | INTRAVENOUS | Status: AC | PRN
  Administered 2020-04-08: 29.5 via INTRAVENOUS
  Filled 2020-04-08: qty 30

## 2020-04-09 ENCOUNTER — Telehealth: Payer: Self-pay

## 2020-04-09 NOTE — Telephone Encounter (Addendum)
Left voice message for the patient to give our office  a call bac to go over his recent carotid US results.   ----- Message from Almyra Deforest, [place] sent at 04/08/2020  2:33 PM EDT ----- No significant carotid artery disease to cause dizziness

## 2020-04-10 NOTE — Telephone Encounter (Signed)
Eduardo Butler is returning Terrah's call. Please advise.

## 2020-04-10 NOTE — Telephone Encounter (Signed)
Called and reviewed carotid US with pt. Notifed that his stress test had not been resulted yet and as soon as it had been we would contact him. Pt verbalized understanding with [place] results and would like a call back for the stress test as soon as possible.

## 2020-04-13 ENCOUNTER — Telehealth: Payer: Self-pay | Admitting: Cardiovascular Disease

## 2020-04-13 NOTE — Telephone Encounter (Signed)
Eduardo Butler is calling in regards to his stress test results performed on 04/08/20 due to still not receiving a call in regards to them. Please advise.

## 2020-04-13 NOTE — Telephone Encounter (Signed)
Pt c/o medication issue:  1. Name of Medication: furosemide (LASIX) 80 MG tablet  2. How are you currently taking this medication (dosage and times per day)? 1 tablet  By mouth daily   3. Are you having a reaction (difficulty breathing--STAT)? Yes   4. What is your medication issue? Eduardo Butler is calling stating when he took Lasix Saturday 04/11/20 his head felt like a basketball that was being bounced on the ground. He states his eyes water and burn and that his body feels terrible as if he'd been fighting with a pro boxer. He states he can tell he is holding fluid and is unable to pee. When he bends over to tie his shoes he states he feels as if his head is going to explode. Eduardo Butler is needing a refill on this medication, but is wanting to get this resolved before having it sent to the pharmacy. Please advise.

## 2020-04-13 NOTE — Telephone Encounter (Signed)
The patient called in for his results to the Stress Test. He stated that he has been anxious about receiving the results. He has been advised that per the result note:  Dr. Gwenlyn Found to review. Eduardo Butler, please arrange a earlier visit to discuss myoview result  An appointment has been made with Dr. Gwenlyn Found on 5/19 to discuss the results.

## 2020-04-13 NOTE — Telephone Encounter (Signed)
The patient called in stating that when he picked up his Furosemide that the pills looked different and he felt like they were not working. He spoke with the pharmacy and they advised him that they had a new manufacturer.   He asked for his pills to be switched back to the former manufacturer and has not has any problems since then. He has asked that a note be put on the prescription to call the patient before the prescription has been filled.

## 2020-04-14 ENCOUNTER — Telehealth: Payer: Self-pay | Admitting: Cardiovascular Disease

## 2020-04-14 ENCOUNTER — Other Ambulatory Visit: Payer: Self-pay

## 2020-04-14 DIAGNOSIS — M79642 Pain in left hand: Secondary | ICD-10-CM | POA: Diagnosis not present

## 2020-04-14 MED ORDER — FUROSEMIDE 80 MG PO TABS: 80.0000 mg | ORAL_TABLET | Freq: Every day | ORAL | 3 refills | Status: DC

## 2020-04-14 NOTE — Telephone Encounter (Signed)
Called and spoke with pt. States a nurse was supposed to send a new prescription in for his lasix but they had not yet.   He would like the refill sent to [facility] on [address]. He states he has had reactions to certain lasix because of different manufacturers and he needs to call the pharmacy before they fill it to make sure they have the correct one.  He states that the last form caused his head to hurt and feel like it was swelling and his eyes to water.  Notified I would send the [facility] to the [facility] [address] as we were speaking on the phone and keep the signature on the medication for the pharmacy to call pt prior to filling. Pt thankful for the help and had no other questions at that time.

## 2020-04-14 NOTE — Telephone Encounter (Signed)
Patient is requesting to speak with Lattie Haw in regards to their conversation yesterday. See phone note from 04/13/20.

## 2020-04-15 ENCOUNTER — Other Ambulatory Visit: Payer: Self-pay

## 2020-04-15 ENCOUNTER — Encounter: Payer: Self-pay | Admitting: Cardiovascular Disease

## 2020-04-15 ENCOUNTER — Ambulatory Visit (INDEPENDENT_AMBULATORY_CARE_PROVIDER_SITE_OTHER): Payer: Medicare Other | Admitting: Pharmacist Clinician (PhC)/ Clinical Pharmacy Specialist

## 2020-04-15 ENCOUNTER — Ambulatory Visit (INDEPENDENT_AMBULATORY_CARE_PROVIDER_SITE_OTHER): Payer: Medicare Other | Admitting: Cardiovascular Disease

## 2020-04-15 DIAGNOSIS — R760 Raised antibody titer: Secondary | ICD-10-CM

## 2020-04-15 DIAGNOSIS — I25810 Atherosclerosis of coronary artery bypass graft(s) without angina pectoris: Secondary | ICD-10-CM | POA: Diagnosis not present

## 2020-04-15 DIAGNOSIS — I10 Essential (primary) hypertension: Secondary | ICD-10-CM

## 2020-04-15 DIAGNOSIS — E782 Mixed hyperlipidemia: Secondary | ICD-10-CM | POA: Diagnosis not present

## 2020-04-15 DIAGNOSIS — R0602 Shortness of breath: Secondary | ICD-10-CM | POA: Diagnosis not present

## 2020-04-15 MED ORDER — SODIUM CHLORIDE 0.9% FLUSH: 3.0000 mL | Freq: Two times a day (BID) | INTRAVENOUS | Status: DC

## 2020-04-15 MED ORDER — ENOXAPARIN SODIUM 120 MG/0.8ML ~~LOC~~ SOLN: 120.0000 mg | Freq: Two times a day (BID) | SUBCUTANEOUS | 0 refills | Status: DC

## 2020-04-15 NOTE — Assessment & Plan Note (Addendum)
History of essential hypertension blood pressure measured today 116/66.  He is on carvedilol and losartan.

## 2020-04-15 NOTE — Addendum Note (Signed)
Addended by: Caprice Beaver T on: 04/15/2020 05:27 PM   Modules accepted: Orders, SmartSet

## 2020-04-15 NOTE — Patient Instructions (Signed)
Medication Instructions:  The current medical regimen is effective;  continue present plan and medications.  *If you need a refill on your cardiac medications before your next appointment, please call your pharmacy*   Lab Work: BMET, CBC, PT/INR today COVID TESTING- 05/24 at 11:55. If you have labs (blood work) drawn today and your tests are completely normal, you will receive your results only by: [name] MyChart Message (if you have MyChart) OR . A paper copy in the mail If you have any lab test that is abnormal or we need to change your treatment, we will call you to review the results.   Testing/Procedures: Echocardiogram - Your physician has requested that you have an echocardiogram. Echocardiography is a painless test that uses sound waves to create images of your heart. It provides your doctor with information about the size and shape of your heart and how well your heart's chambers and valves are working. This procedure takes approximately one hour. There are no restrictions for this procedure. This will be performed at our [facility] location - [address].  Your physician has requested that you have a cardiac catheterization. Cardiac catheterization is used to diagnose and/or treat various heart conditions. Doctors may recommend this procedure for a number of different reasons. The most common reason is to evaluate chest pain. Chest pain can be a symptom of coronary artery disease (CAD), and cardiac catheterization can show whether plaque is narrowing or blocking your heart's arteries. This procedure is also used to evaluate the valves, as well as measure the blood flow and oxygen levels in different parts of your heart. For further information please visit [email or web address]. Please follow instruction sheet, as given.   Follow-Up: At [facility], you and your health needs are our priority.  As part of our continuing mission to provide you with exceptional heart care, we  have created designated Provider Care Teams.  These Care Teams include your primary Cardiologist (physician) and Advanced Practice Providers (APPs -  Physician Assistants and Nurse Practitioners) who all work together to provide you with the care you need, when you need it.  We recommend signing up for the patient portal called "MyChart".  Sign up information is provided on this After Visit Summary.  MyChart is used to connect with patients for Virtual Visits (Telemedicine).  Patients are able to view lab/test results, encounter notes, upcoming appointments, etc.  Non-urgent messages can be sent to your provider as well.   To learn more about what you can do with MyChart, go to [email or web address].    Your next appointment:   2 week(s) post procedure  The format for your next appointment:   In Person  Provider:   Quay Burow, MD   Other Instructions    [place] [address] Dept: [place]: [place]  04/15/2020  You are scheduled for a Cardiac Catheterization on Thursday, May 27 with Dr. Quay Burow.  1. Please arrive at the [facility] (Main Entrance A) at [facility]: [address] [place], [place] [address] at 9:30 AM (This time is two hours before your procedure to ensure your preparation). Free valet parking service is available.   Special note: Every effort is made to have your procedure done on time. Please understand that emergencies sometimes delay scheduled procedures.  2. Diet: Do not eat solid foods after midnight.  The patient may have  clear liquids until 5am upon the day of the procedure.  3. Labs: You will need to have blood drawn on CBC, BMET, PT/INR- You do not need to be fasting.  4. Medication instructions in preparation for your procedure:   Contrast Allergy: No  Stop taking Coumadin (Warfarin) on  Sunday, May 23.   On the morning of your procedure, take your Aspirin and any morning medicines NOT listed above.  You may use sips of water.  5. Plan for one night stay--bring personal belongings. 6. Bring a current list of your medications and current insurance cards. 7. You MUST have a responsible person to drive you home. 8. Someone MUST be with you the first 24 hours after you arrive home or your discharge will be delayed. 9. Please wear clothes that are easy to get on and off and wear slip-on shoes.  Thank you for allowing [place] to care for you!   -- [facility]

## 2020-04-15 NOTE — Patient Instructions (Signed)
May 21: Last dose of Coumadin.  May 22: No Coumadin or Lovenox.  May 23: Inject Lovenox 120mg  in the fatty abdominal tissue at least 2 inches from the belly button twice a day about 12 hours apart, 8am and 8pm rotate sites. No Coumadin.  May 24: Inject Lovenox in the fatty tissue every 12 hours, 8am and 8pm. No Coumadin.  May 25: Inject Lovenox in the fatty tissue every 12 hours, 8am and 8pm. No Coumadin.  May 26: Inject Lovenox in the fatty tissue in the morning at 8 am (No PM dose). No Coumadin.  May 27: Procedure Day - No Lovenox - Resume Coumadin 4 mg in the evening  May 28: Resume Lovenox inject in the fatty tissue every 12 hours and take Coumadin 7.5 mg   May 29: Inject Lovenox in the fatty tissue every 12 hours and take Coumadin 7.5 mg  May 30: Inject Lovenox in the fatty tissue every 12 hours and take Coumadin 7.5 ng  May 31: Inject Lovenox in the fatty tissue every 12 hours and take Coumadin 5 mg  .  June 1: Inject Lovenox in the fatty tissue every 12 hours and take Coumadin 5 mg  June 2: Coumadin appt to check INR.

## 2020-04-15 NOTE — Progress Notes (Signed)
Patient in office to see Dr. Gwenlyn Found - scheduled for heart cath Thursday May 27.  Currently on warfarin for lupus anticoagulant.  Has INR followed by [facility] in [place].  Notes last INR was about 2 weeks ago, was 2.9.  Will have labs drawn today for procedure, including INR.    May 21: Last dose of Coumadin.  May 22: No Coumadin or Lovenox.  May 23: Inject Lovenox 120mg  in the fatty abdominal tissue at least 2 inches from the belly button twice a day about 12 hours apart, 8am and 8pm rotate sites. No Coumadin.  May 24: Inject Lovenox in the fatty tissue every 12 hours, 8am and 8pm. No Coumadin.  May 25: Inject Lovenox in the fatty tissue every 12 hours, 8am and 8pm. No Coumadin.  May 26: Inject Lovenox in the fatty tissue in the morning at 8 am (No PM dose). No Coumadin.  May 27: Procedure Day - No Lovenox - Resume Coumadin 4 mg in the evening  May 28: Resume Lovenox inject in the fatty tissue every 12 hours and take Coumadin 7.5 mg   May 29: Inject Lovenox in the fatty tissue every 12 hours and take Coumadin 7.5 mg  May 30: Inject Lovenox in the fatty tissue every 12 hours and take Coumadin 7.5 ng  May 31: Inject Lovenox in the fatty tissue every 12 hours and take Coumadin 5 mg  .  June 1: Inject Lovenox in the fatty tissue every 12 hours and take Coumadin 5 mg  June 2: Coumadin appt to check INR.

## 2020-04-15 NOTE — Assessment & Plan Note (Signed)
History of CAD status post coronary artery bypass grafting x4 March 2003.  He had a heart cath performed 08/17/2006 revealing patent grafts and normal LV function.  He is complained of progressive dyspnea.  Recent Myoview showed subtle anterior ischemia minimally changed from his last Myoview performed 7/17.  Based on his increasing symptoms and the fact he is 18 years post bypass grafting I decided to proceed with outpatient diagnostic coronary angiography.  He is he is on Coumadin because of a lupus anticoagulant with history of pulmonary embolism and DVT in the past.  Will need to stop his Coumadin 4 days in advance of this procedure and orchestrate a Lovenox bridge.

## 2020-04-15 NOTE — H&P (View-Only) (Signed)
04/15/2020 Eduardo Butler   07-26-1951  [id number]  Primary Physician Orpah Melter, MD Primary Cardiologist: Lorretta Harp MD FACP, [place], [place], [place]  HPI:  Eduardo Butler is a 69 y.o.  moderately overweight married Caucasian male, father of 2, grandfather to 2 grandchildren who I last saw in theoffice 11/07/2019.Marland KitchenHe is accompanied by his wife [name] today. 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 [name] follows. His other problems include hyperlipidemia and erectile dysfunction. I catheterized him at the [facility] September 28th , 2007 revealing patent grafts with normal LV function .  This suggested that he had a false positive Myoview stress test and medical therapy was recommended.  His last Myoview performed 01/04/13 was nonischemic Dr. Doyle Askew follows his lipid profile. When I saw him 2 months ago he was complaining of dyspnea on exertion. He saw one of our PAs a month later with increasing dyspnea on exertion. His diastolic was cut in half but remains bradycardic although he was clinically improved. His when necessary hydrochlorothiazide was changed to when necessary Lasix which she took over the weekend for several doses which resulted in improved dyspnea and decreased edema. I did discuss with him today so restriction. A 2-D echo was ordered that showed normal FUNCTION in a Myoview stress test that showed apical thinning with new ischemia in the inferior wall compared to a previous Myoview 2 years ago. Since I saw him3 months ago he continued to do well. He says he is stronger. I did adjust his diuretics which improved his lower extremity edema. He also  admits to dietary indiscretion. He denies chest pain does have chronic dyspnea on exertion.  He has seen Almyra Deforest PA-C in the office 03/29/2020... His major complaint is of dyspnea. He has not lost any weight. He denies chest pain. An echo performed 03/07/2018 was completely normal.  He has been more dyspneic and weaker since I saw him back in December.  A Myoview stress test ordered by Almyra Deforest 04/08/2020 showed subtle anterior ischemia only mildly different from the last Myoview performed 7/17.  However, given the fact that his last cath was 14 years ago I am going to plan on performing diagnostic coronary angiography after a Lovenox bridge.   Current Meds  Medication Sig  . acetaminophen (TYLENOL) 500 MG tablet Take 1,000-1,500 mg by mouth every 6 (six) hours as needed for moderate pain.  [name] albuterol (VENTOLIN HFA) 108 (90 Base) MCG/ACT inhaler INHALE 2 PUFFS BY MOUTH EVERY 6 HOURS FOR 1 WEEK AS NEEDED THEN AS NEEDED INHALATION 30 DAYS  . ALPRAZolam (XANAX) 0.5 MG tablet Take 0.5 mg by mouth as needed for anxiety or sleep.  . Ascorbic Acid (VITAMIN C) 1000 MG tablet Take 1,000 mg by mouth daily.  [name] aspirin EC 81 MG tablet Take 81 mg by mouth daily.  . carvedilol (COREG) 3.125 MG tablet TAKE 1 TABLET(3.125 MG) BY MOUTH TWICE DAILY  . CINNAMON PO Take 2,000 mg by mouth daily.  Water engineer Bandages & Supports (MEDICAL COMPRESSION STOCKINGS) MISC Knee High 20-30 mm compression stockings  . ezetimibe (ZETIA) 10 MG tablet Take 1 tablet (10 mg total) by mouth  daily.  . folic acid (FOLVITE) 1 MG tablet Take 1 mg by mouth daily.   . furosemide (LASIX) 80 MG tablet Take 1 tablet (80 mg total) by mouth daily. Call patient prior to filling, per patient request  . losartan (COZAAR) 25 MG tablet Take 1 tablet (25 mg total) by mouth daily.  . metFORMIN (GLUCOPHAGE) 1000 MG tablet Take 1,000 mg by mouth 2 (two) times daily with a meal.  . Misc Natural Products (IMMUNE FORMULA PO) Take by mouth. Immune:  Vitamin C, D and Zinc. Chew 1 tablets daily.  [name] omeprazole (PRILOSEC) 40 MG capsule TAKE 1 CAPSULE(40 MG) BY MOUTH DAILY AS NEEDED FOR HEARTBURN  . pioglitazone (ACTOS) 15 MG tablet Take 15 mg by mouth daily.  . simvastatin (ZOCOR) 40 MG tablet Take 1 tablet (40 mg total) by mouth daily at 6 PM.  . traZODone (DESYREL) 100 MG tablet Take 100 mg by mouth at bedtime as needed.  . warfarin (COUMADIN) 4 MG tablet Take 4 mg by mouth. Take 6 mg on Mondays, Tuesday, Thursday, Friday, Saturdays and Sundays. Take 4 mg on Wednesdays.     Allergies  Allergen Reactions  . Morphine And Related     hallucinations    Social History   Socioeconomic History  . Marital status: Married    Spouse name: Not on file  . Number of children: Not on file  . Years of education: Not on file  . Highest education level: Not on file  Occupational History  . Not on file  Tobacco Use  . Smoking status: Never Smoker  . Smokeless tobacco: Never Used  Substance and Sexual Activity  . Alcohol use: No    Comment: none since 1979  . Drug use: No  . Sexual activity: Not on file  Other Topics Concern  . Not on file  Social History Narrative  . Not on file   Social Determinants of Health   Financial Resource Strain:   . Difficulty of Paying Living Expenses:   Food Insecurity:   . Worried About Charity fundraiser in the Last Year:   . Arboriculturist in the Last Year:   Transportation Needs:   . Film/video editor (Medical):   [name] Lack of Transportation (Non-Medical):   Physical Activity:   . Days of Exercise per Week:   . Minutes of Exercise per Session:   Stress:   . Feeling of Stress :   Social Connections:   . Frequency of Communication with Friends and Family:   . Frequency of Social Gatherings with Friends and Family:   . Attends Religious Services:   . Active Member of Clubs or Organizations:   . Attends Archivist Meetings:   [name] Marital Status:   Intimate Partner Violence:   .  Fear of Current or Ex-Partner:   . Emotionally Abused:   [name] Physically Abused:   . Sexually Abused:      Review of Systems: General: negative for chills, fever, night sweats or weight changes.  Cardiovascular: negative for chest pain, dyspnea on exertion, edema, orthopnea, palpitations, paroxysmal nocturnal dyspnea or shortness of breath Dermatological: negative for rash Respiratory: negative for cough or wheezing Urologic: negative for hematuria Abdominal: negative for nausea, vomiting, diarrhea, bright red blood per rectum, melena, or hematemesis Neurologic: negative for visual changes, syncope, or dizziness All other systems reviewed and are otherwise negative except as noted above.    Blood pressure 116/66, pulse 71, height 5\' 6"  (1.676 m),  weight 261 lb 6.4 oz (118.6 kg), SpO2 94 %.  General appearance: alert and no distress Neck: no adenopathy, no carotid bruit, no JVD, supple, symmetrical, trachea midline and thyroid not enlarged, symmetric, no tenderness/mass/nodules Lungs: clear to auscultation bilaterally Heart: regular rate and rhythm, S1, S2 normal, no murmur, click, rub or gallop Extremities: 1-2+ pitting edema bilaterally Pulses: 2+ and symmetric Skin: Skin color, texture, turgor normal. No rashes or lesions Neurologic: Alert and oriented X 3, normal strength and tone. Normal symmetric reflexes. Normal coordination and gait  EKG not performed today  ASSESSMENT AND PLAN:   Hyperlipidemia History of hyperlipidemia on Zetia and simvastatin with lipid profile performed 08/28/2019 revealing total cholesterol 120, LDL 62 and HDL 44.  Essential hypertension History of essential hypertension blood pressure measured today 116/66.  He is on carvedilol and losartan.  DYSPNEA History of progressive shortness of breath with mildly abnormal Myoview stress test now 18 years post bypass grafting.  He is on furosemide and has 1-2+ pitting edema.  Coronary artery disease History  of CAD status post coronary artery bypass grafting x4 March 2003.  He had a heart cath performed 08/17/2006 revealing patent grafts and normal LV function.  He is complained of progressive dyspnea.  Recent Myoview showed subtle anterior ischemia minimally changed from his last Myoview performed 7/17.  Based on his increasing symptoms and the fact he is 18 years post bypass grafting I decided to proceed with outpatient diagnostic coronary angiography.  He is he is on Coumadin because of a lupus anticoagulant with history of pulmonary embolism and DVT in the past.  Will need to stop his Coumadin 4 days in advance of this procedure and orchestrate a Lovenox bridge.      Lorretta Harp MD [place], [facility] 04/15/2020 4:08 PM

## 2020-04-15 NOTE — Progress Notes (Signed)
04/15/2020 Eduardo Butler   11/06/1951  [id number]  Primary Physician Orpah Melter, MD Primary Cardiologist: Lorretta Harp MD FACP, [place], [place], [place]  HPI:  Eduardo Butler is a 69 y.o.  moderately overweight married Caucasian male, father of 2, grandfather to 2 grandchildren who I last saw in theoffice 11/07/2019.Marland KitchenHe is accompanied by his wife Eduardo Butler today. 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 [name] follows. His other problems include hyperlipidemia and erectile dysfunction. I catheterized him at the [facility] September 28th , 2007 revealing patent grafts with normal LV function .  This suggested that he had a false positive Myoview stress test and medical therapy was recommended.  His last Myoview performed 01/04/13 was nonischemic Dr. Doyle Askew follows his lipid profile. When I saw him 2 months ago he was complaining of dyspnea on exertion. He saw one of our PAs a month later with increasing dyspnea on exertion. His diastolic was cut in half but remains bradycardic although he was clinically improved. His when necessary hydrochlorothiazide was changed to when necessary Lasix which she took over the weekend for several doses which resulted in improved dyspnea and decreased edema. I did discuss with him today so restriction. A 2-D echo was ordered that showed normal FUNCTION in a Myoview stress test that showed apical thinning with new ischemia in the inferior wall compared to a previous Myoview 2 years ago. Since I saw him3 months ago he continued to do well. He says he is stronger. I did adjust his diuretics which improved his lower extremity edema. He also  admits to dietary indiscretion. He denies chest pain does have chronic dyspnea on exertion.  He has seen Almyra Deforest PA-C in the office 03/29/2020... His major complaint is of dyspnea. He has not lost any weight. He denies chest pain. An echo performed 03/07/2018 was completely normal.  He has been more dyspneic and weaker since I saw him back in December.  A Myoview stress test ordered by Almyra Deforest 04/08/2020 showed subtle anterior ischemia only mildly different from the last Myoview performed 7/17.  However, given the fact that his last cath was 14 years ago I am going to plan on performing diagnostic coronary angiography after a Lovenox bridge.   Current Meds  Medication Sig   acetaminophen (TYLENOL) 500 MG tablet Take 1,000-1,500 mg by mouth every 6 (six) hours as needed for moderate pain.   albuterol (VENTOLIN HFA) 108 (90 Base) MCG/ACT inhaler INHALE 2 PUFFS BY MOUTH EVERY 6 HOURS FOR 1 WEEK AS NEEDED THEN AS NEEDED INHALATION 30 DAYS   ALPRAZolam (XANAX) 0.5 MG tablet Take 0.5 mg by mouth as needed for anxiety or sleep.   Ascorbic Acid (VITAMIN C) 1000 MG tablet Take 1,000 mg by mouth daily.   aspirin EC 81 MG tablet Take 81 mg by mouth daily.   carvedilol (COREG) 3.125 MG tablet TAKE 1 TABLET(3.125 MG) BY MOUTH TWICE DAILY   CINNAMON PO Take 2,000 mg by mouth daily.   Elastic Bandages & Supports (MEDICAL COMPRESSION STOCKINGS) MISC Knee High 20-30 mm compression stockings   ezetimibe (ZETIA) 10 MG tablet Take 1 tablet (10 mg total) by mouth  daily.   folic acid (FOLVITE) 1 MG tablet Take 1 mg by mouth daily.    furosemide (LASIX) 80 MG tablet Take 1 tablet (80 mg total) by mouth daily. Call patient prior to filling, per patient request   losartan (COZAAR) 25 MG tablet Take 1 tablet (25 mg total) by mouth daily.   metFORMIN (GLUCOPHAGE) 1000 MG tablet Take 1,000 mg by mouth 2 (two) times daily with a meal.   Misc Natural Products (IMMUNE FORMULA PO) Take by mouth. Immune:  Vitamin C, D and Zinc. Chew 1 tablets daily.   omeprazole (PRILOSEC) 40 MG capsule TAKE 1 CAPSULE(40 MG) BY MOUTH DAILY AS NEEDED FOR HEARTBURN   pioglitazone (ACTOS) 15 MG tablet Take 15 mg by mouth daily.   simvastatin (ZOCOR) 40 MG tablet Take 1 tablet (40 mg total) by mouth daily at 6 PM.   traZODone (DESYREL) 100 MG tablet Take 100 mg by mouth at bedtime as needed.   warfarin (COUMADIN) 4 MG tablet Take 4 mg by mouth. Take 6 mg on Mondays, Tuesday, Thursday, Friday, Saturdays and Sundays. Take 4 mg on Wednesdays.     Allergies  Allergen Reactions   Morphine And Related     hallucinations    Social History   Socioeconomic History   Marital status: Married    Spouse name: Not on file   Number of children: Not on file   Years of education: Not on file   Highest education level: Not on file  Occupational History   Not on file  Tobacco Use   Smoking status: Never Smoker   Smokeless tobacco: Never Used  Substance and Sexual Activity   Alcohol use: No    Comment: none since 1979   Drug use: No   Sexual activity: Not on file  Other Topics Concern   Not on file  Social History Narrative   Not on file   Social Determinants of Health   Financial Resource Strain:    Difficulty of Paying Living Expenses:   Food Insecurity:    Worried About Charity fundraiser in the Last Year:    Arboriculturist in the Last Year:   Transportation Needs:    Film/video editor (Medical):    Lack of Transportation (Non-Medical):   Physical Activity:    Days of Exercise per Week:    Minutes of Exercise per Session:   Stress:    Feeling of Stress :   Social Connections:    Frequency of Communication with Friends and Family:    Frequency of Social Gatherings with Friends and Family:    Attends Religious Services:    Active Member of Clubs or Organizations:    Attends Music therapist:    Marital Status:   Intimate Partner Violence:     Fear of Current or Ex-Partner:    Emotionally Abused:    Physically Abused:    Sexually Abused:      Review of Systems: General: negative for chills, fever, night sweats or weight changes.  Cardiovascular: negative for chest pain, dyspnea on exertion, edema, orthopnea, palpitations, paroxysmal nocturnal dyspnea or shortness of breath Dermatological: negative for rash Respiratory: negative for cough or wheezing Urologic: negative for hematuria Abdominal: negative for nausea, vomiting, diarrhea, bright red blood per rectum, melena, or hematemesis Neurologic: negative for visual changes, syncope, or dizziness All other systems reviewed and are otherwise negative except as noted above.    Blood pressure 116/66, pulse 71, height 5\' 6"  (1.676 m),  weight 261 lb 6.4 oz (118.6 kg), SpO2 94 %.  General appearance: alert and no distress Neck: no adenopathy, no carotid bruit, no JVD, supple, symmetrical, trachea midline and thyroid not enlarged, symmetric, no tenderness/mass/nodules Lungs: clear to auscultation bilaterally Heart: regular rate and rhythm, S1, S2 normal, no murmur, click, rub or gallop Extremities: 1-2+ pitting edema bilaterally Pulses: 2+ and symmetric Skin: Skin color, texture, turgor normal. No rashes or lesions Neurologic: Alert and oriented X 3, normal strength and tone. Normal symmetric reflexes. Normal coordination and gait  EKG not performed today  ASSESSMENT AND PLAN:   Hyperlipidemia History of hyperlipidemia on Zetia and simvastatin with lipid profile performed 08/28/2019 revealing total cholesterol 120, LDL 62 and HDL 44.  Essential hypertension History of essential hypertension blood pressure measured today 116/66.  He is on carvedilol and losartan.  DYSPNEA History of progressive shortness of breath with mildly abnormal Myoview stress test now 18 years post bypass grafting.  He is on furosemide and has 1-2+ pitting edema.  Coronary artery disease History  of CAD status post coronary artery bypass grafting x4 March 2003.  He had a heart cath performed 08/17/2006 revealing patent grafts and normal LV function.  He is complained of progressive dyspnea.  Recent Myoview showed subtle anterior ischemia minimally changed from his last Myoview performed 7/17.  Based on his increasing symptoms and the fact he is 18 years post bypass grafting I decided to proceed with outpatient diagnostic coronary angiography.  He is he is on Coumadin because of a lupus anticoagulant with history of pulmonary embolism and DVT in the past.  Will need to stop his Coumadin 4 days in advance of this procedure and orchestrate a Lovenox bridge.      Lorretta Harp MD [place], [facility] 04/15/2020 4:08 PM

## 2020-04-15 NOTE — Assessment & Plan Note (Addendum)
History of hyperlipidemia on Zetia and simvastatin with lipid profile performed 08/28/2019 revealing total cholesterol 120, LDL 62 and HDL 44.

## 2020-04-15 NOTE — Assessment & Plan Note (Signed)
History of progressive shortness of breath with mildly abnormal Myoview stress test now 18 years post bypass grafting.  He is on furosemide and has 1-2+ pitting edema.

## 2020-04-16 LAB — BASIC METABOLIC PANEL
BUN/Creatinine Ratio: 13 (ref 10–24)
BUN: 12 mg/dL (ref 8–27)
CO2: 26 mmol/L (ref 20–29)
Calcium: 8.9 mg/dL (ref 8.6–10.2)
Chloride: 99 mmol/L (ref 96–106)
Creatinine, Ser: 0.9 mg/dL (ref 0.76–1.27)
GFR calc Af Amer: 100 mL/min/{1.73_m2} (ref >59)
GFR calc non Af Amer: 87 mL/min/{1.73_m2} (ref >59)
Glucose: 128 mg/dL — ABNORMAL HIGH (ref 65–99)
Potassium: 4.1 mmol/L (ref 3.5–5.2)
Sodium: 139 mmol/L (ref 134–144)

## 2020-04-16 LAB — CBC
Hematocrit: 36.1 % — ABNORMAL LOW (ref 37.5–51.0)
Hemoglobin: 11.7 g/dL — ABNORMAL LOW (ref 13.0–17.7)
MCH: 27.8 pg (ref 26.6–33.0)
MCHC: 32.4 g/dL (ref 31.5–35.7)
MCV: 86 fL (ref 79–97)
Platelets: 278 10*3/uL (ref 150–450)
RBC: 4.21 x10E6/uL (ref 4.14–5.80)
RDW: 13.8 % (ref 11.6–15.4)
WBC: 8.5 10*3/uL (ref 3.4–10.8)

## 2020-04-16 LAB — PROTIME-INR
INR: 2.6 — ABNORMAL HIGH (ref 0.9–1.2)
Prothrombin Time: 26.4 s — ABNORMAL HIGH (ref 9.1–12.0)

## 2020-04-20 ENCOUNTER — Other Ambulatory Visit (HOSPITAL_COMMUNITY)
Admission: RE | Admit: 2020-04-20 | Discharge: 2020-04-20 | Disposition: A | Discharge status: Home / Self Care | Payer: Medicare Other | Source: Ambulatory Visit | Attending: Cardiovascular Disease | Admitting: Cardiovascular Disease

## 2020-04-20 DIAGNOSIS — Z01812 Encounter for preprocedural laboratory examination: Secondary | ICD-10-CM | POA: Diagnosis not present

## 2020-04-20 DIAGNOSIS — Z20822 Contact with and (suspected) exposure to covid-19: Secondary | ICD-10-CM | POA: Diagnosis not present

## 2020-04-20 LAB — SARS CORONAVIRUS 2 (TAT 6-24 HRS): SARS Coronavirus 2: NEGATIVE

## 2020-04-21 ENCOUNTER — Ambulatory Visit (HOSPITAL_COMMUNITY)
Admission: RE | Admit: 2020-04-21 | Discharge: 2020-04-21 | Disposition: A | Discharge status: Home / Self Care | Payer: Medicare Other | Source: Ambulatory Visit | Attending: Cardiovascular Disease | Admitting: Cardiovascular Disease

## 2020-04-21 ENCOUNTER — Telehealth: Payer: Self-pay | Admitting: Physician Assistant

## 2020-04-21 ENCOUNTER — Telehealth: Payer: Self-pay | Admitting: *Deleted

## 2020-04-21 ENCOUNTER — Other Ambulatory Visit: Payer: Self-pay

## 2020-04-21 ENCOUNTER — Telehealth: Payer: Self-pay

## 2020-04-21 DIAGNOSIS — I509 Heart failure, unspecified: Secondary | ICD-10-CM | POA: Insufficient documentation

## 2020-04-21 DIAGNOSIS — E785 Hyperlipidemia, unspecified: Secondary | ICD-10-CM | POA: Insufficient documentation

## 2020-04-21 DIAGNOSIS — I110 Hypertensive heart disease with heart failure: Secondary | ICD-10-CM | POA: Diagnosis not present

## 2020-04-21 DIAGNOSIS — R0602 Shortness of breath: Secondary | ICD-10-CM

## 2020-04-21 DIAGNOSIS — E119 Type 2 diabetes mellitus without complications: Secondary | ICD-10-CM | POA: Insufficient documentation

## 2020-04-21 DIAGNOSIS — I25810 Atherosclerosis of coronary artery bypass graft(s) without angina pectoris: Secondary | ICD-10-CM | POA: Diagnosis not present

## 2020-04-21 NOTE — Progress Notes (Signed)
  Echocardiogram 2D Echocardiogram has been performed.  Lacrystal Barbe G Haniyyah Sakuma 04/21/2020, 11:21 AM

## 2020-04-21 NOTE — Telephone Encounter (Signed)
Contact patient 04/21/20 to schedule follow up visit, no answer left message °

## 2020-04-21 NOTE — Telephone Encounter (Signed)
Pt contacted pre-catheterization scheduled at [facility] for: Thursday Apr 23, 2020 11:30 AM Verified arrival time and place: [facility]) at: 9:30 AM   No solid food after midnight prior to cath, clear liquids until 5 AM day of procedure.  Hold: Coumadin-none 04/19/20 until post procedure Pt has lovenox bridge instructions. Metformin-day of procedure and 48 hours post procedure. Actos-AM of procedure Lasix-AM of procedure  Except hold medications AM meds can be  taken pre-cath with sip of water including: ASA 81 mg   Confirmed patient has responsible adult to drive home post procedure and observe 24 hours after arriving home: yes  You are allowed ONE visitor in the waiting room during your procedure. Both you and your visitor must wear masks.      COVID-19 Pre-Screening Questions:  . In the past 7 to 10 days have you had a cough,  shortness of breath, headache, congestion, fever (100 or greater) body aches, chills, sore throat, or sudden loss of taste or sense of smell? no . Have you been around anyone with known Covid 19 in the past 7 to 10 days? no . Have you been around anyone who is awaiting Covid 19 test results in the past 7 to 10 days? no . Have you been around anyone who has mentioned symptoms of Covid 19 within the past 7 to 10 days? no  Reviewed procedure/mask/visitor instructions, COVID-19 screening questions with patient.

## 2020-04-21 NOTE — Telephone Encounter (Signed)
Contacted pt and informed that Dr. Gwenlyn Found is requesting to move Cath procedure scheduled for 5/27 at 11:30a  to 7:30a. Pt agreeable and instructed to report to [facility] at 5:30 am.   Cath lab contacted and schedule changed.

## 2020-04-23 ENCOUNTER — Encounter (HOSPITAL_COMMUNITY)
Admission: RE | Disposition: A | Discharge status: Home / Self Care | Payer: Self-pay | Source: Home / Self Care | Attending: Cardiovascular Disease

## 2020-04-23 ENCOUNTER — Ambulatory Visit (HOSPITAL_COMMUNITY)
Admission: RE | Admit: 2020-04-23 | Discharge: 2020-04-23 | Disposition: A | Discharge status: Home / Self Care | Payer: Medicare Other | Attending: Cardiovascular Disease | Admitting: Cardiovascular Disease

## 2020-04-23 ENCOUNTER — Other Ambulatory Visit: Payer: Self-pay

## 2020-04-23 DIAGNOSIS — Z951 Presence of aortocoronary bypass graft: Secondary | ICD-10-CM | POA: Insufficient documentation

## 2020-04-23 DIAGNOSIS — D6862 Lupus anticoagulant syndrome: Secondary | ICD-10-CM | POA: Diagnosis not present

## 2020-04-23 DIAGNOSIS — E785 Hyperlipidemia, unspecified: Secondary | ICD-10-CM | POA: Diagnosis not present

## 2020-04-23 DIAGNOSIS — Z86711 Personal history of pulmonary embolism: Secondary | ICD-10-CM | POA: Diagnosis not present

## 2020-04-23 DIAGNOSIS — Z885 Allergy status to narcotic agent status: Secondary | ICD-10-CM | POA: Diagnosis not present

## 2020-04-23 DIAGNOSIS — Z7982 Long term (current) use of aspirin: Secondary | ICD-10-CM | POA: Insufficient documentation

## 2020-04-23 DIAGNOSIS — I10 Essential (primary) hypertension: Secondary | ICD-10-CM | POA: Insufficient documentation

## 2020-04-23 DIAGNOSIS — E669 Obesity, unspecified: Secondary | ICD-10-CM | POA: Insufficient documentation

## 2020-04-23 DIAGNOSIS — Z7984 Long term (current) use of oral hypoglycemic drugs: Secondary | ICD-10-CM | POA: Diagnosis not present

## 2020-04-23 DIAGNOSIS — Z6841 Body Mass Index (BMI) 40.0 and over, adult: Secondary | ICD-10-CM | POA: Insufficient documentation

## 2020-04-23 DIAGNOSIS — N529 Male erectile dysfunction, unspecified: Secondary | ICD-10-CM | POA: Diagnosis not present

## 2020-04-23 DIAGNOSIS — Z86718 Personal history of other venous thrombosis and embolism: Secondary | ICD-10-CM | POA: Diagnosis not present

## 2020-04-23 DIAGNOSIS — Z7901 Long term (current) use of anticoagulants: Secondary | ICD-10-CM | POA: Insufficient documentation

## 2020-04-23 DIAGNOSIS — Z79899 Other long term (current) drug therapy: Secondary | ICD-10-CM | POA: Insufficient documentation

## 2020-04-23 DIAGNOSIS — R0602 Shortness of breath: Secondary | ICD-10-CM

## 2020-04-23 DIAGNOSIS — I25810 Atherosclerosis of coronary artery bypass graft(s) without angina pectoris: Secondary | ICD-10-CM

## 2020-04-23 DIAGNOSIS — R0609 Other forms of dyspnea: Secondary | ICD-10-CM | POA: Diagnosis not present

## 2020-04-23 DIAGNOSIS — I2510 Atherosclerotic heart disease of native coronary artery without angina pectoris: Secondary | ICD-10-CM | POA: Diagnosis not present

## 2020-04-23 HISTORY — PX: LEFT HEART CATH AND CORS/GRAFTS ANGIOGRAPHY: CATH118250

## 2020-04-23 LAB — PROTIME-INR
INR: 1.1 (ref 0.8–1.2)
Prothrombin Time: 13.6 seconds (ref 11.4–15.2)

## 2020-04-23 LAB — GLUCOSE, CAPILLARY: Glucose-Capillary: 118 mg/dL — ABNORMAL HIGH (ref 70–99)

## 2020-04-23 SURGERY — LEFT HEART CATH AND CORS/GRAFTS ANGIOGRAPHY
Anesthesia: LOCAL

## 2020-04-23 MED ORDER — SODIUM CHLORIDE 0.9 % WEIGHT BASED INFUSION: 1.0000 mL/kg/h | INTRAVENOUS | Status: DC

## 2020-04-23 MED ORDER — SODIUM CHLORIDE 0.9 % IV SOLN: 250.0000 mL | INTRAVENOUS | Status: DC | PRN

## 2020-04-23 MED ORDER — ASPIRIN 81 MG PO CHEW: 81.0000 mg | CHEWABLE_TABLET | ORAL | Status: DC

## 2020-04-23 MED ORDER — MIDAZOLAM HCL 2 MG/2ML IJ SOLN
INTRAMUSCULAR | Status: AC
  Filled 2020-04-23: qty 2

## 2020-04-23 MED ORDER — SODIUM CHLORIDE 0.9% FLUSH: 3.0000 mL | INTRAVENOUS | Status: DC | PRN

## 2020-04-23 MED ORDER — HYDRALAZINE HCL 20 MG/ML IJ SOLN: 10.0000 mg | INTRAMUSCULAR | Status: DC | PRN

## 2020-04-23 MED ORDER — ASPIRIN 81 MG PO CHEW: 81.0000 mg | CHEWABLE_TABLET | Freq: Every day | ORAL | Status: DC

## 2020-04-23 MED ORDER — FENTANYL CITRATE (PF) 100 MCG/2ML IJ SOLN
INTRAMUSCULAR | Status: AC
  Filled 2020-04-23: qty 2

## 2020-04-23 MED ORDER — IOHEXOL 350 MG/ML SOLN
INTRAVENOUS | Status: DC | PRN
  Administered 2020-04-23: 80 mL via INTRA_ARTERIAL

## 2020-04-23 MED ORDER — HEPARIN (PORCINE) IN NACL 1000-0.9 UT/500ML-% IV SOLN
INTRAVENOUS | Status: AC
  Filled 2020-04-23: qty 500

## 2020-04-23 MED ORDER — HEPARIN (PORCINE) IN NACL 1000-0.9 UT/500ML-% IV SOLN
INTRAVENOUS | Status: DC | PRN
  Administered 2020-04-23 (×2): 500 mL

## 2020-04-23 MED ORDER — VERAPAMIL HCL 2.5 MG/ML IV SOLN
INTRAVENOUS | Status: AC
  Filled 2020-04-23: qty 2

## 2020-04-23 MED ORDER — ACETAMINOPHEN 325 MG PO TABS: 650.0000 mg | ORAL_TABLET | ORAL | Status: DC | PRN

## 2020-04-23 MED ORDER — HEPARIN SODIUM (PORCINE) 1000 UNIT/ML IJ SOLN
INTRAMUSCULAR | Status: AC
  Filled 2020-04-23: qty 1

## 2020-04-23 MED ORDER — SODIUM CHLORIDE 0.9% FLUSH: 3.0000 mL | Freq: Two times a day (BID) | INTRAVENOUS | Status: DC

## 2020-04-23 MED ORDER — LIDOCAINE HCL (PF) 1 % IJ SOLN
INTRAMUSCULAR | Status: AC
  Filled 2020-04-23: qty 30

## 2020-04-23 MED ORDER — IOHEXOL 350 MG/ML SOLN
INTRAVENOUS | Status: AC
  Filled 2020-04-23: qty 1

## 2020-04-23 MED ORDER — ONDANSETRON HCL 4 MG/2ML IJ SOLN: 4.0000 mg | Freq: Four times a day (QID) | INTRAMUSCULAR | Status: DC | PRN

## 2020-04-23 MED ORDER — SODIUM CHLORIDE 0.9 % WEIGHT BASED INFUSION
3.0000 mL/kg/h | INTRAVENOUS | Status: AC
  Administered 2020-04-23: 3 mL/kg/h via INTRAVENOUS

## 2020-04-23 MED ORDER — SODIUM CHLORIDE 0.9 % IV SOLN: INTRAVENOUS | Status: DC

## 2020-04-23 MED ORDER — FENTANYL CITRATE (PF) 100 MCG/2ML IJ SOLN
INTRAMUSCULAR | Status: DC | PRN
  Administered 2020-04-23: 25 ug via INTRAVENOUS

## 2020-04-23 MED ORDER — LIDOCAINE HCL (PF) 1 % IJ SOLN
INTRAMUSCULAR | Status: DC | PRN
  Administered 2020-04-23: 16 mL via INTRADERMAL

## 2020-04-23 MED ORDER — LABETALOL HCL 5 MG/ML IV SOLN: 10.0000 mg | INTRAVENOUS | Status: DC | PRN

## 2020-04-23 MED ORDER — MIDAZOLAM HCL 2 MG/2ML IJ SOLN
INTRAMUSCULAR | Status: DC | PRN
  Administered 2020-04-23: 1 mg via INTRAVENOUS

## 2020-04-23 SURGICAL SUPPLY — 8 items
CATH INFINITI 5FR MULTPACK ANG (CATHETERS) ×1 IMPLANT
CLOSURE MYNX CONTROL 5F (Vascular Products) ×1 IMPLANT
KIT HEART LEFT (KITS) ×2 IMPLANT
PACK CARDIAC CATHETERIZATION (CUSTOM PROCEDURE TRAY) ×2 IMPLANT
SHEATH PINNACLE 5F 10CM (SHEATH) ×1 IMPLANT
TRANSDUCER W/STOPCOCK (MISCELLANEOUS) ×2 IMPLANT
TUBING CIL FLEX 10 FLL-RA (TUBING) ×2 IMPLANT
WIRE EMERALD 3MM-J .035X150CM (WIRE) ×1 IMPLANT

## 2020-04-23 NOTE — Discharge Instructions (Signed)
Femoral Site Care This sheet gives you information about how to care for yourself after your procedure. Your health care provider may also give you more specific instructions. If you have problems or questions, contact your health care provider. What can I expect after the procedure? After the procedure, it is common to have:  Bruising that usually fades within 1-2 weeks.  Tenderness at the site. Follow these instructions at home: Wound care  Follow instructions from your health care provider about how to take care of your insertion site. Make sure you: ? Wash your hands with soap and water before you change your bandage (dressing). If soap and water are not available, use hand sanitizer. ? Change your dressing as told by your health care provider. ? Leave stitches (sutures), skin glue, or adhesive strips in place. These skin closures may need to stay in place for 2 weeks or longer. If adhesive strip edges start to loosen and curl up, you may trim the loose edges. Do not remove adhesive strips completely unless your health care provider tells you to do that.  Do not take baths, swim, or use a hot tub until your health care provider approves.  You may shower 24-48 hours after the procedure or as told by your health care provider. ? Gently wash the site with plain soap and water. ? Pat the area dry with a clean towel. ? Do not rub the site. This may cause bleeding.  Do not apply powder or lotion to the site. Keep the site clean and dry.  Check your femoral site every day for signs of infection. Check for: ? Redness, swelling, or pain. ? Fluid or blood. ? Warmth. ? Pus or a bad smell. Activity  For the first 2-3 days after your procedure, or as long as directed: ? Avoid climbing stairs as much as possible. ? Do not squat.  Do not lift anything that is heavier than 10 lb (4.5 kg), or the limit that you are told, until your health care provider says that it is safe.  Rest as  directed. ? Avoid sitting for a long time without moving. Get up to take short walks every 1-2 hours.  Do not drive for 24 hours if you were given a medicine to help you relax (sedative). General instructions  Take over-the-counter and prescription medicines only as told by your health care provider.  Keep all follow-up visits as told by your health care provider. This is important. Contact a health care provider if you have:  A fever or chills.  You have redness, swelling, or pain around your insertion site. Get help right away if:  The catheter insertion area swells very fast.  You pass out.  You suddenly start to sweat or your skin gets clammy.  The catheter insertion area is bleeding, and the bleeding does not stop when you hold steady pressure on the area.  The area near or just beyond the catheter insertion site becomes pale, cool, tingly, or numb. These symptoms may represent a serious problem that is an emergency. Do not wait to see if the symptoms will go away. Get medical help right away. Call your local emergency services (911 in the [place] Do not drive yourself to the hospital. Summary  After the procedure, it is common to have bruising that usually fades within 1-2 weeks.  Check your femoral site every day for signs of infection.  Do not lift anything that is heavier than 10 lb (4.5 kg), or the   limit that you are told, until your health care provider says that it is safe. This information is not intended to replace advice given to you by your health care provider. Make sure you discuss any questions you have with your health care provider. Document Revised: 11/27/2017 Document Reviewed: 11/27/2017 Elsevier Patient Education  2020 Elsevier Inc.  

## 2020-04-23 NOTE — Interval H&P Note (Signed)
Cath Lab Visit (complete for each Cath Lab visit)  Clinical Evaluation Leading to the Procedure:   ACS: No.  Non-ACS:    Anginal Classification: CCS I  Anti-ischemic medical therapy: Minimal Therapy (1 class of medications)  Non-Invasive Test Results: Low-risk stress test findings: cardiac mortality <1%/year  Prior CABG: Previous CABG      History and Physical Interval Note:  04/23/2020 8:24 AM  Eduardo Butler  has presented today for surgery, with the diagnosis of abnormal stress test.  The various methods of treatment have been discussed with the patient and family. After consideration of risks, benefits and other options for treatment, the patient has consented to  Procedure(s): LEFT HEART CATH AND CORS/GRAFTS ANGIOGRAPHY (N/A) as a surgical intervention.  The patient's history has been reviewed, patient examined, no change in status, stable for surgery.  I have reviewed the patient's chart and labs.  Questions were answered to the patient's satisfaction.     Quay Burow

## 2020-04-23 NOTE — Progress Notes (Signed)
Patient and wife was given discharge instructions. Both verbalized understanding. 

## 2020-04-30 DIAGNOSIS — Z86718 Personal history of other venous thrombosis and embolism: Secondary | ICD-10-CM | POA: Diagnosis not present

## 2020-04-30 DIAGNOSIS — Z7901 Long term (current) use of anticoagulants: Secondary | ICD-10-CM | POA: Diagnosis not present

## 2020-04-30 LAB — PROTIME-INR: INR: 2 — AB (ref 0.9–1.1)

## 2020-05-05 ENCOUNTER — Ambulatory Visit: Payer: Medicare Other | Admitting: Physician Assistant

## 2020-05-14 ENCOUNTER — Ambulatory Visit: Payer: Medicare Other | Admitting: Physician Assistant

## 2020-05-19 DIAGNOSIS — I10 Essential (primary) hypertension: Secondary | ICD-10-CM | POA: Diagnosis not present

## 2020-05-19 DIAGNOSIS — E1149 Type 2 diabetes mellitus with other diabetic neurological complication: Secondary | ICD-10-CM | POA: Diagnosis not present

## 2020-05-19 DIAGNOSIS — I2510 Atherosclerotic heart disease of native coronary artery without angina pectoris: Secondary | ICD-10-CM | POA: Diagnosis not present

## 2020-05-19 DIAGNOSIS — G4700 Insomnia, unspecified: Secondary | ICD-10-CM | POA: Diagnosis not present

## 2020-05-19 DIAGNOSIS — E782 Mixed hyperlipidemia: Secondary | ICD-10-CM | POA: Diagnosis not present

## 2020-06-02 DIAGNOSIS — M5011 Cervical disc disorder with radiculopathy,  high cervical region: Secondary | ICD-10-CM | POA: Diagnosis not present

## 2020-06-02 DIAGNOSIS — M9901 Segmental and somatic dysfunction of cervical region: Secondary | ICD-10-CM | POA: Diagnosis not present

## 2020-06-04 DIAGNOSIS — M9901 Segmental and somatic dysfunction of cervical region: Secondary | ICD-10-CM | POA: Diagnosis not present

## 2020-06-04 DIAGNOSIS — M5011 Cervical disc disorder with radiculopathy,  high cervical region: Secondary | ICD-10-CM | POA: Diagnosis not present

## 2020-06-05 DIAGNOSIS — H5203 Hypermetropia, bilateral: Secondary | ICD-10-CM | POA: Diagnosis not present

## 2020-06-05 DIAGNOSIS — Z135 Encounter for screening for eye and ear disorders: Secondary | ICD-10-CM | POA: Diagnosis not present

## 2020-06-05 DIAGNOSIS — H2513 Age-related nuclear cataract, bilateral: Secondary | ICD-10-CM | POA: Diagnosis not present

## 2020-06-05 DIAGNOSIS — E119 Type 2 diabetes mellitus without complications: Secondary | ICD-10-CM | POA: Diagnosis not present

## 2020-06-07 ENCOUNTER — Other Ambulatory Visit: Payer: Self-pay

## 2020-06-07 ENCOUNTER — Emergency Department (HOSPITAL_COMMUNITY)
Admission: EM | Admit: 2020-06-07 | Discharge: 2020-06-07 | Disposition: A | Discharge status: Home / Self Care | Payer: Medicare Other | Attending: Emergency Medicine | Admitting: Emergency Medicine

## 2020-06-07 ENCOUNTER — Encounter (HOSPITAL_COMMUNITY): Payer: Self-pay

## 2020-06-07 DIAGNOSIS — Z5321 Procedure and treatment not carried out due to patient leaving prior to being seen by health care provider: Secondary | ICD-10-CM | POA: Diagnosis not present

## 2020-06-07 DIAGNOSIS — M545 Low back pain: Secondary | ICD-10-CM | POA: Diagnosis not present

## 2020-06-07 NOTE — ED Notes (Signed)
Pt stated he was leaving. 

## 2020-06-07 NOTE — ED Triage Notes (Signed)
Patient complains of lower back pain x 2 weeks with pain radiating x 2 weeks. Has seen chiropractor x 2 with some relief, hx of back problems. No known injury

## 2020-06-08 DIAGNOSIS — S336XXA Sprain of sacroiliac joint, initial encounter: Secondary | ICD-10-CM | POA: Diagnosis not present

## 2020-06-08 DIAGNOSIS — M4727 Other spondylosis with radiculopathy, lumbosacral region: Secondary | ICD-10-CM | POA: Diagnosis not present

## 2020-06-08 DIAGNOSIS — M9904 Segmental and somatic dysfunction of sacral region: Secondary | ICD-10-CM | POA: Diagnosis not present

## 2020-06-08 DIAGNOSIS — M9903 Segmental and somatic dysfunction of lumbar region: Secondary | ICD-10-CM | POA: Diagnosis not present

## 2020-06-09 DIAGNOSIS — M4727 Other spondylosis with radiculopathy, lumbosacral region: Secondary | ICD-10-CM | POA: Diagnosis not present

## 2020-06-09 DIAGNOSIS — M9903 Segmental and somatic dysfunction of lumbar region: Secondary | ICD-10-CM | POA: Diagnosis not present

## 2020-06-09 DIAGNOSIS — M9904 Segmental and somatic dysfunction of sacral region: Secondary | ICD-10-CM | POA: Diagnosis not present

## 2020-06-09 DIAGNOSIS — S336XXA Sprain of sacroiliac joint, initial encounter: Secondary | ICD-10-CM | POA: Diagnosis not present

## 2020-06-10 DIAGNOSIS — M4727 Other spondylosis with radiculopathy, lumbosacral region: Secondary | ICD-10-CM | POA: Diagnosis not present

## 2020-06-10 DIAGNOSIS — M9903 Segmental and somatic dysfunction of lumbar region: Secondary | ICD-10-CM | POA: Diagnosis not present

## 2020-06-10 DIAGNOSIS — S336XXA Sprain of sacroiliac joint, initial encounter: Secondary | ICD-10-CM | POA: Diagnosis not present

## 2020-06-10 DIAGNOSIS — M9904 Segmental and somatic dysfunction of sacral region: Secondary | ICD-10-CM | POA: Diagnosis not present

## 2020-06-11 ENCOUNTER — Other Ambulatory Visit: Payer: Self-pay | Admitting: Cardiovascular Disease

## 2020-06-11 DIAGNOSIS — S336XXA Sprain of sacroiliac joint, initial encounter: Secondary | ICD-10-CM | POA: Diagnosis not present

## 2020-06-11 DIAGNOSIS — M9904 Segmental and somatic dysfunction of sacral region: Secondary | ICD-10-CM | POA: Diagnosis not present

## 2020-06-11 DIAGNOSIS — M4727 Other spondylosis with radiculopathy, lumbosacral region: Secondary | ICD-10-CM | POA: Diagnosis not present

## 2020-06-11 DIAGNOSIS — M9903 Segmental and somatic dysfunction of lumbar region: Secondary | ICD-10-CM | POA: Diagnosis not present

## 2020-06-12 DIAGNOSIS — M5430 Sciatica, unspecified side: Secondary | ICD-10-CM | POA: Diagnosis not present

## 2020-06-22 DIAGNOSIS — M9903 Segmental and somatic dysfunction of lumbar region: Secondary | ICD-10-CM | POA: Diagnosis not present

## 2020-06-22 DIAGNOSIS — M4727 Other spondylosis with radiculopathy, lumbosacral region: Secondary | ICD-10-CM | POA: Diagnosis not present

## 2020-06-22 DIAGNOSIS — S336XXA Sprain of sacroiliac joint, initial encounter: Secondary | ICD-10-CM | POA: Diagnosis not present

## 2020-06-22 DIAGNOSIS — M9904 Segmental and somatic dysfunction of sacral region: Secondary | ICD-10-CM | POA: Diagnosis not present

## 2020-06-23 DIAGNOSIS — Z7901 Long term (current) use of anticoagulants: Secondary | ICD-10-CM | POA: Diagnosis not present

## 2020-06-23 DIAGNOSIS — I2510 Atherosclerotic heart disease of native coronary artery without angina pectoris: Secondary | ICD-10-CM | POA: Diagnosis not present

## 2020-06-23 DIAGNOSIS — M4727 Other spondylosis with radiculopathy, lumbosacral region: Secondary | ICD-10-CM | POA: Diagnosis not present

## 2020-06-23 DIAGNOSIS — M9904 Segmental and somatic dysfunction of sacral region: Secondary | ICD-10-CM | POA: Diagnosis not present

## 2020-06-23 DIAGNOSIS — S336XXA Sprain of sacroiliac joint, initial encounter: Secondary | ICD-10-CM | POA: Diagnosis not present

## 2020-06-23 DIAGNOSIS — M9903 Segmental and somatic dysfunction of lumbar region: Secondary | ICD-10-CM | POA: Diagnosis not present

## 2020-06-24 DIAGNOSIS — M4727 Other spondylosis with radiculopathy, lumbosacral region: Secondary | ICD-10-CM | POA: Diagnosis not present

## 2020-06-24 DIAGNOSIS — S336XXA Sprain of sacroiliac joint, initial encounter: Secondary | ICD-10-CM | POA: Diagnosis not present

## 2020-06-24 DIAGNOSIS — M9904 Segmental and somatic dysfunction of sacral region: Secondary | ICD-10-CM | POA: Diagnosis not present

## 2020-06-24 DIAGNOSIS — M9903 Segmental and somatic dysfunction of lumbar region: Secondary | ICD-10-CM | POA: Diagnosis not present

## 2020-06-25 DIAGNOSIS — M9904 Segmental and somatic dysfunction of sacral region: Secondary | ICD-10-CM | POA: Diagnosis not present

## 2020-06-25 DIAGNOSIS — M9903 Segmental and somatic dysfunction of lumbar region: Secondary | ICD-10-CM | POA: Diagnosis not present

## 2020-06-25 DIAGNOSIS — M4727 Other spondylosis with radiculopathy, lumbosacral region: Secondary | ICD-10-CM | POA: Diagnosis not present

## 2020-06-25 DIAGNOSIS — S336XXA Sprain of sacroiliac joint, initial encounter: Secondary | ICD-10-CM | POA: Diagnosis not present

## 2020-06-27 DIAGNOSIS — G4700 Insomnia, unspecified: Secondary | ICD-10-CM | POA: Diagnosis not present

## 2020-06-27 DIAGNOSIS — I10 Essential (primary) hypertension: Secondary | ICD-10-CM | POA: Diagnosis not present

## 2020-06-27 DIAGNOSIS — E782 Mixed hyperlipidemia: Secondary | ICD-10-CM | POA: Diagnosis not present

## 2020-06-27 DIAGNOSIS — E1149 Type 2 diabetes mellitus with other diabetic neurological complication: Secondary | ICD-10-CM | POA: Diagnosis not present

## 2020-06-27 DIAGNOSIS — I2510 Atherosclerotic heart disease of native coronary artery without angina pectoris: Secondary | ICD-10-CM | POA: Diagnosis not present

## 2020-06-30 DIAGNOSIS — S336XXA Sprain of sacroiliac joint, initial encounter: Secondary | ICD-10-CM | POA: Diagnosis not present

## 2020-06-30 DIAGNOSIS — M9903 Segmental and somatic dysfunction of lumbar region: Secondary | ICD-10-CM | POA: Diagnosis not present

## 2020-06-30 DIAGNOSIS — M9904 Segmental and somatic dysfunction of sacral region: Secondary | ICD-10-CM | POA: Diagnosis not present

## 2020-06-30 DIAGNOSIS — M4727 Other spondylosis with radiculopathy, lumbosacral region: Secondary | ICD-10-CM | POA: Diagnosis not present

## 2020-07-02 DIAGNOSIS — M9904 Segmental and somatic dysfunction of sacral region: Secondary | ICD-10-CM | POA: Diagnosis not present

## 2020-07-02 DIAGNOSIS — S336XXA Sprain of sacroiliac joint, initial encounter: Secondary | ICD-10-CM | POA: Diagnosis not present

## 2020-07-02 DIAGNOSIS — M4727 Other spondylosis with radiculopathy, lumbosacral region: Secondary | ICD-10-CM | POA: Diagnosis not present

## 2020-07-02 DIAGNOSIS — M9903 Segmental and somatic dysfunction of lumbar region: Secondary | ICD-10-CM | POA: Diagnosis not present

## 2020-07-03 DIAGNOSIS — Z7901 Long term (current) use of anticoagulants: Secondary | ICD-10-CM | POA: Diagnosis not present

## 2020-07-06 DIAGNOSIS — M9903 Segmental and somatic dysfunction of lumbar region: Secondary | ICD-10-CM | POA: Diagnosis not present

## 2020-07-06 DIAGNOSIS — M9904 Segmental and somatic dysfunction of sacral region: Secondary | ICD-10-CM | POA: Diagnosis not present

## 2020-07-06 DIAGNOSIS — M4727 Other spondylosis with radiculopathy, lumbosacral region: Secondary | ICD-10-CM | POA: Diagnosis not present

## 2020-07-06 DIAGNOSIS — S336XXA Sprain of sacroiliac joint, initial encounter: Secondary | ICD-10-CM | POA: Diagnosis not present

## 2020-07-07 DIAGNOSIS — M9903 Segmental and somatic dysfunction of lumbar region: Secondary | ICD-10-CM | POA: Diagnosis not present

## 2020-07-07 DIAGNOSIS — M9904 Segmental and somatic dysfunction of sacral region: Secondary | ICD-10-CM | POA: Diagnosis not present

## 2020-07-07 DIAGNOSIS — M4727 Other spondylosis with radiculopathy, lumbosacral region: Secondary | ICD-10-CM | POA: Diagnosis not present

## 2020-07-07 DIAGNOSIS — S336XXA Sprain of sacroiliac joint, initial encounter: Secondary | ICD-10-CM | POA: Diagnosis not present

## 2020-07-13 DIAGNOSIS — M5416 Radiculopathy, lumbar region: Secondary | ICD-10-CM | POA: Diagnosis not present

## 2020-07-13 DIAGNOSIS — Z6841 Body Mass Index (BMI) 40.0 and over, adult: Secondary | ICD-10-CM | POA: Diagnosis not present

## 2020-07-13 DIAGNOSIS — R030 Elevated blood-pressure reading, without diagnosis of hypertension: Secondary | ICD-10-CM | POA: Diagnosis not present

## 2020-07-15 DIAGNOSIS — Z7901 Long term (current) use of anticoagulants: Secondary | ICD-10-CM | POA: Diagnosis not present

## 2020-07-15 DIAGNOSIS — Z86718 Personal history of other venous thrombosis and embolism: Secondary | ICD-10-CM | POA: Diagnosis not present

## 2020-07-17 ENCOUNTER — Other Ambulatory Visit: Payer: Self-pay | Admitting: Neurosurgery

## 2020-07-17 DIAGNOSIS — M5416 Radiculopathy, lumbar region: Secondary | ICD-10-CM

## 2020-07-21 ENCOUNTER — Ambulatory Visit (INDEPENDENT_AMBULATORY_CARE_PROVIDER_SITE_OTHER): Payer: Medicare Other

## 2020-07-21 ENCOUNTER — Other Ambulatory Visit: Payer: Self-pay

## 2020-07-21 DIAGNOSIS — M5416 Radiculopathy, lumbar region: Secondary | ICD-10-CM

## 2020-07-21 DIAGNOSIS — M545 Low back pain: Secondary | ICD-10-CM | POA: Diagnosis not present

## 2020-07-23 DIAGNOSIS — M5126 Other intervertebral disc displacement, lumbar region: Secondary | ICD-10-CM | POA: Diagnosis not present

## 2020-07-23 DIAGNOSIS — Z6841 Body Mass Index (BMI) 40.0 and over, adult: Secondary | ICD-10-CM | POA: Diagnosis not present

## 2020-07-23 DIAGNOSIS — M5416 Radiculopathy, lumbar region: Secondary | ICD-10-CM | POA: Diagnosis not present

## 2020-08-04 ENCOUNTER — Other Ambulatory Visit: Payer: Self-pay

## 2020-08-04 ENCOUNTER — Telehealth: Payer: Self-pay | Admitting: Cardiovascular Disease

## 2020-08-04 MED ORDER — FUROSEMIDE 80 MG PO TABS: 80.0000 mg | ORAL_TABLET | Freq: Every day | ORAL | 3 refills | Status: DC

## 2020-08-04 NOTE — Telephone Encounter (Signed)
*  STAT* If patient is at the pharmacy, call can be transferred to refill team.   1. Which medications need to be refilled? (please list name of each medication and dose if known) furosemide (LASIX) 80 MG tablet  2. Which pharmacy/location (including street and city if local pharmacy) is medication to be sent to? [place]  3. Do they need a 30 day or 90 day supply? 90 day supply

## 2020-08-05 ENCOUNTER — Other Ambulatory Visit: Payer: Self-pay

## 2020-08-05 DIAGNOSIS — Z86718 Personal history of other venous thrombosis and embolism: Secondary | ICD-10-CM | POA: Diagnosis not present

## 2020-08-05 DIAGNOSIS — Z7901 Long term (current) use of anticoagulants: Secondary | ICD-10-CM | POA: Diagnosis not present

## 2020-08-05 MED ORDER — FUROSEMIDE 80 MG PO TABS: 80.0000 mg | ORAL_TABLET | Freq: Every day | ORAL | 3 refills | Status: DC

## 2020-08-06 DIAGNOSIS — M5011 Cervical disc disorder with radiculopathy,  high cervical region: Secondary | ICD-10-CM | POA: Diagnosis not present

## 2020-08-06 DIAGNOSIS — M9901 Segmental and somatic dysfunction of cervical region: Secondary | ICD-10-CM | POA: Diagnosis not present

## 2020-08-12 ENCOUNTER — Other Ambulatory Visit: Payer: Medicare Other

## 2020-08-25 DIAGNOSIS — M9901 Segmental and somatic dysfunction of cervical region: Secondary | ICD-10-CM | POA: Diagnosis not present

## 2020-08-25 DIAGNOSIS — Z86718 Personal history of other venous thrombosis and embolism: Secondary | ICD-10-CM | POA: Diagnosis not present

## 2020-08-25 DIAGNOSIS — M5011 Cervical disc disorder with radiculopathy,  high cervical region: Secondary | ICD-10-CM | POA: Diagnosis not present

## 2020-08-25 DIAGNOSIS — Z7901 Long term (current) use of anticoagulants: Secondary | ICD-10-CM | POA: Diagnosis not present

## 2020-08-26 DIAGNOSIS — E1149 Type 2 diabetes mellitus with other diabetic neurological complication: Secondary | ICD-10-CM | POA: Diagnosis not present

## 2020-08-26 DIAGNOSIS — I2510 Atherosclerotic heart disease of native coronary artery without angina pectoris: Secondary | ICD-10-CM | POA: Diagnosis not present

## 2020-08-26 DIAGNOSIS — E782 Mixed hyperlipidemia: Secondary | ICD-10-CM | POA: Diagnosis not present

## 2020-08-26 DIAGNOSIS — G4700 Insomnia, unspecified: Secondary | ICD-10-CM | POA: Diagnosis not present

## 2020-08-26 DIAGNOSIS — I10 Essential (primary) hypertension: Secondary | ICD-10-CM | POA: Diagnosis not present

## 2020-09-09 DIAGNOSIS — Z7901 Long term (current) use of anticoagulants: Secondary | ICD-10-CM | POA: Diagnosis not present

## 2020-09-09 DIAGNOSIS — Z23 Encounter for immunization: Secondary | ICD-10-CM | POA: Diagnosis not present

## 2020-09-09 DIAGNOSIS — Z86718 Personal history of other venous thrombosis and embolism: Secondary | ICD-10-CM | POA: Diagnosis not present

## 2020-09-12 ENCOUNTER — Other Ambulatory Visit: Payer: Self-pay

## 2020-09-12 ENCOUNTER — Encounter (HOSPITAL_BASED_OUTPATIENT_CLINIC_OR_DEPARTMENT_OTHER): Payer: Self-pay | Admitting: Emergency Medicine

## 2020-09-12 DIAGNOSIS — J069 Acute upper respiratory infection, unspecified: Secondary | ICD-10-CM | POA: Diagnosis not present

## 2020-09-12 DIAGNOSIS — Z7982 Long term (current) use of aspirin: Secondary | ICD-10-CM | POA: Diagnosis not present

## 2020-09-12 DIAGNOSIS — R059 Cough, unspecified: Secondary | ICD-10-CM | POA: Diagnosis present

## 2020-09-12 DIAGNOSIS — I2510 Atherosclerotic heart disease of native coronary artery without angina pectoris: Secondary | ICD-10-CM | POA: Diagnosis not present

## 2020-09-12 DIAGNOSIS — Z7901 Long term (current) use of anticoagulants: Secondary | ICD-10-CM | POA: Insufficient documentation

## 2020-09-12 DIAGNOSIS — I509 Heart failure, unspecified: Secondary | ICD-10-CM | POA: Insufficient documentation

## 2020-09-12 DIAGNOSIS — B349 Viral infection, unspecified: Secondary | ICD-10-CM | POA: Diagnosis not present

## 2020-09-12 DIAGNOSIS — I110 Hypertensive heart disease with heart failure: Secondary | ICD-10-CM | POA: Diagnosis not present

## 2020-09-12 DIAGNOSIS — E785 Hyperlipidemia, unspecified: Secondary | ICD-10-CM | POA: Diagnosis not present

## 2020-09-12 DIAGNOSIS — Z79899 Other long term (current) drug therapy: Secondary | ICD-10-CM | POA: Insufficient documentation

## 2020-09-12 DIAGNOSIS — E1169 Type 2 diabetes mellitus with other specified complication: Secondary | ICD-10-CM | POA: Insufficient documentation

## 2020-09-12 DIAGNOSIS — Z951 Presence of aortocoronary bypass graft: Secondary | ICD-10-CM | POA: Insufficient documentation

## 2020-09-12 DIAGNOSIS — Z20822 Contact with and (suspected) exposure to covid-19: Secondary | ICD-10-CM | POA: Diagnosis not present

## 2020-09-12 DIAGNOSIS — Z7984 Long term (current) use of oral hypoglycemic drugs: Secondary | ICD-10-CM | POA: Diagnosis not present

## 2020-09-12 LAB — RESPIRATORY PANEL BY RT PCR (FLU A&B, COVID)
Influenza A by PCR: NEGATIVE
Influenza B by PCR: NEGATIVE
SARS Coronavirus 2 by RT PCR: NEGATIVE

## 2020-09-12 NOTE — ED Triage Notes (Signed)
Reports he got his flu vaccine on Wednesday then started feeling weak with cough.  Thinks he got the flu from the vaccine.

## 2020-09-13 ENCOUNTER — Emergency Department (HOSPITAL_BASED_OUTPATIENT_CLINIC_OR_DEPARTMENT_OTHER)
Admission: EM | Admit: 2020-09-13 | Discharge: 2020-09-13 | Disposition: A | Discharge status: Home / Self Care | Payer: Medicare Other | Attending: Emergency Medicine | Admitting: Emergency Medicine

## 2020-09-13 ENCOUNTER — Emergency Department (HOSPITAL_BASED_OUTPATIENT_CLINIC_OR_DEPARTMENT_OTHER): Payer: Medicare Other

## 2020-09-13 DIAGNOSIS — J069 Acute upper respiratory infection, unspecified: Secondary | ICD-10-CM | POA: Diagnosis not present

## 2020-09-13 DIAGNOSIS — R059 Cough, unspecified: Secondary | ICD-10-CM | POA: Diagnosis not present

## 2020-09-13 MED ORDER — CETIRIZINE HCL 10 MG PO TABS: 10.0000 mg | ORAL_TABLET | Freq: Once | ORAL | 0 refills | Status: DC | PRN

## 2020-09-13 NOTE — ED Provider Notes (Signed)
[facility] Provider Note   CSN: [id number] Arrival date & time: 09/12/20  2117     History Chief Complaint  Patient presents with  . flu like symptoms    Eduardo Butler is a 69 y.o. male.  HPI    69 year old male comes in a chief complaint of flulike symptoms. Patient has history of CHF, CAD, diabetes.  He reports that he had his flu vaccine 2 days ago.  He is feeling slightly unwell yesterday.  Today he has been having scratchy throat, congestion, cough, weakness.  He felt like his symptoms were getting worse so he decided to come to the ER to ensure he did not have flu or Covid.   Past Medical History:  Diagnosis Date  . Anxiety   . Arthritis   . CHF (congestive heart failure) ([place])   . Coronary artery disease   . Diabetes ([place])   . H/O cardiac catheterization 08/25/2006   normal cath-patent grafts  . H/O Doppler ultrasound 02/21/2011   lower ext.venous doppler,, no treatment except support stockings are recommended if clinically indicated  . H/O echocardiogram 12/02/2010   EF 55% , no significant abnormalities  . History of stress test 01/04/2013   Lexiscan Myoview ; scattered PVC's ; LV EF 54% ; Normal stress test   . Hyperlipidemia   . Hypertension   . Obesity   . Peroneal DVT (deep venous thrombosis) ([place])   . Shortness of breath     Patient Active Problem List   Diagnosis Date Noted  . 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 Long term (current) use of anticoagulants 07/02/2014  . Lupus anticoagulant positive 07/02/2014  . Essential hypertension 03/03/2014  . Coronary artery disease 03/03/2014  . Umbilical hernia 09/27/5944  . Hyperlipidemia 01/07/2011  . ANXIETY 01/07/2011  . 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 DEEP VENOUS THROMBOPHLEBITIS, RECURRENT 01/07/2011  . WEIGHT GAIN, ABNORMAL 01/07/2011  . DYSPNEA 01/07/2011    Past Surgical History:  Procedure Laterality Date  .  BYPASS GRAFT  2003   4 bypass   . CARDIAC CATHETERIZATION  02/11/02   S/P sudden cardiac death; three vessel coronary artery disease; preserved overall left ventricular systolic function with loculated mild to moderate anterolateral hypokinesis; no mitral regurgitation noted  . CORONARY ARTERY BYPASS GRAFT  2003  . HERNIA REPAIR    . LEFT HEART CATH AND CORS/GRAFTS ANGIOGRAPHY N/A 04/23/2020   Procedure: LEFT HEART CATH AND CORS/GRAFTS ANGIOGRAPHY;  Surgeon: Lorretta Harp, MD;  Location: [facility];  Service: Cardiovascular;  Laterality: N/A;  . LUMBAR LAMINECTOMY/ DECOMPRESSION WITH MET-RX Left 07/08/2014   Procedure: Left Lumbar Four-Five Metrex foraminal microdiskectomy;  Surgeon: Consuella Lose, MD;  Location: [facility];  Service: Neurosurgery;  Laterality: Left;  Left Lumbar Four-Five Metrex foraminal microdiskectomy  . PELVIC LAPAROSCOPY  2005  . SHOULDER SURGERY    . UMBILICAL HERNIA REPAIR  11/17/2011   Procedure: HERNIA REPAIR UMBILICAL ADULT;  Surgeon: Harl Bowie, MD;  Location: [facility];  Service: General;  Laterality: N/A;  umbilical hernia repair with mesh       Family History  Problem Relation Age of Onset  . Diabetes Mother   . Hypertension Brother   . Diabetes Brother   . Heart attack Neg Hx   . Stroke Neg Hx     Social History   Tobacco Use  . Smoking status: Never Smoker  . Smokeless tobacco: Never Used  Substance Use Topics  . Alcohol use: No    Comment: none since 1979  . Drug use: No    Home Medications Prior to Admission medications   Medication Sig Start Date End Date Taking? Authorizing Provider  acetaminophen (TYLENOL) 500 MG tablet Take 1,000-1,500 mg by mouth every 6 (six) hours as needed for moderate pain.    [provider]  albuterol (VENTOLIN HFA) 108 (90 Base) MCG/ACT inhaler Inhale 2 puffs into the lungs every 6 (six) hours as needed for shortness of breath.  12/25/19   [provider]  ALPRAZolam Duanne Moron) 0.5 MG tablet Take 0.5 mg by mouth daily as needed for anxiety or sleep.     [provider]  Ascorbic Acid (VITAMIN C) 1000 MG tablet Take 1,000 mg by mouth daily.    [provider]  aspirin EC 81 MG tablet Take 81 mg by mouth daily.    [provider]  carvedilol (COREG) 3.125 MG tablet TAKE 1 TABLET(3.125 MG) BY MOUTH TWICE DAILY 06/11/20   Lorretta Harp, MD  cetirizine (ZYRTEC) 10 MG tablet Take 1 tablet (10 mg total) by mouth once as needed for up to 1 dose for allergies or rhinitis. 09/13/20   Varney Biles, MD  CINNAMON PO Take 1,000 mg by mouth daily.     [provider]  Elastic Bandages & Supports (MEDICAL COMPRESSION STOCKINGS) MISC Knee High 20-30 mm compression stockings 03/02/18   Lorretta Harp, MD  enoxaparin (LOVENOX) 120 MG/0.8ML injection Inject 0.8 mLs (120 mg total) into the skin every 12 (twelve) hours. 04/15/20   Lorretta Harp, MD  ezetimibe (ZETIA) 10 MG tablet Take 1 tablet (10 mg total) by mouth daily. Patient taking differently: Take 10 mg by mouth every evening.  05/01/15   Lorretta Harp, MD  fluticasone (FLONASE) 50 MCG/ACT nasal spray Place 1 spray into both nostrils daily as needed for allergies or rhinitis.    [provider]  folic acid (FOLVITE) 1 MG tablet Take 1 mg by mouth daily.     [provider]  furosemide (LASIX) 80 MG tablet Take 1 tablet (80 mg total) by mouth daily. Call patient prior to filling, per patient request 08/05/20   Lorretta Harp, MD  losartan (COZAAR) 25 MG tablet Take 1 tablet (25 mg total) by mouth daily. 03/27/20   Almyra Deforest, PA  metFORMIN (GLUCOPHAGE) 1000 MG tablet Take 1,000 mg by mouth 2 (two) times daily with a meal.    [provider]  Misc Natural Products (IMMUNE FORMULA PO) Take 1 tablet by mouth daily. Immune: Vitamin C, D and Zinc. Chew 1 tablets daily.    [provider]  omeprazole (PRILOSEC) 40 MG capsule TAKE 1  CAPSULE(40 MG) BY MOUTH DAILY AS NEEDED FOR HEARTBURN Patient taking differently: Take 40 mg by mouth every evening.  02/06/19   Lorretta Harp, MD  pioglitazone (ACTOS) 45 MG tablet Take 45 mg by mouth daily.     [provider]  simvastatin (ZOCOR) 40 MG tablet Take 1 tablet (40 mg total) by mouth daily at 6 PM. Patient taking differently: Take 40 mg by mouth at bedtime.  04/12/16   Lorretta Harp, MD  traZODone (DESYREL) 100 MG tablet Take 100 mg by mouth at bedtime.  04/03/20   [provider]  warfarin (COUMADIN) 4 MG tablet Take 4 mg by mouth See admin instructions. Take Sunday and thursday 07/17/19   [provider]  warfarin (COUMADIN)  5 MG tablet Take 5 mg by mouth See admin instructions. Take on Monday, Tuesday, Wednesday, Friday and Saturday    [provider]    Allergies    Morphine and related  Review of Systems   Review of Systems  Constitutional: Positive for activity change.  HENT: Positive for congestion, postnasal drip, rhinorrhea and sore throat.   Respiratory: Positive for cough. Negative for shortness of breath.   Allergic/Immunologic: Negative for immunocompromised state.  All other systems reviewed and are negative.   Physical Exam Updated Vital Signs BP 126/68 (BP Location: Right Arm)   Pulse 74   Temp 97.9 F (36.6 C) (Oral)   Resp 19   Ht '5\' 6"'  (1.676 m)   Wt 104.3 kg   SpO2 95%   BMI 37.12 kg/m   Physical Exam Vitals and nursing note reviewed.  Constitutional:      Appearance: He is well-developed.  HENT:     Head: Atraumatic.  Eyes:     Extraocular Movements: Extraocular movements intact.     Pupils: Pupils are equal, round, and reactive to light.  Cardiovascular:     Rate and Rhythm: Normal rate.  Pulmonary:     Effort: Pulmonary effort is normal.     Breath sounds: No wheezing or rhonchi.  Musculoskeletal:     Cervical back: Neck supple.  Lymphadenopathy:     Cervical: No cervical adenopathy.    Skin:    General: Skin is warm.  Neurological:     Mental Status: He is alert and oriented to person, place, and time.     ED Results / Procedures / Treatments   Labs (all labs ordered are listed, but only abnormal results are displayed) Labs Reviewed  RESPIRATORY PANEL BY RT PCR (FLU A&B, COVID)    EKG None  Radiology DG Chest 2 View  Result Date: 09/13/2020 CLINICAL DATA:  Cough EXAM: CHEST - 2 VIEW COMPARISON:  07/03/2014 FINDINGS: The heart size and mediastinal contours are within normal limits. Both lungs are clear. The visualized skeletal structures are unremarkable. IMPRESSION: No active cardiopulmonary disease. Electronically Signed   By: Ulyses Jarred M.D.   On: 09/13/2020 01:04    Procedures Procedures (including critical care time)  Medications Ordered in ED Medications - No data to display  ED Course  I have reviewed the triage vital signs and the nursing notes.  Pertinent labs & imaging results that were available during my care of the patient were reviewed by me and considered in my medical decision making (see chart for details).    MDM Rules/Calculators/A&P                          Eduardo Butler was evaluated in Emergency Department on 09/13/2020 for the symptoms described in the history of present illness. He was evaluated in the context of the global COVID-19 pandemic, which necessitated consideration that the patient might be at risk for infection with the SARS-CoV-2 virus that causes COVID-19. Institutional protocols and algorithms that pertain to the evaluation of patients at risk for COVID-19 are in a state of rapid change based on information released by regulatory bodies including the CDC and federal and state organizations. These policies and algorithms were followed during the patient's care in the ED.   Patient comes in a chief complaint of flulike symptoms.  We did order COVID-19 test and he is negative for it and influenza.  His exam is  reassuring.  Chest  x-ray is clear.  He is stable for discharge.  Likely has URI that is not because of flu or Covid.  Doubt that this is a vaccine related side effect.  Final Clinical Impression(s) / ED Diagnoses Final diagnoses:  Viral URI    Rx / DC Orders ED Discharge Orders         Ordered    cetirizine (ZYRTEC) 10 MG tablet  Once PRN        09/13/20 0117           Varney Biles, MD 09/13/20 [phone]

## 2020-09-13 NOTE — Discharge Instructions (Signed)
You are seen in the ER for upper respiratory infection/flulike symptoms.  Your influenza test and COVID-19 test are negative.  Chest x-ray is clear.  We recommend that you take Tylenol/ibuprofen for body aches and Zyrtec for runny nose.

## 2020-09-25 DIAGNOSIS — E1149 Type 2 diabetes mellitus with other diabetic neurological complication: Secondary | ICD-10-CM | POA: Diagnosis not present

## 2020-09-25 DIAGNOSIS — M542 Cervicalgia: Secondary | ICD-10-CM | POA: Diagnosis not present

## 2020-09-25 DIAGNOSIS — E782 Mixed hyperlipidemia: Secondary | ICD-10-CM | POA: Diagnosis not present

## 2020-09-25 DIAGNOSIS — I10 Essential (primary) hypertension: Secondary | ICD-10-CM | POA: Diagnosis not present

## 2020-09-25 DIAGNOSIS — I2510 Atherosclerotic heart disease of native coronary artery without angina pectoris: Secondary | ICD-10-CM | POA: Diagnosis not present

## 2020-09-25 DIAGNOSIS — G4700 Insomnia, unspecified: Secondary | ICD-10-CM | POA: Diagnosis not present

## 2020-09-25 DIAGNOSIS — M25512 Pain in left shoulder: Secondary | ICD-10-CM | POA: Diagnosis not present

## 2020-09-29 ENCOUNTER — Other Ambulatory Visit: Payer: Self-pay | Admitting: Physical Medicine & Rehabilitation

## 2020-09-29 DIAGNOSIS — M5412 Radiculopathy, cervical region: Secondary | ICD-10-CM

## 2020-09-30 DIAGNOSIS — Z7901 Long term (current) use of anticoagulants: Secondary | ICD-10-CM | POA: Diagnosis not present

## 2020-10-04 ENCOUNTER — Other Ambulatory Visit: Payer: Self-pay

## 2020-10-04 ENCOUNTER — Ambulatory Visit (INDEPENDENT_AMBULATORY_CARE_PROVIDER_SITE_OTHER): Payer: Medicare Other

## 2020-10-04 DIAGNOSIS — M4802 Spinal stenosis, cervical region: Secondary | ICD-10-CM | POA: Diagnosis not present

## 2020-10-04 DIAGNOSIS — M5412 Radiculopathy, cervical region: Secondary | ICD-10-CM

## 2020-10-04 DIAGNOSIS — M47812 Spondylosis without myelopathy or radiculopathy, cervical region: Secondary | ICD-10-CM

## 2020-10-07 DIAGNOSIS — M542 Cervicalgia: Secondary | ICD-10-CM | POA: Diagnosis not present

## 2020-10-16 ENCOUNTER — Telehealth: Payer: Self-pay | Admitting: Cardiovascular Disease

## 2020-10-16 DIAGNOSIS — I4891 Unspecified atrial fibrillation: Secondary | ICD-10-CM | POA: Diagnosis not present

## 2020-10-16 DIAGNOSIS — R0600 Dyspnea, unspecified: Secondary | ICD-10-CM | POA: Diagnosis not present

## 2020-10-16 NOTE — Telephone Encounter (Signed)
Returned call to [facility] with [employer] in regards to patient.   She states patient was seen today and has new onset Afib and needs to be seen today or Monday.    Hinton Dyer unsure if patient symptomatic or other information.  Spoke with Dr. Olen Pel who reports patient presented today with c/o SOB x 6 weeks with intermittent low O2 readings at home (89%).  Patient reports feeling back to normal the last 3 days.    MD states exam was normal but EKG confirmed Afib-rate was normal.   He is already anticoagulated on coumadin.   He states this is not urgent but concerned about intermittent low oxygen readings and would like to get him back in with Dr. Gwenlyn Found soon.    Advised would contact patient to schedule follow up appt.

## 2020-10-16 NOTE — Telephone Encounter (Signed)
Hinton Dyer from [facility] called and stated that the patient needs to be seen today or Monday. Please call

## 2020-10-21 ENCOUNTER — Other Ambulatory Visit: Payer: Self-pay | Admitting: Cardiovascular Disease

## 2020-10-21 ENCOUNTER — Encounter: Payer: Self-pay | Admitting: Cardiovascular Disease

## 2020-10-21 ENCOUNTER — Other Ambulatory Visit: Payer: Self-pay

## 2020-10-21 ENCOUNTER — Telehealth: Payer: Self-pay | Admitting: Radiology

## 2020-10-21 ENCOUNTER — Ambulatory Visit (INDEPENDENT_AMBULATORY_CARE_PROVIDER_SITE_OTHER): Payer: Medicare Other | Admitting: Cardiovascular Disease

## 2020-10-21 VITALS — BP 128/70 | HR 61 | Ht 66.0 in | Wt 263.4 lb

## 2020-10-21 DIAGNOSIS — G4733 Obstructive sleep apnea (adult) (pediatric): Secondary | ICD-10-CM | POA: Insufficient documentation

## 2020-10-21 DIAGNOSIS — I10 Essential (primary) hypertension: Secondary | ICD-10-CM

## 2020-10-21 DIAGNOSIS — I25810 Atherosclerosis of coronary artery bypass graft(s) without angina pectoris: Secondary | ICD-10-CM | POA: Diagnosis not present

## 2020-10-21 DIAGNOSIS — E782 Mixed hyperlipidemia: Secondary | ICD-10-CM | POA: Diagnosis not present

## 2020-10-21 DIAGNOSIS — I480 Paroxysmal atrial fibrillation: Secondary | ICD-10-CM | POA: Diagnosis not present

## 2020-10-21 NOTE — Assessment & Plan Note (Signed)
History of essential hypertension with blood pressure measured today at 128/70.  He is on carvedilol and losartan.

## 2020-10-21 NOTE — Assessment & Plan Note (Signed)
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

## 2020-10-21 NOTE — Addendum Note (Signed)
Addended by: Fidel Levy on: 10/21/2020 01:37 PM   Modules accepted: Orders

## 2020-10-21 NOTE — Telephone Encounter (Signed)
Enrolled patient for a 14 day Zio XT to be mailed to patients home address of [address], [place]

## 2020-10-21 NOTE — Assessment & Plan Note (Signed)
History of hyperlipidemia on Zetia and simvastatin with lipid profile performed 08/28/2019 revealing total cholesterol of 120, LDL 60 and HDL 44.

## 2020-10-21 NOTE — Assessment & Plan Note (Signed)
Recent EKG performed by his PCP and his office on 10/16/2020 showed A. fib with controlled ventricular response.  His EKG today reveals sinus rhythm at a rate of 61.  He is on warfarin oral anticoagulation.  I am going to get a 2-week Zio patch to further evaluate

## 2020-10-21 NOTE — Assessment & Plan Note (Signed)
Eduardo Butler does have some nocturnal snoring and daytime somnolence with a body habitus consistent with obstructive sleep apnea.  Especially in light of his recent PAF I am going to get an outpatient sleep study to further evaluate.

## 2020-10-21 NOTE — Patient Instructions (Addendum)
Medication Instructions:  Your physician recommends that you continue on your current medications as directed. Please refer to the Current Medication list given to you today.  *If you need a refill on your cardiac medications before your next appointment, please call your pharmacy*   Lab Work: LIPID PANEL today  If you have labs (blood work) drawn today and your tests are completely normal, you will receive your results only by: [name] MyChart Message (if you have MyChart) OR . A paper copy in the mail If you have any lab test that is abnormal or we need to change your treatment, we will call you to review the results.   Testing/Procedures: Sleep Study @ [facility] will get a call to schedule once approved with insurance  ZIO Term Monitor Instructions   Your physician has requested you wear your ZIO patch monitor 14 days.   This is a single patch monitor.  [employer] supplies one patch monitor per enrollment.  Additional stickers are not available.   Please do not apply patch if you will be having a Nuclear Stress Test, Echocardiogram, Cardiac CT, MRI, or Chest Xray during the time frame you would be wearing the monitor. The patch cannot be worn during these tests.  You cannot remove and re-apply the ZIO XT patch monitor.   Your ZIO patch monitor will be sent USPS Priority mail from [facility] directly to your home address. The monitor may also be mailed to a PO BOX if home delivery is not available.   It may take 3-5 days to receive your monitor after you have been enrolled.   Once you have received you monitor, please review enclosed instructions.  Your monitor has already been registered assigning a specific monitor serial # to you.   Applying the monitor   Shave hair from upper left chest.   Hold abrader disc by orange tab.  Rub abrader in 40 strokes over left upper chest as indicated in your monitor instructions.   Clean area with 4 enclosed alcohol pads .  Use all  pads to assure are is cleaned thoroughly.  Let dry.   Apply patch as indicated in monitor instructions.  Patch will be place under collarbone on left side of chest with arrow pointing upward.   Rub patch adhesive wings for 2 minutes.Remove white label marked "1".  Remove white label marked "2".  Rub patch adhesive wings for 2 additional minutes.   While looking in a mirror, press and release button in center of patch.  A small green light will flash 3-4 times .  This will be your only indicator the monitor has been turned on.     Do not shower for the first 24 hours.  You may shower after the first 24 hours.   Press button if you feel a symptom. You will hear a small click.  Record Date, Time and Symptom in the Patient Log Book.   When you are ready to remove patch, follow instructions on last 2 pages of Patient Log Book.  Stick patch monitor onto last page of Patient Log Book.   Place Patient Log Book in [place] box.  Use locking tab on box and tape box closed securely.  The [employer] and [employer] has [employer] on it.  Please place in mailbox as soon as possible.  Your physician should have your test results approximately 7 days after the monitor has been mailed back to [facility].   Call [place] at  [phone] if you have questions regarding your ZIO XT patch monitor.  Call them immediately if you see an orange light blinking on your monitor.   If your monitor falls off in less than 4 days contact our Monitor department at [phone].  If your monitor becomes loose or falls off after 4 days call [employer] at [phone] for suggestions on securing your monitor.     Follow-Up: At [facility], you and your health needs are our priority.  As part of our continuing mission to provide you with exceptional heart care, we have created designated Provider Care Teams.  These Care Teams include your primary Cardiologist (physician) and Advanced Practice Providers  (APPs -  Physician Assistants and Nurse Practitioners) who all work together to provide you with the care you need, when you need it.  We recommend signing up for the patient portal called "MyChart".  Sign up information is provided on this After Visit Summary.  MyChart is used to connect with patients for Virtual Visits (Telemedicine).  Patients are able to view lab/test results, encounter notes, upcoming appointments, etc.  Non-urgent messages can be sent to your provider as well.   To learn more about what you can do with MyChart, go to [email or web address].    Your next appointment:   3 month(s)  The format for your next appointment:   In Person  Provider:   You may see Quay Burow, MD or one of the following Advanced Practice Providers on your designated Care Team:    Kerin Ransom, PA-C  [place], [place]  Coletta Memos, [place]    Other Instructions

## 2020-10-21 NOTE — Progress Notes (Signed)
10/21/2020 Eduardo Butler   1950-12-03  [id number]  Primary Physician Orpah Melter, MD Primary Cardiologist: Lorretta Harp MD FACP, [place], [place], [place]  HPI:  Eduardo Butler is a 69 y.o.  moderately overweight married Caucasian male, father of 2, grandfather to 2 grandchildren who I last saw in [facility] 04/15/2020.Marland KitchenHe is accompanied by his wife [name] today. 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 [name] follows. His other problems include hyperlipidemia and erectile dysfunction. I catheterized him at the [facility] September 28th , 2007 revealing patent grafts with normal LV function .  This suggested that he had a false positive Myoview stress test and medical therapy was recommended.  His last Myoview performed 01/04/13 was nonischemic Dr. Doyle Askew follows his lipid profile. When I saw him 2 months ago he was complaining of dyspnea on exertion. He saw one of our PAs a month later with increasing dyspnea on exertion. His diastolic was cut in half but remains bradycardic although he was clinically improved. His when necessary hydrochlorothiazide was changed to when necessary Lasix which she took over the weekend for several doses which resulted in improved dyspnea and decreased edema. I did discuss with him today so restriction. A 2-D echo was ordered that showed normal FUNCTION in a Myoview stress test that showed apical thinning with new ischemia in the inferior wall compared to a previous Myoview 2 years ago. Since I saw him3 months ago he continued to do well. He says he is stronger. I did adjust his diuretics which improved his lower extremity edema. He also  admits to dietary indiscretion. He denies chest pain does have chronic dyspnea on exertion.  He hasseen HaoMeng PA-Cin the office 03/29/2020... His major complaint is of dyspnea. He has not lost any weight. He denies chest pain. An echo performed 03/07/2018 was completely normal.  He has been more dyspneic and weaker since I saw him back in December.  A Myoview stress test ordered by Almyra Deforest 04/08/2020 showed subtle anterior ischemia only mildly different from the last Myoview performed 7/17.  However, given the fact that his last cath was 14 years ago I decided to perform outpatient cardiac catheterization on 04/23/2020 revealing patent grafts.  2D echo revealed normal LV function.  Since I saw him 6 months ago he was seen in the ER last month with dyspnea with negative work-up.  Chest x-ray showed no active disease.  He saw Dr. Doyle Askew in the office on 10/16/2020 and an EKG on that date did show atrial fibrillation.  He remains on warfarin anticoagulation.  His hemoglobin was apparently in the mid 12 range.  Current Meds  Medication Sig  . acetaminophen (TYLENOL) 500 MG tablet Take 1,000-1,500 mg by mouth every 6 (six) hours as needed for moderate pain.  [name] albuterol (VENTOLIN HFA) 108 (90 Base) MCG/ACT inhaler Inhale 2 puffs into the lungs every 6 (six) hours as needed for shortness of breath.   . ALPRAZolam (XANAX) 0.5 MG tablet Take 0.5 mg by mouth daily as needed for anxiety or sleep.   . Ascorbic Acid (VITAMIN C) 1000 MG tablet Take 1,000 mg by mouth daily.  [name] aspirin EC  81 MG tablet Take 81 mg by mouth daily.  . carvedilol (COREG) 3.125 MG tablet TAKE 1 TABLET(3.125 MG) BY MOUTH TWICE DAILY  . cetirizine (ZYRTEC) 10 MG tablet Take 1 tablet (10 mg total) by mouth once as needed for up to 1 dose for allergies or rhinitis.  [name] CINNAMON PO Take 1,000 mg by mouth daily.   Water engineer Bandages & Supports (MEDICAL COMPRESSION STOCKINGS) MISC Knee High 20-30 mm compression stockings  . ezetimibe  (ZETIA) 10 MG tablet Take 1 tablet (10 mg total) by mouth daily.  . fluticasone (FLONASE) 50 MCG/ACT nasal spray Place 1 spray into both nostrils daily as needed for allergies or rhinitis.  . folic acid (FOLVITE) 1 MG tablet Take 1 mg by mouth daily.   . furosemide (LASIX) 80 MG tablet Take 1 tablet (80 mg total) by mouth daily. Call patient prior to filling, per patient request  . losartan (COZAAR) 25 MG tablet Take 1 tablet (25 mg total) by mouth daily.  . metFORMIN (GLUCOPHAGE) 1000 MG tablet Take 1,000 mg by mouth 2 (two) times daily with a meal.  . Misc Natural Products (IMMUNE FORMULA PO) Take 1 tablet by mouth daily. Immune: Vitamin C, D and Zinc. Chew 1 tablets daily.  [name] omeprazole (PRILOSEC) 40 MG capsule TAKE 1 CAPSULE(40 MG) BY MOUTH DAILY AS NEEDED FOR HEARTBURN  . pioglitazone (ACTOS) 45 MG tablet Take 45 mg by mouth daily.   . simvastatin (ZOCOR) 40 MG tablet Take 1 tablet (40 mg total) by mouth daily at 6 PM.  . traZODone (DESYREL) 100 MG tablet Take 100 mg by mouth at bedtime.   [name] warfarin (COUMADIN) 4 MG tablet Take 4 mg by mouth See admin instructions. Take Sunday and thursday  . warfarin (COUMADIN) 5 MG tablet Take 5 mg by mouth See admin instructions. Take on Monday, Tuesday, Wednesday, Friday and Saturday   Current Facility-Administered Medications for the 10/21/20 encounter (Office Visit) with Lorretta Harp, MD  Medication  . sodium chloride flush (NS) 0.9 % injection 3 mL     Allergies  Allergen Reactions  . Morphine And Related     hallucinations    Social History   Socioeconomic History  . Marital status: Married    Spouse name: Not on file  . Number of children: Not on file  . Years of education: Not on file  . Highest education level: Not on file  Occupational History  . Not on file  Tobacco Use  . Smoking status: Never Smoker  . Smokeless tobacco: Never Used  Substance and Sexual Activity  . Alcohol use: No    Comment: none since 1979  . Drug  use: No  . Sexual activity: Not on file  Other Topics Concern  . Not on file  Social History Narrative  . Not on file   Social Determinants of Health   Financial Resource Strain:   . Difficulty of Paying Living Expenses: Not on file  Food Insecurity:   . Worried About Charity fundraiser in the Last Year: Not on file  . Ran Out of Food in the Last Year: Not on file  Transportation Needs:   . Lack of Transportation (Medical): Not on file  . Lack of Transportation (Non-Medical): Not on file  Physical Activity:   . Days of Exercise per Week: Not on file  . Minutes of Exercise per Session: Not on file  Stress:   . Feeling of Stress : Not on file  Social  Connections:   . Frequency of Communication with Friends and Family: Not on file  . Frequency of Social Gatherings with Friends and Family: Not on file  . Attends Religious Services: Not on file  . Active Member of Clubs or Organizations: Not on file  . Attends Archivist Meetings: Not on file  . Marital Status: Not on file  Intimate Partner Violence:   . Fear of Current or Ex-Partner: Not on file  . Emotionally Abused: Not on file  . Physically Abused: Not on file  . Sexually Abused: Not on file     Review of Systems: General: negative for chills, fever, night sweats or weight changes.  Cardiovascular: negative for chest pain, dyspnea on exertion, edema, orthopnea, palpitations, paroxysmal nocturnal dyspnea or shortness of breath Dermatological: negative for rash Respiratory: negative for cough or wheezing Urologic: negative for hematuria Abdominal: negative for nausea, vomiting, diarrhea, bright red blood per rectum, melena, or hematemesis Neurologic: negative for visual changes, syncope, or dizziness All other systems reviewed and are otherwise negative except as noted above.    Blood pressure 128/70, pulse 61, height 5\' 6"  (1.676 m), weight 263 lb 6.4 oz (119.5 kg).  General appearance: alert and no  distress Neck: no adenopathy, no carotid bruit, no JVD, supple, symmetrical, trachea midline and thyroid not enlarged, symmetric, no tenderness/mass/nodules Lungs: clear to auscultation bilaterally Heart: regular rate and rhythm, S1, S2 normal, no murmur, click, rub or gallop Extremities: extremities normal, atraumatic, no cyanosis or edema Pulses: 2+ and symmetric Skin: Skin color, texture, turgor normal. No rashes or lesions Neurologic: Alert and oriented X 3, normal strength and tone. Normal symmetric reflexes. Normal coordination and gait  EKG sinus rhythm at 61 with low limb voltage and nonspecific ST and T wave changes.  I personally reviewed this EKG.  ASSESSMENT AND PLAN:   Hyperlipidemia History of hyperlipidemia on Zetia and simvastatin with lipid profile performed 08/28/2019 revealing total cholesterol of 120, LDL 60 and HDL 44.  PULMONARY EMBOLISM AND INFARCTION History of pulmonary embolism and DVT in the past with lupus anticoagulant on warfarin  Essential hypertension History of essential hypertension with blood pressure measured today at 128/70.  He is on carvedilol and losartan.  Coronary artery disease History of CAD status post coronary artery bypass grafting x4 March 2003 with a LIMA to his LAD, free radial to the circumflex marginal and sequential vein to an acute marginal and to the distal RCA.  I recently performed coronary angiography on him because of subtle ischemia seen on Myoview stress testing 04/08/2020 done in the evaluation of dyspnea.  Cath performed 04/23/2020 revealed patent grafts and 2D echo revealed normal LV systolic function with normal diastolic function.  He denies chest pain.  He is chronically dyspneic.  Paroxysmal atrial fibrillation (HCC) Recent EKG performed by his PCP and his office on 10/16/2020 showed A. fib with controlled ventricular response.  His EKG today reveals sinus rhythm at a rate of 61.  He is on warfarin oral anticoagulation.  I am  going to get a 2-week Zio patch to further evaluate  Obstructive sleep apnea Mr. Vanecek does have some nocturnal snoring and daytime somnolence with a body habitus consistent with obstructive sleep apnea.  Especially in light of his recent PAF I am going to get an outpatient sleep study to further evaluate.      Lorretta Harp MD FACP,FACC,FAHA, [facility] 10/21/2020 11:43 AM

## 2020-10-21 NOTE — Assessment & Plan Note (Signed)
History of CAD status post coronary artery bypass grafting x4 March 2003 with a LIMA to his LAD, free radial to the circumflex marginal and sequential vein to an acute marginal and to the distal RCA.  I recently performed coronary angiography on him because of subtle ischemia seen on Myoview stress testing 04/08/2020 done in the evaluation of dyspnea.  Cath performed 04/23/2020 revealed patent grafts and 2D echo revealed normal LV systolic function with normal diastolic function.  He denies chest pain.  He is chronically dyspneic.

## 2020-10-22 LAB — LIPID PANEL
Chol/HDL Ratio: 2.7 ratio (ref 0.0–5.0)
Cholesterol, Total: 117 mg/dL (ref 100–199)
HDL: 44 mg/dL
LDL Chol Calc (NIH): 59 mg/dL (ref 0–99)
Triglycerides: 67 mg/dL (ref 0–149)
VLDL Cholesterol Cal: 14 mg/dL (ref 5–40)

## 2020-10-22 LAB — HEPATIC FUNCTION PANEL
ALT: 17 IU/L (ref 0–44)
AST: 15 IU/L (ref 0–40)
Albumin: 4 g/dL (ref 3.8–4.8)
Alkaline Phosphatase: 55 IU/L (ref 44–121)
Bilirubin Total: 0.4 mg/dL (ref 0.0–1.2)
Bilirubin, Direct: 0.16 mg/dL (ref 0.00–0.40)
Total Protein: 6.3 g/dL (ref 6.0–8.5)

## 2020-10-27 ENCOUNTER — Ambulatory Visit (INDEPENDENT_AMBULATORY_CARE_PROVIDER_SITE_OTHER): Payer: Medicare Other

## 2020-10-27 DIAGNOSIS — M47892 Other spondylosis, cervical region: Secondary | ICD-10-CM | POA: Diagnosis not present

## 2020-10-27 DIAGNOSIS — I480 Paroxysmal atrial fibrillation: Secondary | ICD-10-CM

## 2020-10-27 DIAGNOSIS — M5186 Other intervertebral disc disorders, lumbar region: Secondary | ICD-10-CM | POA: Diagnosis not present

## 2020-10-28 ENCOUNTER — Telehealth: Payer: Self-pay | Admitting: Cardiology

## 2020-10-28 NOTE — Telephone Encounter (Signed)
No PA required.  Called patient and told him he is scheduled for Thursday, January 13 at 8 pm, to expect info packet and left the sleep lab number with him.

## 2020-10-29 DIAGNOSIS — M5186 Other intervertebral disc disorders, lumbar region: Secondary | ICD-10-CM | POA: Diagnosis not present

## 2020-10-29 DIAGNOSIS — M47892 Other spondylosis, cervical region: Secondary | ICD-10-CM | POA: Diagnosis not present

## 2020-11-02 DIAGNOSIS — M5186 Other intervertebral disc disorders, lumbar region: Secondary | ICD-10-CM | POA: Diagnosis not present

## 2020-11-02 DIAGNOSIS — M47892 Other spondylosis, cervical region: Secondary | ICD-10-CM | POA: Diagnosis not present

## 2020-11-05 DIAGNOSIS — M5186 Other intervertebral disc disorders, lumbar region: Secondary | ICD-10-CM | POA: Diagnosis not present

## 2020-11-05 DIAGNOSIS — M47892 Other spondylosis, cervical region: Secondary | ICD-10-CM | POA: Diagnosis not present

## 2020-11-09 DIAGNOSIS — I4891 Unspecified atrial fibrillation: Secondary | ICD-10-CM | POA: Diagnosis not present

## 2020-11-09 DIAGNOSIS — Z7901 Long term (current) use of anticoagulants: Secondary | ICD-10-CM | POA: Diagnosis not present

## 2020-11-10 DIAGNOSIS — I4891 Unspecified atrial fibrillation: Secondary | ICD-10-CM | POA: Diagnosis not present

## 2020-11-10 DIAGNOSIS — I2510 Atherosclerotic heart disease of native coronary artery without angina pectoris: Secondary | ICD-10-CM | POA: Diagnosis not present

## 2020-11-10 DIAGNOSIS — E782 Mixed hyperlipidemia: Secondary | ICD-10-CM | POA: Diagnosis not present

## 2020-11-10 DIAGNOSIS — K219 Gastro-esophageal reflux disease without esophagitis: Secondary | ICD-10-CM | POA: Diagnosis not present

## 2020-11-10 DIAGNOSIS — M47892 Other spondylosis, cervical region: Secondary | ICD-10-CM | POA: Diagnosis not present

## 2020-11-10 DIAGNOSIS — I10 Essential (primary) hypertension: Secondary | ICD-10-CM | POA: Diagnosis not present

## 2020-11-10 DIAGNOSIS — G4700 Insomnia, unspecified: Secondary | ICD-10-CM | POA: Diagnosis not present

## 2020-11-10 DIAGNOSIS — M5186 Other intervertebral disc disorders, lumbar region: Secondary | ICD-10-CM | POA: Diagnosis not present

## 2020-11-10 DIAGNOSIS — E1149 Type 2 diabetes mellitus with other diabetic neurological complication: Secondary | ICD-10-CM | POA: Diagnosis not present

## 2020-11-12 DIAGNOSIS — M47892 Other spondylosis, cervical region: Secondary | ICD-10-CM | POA: Diagnosis not present

## 2020-11-12 DIAGNOSIS — M5186 Other intervertebral disc disorders, lumbar region: Secondary | ICD-10-CM | POA: Diagnosis not present

## 2020-11-17 DIAGNOSIS — I480 Paroxysmal atrial fibrillation: Secondary | ICD-10-CM | POA: Diagnosis not present

## 2020-11-26 ENCOUNTER — Other Ambulatory Visit: Payer: Self-pay | Admitting: Physician Assistant

## 2020-11-26 NOTE — Telephone Encounter (Signed)
*  STAT* If patient is at the pharmacy, call can be transferred to refill team.   1. Which medications need to be refilled? (please list name of each medication and dose if known) new prescription- Losartan- [place] is in [place]- wife in the hospital   2. Which pharmacy/location (including street and city if local pharmacy) is medication to be sent to?[facility] [place]  3. Do they need a 30 day or 90 day supply? #12

## 2020-12-10 ENCOUNTER — Encounter (HOSPITAL_BASED_OUTPATIENT_CLINIC_OR_DEPARTMENT_OTHER): Payer: Medicare Other | Admitting: Cardiovascular Disease

## 2020-12-23 DIAGNOSIS — Z7901 Long term (current) use of anticoagulants: Secondary | ICD-10-CM | POA: Diagnosis not present

## 2020-12-26 DIAGNOSIS — I10 Essential (primary) hypertension: Secondary | ICD-10-CM | POA: Diagnosis not present

## 2020-12-26 DIAGNOSIS — I2510 Atherosclerotic heart disease of native coronary artery without angina pectoris: Secondary | ICD-10-CM | POA: Diagnosis not present

## 2020-12-26 DIAGNOSIS — E1149 Type 2 diabetes mellitus with other diabetic neurological complication: Secondary | ICD-10-CM | POA: Diagnosis not present

## 2020-12-26 DIAGNOSIS — E782 Mixed hyperlipidemia: Secondary | ICD-10-CM | POA: Diagnosis not present

## 2020-12-26 DIAGNOSIS — K219 Gastro-esophageal reflux disease without esophagitis: Secondary | ICD-10-CM | POA: Diagnosis not present

## 2020-12-26 DIAGNOSIS — G4700 Insomnia, unspecified: Secondary | ICD-10-CM | POA: Diagnosis not present

## 2020-12-26 DIAGNOSIS — I4891 Unspecified atrial fibrillation: Secondary | ICD-10-CM | POA: Diagnosis not present

## 2021-01-01 ENCOUNTER — Ambulatory Visit (INDEPENDENT_AMBULATORY_CARE_PROVIDER_SITE_OTHER): Payer: Medicare Other | Admitting: Cardiovascular Disease

## 2021-01-01 ENCOUNTER — Encounter: Payer: Self-pay | Admitting: Cardiovascular Disease

## 2021-01-01 ENCOUNTER — Other Ambulatory Visit: Payer: Self-pay

## 2021-01-01 VITALS — BP 102/56 | HR 65 | Ht 66.0 in | Wt 256.0 lb

## 2021-01-01 DIAGNOSIS — I10 Essential (primary) hypertension: Secondary | ICD-10-CM

## 2021-01-01 DIAGNOSIS — E782 Mixed hyperlipidemia: Secondary | ICD-10-CM

## 2021-01-01 DIAGNOSIS — I25810 Atherosclerosis of coronary artery bypass graft(s) without angina pectoris: Secondary | ICD-10-CM | POA: Diagnosis not present

## 2021-01-01 DIAGNOSIS — I480 Paroxysmal atrial fibrillation: Secondary | ICD-10-CM

## 2021-01-01 NOTE — Assessment & Plan Note (Signed)
Recent event monitor showed predominantly sinus rhythm however there were runs of SVT, and SVT and PAF with slow ventricular response.  He is already on warfarin.  No other changes need to be made.  He does have normal LV function and widely patent grafts by cath last year.

## 2021-01-01 NOTE — Progress Notes (Signed)
01/01/2021 Eduardo Butler   [birth year]  [id number]  Primary Physician Orpah Melter, MD Primary Cardiologist: Lorretta Harp MD FACP, [place], [place], [place]  HPI:  Eduardo Butler is a 70 y.o.  moderately overweight married Caucasian male, father of 2, grandfather to 2 grandchildren who I last saw in [facility]  10/21/2020.Marland KitchenHe is accompanied by his wife [name] today. He has a history of CAD status post coronary artery bypass grafting x4 in March of 2003with a LIMA to the LAD, free radial to the circumflex marginal and sequential vein to the acute marginal of the right coronary artery and distal RCA.[name] He had recurrent pulmonary emboli thought to be secondary to lupus anticoagulant on life-long Coumadin anticoagulation which Dr. [name] follows. His other problems include hyperlipidemia and erectile dysfunction. I catheterized him at the [facility], 2007 revealing patent grafts with normal LV function .This suggested that he had a false positive Myoview stress test and medical therapy was recommended. His last Myoview performed 01/04/13 was nonischemic Dr. Doyle Askew follows his lipid profile. When I saw him 2 months ago he was complaining of dyspnea on exertion. He saw one of our PAs a month later with increasing dyspnea on exertion. His diastolic was cut in half but remains bradycardic although he was clinically improved. His when necessary hydrochlorothiazide was changed to when necessary Lasix which she took over the weekend for several doses which resulted in improved dyspnea and decreased edema. I did discuss with him today so restriction. A 2-D echo was ordered that showed normal FUNCTION in a Myoview stress test that showed apical thinning with new ischemia in the inferior wall compared to a previous Myoview 2 years ago. Since I saw him3 months ago he continued to do well. He says he is stronger. I did adjust his diuretics which improved his lower extremity edema. He also  admits to dietary indiscretion. He denies chest pain does have chronic dyspnea on exertion.  He hasseen HaoMeng PA-Cin the office5/12/2019... His major complaint is of dyspnea. He has not lost any weight. He denies chest pain. An echo performed 03/07/2018 was completely normal.He has been more dyspneic and weaker since I saw him back in December. A Myoview stress test ordered by Almyra Deforest 04/08/2020 showed subtle anterior ischemia only mildly different from the last Myoview performed 7/17. However, given the fact that his last cath was 14 years ago I decided to perform outpatient cardiac catheterization on 04/23/2020 revealing patent grafts.  2D echo revealed normal LV function.  He  was seen in the ER 09/13/2020 with dyspnea with negative work-up.  Chest x-ray showed no active disease.  He saw Dr. Doyle Askew in the office on 10/16/2020 and an EKG on that date did show atrial fibrillation.  He remains on warfarin anticoagulation.  His hemoglobin was apparently in the mid 12 range.  Since I saw him 3 months ago I did do an event monitor that showed predominantly sinus rhythm with PACs, PVCs, short runs of SVT and not as VT as well as runs of PAF with slow ventricular response.  He is on warfarin oral anticoagulation.   Current Meds  Medication Sig  . acetaminophen (TYLENOL) 500 MG tablet Take 1,000-1,500 mg by mouth every 6 (six) hours as needed for moderate pain.  [name] albuterol (VENTOLIN HFA) 108 (90 Base) MCG/ACT inhaler Inhale 2 puffs into the lungs every 6 (six) hours as needed for shortness of breath.   . ALPRAZolam (XANAX) 0.5 MG tablet Take  0.5 mg by mouth daily as needed for anxiety or sleep.   . Ascorbic Acid (VITAMIN C) 1000 MG tablet Take 1,000 mg by mouth daily.  [name] aspirin EC 81 MG tablet Take 81 mg by mouth daily.  . carvedilol (COREG) 3.125 MG tablet TAKE 1 TABLET(3.125 MG) BY MOUTH TWICE DAILY  . cetirizine (ZYRTEC) 10 MG tablet Take 1 tablet (10 mg total) by mouth once as needed  for up to 1 dose for allergies or rhinitis.  [name] CINNAMON PO Take 1,000 mg by mouth daily.   Water engineer Bandages & Supports (MEDICAL COMPRESSION STOCKINGS) MISC Knee High 20-30 mm compression stockings  . ezetimibe (ZETIA) 10 MG tablet Take 1 tablet (10 mg total) by mouth daily.  . fluticasone (FLONASE) 50 MCG/ACT nasal spray Place 1 spray into both nostrils daily as needed for allergies or rhinitis.  . folic acid (FOLVITE) 1 MG tablet Take 1 mg by mouth daily.  . furosemide (LASIX) 80 MG tablet Take 1 tablet (80 mg total) by mouth daily. Call patient prior to filling, per patient request  . gabapentin (NEURONTIN) 300 MG capsule Take 600 mg by mouth 3 (three) times daily.  [name] HYDROcodone-acetaminophen (NORCO/VICODIN) 5-325 MG tablet Take 1 tablet by mouth every 6 (six) hours as needed.  [name] losartan (COZAAR) 25 MG tablet TAKE 1 TABLET(25 MG) BY MOUTH DAILY  . metFORMIN (GLUCOPHAGE) 1000 MG tablet Take 1,000 mg by mouth 2 (two) times daily with a meal.  . Misc Natural Products (IMMUNE FORMULA PO) Take 1 tablet by mouth daily. Immune: Vitamin C, D and Zinc. Chew 1 tablets daily.  [name] omeprazole (PRILOSEC) 40 MG capsule TAKE ONE CAPSULE BY MOUTH DAILY AS NEEDED FOR HEARTBURN  . pioglitazone (ACTOS) 45 MG tablet Take 45 mg by mouth daily.   . simvastatin (ZOCOR) 40 MG tablet Take 1 tablet (40 mg total) by mouth daily at 6 PM.  . traZODone (DESYREL) 100 MG tablet Take 100 mg by mouth at bedtime.   [name] warfarin (COUMADIN) 4 MG tablet Take 4 mg by mouth See admin instructions. Take Sunday and thursday  . warfarin (COUMADIN) 5 MG tablet Take 5 mg by mouth See admin instructions. Take on Monday, Tuesday, Wednesday, Friday and Saturday   Current Facility-Administered Medications for the 01/01/21 encounter (Office Visit) with Lorretta Harp, MD  Medication  . sodium chloride flush (NS) 0.9 % injection 3 mL     Allergies  Allergen Reactions  . Morphine And Related     hallucinations    Social History    Socioeconomic History  . Marital status: Married    Spouse name: Not on file  . Number of children: Not on file  . Years of education: Not on file  . Highest education level: Not on file  Occupational History  . Not on file  Tobacco Use  . Smoking status: Never Smoker  . Smokeless tobacco: Never Used  Substance and Sexual Activity  . Alcohol use: No    Comment: none since 1979  . Drug use: No  . Sexual activity: Not on file  Other Topics Concern  . Not on file  Social History Narrative  . Not on file   Social Determinants of Health   Financial Resource Strain: Not on file  Food Insecurity: Not on file  Transportation Needs: Not on file  Physical Activity: Not on file  Stress: Not on file  Social Connections: Not on file  Intimate Partner Violence: Not on file  Review of Systems: General: negative for chills, fever, night sweats or weight changes.  Cardiovascular: negative for chest pain, dyspnea on exertion, edema, orthopnea, palpitations, paroxysmal nocturnal dyspnea or shortness of breath Dermatological: negative for rash Respiratory: negative for cough or wheezing Urologic: negative for hematuria Abdominal: negative for nausea, vomiting, diarrhea, bright red blood per rectum, melena, or hematemesis Neurologic: negative for visual changes, syncope, or dizziness All other systems reviewed and are otherwise negative except as noted above.    Blood pressure (!) 102/56, pulse 65, height 5\' 6"  (1.676 m), weight 256 lb (116.1 kg).  General appearance: alert and no distress Neck: no adenopathy, no carotid bruit, no JVD, supple, symmetrical, trachea midline and thyroid not enlarged, symmetric, no tenderness/mass/nodules Lungs: clear to auscultation bilaterally Heart: regular rate and rhythm, S1, S2 normal, no murmur, click, rub or gallop Extremities: extremities normal, atraumatic, no cyanosis or edema Pulses: 2+ and symmetric Skin: Skin color, texture, turgor  normal. No rashes or lesions Neurologic: Alert and oriented X 3, normal strength and tone. Normal symmetric reflexes. Normal coordination and gait  EKG sinus rhythm at 65 without ST or T wave changes.  Personally reviewed this EKG.  ASSESSMENT AND PLAN:   Paroxysmal atrial fibrillation (HCC) Recent event monitor showed predominantly sinus rhythm however there were runs of SVT, and SVT and PAF with slow ventricular response.  He is already on warfarin.  No other changes need to be made.  He does have normal LV function and widely patent grafts by cath last year.      Lorretta Harp MD [place], [facility] 01/01/2021 4:02 PM

## 2021-01-01 NOTE — Patient Instructions (Addendum)
Medication Instructions:  Your physician recommends that you continue on your current medications as directed. Please refer to the Current Medication list given to you today.  *If you need a refill on your cardiac medications before your next appointment, please call your pharmacy*   Follow-Up: At [facility], you and your health needs are our priority.  As part of our continuing mission to provide you with exceptional heart care, we have created designated Provider Care Teams.  These Care Teams include your primary Cardiologist (physician) and Advanced Practice Providers (APPs -  Physician Assistants and Nurse Practitioners) who all work together to provide you with the care you need, when you need it.  We recommend signing up for the patient portal called "MyChart".  Sign up information is provided on this After Visit Summary.  MyChart is used to connect with patients for Virtual Visits (Telemedicine).  Patients are able to view lab/test results, encounter notes, upcoming appointments, etc.  Non-urgent messages can be sent to your provider as well.   To learn more about what you can do with MyChart, go to [email or web address].    Your next appointment:   6 month(s)  The format for your next appointment:   In Person  Provider:   You will see one of the following Advanced Practice Providers on your designated Care Team:   Hao Meng, PA-C  Then, Jonathan Berry, MD will plan to see you again in 12 month(s). 

## 2021-01-08 DIAGNOSIS — M5186 Other intervertebral disc disorders, lumbar region: Secondary | ICD-10-CM | POA: Diagnosis not present

## 2021-01-08 DIAGNOSIS — M47892 Other spondylosis, cervical region: Secondary | ICD-10-CM | POA: Diagnosis not present

## 2021-01-12 DIAGNOSIS — M47892 Other spondylosis, cervical region: Secondary | ICD-10-CM | POA: Diagnosis not present

## 2021-01-12 DIAGNOSIS — M5186 Other intervertebral disc disorders, lumbar region: Secondary | ICD-10-CM | POA: Diagnosis not present

## 2021-01-15 DIAGNOSIS — M5186 Other intervertebral disc disorders, lumbar region: Secondary | ICD-10-CM | POA: Diagnosis not present

## 2021-01-15 DIAGNOSIS — M47892 Other spondylosis, cervical region: Secondary | ICD-10-CM | POA: Diagnosis not present

## 2021-01-19 ENCOUNTER — Ambulatory Visit: Payer: Medicare Other | Admitting: Cardiovascular Disease

## 2021-01-19 DIAGNOSIS — M47892 Other spondylosis, cervical region: Secondary | ICD-10-CM | POA: Diagnosis not present

## 2021-01-19 DIAGNOSIS — M5186 Other intervertebral disc disorders, lumbar region: Secondary | ICD-10-CM | POA: Diagnosis not present

## 2021-01-22 DIAGNOSIS — M47892 Other spondylosis, cervical region: Secondary | ICD-10-CM | POA: Diagnosis not present

## 2021-01-22 DIAGNOSIS — M5186 Other intervertebral disc disorders, lumbar region: Secondary | ICD-10-CM | POA: Diagnosis not present

## 2021-01-25 DIAGNOSIS — M5186 Other intervertebral disc disorders, lumbar region: Secondary | ICD-10-CM | POA: Diagnosis not present

## 2021-01-25 DIAGNOSIS — M47892 Other spondylosis, cervical region: Secondary | ICD-10-CM | POA: Diagnosis not present

## 2021-01-29 DIAGNOSIS — M47892 Other spondylosis, cervical region: Secondary | ICD-10-CM | POA: Diagnosis not present

## 2021-01-29 DIAGNOSIS — M5186 Other intervertebral disc disorders, lumbar region: Secondary | ICD-10-CM | POA: Diagnosis not present

## 2021-02-02 DIAGNOSIS — M47892 Other spondylosis, cervical region: Secondary | ICD-10-CM | POA: Diagnosis not present

## 2021-02-02 DIAGNOSIS — M5186 Other intervertebral disc disorders, lumbar region: Secondary | ICD-10-CM | POA: Diagnosis not present

## 2021-02-02 DIAGNOSIS — Z7901 Long term (current) use of anticoagulants: Secondary | ICD-10-CM | POA: Diagnosis not present

## 2021-02-03 DIAGNOSIS — H25813 Combined forms of age-related cataract, bilateral: Secondary | ICD-10-CM | POA: Diagnosis not present

## 2021-02-03 DIAGNOSIS — E119 Type 2 diabetes mellitus without complications: Secondary | ICD-10-CM | POA: Diagnosis not present

## 2021-02-03 DIAGNOSIS — H02834 Dermatochalasis of left upper eyelid: Secondary | ICD-10-CM | POA: Diagnosis not present

## 2021-02-03 DIAGNOSIS — H52213 Irregular astigmatism, bilateral: Secondary | ICD-10-CM | POA: Diagnosis not present

## 2021-02-03 DIAGNOSIS — H02831 Dermatochalasis of right upper eyelid: Secondary | ICD-10-CM | POA: Diagnosis not present

## 2021-02-03 DIAGNOSIS — H18503 Unspecified hereditary corneal dystrophies, bilateral: Secondary | ICD-10-CM | POA: Diagnosis not present

## 2021-02-03 DIAGNOSIS — H527 Unspecified disorder of refraction: Secondary | ICD-10-CM | POA: Diagnosis not present

## 2021-02-04 DIAGNOSIS — M47892 Other spondylosis, cervical region: Secondary | ICD-10-CM | POA: Diagnosis not present

## 2021-02-04 DIAGNOSIS — M5186 Other intervertebral disc disorders, lumbar region: Secondary | ICD-10-CM | POA: Diagnosis not present

## 2021-03-17 DIAGNOSIS — Z86718 Personal history of other venous thrombosis and embolism: Secondary | ICD-10-CM | POA: Diagnosis not present

## 2021-03-17 DIAGNOSIS — Z7901 Long term (current) use of anticoagulants: Secondary | ICD-10-CM | POA: Diagnosis not present

## 2021-03-23 DIAGNOSIS — Z79899 Other long term (current) drug therapy: Secondary | ICD-10-CM | POA: Diagnosis not present

## 2021-03-23 DIAGNOSIS — H527 Unspecified disorder of refraction: Secondary | ICD-10-CM | POA: Diagnosis not present

## 2021-03-23 DIAGNOSIS — H25813 Combined forms of age-related cataract, bilateral: Secondary | ICD-10-CM | POA: Diagnosis not present

## 2021-03-23 DIAGNOSIS — H25812 Combined forms of age-related cataract, left eye: Secondary | ICD-10-CM | POA: Diagnosis not present

## 2021-03-23 DIAGNOSIS — Z833 Family history of diabetes mellitus: Secondary | ICD-10-CM | POA: Diagnosis not present

## 2021-03-23 DIAGNOSIS — E1136 Type 2 diabetes mellitus with diabetic cataract: Secondary | ICD-10-CM | POA: Diagnosis not present

## 2021-03-23 DIAGNOSIS — K219 Gastro-esophageal reflux disease without esophagitis: Secondary | ICD-10-CM | POA: Diagnosis not present

## 2021-03-23 DIAGNOSIS — H02831 Dermatochalasis of right upper eyelid: Secondary | ICD-10-CM | POA: Diagnosis not present

## 2021-03-23 DIAGNOSIS — E785 Hyperlipidemia, unspecified: Secondary | ICD-10-CM | POA: Diagnosis not present

## 2021-03-23 DIAGNOSIS — H52213 Irregular astigmatism, bilateral: Secondary | ICD-10-CM | POA: Diagnosis not present

## 2021-03-23 DIAGNOSIS — Z7982 Long term (current) use of aspirin: Secondary | ICD-10-CM | POA: Diagnosis not present

## 2021-03-23 DIAGNOSIS — H02834 Dermatochalasis of left upper eyelid: Secondary | ICD-10-CM | POA: Diagnosis not present

## 2021-03-23 DIAGNOSIS — I10 Essential (primary) hypertension: Secondary | ICD-10-CM | POA: Diagnosis not present

## 2021-03-23 DIAGNOSIS — I4891 Unspecified atrial fibrillation: Secondary | ICD-10-CM | POA: Diagnosis not present

## 2021-03-24 DIAGNOSIS — H52213 Irregular astigmatism, bilateral: Secondary | ICD-10-CM | POA: Diagnosis not present

## 2021-03-24 DIAGNOSIS — H25811 Combined forms of age-related cataract, right eye: Secondary | ICD-10-CM | POA: Diagnosis not present

## 2021-03-24 DIAGNOSIS — Z961 Presence of intraocular lens: Secondary | ICD-10-CM | POA: Diagnosis not present

## 2021-03-29 DIAGNOSIS — Z7901 Long term (current) use of anticoagulants: Secondary | ICD-10-CM | POA: Diagnosis not present

## 2021-03-30 DIAGNOSIS — H52213 Irregular astigmatism, bilateral: Secondary | ICD-10-CM | POA: Diagnosis not present

## 2021-03-30 DIAGNOSIS — E669 Obesity, unspecified: Secondary | ICD-10-CM | POA: Diagnosis not present

## 2021-03-30 DIAGNOSIS — Z7984 Long term (current) use of oral hypoglycemic drugs: Secondary | ICD-10-CM | POA: Diagnosis not present

## 2021-03-30 DIAGNOSIS — Z6836 Body mass index (BMI) 36.0-36.9, adult: Secondary | ICD-10-CM | POA: Diagnosis not present

## 2021-03-30 DIAGNOSIS — Z86718 Personal history of other venous thrombosis and embolism: Secondary | ICD-10-CM | POA: Diagnosis not present

## 2021-03-30 DIAGNOSIS — I10 Essential (primary) hypertension: Secondary | ICD-10-CM | POA: Diagnosis not present

## 2021-03-30 DIAGNOSIS — Z7982 Long term (current) use of aspirin: Secondary | ICD-10-CM | POA: Diagnosis not present

## 2021-03-30 DIAGNOSIS — Z951 Presence of aortocoronary bypass graft: Secondary | ICD-10-CM | POA: Diagnosis not present

## 2021-03-30 DIAGNOSIS — H02831 Dermatochalasis of right upper eyelid: Secondary | ICD-10-CM | POA: Diagnosis not present

## 2021-03-30 DIAGNOSIS — H02834 Dermatochalasis of left upper eyelid: Secondary | ICD-10-CM | POA: Diagnosis not present

## 2021-03-30 DIAGNOSIS — I2510 Atherosclerotic heart disease of native coronary artery without angina pectoris: Secondary | ICD-10-CM | POA: Diagnosis not present

## 2021-03-30 DIAGNOSIS — Z79899 Other long term (current) drug therapy: Secondary | ICD-10-CM | POA: Diagnosis not present

## 2021-03-30 DIAGNOSIS — Z880 Allergy status to penicillin: Secondary | ICD-10-CM | POA: Diagnosis not present

## 2021-03-30 DIAGNOSIS — E1136 Type 2 diabetes mellitus with diabetic cataract: Secondary | ICD-10-CM | POA: Diagnosis not present

## 2021-03-30 DIAGNOSIS — H25811 Combined forms of age-related cataract, right eye: Secondary | ICD-10-CM | POA: Diagnosis not present

## 2021-03-30 DIAGNOSIS — E785 Hyperlipidemia, unspecified: Secondary | ICD-10-CM | POA: Diagnosis not present

## 2021-03-31 DIAGNOSIS — Z961 Presence of intraocular lens: Secondary | ICD-10-CM | POA: Diagnosis not present

## 2021-04-06 DIAGNOSIS — I4891 Unspecified atrial fibrillation: Secondary | ICD-10-CM | POA: Diagnosis not present

## 2021-04-06 DIAGNOSIS — Z7901 Long term (current) use of anticoagulants: Secondary | ICD-10-CM | POA: Diagnosis not present

## 2021-04-18 ENCOUNTER — Emergency Department (HOSPITAL_BASED_OUTPATIENT_CLINIC_OR_DEPARTMENT_OTHER)
Admission: EM | Admit: 2021-04-18 | Discharge: 2021-04-18 | Disposition: A | Discharge status: Home / Self Care | Payer: Medicare Other | Attending: Emergency Medicine | Admitting: Emergency Medicine

## 2021-04-18 ENCOUNTER — Emergency Department (HOSPITAL_BASED_OUTPATIENT_CLINIC_OR_DEPARTMENT_OTHER): Payer: Medicare Other

## 2021-04-18 ENCOUNTER — Encounter (HOSPITAL_BASED_OUTPATIENT_CLINIC_OR_DEPARTMENT_OTHER): Payer: Self-pay | Admitting: Emergency Medicine

## 2021-04-18 ENCOUNTER — Other Ambulatory Visit: Payer: Self-pay

## 2021-04-18 DIAGNOSIS — S82832A Other fracture of upper and lower end of left fibula, initial encounter for closed fracture: Secondary | ICD-10-CM

## 2021-04-18 DIAGNOSIS — Y92828 Other wilderness area as the place of occurrence of the external cause: Secondary | ICD-10-CM | POA: Insufficient documentation

## 2021-04-18 DIAGNOSIS — M79672 Pain in left foot: Secondary | ICD-10-CM | POA: Diagnosis not present

## 2021-04-18 DIAGNOSIS — S80211A Abrasion, right knee, initial encounter: Secondary | ICD-10-CM | POA: Diagnosis not present

## 2021-04-18 DIAGNOSIS — M7989 Other specified soft tissue disorders: Secondary | ICD-10-CM | POA: Diagnosis not present

## 2021-04-18 DIAGNOSIS — W010XXA Fall on same level from slipping, tripping and stumbling without subsequent striking against object, initial encounter: Secondary | ICD-10-CM | POA: Diagnosis not present

## 2021-04-18 DIAGNOSIS — S99912A Unspecified injury of left ankle, initial encounter: Secondary | ICD-10-CM | POA: Diagnosis present

## 2021-04-18 DIAGNOSIS — Z7982 Long term (current) use of aspirin: Secondary | ICD-10-CM | POA: Insufficient documentation

## 2021-04-18 DIAGNOSIS — Y9301 Activity, walking, marching and hiking: Secondary | ICD-10-CM | POA: Diagnosis not present

## 2021-04-18 DIAGNOSIS — I2510 Atherosclerotic heart disease of native coronary artery without angina pectoris: Secondary | ICD-10-CM | POA: Insufficient documentation

## 2021-04-18 DIAGNOSIS — E119 Type 2 diabetes mellitus without complications: Secondary | ICD-10-CM | POA: Insufficient documentation

## 2021-04-18 DIAGNOSIS — I110 Hypertensive heart disease with heart failure: Secondary | ICD-10-CM | POA: Insufficient documentation

## 2021-04-18 DIAGNOSIS — Z951 Presence of aortocoronary bypass graft: Secondary | ICD-10-CM | POA: Insufficient documentation

## 2021-04-18 DIAGNOSIS — Z7984 Long term (current) use of oral hypoglycemic drugs: Secondary | ICD-10-CM | POA: Diagnosis not present

## 2021-04-18 DIAGNOSIS — M25572 Pain in left ankle and joints of left foot: Secondary | ICD-10-CM | POA: Diagnosis not present

## 2021-04-18 DIAGNOSIS — I480 Paroxysmal atrial fibrillation: Secondary | ICD-10-CM | POA: Diagnosis not present

## 2021-04-18 DIAGNOSIS — I509 Heart failure, unspecified: Secondary | ICD-10-CM | POA: Insufficient documentation

## 2021-04-18 DIAGNOSIS — Z7901 Long term (current) use of anticoagulants: Secondary | ICD-10-CM | POA: Insufficient documentation

## 2021-04-18 NOTE — ED Notes (Signed)
Pt stated he has a set of crutches at home already and they are the appropriate size

## 2021-04-18 NOTE — ED Triage Notes (Signed)
Pt fell on Thursday injuring left ankle. Pt verifies ankle has some swelling.

## 2021-04-18 NOTE — ED Provider Notes (Signed)
[facility] EMERGENCY DEPARTMENT Provider Note   CSN: [id number] Arrival date & time: 04/18/21  1342     History Chief Complaint  Patient presents with  . Ankle Pain    Eduardo Butler is a 70 y.o. male.  HPI Patient is a 70 year old male with a past medical history detailed below he is on Coumadin for DVTs and states that [place] and [place] while walking through a stream because of slipped the water covered stones were.  He states that he fell onto his buttocks did not hit his head or lose consciousness and states that in the process of falling he hurt his left ankle.  He states that he felt that he had sprained it was able to stand up and walk.  He states that it did swell up some that night and has been achy since.  He states that his been gradually improving.  He denies any bleeding.  He states that he did scrape his right knee which had some superficial bleeding the day of the fall but no other injuries.  Denies any head neck or back pain.  No chest pain or lightheadedness or dizziness.  No other associate symptoms.    Past Medical History:  Diagnosis Date  . Anxiety   . Arthritis   . CHF (congestive heart failure) ([place])   . Coronary artery disease   . Diabetes ([place])   . H/O cardiac catheterization 08/25/2006   normal cath-patent grafts  . H/O Doppler ultrasound 02/21/2011   lower ext.venous doppler,, no treatment except support stockings are recommended if clinically indicated  . H/O echocardiogram 12/02/2010   EF 55% , no significant abnormalities  . History of stress test 01/04/2013   Lexiscan Myoview ; scattered PVC's ; LV EF 54% ; Normal stress test   . Hyperlipidemia   . Hypertension   . Obesity   . Peroneal DVT (deep venous thrombosis) ([place])   . Shortness of breath     Patient Active Problem List   Diagnosis Date Noted  . Paroxysmal atrial fibrillation ([place]) 10/21/2020  . Obstructive sleep apnea 10/21/2020  . HNP  (herniated nucleus pulposus with myelopathy), 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  . Long term (current) use of anticoagulants 07/02/2014  . Lupus anticoagulant positive 07/02/2014  . Essential hypertension 03/03/2014  . Coronary artery disease 03/03/2014  . Umbilical hernia 55/73/2202  . Hyperlipidemia 01/07/2011  . ANXIETY 01/07/2011  . 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 DEEP VENOUS THROMBOPHLEBITIS, RECURRENT 01/07/2011  . WEIGHT GAIN, ABNORMAL 01/07/2011  . DYSPNEA 01/07/2011    Past Surgical History:  Procedure Laterality Date  . BYPASS GRAFT  2003   4 bypass   . CARDIAC CATHETERIZATION  02-12-02   S/P sudden cardiac death; three vessel coronary artery disease; preserved overall left ventricular systolic function with loculated mild to moderate anterolateral hypokinesis; no mitral regurgitation noted  . CORONARY ARTERY BYPASS GRAFT  2003  . HERNIA REPAIR    . LEFT HEART CATH AND CORS/GRAFTS ANGIOGRAPHY N/A 04/23/2020   Procedure: LEFT HEART CATH AND CORS/GRAFTS ANGIOGRAPHY;  Surgeon: Lorretta Harp, MD;  Location: [facility];  Service: Cardiovascular;  Laterality: N/A;  . LUMBAR LAMINECTOMY/ DECOMPRESSION WITH MET-RX Left 07/08/2014   Procedure: Left Lumbar Four-Five Metrex foraminal microdiskectomy;  Surgeon: Consuella Lose, MD;  Location: [facility];  Service: Neurosurgery;  Laterality: Left;  Left Lumbar Four-Five Metrex foraminal microdiskectomy  . PELVIC LAPAROSCOPY  2005  . SHOULDER SURGERY    .  UMBILICAL HERNIA REPAIR  11/17/2011   Procedure: HERNIA REPAIR UMBILICAL ADULT;  Surgeon: Harl Bowie, MD;  Location: [facility];  Service: General;  Laterality: N/A;  umbilical hernia repair with mesh       Family History  Problem Relation Age of Onset  . Diabetes Mother   . Hypertension Brother   . Diabetes Brother   . Heart attack Neg Hx   . Stroke Neg Hx     Social History   Tobacco Use  . Smoking status: Never Smoker  .  Smokeless tobacco: Never Used  Substance Use Topics  . Alcohol use: No    Comment: none since 1979  . Drug use: No    Home Medications Prior to Admission medications   Medication Sig Start Date End Date Taking? Authorizing Provider  acetaminophen (TYLENOL) 500 MG tablet Take 1,000-1,500 mg by mouth every 6 (six) hours as needed for moderate pain.    [provider]  albuterol (VENTOLIN HFA) 108 (90 Base) MCG/ACT inhaler Inhale 2 puffs into the lungs every 6 (six) hours as needed for shortness of breath.  12/25/19   [provider]  ALPRAZolam Duanne Moron) 0.5 MG tablet Take 0.5 mg by mouth daily as needed for anxiety or sleep.     [provider]  Ascorbic Acid (VITAMIN C) 1000 MG tablet Take 1,000 mg by mouth daily.    [provider]  aspirin EC 81 MG tablet Take 81 mg by mouth daily.    [provider]  carvedilol (COREG) 3.125 MG tablet TAKE 1 TABLET(3.125 MG) BY MOUTH TWICE DAILY 06/11/20   Lorretta Harp, MD  cetirizine (ZYRTEC) 10 MG tablet Take 1 tablet (10 mg total) by mouth once as needed for up to 1 dose for allergies or rhinitis. 09/13/20   Varney Biles, MD  CINNAMON PO Take 1,000 mg by mouth daily.     [provider]  Elastic Bandages & Supports (MEDICAL COMPRESSION STOCKINGS) MISC Knee High 20-30 mm compression stockings 03/02/18   Lorretta Harp, MD  ezetimibe (ZETIA) 10 MG tablet Take 1 tablet (10 mg total) by mouth daily. 05/01/15   Lorretta Harp, MD  fluticasone (FLONASE) 50 MCG/ACT nasal spray Place 1 spray into both nostrils daily as needed for allergies or rhinitis.    [provider]  folic acid (FOLVITE) 1 MG tablet Take 1 mg by mouth daily.    [provider]  furosemide (LASIX) 80 MG tablet Take 1 tablet (80 mg total) by mouth daily. Call patient prior to filling, per patient request 08/05/20   Lorretta Harp, MD  gabapentin (NEURONTIN) 300 MG capsule Take 600 mg by mouth 3 (three) times daily.  09/25/20   [provider]  HYDROcodone-acetaminophen (NORCO/VICODIN) 5-325 MG tablet Take 1 tablet by mouth every 6 (six) hours as needed. 06/23/20   [provider]  losartan (COZAAR) 25 MG tablet TAKE 1 TABLET(25 MG) BY MOUTH DAILY 11/26/20   Lorretta Harp, MD  metFORMIN (GLUCOPHAGE) 1000 MG tablet Take 1,000 mg by mouth 2 (two) times daily with a meal.    [provider]  Misc Natural Products (IMMUNE FORMULA PO) Take 1 tablet by mouth daily. Immune: Vitamin C, D and Zinc. Chew 1 tablets daily.    [provider]  omeprazole (PRILOSEC) 40 MG capsule TAKE ONE CAPSULE BY MOUTH DAILY AS NEEDED FOR HEARTBURN 10/21/20   Lorretta Harp, MD  pioglitazone (ACTOS) 45 MG tablet Take 45 mg  by mouth daily.     [provider]  simvastatin (ZOCOR) 40 MG tablet Take 1 tablet (40 mg total) by mouth daily at 6 PM. 04/12/16   Lorretta Harp, MD  traZODone (DESYREL) 100 MG tablet Take 100 mg by mouth at bedtime.  04/03/20   [provider]  warfarin (COUMADIN) 4 MG tablet Take 4 mg by mouth See admin instructions. Take Sunday and thursday 07/17/19   [provider]  warfarin (COUMADIN) 5 MG tablet Take 5 mg by mouth See admin instructions. Take on Monday, Tuesday, Wednesday, Friday and Saturday    [provider]    Allergies    Morphine and related  Review of Systems   Review of Systems  Constitutional: Negative for chills and fever.  HENT: Negative for congestion.   Respiratory: Negative for shortness of breath.   Cardiovascular: Negative for chest pain.  Gastrointestinal: Negative for abdominal pain.  Musculoskeletal: Negative for neck pain.       Left ankle pain, right knee abrasion    Physical Exam Updated Vital Signs BP (!) 142/73 (BP Location: Right Arm)   Pulse 85   Temp 98.3 F (36.8 C) (Oral)   Resp 17   Ht _0  (1.676 m)   Wt 102.1 kg   SpO2 99%   BMI 36.32 kg/m   Physical Exam Vitals and nursing note  reviewed.  Constitutional:      General: He is not in acute distress. HENT:     Head: Normocephalic and atraumatic.     Nose: Nose normal.  Eyes:     General: No scleral icterus. Cardiovascular:     Rate and Rhythm: Normal rate and regular rhythm.     Pulses: Normal pulses.     Heart sounds: Normal heart sounds.  Pulmonary:     Effort: Pulmonary effort is normal. No respiratory distress.     Breath sounds: No wheezing.  Abdominal:     Palpations: Abdomen is soft.     Tenderness: There is no abdominal tenderness.  Musculoskeletal:     Cervical back: Normal range of motion.     Right lower leg: No edema.     Left lower leg: No edema.     Comments: Tenderness to palpation of the left lateral malleolus of left ankle.  No OTHER bony tenderness over joints or long bones of the upper and lower extremities.     No neck or back midline tenderness, step-off, deformity, or bruising. Able to turn head left and right 45 degrees without difficulty.  Full range of motion of upper and lower extremity joints shown after palpation was conducted; with 5/5 symmetrical strength in upper and lower extremities. No chest wall tenderness, no facial or cranial tenderness.   Patient has intact sensation grossly in lower and upper extremities. Intact patellar and ankle reflexes. Patient able to ambulate without difficulty.  Radial and DP pulses palpated BL.    Skin:    General: Skin is warm and dry.     Capillary Refill: Capillary refill takes less than 2 seconds.     Comments: Superficial abrasion to right knee  Neurological:     Mental Status: He is alert. Mental status is at baseline.  Psychiatric:        Mood and Affect: Mood normal.        Behavior: Behavior normal.     ED Results / Procedures / Treatments   Labs (all labs ordered are listed, but only abnormal results are  displayed) Labs Reviewed - No data to display  EKG None  Radiology DG Ankle Complete Left  Result Date:  04/18/2021 CLINICAL DATA:  70 year old male with LEFT ankle pain following injury. Initial encounter. EXAM: LEFT ANKLE COMPLETE - 3+ VIEW COMPARISON:  05/02/2018 radiographs FINDINGS: An oblique essentially nondisplaced fracture of the distal fibula is noted, 3-5 cm above the mortise. Diffuse soft tissue swelling is noted. No subluxation or dislocation identified. No other acute fracture is identified. IMPRESSION: Oblique fracture of the distal fibula. Overlying soft tissue swelling. Electronically Signed   By: Margarette Canada M.D.   On: 04/18/2021 14:52   DG Foot Complete Left  Result Date: 04/18/2021 CLINICAL DATA:  Acute LEFT foot pain following fall. Initial encounter. EXAM: LEFT FOOT - COMPLETE 3+ VIEW COMPARISON:  None. FINDINGS: No fracture of the distal fibula is identified. No other fracture, subluxation or dislocation identified. Soft tissue swelling is present. Vascular calcifications are present. IMPRESSION: Distal fibular fracture. Soft tissue swelling. Electronically Signed   By: Margarette Canada M.D.   On: 04/18/2021 14:54    Procedures Procedures   Medications Ordered in ED Medications - No data to display  ED Course  I have reviewed the triage vital signs and the nursing notes.  Pertinent labs & imaging results that were available during my care of the patient were reviewed by me and considered in my medical decision making (see chart for details).  Clinical Course as of 04/18/21 1527  Sun Apr 18, 3134  1041 70 year-old male slip and fall 4 days ago.  Has had left ankle pain since then worse with ambulation.  He is on anticoagulation but denies any head strike or headache.  On exam has tenderness just anterior to the lateral malleolus consistent with ATFL.  Checking x-rays [MB]    Clinical Course User Index [MB] Hayden Rasmussen, MD   MDM Rules/Calculators/A&P                          Patient is a-year-old male who had mechanical fall 4 days ago.  Left ankle pain worse with  walking but able to ambulate.  No head injury or loss of consciousness.  This fall was removed I have low suspicion for intracranial hemorrhage.  He is mentating well and moving around no confusion nausea vomiting no headaches.  X-ray reviewed of left ankle and left foot.  There is no metatarsal fracture but there is a distal fibular fracture.  Will place in cam walking boot and provided crutches.  Tylenol for pain.  Rest ice elevate and follow-up with orthopedics.  Patient was given information for [facility] however he states he has his own orthopedic provider he would like to see.  Return precautions given.  Final Clinical Impression(s) / ED Diagnoses Final diagnoses:  Closed fracture of distal end of left fibula, unspecified fracture morphology, initial encounter    Rx / DC Orders ED Discharge Orders    None       Tedd Sias, [place] 04/18/21 1529    Hayden Rasmussen, MD 04/18/21 1753

## 2021-04-18 NOTE — Discharge Instructions (Signed)
You have a fracture of the end of your fibula which is the bone that meets the ankle to great the ankle joint.  We placed in a walking boot which will help immobilize this fracture.  We will follow-up with [facility].  Use Tylenol 1000 mg every 6 hours as needed for pain.  Rest ice and elevate.

## 2021-04-21 DIAGNOSIS — M25572 Pain in left ankle and joints of left foot: Secondary | ICD-10-CM | POA: Diagnosis not present

## 2021-04-28 DIAGNOSIS — Z7901 Long term (current) use of anticoagulants: Secondary | ICD-10-CM | POA: Diagnosis not present

## 2021-04-28 DIAGNOSIS — I4891 Unspecified atrial fibrillation: Secondary | ICD-10-CM | POA: Diagnosis not present

## 2021-05-07 DIAGNOSIS — M25572 Pain in left ankle and joints of left foot: Secondary | ICD-10-CM | POA: Diagnosis not present

## 2021-05-07 DIAGNOSIS — S8265XA Nondisplaced fracture of lateral malleolus of left fibula, initial encounter for closed fracture: Secondary | ICD-10-CM | POA: Diagnosis not present

## 2021-05-12 DIAGNOSIS — Z7901 Long term (current) use of anticoagulants: Secondary | ICD-10-CM | POA: Diagnosis not present

## 2021-05-12 DIAGNOSIS — I4891 Unspecified atrial fibrillation: Secondary | ICD-10-CM | POA: Diagnosis not present

## 2021-05-18 DIAGNOSIS — I2510 Atherosclerotic heart disease of native coronary artery without angina pectoris: Secondary | ICD-10-CM | POA: Diagnosis not present

## 2021-05-18 DIAGNOSIS — D6869 Other thrombophilia: Secondary | ICD-10-CM | POA: Diagnosis not present

## 2021-05-18 DIAGNOSIS — E782 Mixed hyperlipidemia: Secondary | ICD-10-CM | POA: Diagnosis not present

## 2021-05-18 DIAGNOSIS — E1169 Type 2 diabetes mellitus with other specified complication: Secondary | ICD-10-CM | POA: Diagnosis not present

## 2021-05-18 DIAGNOSIS — I10 Essential (primary) hypertension: Secondary | ICD-10-CM | POA: Diagnosis not present

## 2021-05-18 DIAGNOSIS — F419 Anxiety disorder, unspecified: Secondary | ICD-10-CM | POA: Diagnosis not present

## 2021-05-18 DIAGNOSIS — Z7984 Long term (current) use of oral hypoglycemic drugs: Secondary | ICD-10-CM | POA: Diagnosis not present

## 2021-05-18 DIAGNOSIS — Z0000 Encounter for general adult medical examination without abnormal findings: Secondary | ICD-10-CM | POA: Diagnosis not present

## 2021-05-18 DIAGNOSIS — D6861 Antiphospholipid syndrome: Secondary | ICD-10-CM | POA: Diagnosis not present

## 2021-06-02 DIAGNOSIS — I4891 Unspecified atrial fibrillation: Secondary | ICD-10-CM | POA: Diagnosis not present

## 2021-06-02 DIAGNOSIS — Z7984 Long term (current) use of oral hypoglycemic drugs: Secondary | ICD-10-CM | POA: Diagnosis not present

## 2021-06-02 DIAGNOSIS — E1169 Type 2 diabetes mellitus with other specified complication: Secondary | ICD-10-CM | POA: Diagnosis not present

## 2021-06-02 DIAGNOSIS — Z7901 Long term (current) use of anticoagulants: Secondary | ICD-10-CM | POA: Diagnosis not present

## 2021-06-16 DIAGNOSIS — Z7901 Long term (current) use of anticoagulants: Secondary | ICD-10-CM | POA: Diagnosis not present

## 2021-06-16 DIAGNOSIS — I4891 Unspecified atrial fibrillation: Secondary | ICD-10-CM | POA: Diagnosis not present

## 2021-06-28 ENCOUNTER — Other Ambulatory Visit: Payer: Self-pay | Admitting: Cardiovascular Disease

## 2021-06-28 DIAGNOSIS — M25572 Pain in left ankle and joints of left foot: Secondary | ICD-10-CM | POA: Diagnosis not present

## 2021-06-28 DIAGNOSIS — S8265XD Nondisplaced fracture of lateral malleolus of left fibula, subsequent encounter for closed fracture with routine healing: Secondary | ICD-10-CM | POA: Diagnosis not present

## 2021-07-06 DIAGNOSIS — Z7901 Long term (current) use of anticoagulants: Secondary | ICD-10-CM | POA: Diagnosis not present

## 2021-07-06 DIAGNOSIS — I4891 Unspecified atrial fibrillation: Secondary | ICD-10-CM | POA: Diagnosis not present

## 2021-08-04 DIAGNOSIS — Z7901 Long term (current) use of anticoagulants: Secondary | ICD-10-CM | POA: Diagnosis not present

## 2021-09-02 ENCOUNTER — Telehealth: Payer: Self-pay | Admitting: Cardiovascular Disease

## 2021-09-02 NOTE — Telephone Encounter (Signed)
Spoke with pt, he reports for the last week he has noticed a weak feeling deep in his heart. He has been working out in his yard and has had to rest for about 30 min after working for 1 hour. He does not feel his heart racing but does feel a quivering in his chest and mild chest discomfort at times. He has also noticed a stuffy nose and a little SOB when active. He took xanax last night and he was able to relax and sleep. He feels okay this morning. His heart rate at present is 88 bpm per his oximeter. He has not taken his medications this morning. Reassurance given to the patient that he is okay to be in atrial fib but because of his symptoms will get him in to see dr berry tomorrow. Patient was asked to take it easy today and to track his bp and heart rate when he feels the quivering feeling in his chest. Patient voiced understanding to call back if his symptoms change prior to his appointment.

## 2021-09-02 NOTE — Telephone Encounter (Signed)
Pt c/o of Chest Pain: STAT if CP now or developed within 24 hours  1. Are you having CP right now? Feels like it a quivering in his chest, but not at this time  2. Are you experiencing any other symptoms (ex. SOB, nausea, vomiting, sweating)? Feels like he can not get air- anxiety  3. How long have you been experiencing CP? A couple of months- started getting worse last week and this week  4. Is your CP continuous or coming and going? Comes and goes  5. Have you taken Nitroglycerin? Does not have Nitroglycerin- never had any -Patient wanted  to be seen- I made him an appointment with Laurann Montana for Monday(09-06-21) Please call to evaluate  ?

## 2021-09-03 ENCOUNTER — Encounter: Payer: Self-pay | Admitting: Cardiovascular Disease

## 2021-09-03 ENCOUNTER — Ambulatory Visit (INDEPENDENT_AMBULATORY_CARE_PROVIDER_SITE_OTHER): Payer: Medicare Other | Admitting: Cardiovascular Disease

## 2021-09-03 ENCOUNTER — Other Ambulatory Visit: Payer: Self-pay

## 2021-09-03 DIAGNOSIS — I480 Paroxysmal atrial fibrillation: Secondary | ICD-10-CM | POA: Diagnosis not present

## 2021-09-03 DIAGNOSIS — I10 Essential (primary) hypertension: Secondary | ICD-10-CM

## 2021-09-03 DIAGNOSIS — I25810 Atherosclerosis of coronary artery bypass graft(s) without angina pectoris: Secondary | ICD-10-CM

## 2021-09-03 DIAGNOSIS — E782 Mixed hyperlipidemia: Secondary | ICD-10-CM

## 2021-09-03 MED ORDER — CARVEDILOL 6.25 MG PO TABS: 3.2500 mg | ORAL_TABLET | Freq: Two times a day (BID) | ORAL | 3 refills | Status: DC

## 2021-09-03 NOTE — Assessment & Plan Note (Signed)
History of CAD status post coronary artery bypass grafting in March 2003 with a LIMA to his LAD, free radial to the circumflex marginal and sequential vein acute marginal on the right and distal RCA.  I performed cardiac catheterization on him 04/23/2020 revealing patent grafts and normal LV function.  He denies chest pain but occasionally gets short of breath.

## 2021-09-03 NOTE — Assessment & Plan Note (Signed)
History of essential hypertension with blood pressure measured today 112/69.  He is on carvedilol and losartan.

## 2021-09-03 NOTE — Assessment & Plan Note (Signed)
History of hyperlipidemia on Zetia and simvastatin with lipid profile performed 10/21/2020 revealing total cholesterol of 117, LDL 59 and HDL of 44.

## 2021-09-03 NOTE — Progress Notes (Signed)
09/03/2021 Eduardo Butler   11/24/1951  [id number]  Primary Physician Orpah Melter, MD Primary Cardiologist: Lorretta Harp MD FACP, [place], [place], [place]  HPI:  Eduardo Butler is a 70 y.o.  moderately overweight married Caucasian male, father of 2, grandfather to 2 grandchildren who I last saw in the office 01/01/2021.[name]  He is accompanied by his wife Eduardo Butler today.  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 [name] follows. His other problems include hyperlipidemia and erectile dysfunction. I catheterized him at the [facility] September 28th , 2007 revealing patent grafts with normal LV function .  This suggested that he had a false positive Myoview stress test and medical therapy was recommended.  His last Myoview performed 01/04/13 was nonischemic Dr. Doyle Askew follows his lipid profile. When I saw him 2 months ago he was complaining of dyspnea on exertion. He saw one of our PAs a month later with increasing dyspnea on exertion. His diastolic was cut in half but remains bradycardic although he was clinically improved. His when necessary hydrochlorothiazide was changed to when necessary Lasix which she took over the weekend for several doses which resulted in improved dyspnea and decreased edema. I did discuss with him today so restriction. A 2-D echo was ordered that showed normal FUNCTION in a Myoview stress test that showed apical thinning with new ischemia in the inferior wall compared to a previous Myoview 2 years ago. Since I saw him 3 months ago he continued to do well.  He says he is stronger.  I did adjust his diuretics which improved his lower extremity edema.  He also  admits to dietary indiscretion.  He denies chest pain does have chronic dyspnea on exertion.   He has seen Almyra Deforest PA-C in the office 03/29/2020...  His major complaint is of dyspnea.  He has not lost any weight.  He denies chest pain.  An echo performed 03/07/2018 was completely normal.  He has been more dyspneic and weaker since I saw him back in December.  A Myoview stress test ordered by Almyra Deforest 04/08/2020 showed subtle anterior ischemia only mildly different from the last Myoview performed 7/17.  However, given the fact that his last cath was 14 years ago I decided to perform outpatient cardiac catheterization on 04/23/2020 revealing patent grafts.  2D echo revealed normal LV function.   He  was seen in the ER 09/13/2020 with dyspnea with negative work-up.  Chest x-ray showed no active disease.  He saw Dr. Doyle Askew in the office on 10/16/2020 and an EKG on that date did show atrial fibrillation.  He remains on warfarin anticoagulation.  His hemoglobin was apparently in the mid 12 range.   He had an event monitor that showed predominantly sinus rhythm with PACs, PVCs, short runs of SVT and not as VT as well as runs of PAF with slow ventricular response.  He is on warfarin oral anticoagulation.  Since I saw him 8 months ago he seems to be more anxious than usual.  He does wear compression stockings and is on furosemide for lower extremity edema.  He complains of frequent tachypalpitations and some dyspnea.   Current  Meds  Medication Sig   acetaminophen (TYLENOL) 500 MG tablet Take 1,000-1,500 mg by mouth every 6 (six) hours as needed for moderate pain.   albuterol (VENTOLIN HFA) 108 (90 Base) MCG/ACT inhaler Inhale 2 puffs into the lungs every 6 (six) hours as needed for shortness of breath.    ALPRAZolam (XANAX) 0.5 MG tablet Take 0.5 mg by mouth daily as needed for anxiety or sleep.    Ascorbic Acid (VITAMIN C) 1000 MG tablet Take 1,000 mg by mouth daily.   aspirin EC 81 MG tablet Take 81 mg by mouth  daily.   cetirizine (ZYRTEC) 10 MG tablet Take 1 tablet (10 mg total) by mouth once as needed for up to 1 dose for allergies or rhinitis.   CINNAMON PO Take 1,000 mg by mouth daily.    Elastic Bandages & Supports (MEDICAL COMPRESSION STOCKINGS) MISC Knee High 20-30 mm compression stockings   ezetimibe (ZETIA) 10 MG tablet Take 1 tablet (10 mg total) by mouth daily.   fluticasone (FLONASE) 50 MCG/ACT nasal spray Place 1 spray into both nostrils daily as needed for allergies or rhinitis.   folic acid (FOLVITE) 1 MG tablet Take 1 mg by mouth daily.   furosemide (LASIX) 80 MG tablet Take 1 tablet (80 mg total) by mouth daily. Call patient prior to filling, per patient request   HYDROcodone-acetaminophen (NORCO/VICODIN) 5-325 MG tablet Take 1 tablet by mouth every 6 (six) hours as needed.   losartan (COZAAR) 25 MG tablet TAKE 1 TABLET(25 MG) BY MOUTH DAILY   metFORMIN (GLUCOPHAGE) 1000 MG tablet Take 1,000 mg by mouth 2 (two) times daily with a meal.   Misc Natural Products (IMMUNE FORMULA PO) Take 1 tablet by mouth daily. Immune: Vitamin C, D and Zinc. Chew 1 tablets daily.   omeprazole (PRILOSEC) 40 MG capsule TAKE ONE CAPSULE BY MOUTH DAILY AS NEEDED FOR HEARTBURN   pioglitazone (ACTOS) 45 MG tablet Take 45 mg by mouth daily.    simvastatin (ZOCOR) 40 MG tablet Take 1 tablet (40 mg total) by mouth daily at 6 PM.   traZODone (DESYREL) 100 MG tablet Take 100 mg by mouth at bedtime.    warfarin (COUMADIN) 4 MG tablet Take 4 mg by mouth See admin instructions. Take Sunday and thursday   warfarin (COUMADIN) 5 MG tablet Take 5 mg by mouth See admin instructions. Take on Monday, Tuesday, Wednesday, Friday and Saturday   [DISCONTINUED] carvedilol (COREG) 3.125 MG tablet TAKE 1 TABLET(3.125 MG) BY MOUTH TWICE DAILY   [DISCONTINUED] gabapentin (NEURONTIN) 300 MG capsule Take 600 mg by mouth 3 (three) times daily.   Current Facility-Administered Medications for the 09/03/21 encounter (Office Visit) with  Lorretta Harp, MD  Medication   sodium chloride flush (NS) 0.9 % injection 3 mL     Allergies  Allergen Reactions   Morphine And Related     hallucinations    Social History   Socioeconomic History   Marital status: Married    Spouse name: Not on file   Number of children: Not on file   Years of education: Not on file   Highest education level: Not on file  Occupational History   Not on file  Tobacco Use   Smoking status: Never   Smokeless tobacco: Never  Substance and Sexual Activity   Alcohol use: No    Comment: none since 1979   Drug use: No   Sexual activity: Not on file  Other Topics Concern   Not on file  Social  History Narrative   Not on file   Social Determinants of Health   Financial Resource Strain: Not on file  Food Insecurity: Not on file  Transportation Needs: Not on file  Physical Activity: Not on file  Stress: Not on file  Social Connections: Not on file  Intimate Partner Violence: Not on file     Review of Systems: General: negative for chills, fever, night sweats or weight changes.  Cardiovascular: negative for chest pain, dyspnea on exertion, edema, orthopnea, palpitations, paroxysmal nocturnal dyspnea or shortness of breath Dermatological: negative for rash Respiratory: negative for cough or wheezing Urologic: negative for hematuria Abdominal: negative for nausea, vomiting, diarrhea, bright red blood per rectum, melena, or hematemesis Neurologic: negative for visual changes, syncope, or dizziness All other systems reviewed and are otherwise negative except as noted above.    Blood pressure 112/69, pulse 74, height 5\' 6"  (1.676 m), weight 257 lb 6.4 oz (116.8 kg), SpO2 97 %.  General appearance: alert and no distress Neck: no adenopathy, no carotid bruit, no JVD, supple, symmetrical, trachea midline, and thyroid not enlarged, symmetric, no tenderness/mass/nodules Lungs: clear to auscultation bilaterally Heart: regular rate and rhythm,  S1, S2 normal, no murmur, click, rub or gallop Extremities: extremities normal, atraumatic, no cyanosis or edema Pulses: 2+ and symmetric Skin: Skin color, texture, turgor normal. No rashes or lesions Neurologic: Grossly normal  EKG sinus rhythm at 74 with marked sinus arrhythmia low limb voltage.  I personally reviewed this EKG.  ASSESSMENT AND PLAN:   Hyperlipidemia History of hyperlipidemia on Zetia and simvastatin with lipid profile performed 10/21/2020 revealing total cholesterol of 117, LDL 59 and HDL of 44.  Essential hypertension History of essential hypertension with blood pressure measured today 112/69.  He is on carvedilol and losartan.  Coronary artery disease History of CAD status post coronary artery bypass grafting in March 2003 with a LIMA to his LAD, free radial to the circumflex marginal and sequential vein acute marginal on the right and distal RCA.  I performed cardiac catheterization on him 04/23/2020 revealing patent grafts and normal LV function.  He denies chest pain but occasionally gets short of breath.  Paroxysmal atrial fibrillation (HCC) History of PAF maintaining sinus rhythm on warfarin oral anticoagulation.  He does have a history of a lupus anticoagulant and has had pulmonary embolism in the past.  He has been noting increased palpitations recently.  He appears very anxious as well.  I am going to increase his carvedilol from 3.125 to 6.25 mg p.o. twice daily.     Lorretta Harp MD FACP,FACC,FAHA, FSCAI 09/03/2021 3:00 PM

## 2021-09-03 NOTE — Assessment & Plan Note (Signed)
History of PAF maintaining sinus rhythm on warfarin oral anticoagulation.  He does have a history of a lupus anticoagulant and has had pulmonary embolism in the past.  He has been noting increased palpitations recently.  He appears very anxious as well.  I am going to increase his carvedilol from 3.125 to 6.25 mg p.o. twice daily.

## 2021-09-03 NOTE — Patient Instructions (Signed)
Medication Instructions:   -Increase carvedilol (coreg) 6.25mg  twice daily.  *If you need a refill on your cardiac medications before your next appointment, please call your pharmacy*   Follow-Up: At [facility], you and your health needs are our priority.  As part of our continuing mission to provide you with exceptional heart care, we have created designated Provider Care Teams.  These Care Teams include your primary Cardiologist (physician) and Advanced Practice Providers (APPs -  Physician Assistants and Nurse Practitioners) who all work together to provide you with the care you need, when you need it.  We recommend signing up for the patient portal called "MyChart".  Sign up information is provided on this After Visit Summary.  MyChart is used to connect with patients for Virtual Visits (Telemedicine).  Patients are able to view lab/test results, encounter notes, upcoming appointments, etc.  Non-urgent messages can be sent to your provider as well.   To learn more about what you can do with MyChart, go to [email or web address].    Your next appointment:   3 month(s)  The format for your next appointment:   In Person  Provider:   You will see one of the following Advanced Practice Providers on your designated Care Team:   Coletta Memos, FNP  Then, Quay Burow, MD will plan to see you again in 6 month(s).

## 2021-09-06 ENCOUNTER — Ambulatory Visit (HOSPITAL_BASED_OUTPATIENT_CLINIC_OR_DEPARTMENT_OTHER): Payer: Medicare Other | Admitting: Family

## 2021-09-06 DIAGNOSIS — I4891 Unspecified atrial fibrillation: Secondary | ICD-10-CM | POA: Diagnosis not present

## 2021-09-06 DIAGNOSIS — Z7901 Long term (current) use of anticoagulants: Secondary | ICD-10-CM | POA: Diagnosis not present

## 2021-09-13 ENCOUNTER — Telehealth: Payer: Self-pay

## 2021-09-13 NOTE — Telephone Encounter (Signed)
Returned call to pt and they stated that they haven't noticed any symptoms. And will continue to monitor them and contact us as needed. I also read verbatim what rph nacion wrote and he stated that he will talk to cardiologist as his next appt.

## 2021-09-13 NOTE — Telephone Encounter (Signed)
Based upon a 2016 study there is an association between PPI use (omeprazole) and incidence of dementia. However this is an association, which does not imply that the medication caused the development of dementia, just that patients who were on this medication were more likely to have dementia. It may be that patients on this medication have more risk factors for the development of dementia. His current prescription is to use omeprazole as needed for heartburn. If he is concerned about the risk of dementia he could consider using an alternative medication such as Tums for heartburn which is available over the counter.   The medication has been prescribed for over 1 year. Has he noticed any symptoms or has he been tolerating it? If he is noticing new memory systems I would suggest he follows up with his primary care provider.

## 2021-09-13 NOTE — Telephone Encounter (Signed)
Pt called in worried that omeprazole causes dementia. Will route to pharmd pool to address.

## 2021-10-19 DIAGNOSIS — I4891 Unspecified atrial fibrillation: Secondary | ICD-10-CM | POA: Diagnosis not present

## 2021-10-19 DIAGNOSIS — Z7901 Long term (current) use of anticoagulants: Secondary | ICD-10-CM | POA: Diagnosis not present

## 2021-10-25 ENCOUNTER — Telehealth: Payer: Self-pay | Admitting: Cardiovascular Disease

## 2021-10-25 ENCOUNTER — Telehealth: Payer: Self-pay | Admitting: Interventional Cardiology

## 2021-10-25 DIAGNOSIS — R002 Palpitations: Secondary | ICD-10-CM

## 2021-10-25 NOTE — Telephone Encounter (Signed)
Pt notified of referral to EP.

## 2021-10-25 NOTE — Telephone Encounter (Signed)
error 

## 2021-10-25 NOTE — Telephone Encounter (Signed)
Pt states that he and his daughter have been discussing his a-fib. They feel that he needs to be seen by EP doctor. Pt now agrees with having a sleep study done. Pt reports that on yesterday morning he went into Afib. Pt does report raking leaves on Saturday. He is taking Coreg as prescribed. Please advise.

## 2021-10-25 NOTE — Telephone Encounter (Signed)
Patient c/o Palpitations:  High priority if patient c/o lightheadedness, shortness of breath, or chest pain  How long have you had palpitations/irregular HR/ Afib? Are you having the symptoms now? Yesterday; no   Are you currently experiencing lightheadedness, SOB or CP? No   Do you have a history of afib (atrial fibrillation) or irregular heart rhythm? Yes   Have you checked your BP or HR? (document readings if available): No  Are you experiencing any other symptoms? Shallow breathing, loss of strength, headache, feel like he is suffocating

## 2021-10-26 ENCOUNTER — Other Ambulatory Visit: Payer: Self-pay | Admitting: Cardiovascular Disease

## 2021-11-08 ENCOUNTER — Telehealth: Payer: Self-pay | Admitting: Cardiovascular Disease

## 2021-11-08 NOTE — Telephone Encounter (Signed)
*  STAT* If patient is at the pharmacy, call can be transferred to refill team.   1. Which medications need to be refilled? (please list name of each medication and dose if known) carvedilol (COREG) 6.25 MG tablet (he takes one full tablet twice a day see OV note dated 09/03/21)  2. Which pharmacy/location (including street and city if local pharmacy) is medication to be sent to?[id number] - [place], [place] - [address] [place] [address]  3. Do they need a 30 day or 90 day supply? 90 day

## 2021-11-09 MED ORDER — CARVEDILOL 6.25 MG PO TABS: 6.2500 mg | ORAL_TABLET | Freq: Two times a day (BID) | ORAL | 3 refills | Status: DC

## 2021-11-12 DIAGNOSIS — S161XXA Strain of muscle, fascia and tendon at neck level, initial encounter: Secondary | ICD-10-CM | POA: Diagnosis not present

## 2021-11-24 DIAGNOSIS — E1169 Type 2 diabetes mellitus with other specified complication: Secondary | ICD-10-CM | POA: Diagnosis not present

## 2021-11-24 DIAGNOSIS — E782 Mixed hyperlipidemia: Secondary | ICD-10-CM | POA: Diagnosis not present

## 2021-11-24 DIAGNOSIS — I4891 Unspecified atrial fibrillation: Secondary | ICD-10-CM | POA: Diagnosis not present

## 2021-11-24 DIAGNOSIS — Z23 Encounter for immunization: Secondary | ICD-10-CM | POA: Diagnosis not present

## 2021-11-24 DIAGNOSIS — I10 Essential (primary) hypertension: Secondary | ICD-10-CM | POA: Diagnosis not present

## 2021-11-24 DIAGNOSIS — D6869 Other thrombophilia: Secondary | ICD-10-CM | POA: Diagnosis not present

## 2021-11-24 DIAGNOSIS — F419 Anxiety disorder, unspecified: Secondary | ICD-10-CM | POA: Diagnosis not present

## 2021-11-24 DIAGNOSIS — Z86718 Personal history of other venous thrombosis and embolism: Secondary | ICD-10-CM | POA: Diagnosis not present

## 2021-11-24 DIAGNOSIS — K219 Gastro-esophageal reflux disease without esophagitis: Secondary | ICD-10-CM | POA: Diagnosis not present

## 2021-11-24 DIAGNOSIS — Z7901 Long term (current) use of anticoagulants: Secondary | ICD-10-CM | POA: Diagnosis not present

## 2021-11-24 DIAGNOSIS — Z7984 Long term (current) use of oral hypoglycemic drugs: Secondary | ICD-10-CM | POA: Diagnosis not present

## 2021-11-24 LAB — LIPID PANEL
Cholesterol: 128 (ref 0–200)
HDL: 47 (ref 35–70)
LDL Cholesterol: 82
Triglycerides: 131 (ref 40–160)

## 2021-12-01 DIAGNOSIS — Z7901 Long term (current) use of anticoagulants: Secondary | ICD-10-CM | POA: Diagnosis not present

## 2021-12-10 ENCOUNTER — Ambulatory Visit: Payer: Medicare Other | Admitting: Cardiovascular Disease

## 2021-12-10 DIAGNOSIS — I4891 Unspecified atrial fibrillation: Secondary | ICD-10-CM | POA: Diagnosis not present

## 2021-12-10 DIAGNOSIS — Z7901 Long term (current) use of anticoagulants: Secondary | ICD-10-CM | POA: Diagnosis not present

## 2021-12-17 ENCOUNTER — Other Ambulatory Visit: Payer: Self-pay

## 2021-12-17 ENCOUNTER — Ambulatory Visit (INDEPENDENT_AMBULATORY_CARE_PROVIDER_SITE_OTHER): Payer: Medicare Other | Admitting: Cardiology

## 2021-12-17 ENCOUNTER — Encounter: Payer: Self-pay | Admitting: Cardiology

## 2021-12-17 VITALS — BP 110/68 | HR 78 | Ht 66.0 in | Wt 267.2 lb

## 2021-12-17 DIAGNOSIS — I480 Paroxysmal atrial fibrillation: Secondary | ICD-10-CM | POA: Diagnosis not present

## 2021-12-17 NOTE — Progress Notes (Signed)
Electrophysiology Office Note   Date:  12/17/2021   ID:  Mckennon, Zwart 1951/01/12, MRN [id number]  PCP:  Orpah Melter, MD  Cardiologist:  Gwenlyn Found Primary Electrophysiologist:  Taniah Reinecke Meredith Leeds, MD    Chief Complaint: AF   History of Present Illness: Eduardo Butler is a 71 y.o. male who is being seen today for the evaluation of AF at the request of Lorretta Harp, MD. Presenting today for electrophysiology evaluation.  He has a history significant for coronary artery disease status post CABG x4, PE secondary to lupus anticoagulant on long-term Coumadin, hyperlipidemia.  He also has a history of atrial fibrillation.  Today, he denies symptoms of palpitations, chest pain,orthopnea, PND, lower extremity edema, claudication, dizziness, presyncope, syncope, bleeding, or neurologic sequela. The patient is tolerating medications without difficulties.  He feels weak and fatigued today.  This is similar to how he feels when he is in atrial fibrillation.  He does not feel this way every day and is usually able to do all of his daily activities.  He is surprised to know that he is in atrial fibrillation, feeling that he may be getting sick, but upon realization of atrial fibrillation, he understands that it is likely the cause of his symptoms.   Past Medical History:  Diagnosis Date   Anxiety    Arthritis    CHF (congestive heart failure) ([place])    Coronary artery disease    Diabetes ([place])    H/O cardiac catheterization 08/25/2006   normal cath-patent grafts   H/O Doppler ultrasound 02/21/2011   lower ext.venous doppler,, no treatment except support stockings are recommended if clinically indicated   H/O echocardiogram 12/02/2010   EF 55% , no significant abnormalities   History of stress test 01/04/2013   Lexiscan Myoview ; scattered PVC's ; LV EF 54% ; Normal stress test    Hyperlipidemia    Hypertension    Obesity    Peroneal DVT (deep venous thrombosis) (HCC)    Shortness of  breath    Past Surgical History:  Procedure Laterality Date   BYPASS GRAFT  2003   4 bypass    CARDIAC CATHETERIZATION  03-02-2002   S/P sudden cardiac death; three vessel coronary artery disease; preserved overall left ventricular systolic function with loculated mild to moderate anterolateral hypokinesis; no mitral regurgitation noted   CORONARY ARTERY BYPASS GRAFT  2003   HERNIA REPAIR     LEFT HEART CATH AND CORS/GRAFTS ANGIOGRAPHY N/A 04/23/2020   Procedure: LEFT HEART CATH AND CORS/GRAFTS ANGIOGRAPHY;  Surgeon: Lorretta Harp, MD;  Location: [place] [facility];  Service: Cardiovascular;  Laterality: N/A;   LUMBAR LAMINECTOMY/ DECOMPRESSION WITH MET-RX Left 07/08/2014   Procedure: Left Lumbar Four-Five Metrex foraminal microdiskectomy;  Surgeon: Consuella Lose, MD;  Location: [facility];  Service: Neurosurgery;  Laterality: Left;  Left Lumbar Four-Five Metrex foraminal microdiskectomy   PELVIC LAPAROSCOPY  2005   SHOULDER SURGERY     UMBILICAL HERNIA REPAIR  11/17/2011   Procedure: HERNIA REPAIR UMBILICAL ADULT;  Surgeon: Harl Bowie, MD;  Location: [place];  Service: General;  Laterality: N/A;  umbilical hernia repair with mesh     Current Outpatient Medications  Medication Sig Dispense Refill   acetaminophen (TYLENOL) 500 MG tablet Take 1,000-1,500 mg by mouth every 6 (six) hours as needed for moderate pain.     albuterol (VENTOLIN HFA) 108 (90 Base) MCG/ACT inhaler Inhale 2 puffs into the lungs every 6 (six) hours as  needed for shortness of breath.      ALPRAZolam (XANAX) 0.5 MG tablet Take 0.5 mg by mouth daily as needed for anxiety or sleep.      Ascorbic Acid (VITAMIN C) 1000 MG tablet Take 1,000 mg by mouth daily.     aspirin EC 81 MG tablet Take 81 mg by mouth daily.     carvedilol (COREG) 6.25 MG tablet Take 1 tablet (6.25 mg total) by mouth 2 (two) times daily with a meal. 180 tablet 3   cetirizine (ZYRTEC) 10 MG tablet Take 1 tablet (10 mg  total) by mouth once as needed for up to 1 dose for allergies or rhinitis. 5 tablet 0   CINNAMON PO Take 1,000 mg by mouth daily.      Elastic Bandages & Supports (MEDICAL COMPRESSION STOCKINGS) MISC Knee High 20-30 mm compression stockings 2 each 0   ezetimibe (ZETIA) 10 MG tablet Take 1 tablet (10 mg total) by mouth daily. 100 tablet 3   fluticasone (FLONASE) 50 MCG/ACT nasal spray Place 1 spray into both nostrils daily as needed for allergies or rhinitis.     folic acid (FOLVITE) 1 MG tablet Take 1 mg by mouth daily.     furosemide (LASIX) 80 MG tablet TAKE 1 TABLET(80 MG) BY MOUTH DAILY 90 tablet 3   HYDROcodone-acetaminophen (NORCO/VICODIN) 5-325 MG tablet Take 1 tablet by mouth every 6 (six) hours as needed.     losartan (COZAAR) 25 MG tablet TAKE 1 TABLET(25 MG) BY MOUTH DAILY 90 tablet 3   metFORMIN (GLUCOPHAGE) 1000 MG tablet Take 1,000 mg by mouth 2 (two) times daily with a meal.     Misc Natural Products (IMMUNE FORMULA PO) Take 1 tablet by mouth daily. Immune: Vitamin C, D and Zinc. Chew 1 tablets daily.     omeprazole (PRILOSEC) 40 MG capsule TAKE ONE CAPSULE BY MOUTH DAILY AS NEEDED FOR HEARTBURN 90 capsule 3   pioglitazone (ACTOS) 45 MG tablet Take 45 mg by mouth daily.      simvastatin (ZOCOR) 40 MG tablet Take 1 tablet (40 mg total) by mouth daily at 6 PM. 30 tablet 11   traZODone (DESYREL) 100 MG tablet Take 100 mg by mouth at bedtime.      warfarin (COUMADIN) 4 MG tablet Take 4 mg by mouth See admin instructions. Take Sunday and thursday     warfarin (COUMADIN) 5 MG tablet Take 5 mg by mouth See admin instructions. Take on Monday, Tuesday, Wednesday, Friday and Saturday     Current Facility-Administered Medications  Medication Dose Route Frequency Provider Last Rate Last Admin   sodium chloride flush (NS) 0.9 % injection 3 mL  3 mL Intravenous Q12H Lorretta Harp, MD        Allergies:   Morphine and related   Social History:  The patient  reports that he has never  smoked. He has never used smokeless tobacco. He reports that he does not drink alcohol and does not use drugs.   Family History:  The patient's family history includes Diabetes in his brother and mother; Hypertension in his brother.    ROS:  Please see the history of present illness.   Otherwise, review of systems is positive for none.   All other systems are reviewed and negative.    PHYSICAL EXAM: VS:  BP 110/68    Pulse 78    Ht '5\' 6"'  (1.676 m)    Wt 267 lb 3.2 oz (121.2 kg)    SpO2 96%  BMI 43.13 kg/m  , BMI Body mass index is 43.13 kg/m. GEN: Well nourished, well developed, in no acute distress  HEENT: normal  Neck: no JVD, carotid bruits, or masses Cardiac: irregular; no murmurs, rubs, or gallops,no edema  Respiratory:  clear to auscultation bilaterally, normal work of breathing GI: soft, nontender, nondistended, + BS MS: no deformity or atrophy  Skin: warm and dry Neuro:  Strength and sensation are intact Psych: euthymic mood, full affect  EKG:  EKG is ordered today. Personal review of the ekg ordered shows trill fibrillation, rate 78  Recent Labs: No results found for requested labs within last 8760 hours.    Lipid Panel     Component Value Date/Time   CHOL 117 10/21/2020 1152   TRIG 67 10/21/2020 1152   HDL 44 10/21/2020 1152   CHOLHDL 2.7 10/21/2020 1152   CHOLHDL 3.6 06/26/2007 1510   VLDL 25 06/26/2007 1510   LDLCALC 59 10/21/2020 1152     Wt Readings from Last 3 Encounters:  12/17/21 267 lb 3.2 oz (121.2 kg)  09/03/21 257 lb 6.4 oz (116.8 kg)  04/18/21 225 lb (102.1 kg)      Other studies Reviewed: Additional studies/ records that were reviewed today include: TTE 04/21/21  Review of the above records today demonstrates:   1. Left ventricular ejection fraction, by estimation, is 55 to 60%. The  left ventricle has normal function. The left ventricle has no regional  wall motion abnormalities. There is mild left ventricular hypertrophy.  Left  ventricular diastolic parameters  were normal.   2. Right ventricular systolic function is normal. The right ventricular  size is normal.   3. The mitral valve is grossly normal. Trivial mitral valve  regurgitation.   4. The aortic valve is tricuspid. Aortic valve regurgitation is not  visualized.   5. The inferior vena cava is normal in size with greater than 50%  respiratory variability, suggesting right atrial pressure of 3 mmHg.    ASSESSMENT AND PLAN:  1.  Paroxysmal atrial fibrillation: CHA2DS2-VASc of 3.  Currently on Coumadin.  He has been having increasing palpitations.  He feels quite poorly in atrial fibrillation.  He would like to stay in normal rhythm.  We discussed options including ablation versus dofetilide.  He would like to discuss this further with his family and Opel Lejeune get back in touch with [place] at a later date.  2.  Coronary artery disease: Status post CABG x4.  Catheterization 04/23/2020 with patent grafts.  No chest pain.  Plan per primary cardiology.  3.  Hypertension: Currently well controlled  4.  Hyperlipidemia: The and simvastatin with goal LDL less than 70 per primary cardiology  Case discussed with primary cardiology  Current medicines are reviewed at length with the patient today.   The patient does not have concerns regarding his medicines.  The following changes were made today:  none  Labs/ tests ordered today include:  Orders Placed This Encounter  Procedures   EKG 12-Lead     Disposition:   FU with Carlie Corpus 3 months  Signed, Bryndan Bilyk Meredith Leeds, MD  12/17/2021 3:25 PM     [place] [address] [phone] (office) [phone] (fax)

## 2021-12-17 NOTE — Patient Instructions (Addendum)
Medication Instructions:  Your physician recommends that you continue on your current medications as directed. Please refer to the Current Medication list given to you today.  *If you need a refill on your cardiac medications before your next appointment, please call your pharmacy*   Lab Work: None ordered   Testing/Procedures: None ordered   Follow-Up: At [facility], you and your health needs are our priority.  As part of our continuing mission to provide you with exceptional heart care, we have created designated Provider Care Teams.  These Care Teams include your primary Cardiologist (physician) and Advanced Practice Providers (APPs -  Physician Assistants and Nurse Practitioners) who all work together to provide you with the care you need, when you need it.  Your next appointment:   To be  determined  The format for your next appointment:   In Person  Provider:   Allegra Lai, MD    Thank you for choosing [facility]!!   Trinidad Curet, RN [phone]   Other Instructions Please call the office when you have decided on one of the following (ablation vs Tikosyn):  Dofetilide capsules What is this medication? DOFETILIDE (doe FET il ide) is an antiarrhythmic drug. It helps make your heart beat regularly. This medicine also helps to slow rapid heartbeats. This medicine may be used for other purposes; ask your health care provider or pharmacist if you have questions. COMMON BRAND NAME(S): Tikosyn What should I tell my care team before I take this medication? They need to know if you have any of these conditions: heart disease history of irregular heartbeat history of low levels of potassium or magnesium in the blood kidney disease liver disease an unusual or allergic reaction to dofetilide, other medicines, foods, dyes, or preservatives pregnant or trying to get pregnant breast-feeding How should I use this medication? Take this medicine by mouth with a  glass of water. Follow the directions on the prescription label. Do not take with grapefruit juice. You can take it with or without food. If it upsets your stomach, take it with food. Take your medicine at regular intervals. Do not take it more often than directed. Do not stop taking except on your doctor's advice. A special MedGuide will be given to you by the pharmacist with each prescription and refill. Be sure to read this information carefully each time. Talk to your pediatrician regarding the use of this medicine in children. Special care may be needed. Overdosage: If you think you have taken too much of this medicine contact a poison control center or emergency room at once. NOTE: This medicine is only for you. Do not share this medicine with others. What if I miss a dose? If you miss a dose, skip it. Take your next dose at the normal time. Do not take extra or 2 doses at the same time to make up for the missed dose. What may interact with this medication? Do not take this medicine with any of the following medications: cimetidine cisapride dolutegravir dronedarone erdafitinib hydrochlorothiazide ketoconazole megestrol pimozide prochlorperazine thioridazine trimethoprim verapamil This medicine may also interact with the following medications: amiloride cannabinoids certain antibiotics like erythromycin or clarithromycin certain antiviral medicines for HIV or hepatitis certain medicines for depression, anxiety, or psychotic disorders digoxin diltiazem grapefruit juice metformin nefazodone other medicines that prolong the QT interval (an abnormal heart rhythm) quinine triamterene zafirlukast ziprasidone This list may not describe all possible interactions. Give your health care provider a list of all the medicines, herbs, non-prescription  drugs, or dietary supplements you use. Also tell them if you smoke, drink alcohol, or use illegal drugs. Some items may interact with  your medicine. What should I watch for while using this medication? Your condition will be monitored carefully while you are receiving this medicine. What side effects may I notice from receiving this medication? Side effects that you should report to your doctor or health care professional as soon as possible: allergic reactions like skin rash, itching or hives, swelling of the face, lips, or tongue breathing problems chest pain or chest tightness dizziness signs and symptoms of a dangerous change in heartbeat or heart rhythm like chest pain; dizziness; fast or irregular heartbeat; palpitations; feeling faint or lightheaded, falls; breathing problems signs and symptoms of electrolyte imbalance like severe diarrhea, unusual sweating, vomiting, loss of appetite, increased thirst swelling of the ankles, legs, or feet tingling, numbness in the hands or feet Side effects that usually do not require medical attention (report to your doctor or health care professional if they continue or are bothersome): diarrhea general ill feeling or flu-like symptoms headache nausea trouble sleeping stomach pain This list may not describe all possible side effects. Call your doctor for medical advice about side effects. You may report side effects to FDA at 1-800-FDA-1088. Where should I keep my medication? Keep out of the reach of children. Store at room temperature between 15 and 30 degrees C (59 and 86 degrees F). Throw away any unused medicine after the expiration date. NOTE: This sheet is a summary. It may not cover all possible information. If you have questions about this medicine, talk to your doctor, pharmacist, or health care provider.  2022 Elsevier/Gold Standard (2021-08-03 00:00:00)     Tikosyn (Dofetilide) Hospital Admission  Prior to day of admission: Check with drug insurance company for cost of drug to ensure affordability --- Dofetilide 500 mcg twice a day.  GoodRx is an option if  insurance copay is unaffordable.  All patients are tested for COVID-19 prior to admission.  No Benadryl is allowed 3 days prior to admission.  Please ensure no missed doses of your anticoagulation (blood thinner) for 3 weeks prior to admission. If a dose is missed please notify our office immediately.  A pharmacist will review all your medications for potential interactions with Tikosyn. If any medication changes are needed prior to admission we will be in touch with you.  If any new medications are started AFTER your admission date is set with Nurse, adult. Please notify our office immediately so your medication list can be updated and reviewed by our pharmacist again. On day of admission: Tikosyn initiation requires a 3 night/4 day hospital stay with constant telemetry monitoring. You will have an EKG after each dose of Tikosyn as well as daily lab draws.  If the drug does not convert you to normal rhythm a cardioversion after the 4th dose of Tikosyn.  [facility] office visit on the morning of admission is needed for preliminary labs/ekg.  Time of admission is dependent on bed availability in the hospital. In some instances, you will be sent home until bed is available. Rarely admission can be delayed to the following day if hospital census prevents available beds.  You may bring personal belongings/clothing with you to the hospital. Please leave your suitcase in the car until you arrive in admissions.  Questions please call our office at [phone]      Cardiac Ablation Cardiac ablation is a procedure to destroy (ablate) some heart tissue  that is sending bad signals. These bad signals cause problems in heart rhythm. The heart has many areas that make these signals. If there are problems in these areas, they can make the heart beat in a way that is not normal. Destroying some tissues can help make the heart rhythm normal. Tell your doctor about: Any allergies you have. All medicines  you are taking. These include vitamins, herbs, eye drops, creams, and over-the-counter medicines. Any problems you or family members have had with medicines that make you fall asleep (anesthetics). Any blood disorders you have. Any surgeries you have had. Any medical conditions you have, such as kidney failure. Whether you are pregnant or may be pregnant. What are the risks? This is a safe procedure. But problems may occur, including: Infection. Bruising and bleeding. Bleeding into the chest. Stroke or blood clots. Damage to nearby areas of your body. Allergies to medicines or dyes. The need for a pacemaker if the normal system is damaged. Failure of the procedure to treat the problem. What happens before the procedure? Medicines Ask your doctor about: Changing or stopping your normal medicines. This is important. Taking aspirin and ibuprofen. Do not take these medicines unless your doctor tells you to take them. Taking other medicines, vitamins, herbs, and supplements. General instructions Follow instructions from your doctor about what you cannot eat or drink. Plan to have someone take you home from the hospital or clinic. If you will be going home right after the procedure, plan to have someone with you for 24 hours. Ask your doctor what steps will be taken to prevent infection. What happens during the procedure?  An IV tube will be put into one of your veins. You will be given a medicine to help you relax. The skin on your neck or groin will be numbed. A cut (incision) will be made in your neck or groin. A needle will be put through your cut and into a large vein. A tube (catheter) will be put into the needle. The tube will be moved to your heart. Dye may be put through the tube. This helps your doctor see your heart. Small devices (electrodes) on the tube will send out signals. A type of energy will be used to destroy some heart tissue. The tube will be taken out. Pressure  will be held on your cut. This helps stop bleeding. A bandage will be put over your cut. The exact procedure may vary among doctors and hospitals. What happens after the procedure? You will be watched until you leave the hospital or clinic. This includes checking your heart rate, breathing rate, oxygen, and blood pressure. Your cut will be watched for bleeding. You will need to lie still for a few hours. Do not drive for 24 hours or as long as your doctor tells you. Summary Cardiac ablation is a procedure to destroy some heart tissue. This is done to treat heart rhythm problems. Tell your doctor about any medical conditions you may have. Tell him or her about all medicines you are taking to treat them. This is a safe procedure. But problems may occur. These include infection, bruising, bleeding, and damage to nearby areas of your body. Follow what your doctor tells you about food and drink. You may also be told to change or stop some of your medicines. After the procedure, do not drive for 24 hours or as long as your doctor tells you. This information is not intended to replace advice given to you by your  health care provider. Make sure you discuss any questions you have with your health care provider. Document Revised: 10/17/2019 Document Reviewed: 10/17/2019 Elsevier Patient Education  2022 [employer].

## 2021-12-22 ENCOUNTER — Telehealth: Payer: Self-pay | Admitting: Cardiology

## 2021-12-22 NOTE — Telephone Encounter (Signed)
Patient is calling wanting to know how much of a risk there is for blood clots even though he is on coumadin. Please advise.

## 2021-12-22 NOTE — Telephone Encounter (Signed)
Aware will have Dr. Curt Bears review and advise via mychart per pt request. Patient verbalized understanding and agreeable to plan.

## 2021-12-27 DIAGNOSIS — L853 Xerosis cutis: Secondary | ICD-10-CM | POA: Diagnosis not present

## 2021-12-27 DIAGNOSIS — M2041 Other hammer toe(s) (acquired), right foot: Secondary | ICD-10-CM | POA: Diagnosis not present

## 2021-12-27 DIAGNOSIS — E1151 Type 2 diabetes mellitus with diabetic peripheral angiopathy without gangrene: Secondary | ICD-10-CM | POA: Diagnosis not present

## 2021-12-27 DIAGNOSIS — B351 Tinea unguium: Secondary | ICD-10-CM | POA: Diagnosis not present

## 2021-12-27 DIAGNOSIS — M2042 Other hammer toe(s) (acquired), left foot: Secondary | ICD-10-CM | POA: Diagnosis not present

## 2021-12-29 ENCOUNTER — Ambulatory Visit: Payer: Medicare Other | Admitting: Cardiovascular Disease

## 2021-12-29 DIAGNOSIS — R059 Cough, unspecified: Secondary | ICD-10-CM | POA: Diagnosis not present

## 2021-12-29 DIAGNOSIS — J029 Acute pharyngitis, unspecified: Secondary | ICD-10-CM | POA: Diagnosis not present

## 2021-12-29 DIAGNOSIS — R519 Headache, unspecified: Secondary | ICD-10-CM | POA: Diagnosis not present

## 2021-12-29 DIAGNOSIS — Z20822 Contact with and (suspected) exposure to covid-19: Secondary | ICD-10-CM | POA: Diagnosis not present

## 2021-12-29 DIAGNOSIS — R5383 Other fatigue: Secondary | ICD-10-CM | POA: Diagnosis not present

## 2021-12-29 DIAGNOSIS — R6883 Chills (without fever): Secondary | ICD-10-CM | POA: Diagnosis not present

## 2021-12-29 DIAGNOSIS — J069 Acute upper respiratory infection, unspecified: Secondary | ICD-10-CM | POA: Diagnosis not present

## 2022-01-07 ENCOUNTER — Other Ambulatory Visit: Payer: Self-pay | Admitting: Cardiovascular Disease

## 2022-01-07 DIAGNOSIS — Z7901 Long term (current) use of anticoagulants: Secondary | ICD-10-CM | POA: Diagnosis not present

## 2022-01-07 DIAGNOSIS — J159 Unspecified bacterial pneumonia: Secondary | ICD-10-CM | POA: Diagnosis not present

## 2022-01-20 DIAGNOSIS — Z7901 Long term (current) use of anticoagulants: Secondary | ICD-10-CM | POA: Diagnosis not present

## 2022-01-20 DIAGNOSIS — I4891 Unspecified atrial fibrillation: Secondary | ICD-10-CM | POA: Diagnosis not present

## 2022-01-28 NOTE — Telephone Encounter (Signed)
Followed up with patient. Apologized for delay as this got lost somehow. ?Question answered ?Patient verbalized understanding and agreeable to plan.  ? ?

## 2022-02-18 ENCOUNTER — Telehealth: Payer: Self-pay | Admitting: Cardiovascular Disease

## 2022-02-18 DIAGNOSIS — G4733 Obstructive sleep apnea (adult) (pediatric): Secondary | ICD-10-CM

## 2022-02-18 NOTE — Telephone Encounter (Signed)
Patient wants to know if in his records there is documentation that Dr. Gwenlyn Found want him to have a sleep study.   ?

## 2022-02-18 NOTE — Telephone Encounter (Signed)
-  Pt state Dr. Gwenlyn Found recommended a sleep study a few years ago but pt was always hesitant to proceed.  ?-Pt state is now ready to complete sleep study as he wakes up every morning feeling very tired.  ? ?Will forward to MD for approval ?

## 2022-02-21 NOTE — Telephone Encounter (Signed)
Pt updated and sleep order placed.  ? ?Lorretta Harp, MD  You; Beatrix Fetters, RN 2 days ago  ? ?We are happy to order a sleep study on him or refer him to Dr Claiborne Billings for OSA eval   ? ?

## 2022-02-24 ENCOUNTER — Telehealth: Payer: Self-pay | Admitting: *Deleted

## 2022-02-24 NOTE — Telephone Encounter (Signed)
Patient notified of in lab sleep study appointment details. ?

## 2022-03-01 DIAGNOSIS — Z7901 Long term (current) use of anticoagulants: Secondary | ICD-10-CM | POA: Diagnosis not present

## 2022-03-01 DIAGNOSIS — I4891 Unspecified atrial fibrillation: Secondary | ICD-10-CM | POA: Diagnosis not present

## 2022-03-15 ENCOUNTER — Ambulatory Visit (INDEPENDENT_AMBULATORY_CARE_PROVIDER_SITE_OTHER): Payer: Medicare Other | Admitting: Cardiovascular Disease

## 2022-03-15 ENCOUNTER — Encounter: Payer: Self-pay | Admitting: Cardiovascular Disease

## 2022-03-15 DIAGNOSIS — I10 Essential (primary) hypertension: Secondary | ICD-10-CM

## 2022-03-15 DIAGNOSIS — E782 Mixed hyperlipidemia: Secondary | ICD-10-CM

## 2022-03-15 DIAGNOSIS — I25810 Atherosclerosis of coronary artery bypass graft(s) without angina pectoris: Secondary | ICD-10-CM

## 2022-03-15 DIAGNOSIS — I480 Paroxysmal atrial fibrillation: Secondary | ICD-10-CM

## 2022-03-15 DIAGNOSIS — G4733 Obstructive sleep apnea (adult) (pediatric): Secondary | ICD-10-CM | POA: Diagnosis not present

## 2022-03-15 NOTE — Assessment & Plan Note (Signed)
History of persistent A-fib rate controlled on warfarin oral anticoagulation.  I did refer him to Dr. Curt Bears who offered him ablation versus dofetilide therapy.  He did emphasize the importance of weight loss and sleep apnea both of which he is pursuing prior to deciding whether or not to have alternative A-fib therapies. ?

## 2022-03-15 NOTE — Patient Instructions (Signed)

## 2022-03-15 NOTE — Progress Notes (Signed)
? ? ? ?03/15/2022 ?Eduardo Butler   ?1951-02-11  ?[id number] ? ?Primary Physician Orpah Melter, MD ?Primary Cardiologist: Lorretta Harp MD [address] [address], [place], [place] ? ?HPI:  Eduardo Butler is a 71 y.o.  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 [name] follows. His other problems include hyperlipidemia and erectile dysfunction. I catheterized him at the [facility] September 28th , 2007 revealing patent grafts with normal LV function .  This suggested that he had a false positive Myoview stress test and medical therapy was recommended.  His last Myoview performed 01/04/13 was nonischemic Dr. Doyle Askew follows his lipid profile. When I saw him 2 months ago he was complaining of dyspnea on exertion. He saw one of our PAs a month later with increasing dyspnea on exertion. His diastolic was cut in half but remains bradycardic although he was clinically improved. His when necessary hydrochlorothiazide was changed to when necessary Lasix which she took over the weekend for several doses which resulted in improved dyspnea and decreased edema. I did discuss with him today so restriction. A 2-D echo was ordered that showed normal FUNCTION in a Myoview stress test that showed apical thinning with new ischemia in the inferior wall compared to a previous Myoview 2 years ago. Since I saw him 3 months ago he continued to do well.  He says he is stronger.  I did adjust his diuretics which improved his lower extremity edema.  He also admits to dietary indiscretion.  He denies  chest pain does have chronic dyspnea on exertion. ?  ?He has seen Almyra Deforest PA-C in the office 03/29/2020...  His major complaint is of dyspnea.  He has not lost any weight.  He denies chest pain.  An echo performed 03/07/2018 was completely normal.  He has been more dyspneic and weaker since I saw him back in December.  A Myoview stress test ordered by Almyra Deforest 04/08/2020 showed subtle anterior ischemia only mildly different from the last Myoview performed 7/17.  However, given the fact that his last cath was 14 years ago I decided to perform outpatient cardiac catheterization on 04/23/2020 revealing patent grafts.  2D echo revealed normal LV function. ?  ?He  was seen in the ER 09/13/2020 with dyspnea with negative work-up.  Chest x-ray showed no active disease.  He saw Dr. Doyle Askew in the office on 10/16/2020 and an EKG on that date did show atrial fibrillation.  He remains on warfarin anticoagulation.  His hemoglobin was apparently in the mid 12 range. ?  ?He had an event monitor that showed predominantly sinus rhythm with PACs, PVCs, short runs of SVT and not as VT as well as runs of PAF with slow ventricular response.  He is on warfarin oral anticoagulation. ? ?Since I saw him in the office 6 months ago he continues to do well.  I did refer him to Dr. Curt Bears for evaluation of A-fib who discussed A-fib ablation versus dofetilide.  They decided to pursue weight reduction and evaluation for sleep apnea to begin with.  He feels much better in sinus rhythm.  He is on warfarin and maintains INR is in therapeutic window.  He has lost 10 pounds since I saw him. ? ?Current Meds  ?Medication Sig  ? acetaminophen (TYLENOL) 500 MG tablet Take 1,000-1,500 mg by mouth every 6 (six) hours as needed for moderate pain.  ? albuterol (VENTOLIN HFA) 108 (90 Base) MCG/ACT inhaler Inhale 2 puffs into the lungs every 6 (six) hours as needed for shortness of breath.   ? ALPRAZolam (XANAX) 0.5 MG tablet Take 0.5 mg by mouth daily as needed  for anxiety or sleep.   ? ammonium lactate (LAC-HYDRIN) 12 % lotion Apply 1 application. topically 2 (two) times daily.  ? Ascorbic Acid (VITAMIN C) 1000 MG tablet Take 1,000 mg by mouth daily.  ? aspirin EC 81 MG tablet Take 81 mg by mouth daily.  ? carvedilol (COREG) 6.25 MG tablet Take 1 tablet (6.25 mg total) by mouth 2 (two) times daily with a meal.  ? cetirizine (ZYRTEC) 10 MG tablet Take 1 tablet (10 mg total) by mouth once as needed for up to 1 dose for allergies or rhinitis.  ? CINNAMON PO Take 1,000 mg by mouth daily.   ? Elastic Bandages & Supports (MEDICAL COMPRESSION STOCKINGS) MISC Knee High 20-30 mm compression stockings  ? ezetimibe (ZETIA) 10 MG tablet Take 1 tablet (10 mg total) by mouth daily.  ? fluticasone (FLONASE) 50 MCG/ACT nasal spray Place 1 spray into both nostrils daily as needed for allergies or rhinitis.  ? folic acid (FOLVITE) 1 MG tablet Take 1 mg by mouth daily.  ? furosemide (LASIX) 80 MG tablet TAKE 1 TABLET(80 MG) BY MOUTH DAILY  ? HYDROcodone-acetaminophen (NORCO/VICODIN) 5-325 MG tablet Take 1 tablet by mouth every 6 (six) hours as needed.  ? losartan (COZAAR) 25 MG tablet TAKE 1 TABLET(25 MG) BY MOUTH DAILY  ? metFORMIN (GLUCOPHAGE) 1000 MG tablet Take 1,000 mg by mouth 2 (two) times daily with a meal.  ? Misc Natural Products (IMMUNE FORMULA PO) Take 1 tablet by mouth daily. Immune: Vitamin C, D and Zinc. Chew 1 tablets daily.  ? omeprazole (PRILOSEC) 40 MG capsule TAKE ONE CAPSULE BY MOUTH DAILY AS NEEDED FOR HEARTBURN  ? pioglitazone (ACTOS) 45 MG tablet Take 45 mg by mouth daily.   ? simvastatin (ZOCOR) 40 MG tablet Take 1 tablet (40 mg total) by mouth daily at 6 PM.  ? warfarin (COUMADIN) 4 MG tablet Take 4 mg by mouth See admin instructions. Take Sunday and thursday  ? warfarin (COUMADIN) 5 MG tablet Take 5 mg by mouth See admin instructions. Take on Monday, Tuesday, Wednesday, Friday and Saturday  ? ?Current Facility-Administered Medications for the 03/15/22 encounter  (Office Visit) with Lorretta Harp, MD  ?Medication  ? sodium chloride flush (NS) 0.9 % injection 3 mL  ?  ? ?Allergies  ?Allergen Reactions  ? Morphine And Related   ?  hallucinations ?Other reaction(s): Hallucinations, hallucinations, Other ?Other reaction(s): Delusions (intolerance), Other ?hallucinations ?hallucinations ?hallucinations ?hallucinations ?hallucinations ?hallucinations ?  ? ? ?Social History  ? ?Socioeconomic History  ? Marital status: Married  ?  Spouse name: Not on file  ? Number of children: Not on file  ? Years of education: Not on file  ? Highest education level: Not on file  ?Occupational History  ? Not on file  ?Tobacco Use  ? Smoking status: Never  ? Smokeless tobacco: Never  ?Substance and Sexual Activity  ? Alcohol use: No  ?  Comment: none since 1979  ? Drug use: No  ? Sexual activity: Not on file  ?Other Topics Concern  ? Not on file  ?Social History Narrative  ? Not on file  ? ?Social Determinants of Health  ? ?Financial Resource Strain: Not on file  ?Food Insecurity: Not on file  ?Transportation Needs: Not on file  ?Physical Activity: Not on file  ?Stress: Not on file  ?Social Connections: Not on file  ?Intimate Partner Violence: Not on file  ?  ? ?Review of Systems: ?General: negative for chills, fever, night sweats or weight changes.  ?Cardiovascular: negative for chest pain, dyspnea on exertion, edema, orthopnea, palpitations, paroxysmal nocturnal dyspnea or shortness of breath ?Dermatological: negative for rash ?Respiratory: negative for cough or wheezing ?Urologic: negative for hematuria ?Abdominal: negative for nausea, vomiting, diarrhea, bright red blood per rectum, melena, or hematemesis ?Neurologic: negative for visual changes, syncope, or dizziness ?All other systems reviewed and are otherwise negative except as noted above. ? ? ? ?Blood pressure 132/66, pulse 84, height '5\' 6"'$  (1.676 m), weight 258 lb 12.8 oz (117.4 kg), SpO2 97 %.  ?General appearance: alert and no  distress ?Neck: no adenopathy, no carotid bruit, no JVD, supple, symmetrical, trachea midline, and thyroid not enlarged, symmetric, no tenderness/mass/nodules ?Lungs: clear to auscultation bilaterally ?Heart: irre

## 2022-03-15 NOTE — Assessment & Plan Note (Signed)
History of CAD status post CABG March 2003 with a LIMA to the LAD, free radial to the circumflex and sequential vein to an acute marginal branch and distal RCA.  I performed cardiac catheterization sedation on him 04/23/2020 revealing patent grafts and normal LV function.  He denies chest pain but occasionally gets short of breath. ?

## 2022-03-15 NOTE — Assessment & Plan Note (Signed)
History of essential hypertension a blood pressure measured today at 132/66.  He is on losartan and carvedilol. ?

## 2022-03-15 NOTE — Assessment & Plan Note (Signed)
History of hyperlipidemia on Zetia and statin therapy with lipid profile performed 11/24/2021 revealing total cholesterol of 128, LDL 59 and HDL 47. ?

## 2022-03-15 NOTE — Assessment & Plan Note (Signed)
History of obstructive sleep apnea scheduled to have an outpatient sleep study in the near future. ?

## 2022-03-25 ENCOUNTER — Ambulatory Visit (HOSPITAL_BASED_OUTPATIENT_CLINIC_OR_DEPARTMENT_OTHER): Payer: Medicare Other | Attending: Cardiovascular Disease | Admitting: Cardiovascular Disease

## 2022-03-25 DIAGNOSIS — G4736 Sleep related hypoventilation in conditions classified elsewhere: Secondary | ICD-10-CM | POA: Insufficient documentation

## 2022-03-25 DIAGNOSIS — G4733 Obstructive sleep apnea (adult) (pediatric): Secondary | ICD-10-CM | POA: Diagnosis not present

## 2022-04-01 ENCOUNTER — Telehealth: Payer: Self-pay | Admitting: *Deleted

## 2022-04-01 NOTE — Telephone Encounter (Signed)
Returned a call to the patient to inform him that Dr Claiborne Billings was on vacation last week so the study as not been interpreted yet. Once he has given them to me I will give him a call with the results and recommendations. Patient voiced his understanding. ?

## 2022-04-01 NOTE — Telephone Encounter (Signed)
Patient calling for sleep study results.  ?

## 2022-04-03 ENCOUNTER — Encounter (HOSPITAL_BASED_OUTPATIENT_CLINIC_OR_DEPARTMENT_OTHER): Payer: Self-pay | Admitting: Cardiovascular Disease

## 2022-04-03 NOTE — Procedures (Signed)
? ? ? ?Patient Name: Eduardo Butler, Eduardo Butler ?Study Date: 03/25/2022 ?Gender: Male ?D.O.B: 1951-06-08 ?Age (years): 17 ?Referring Provider: Lorretta Harp ?Height (inches): 66 ?Interpreting Physician: Shelva Majestic MD, ABSM ?Weight (lbs): 260 ?RPSGT: Baxter Flattery ?BMI: 42 ?MRN: [id number] ?Neck Size: 19.00 ?<br> <br> ?CLINICAL INFORMATION ?Sleep Study Type: NPSG ? ?Indication for sleep study: Obesity, Snoring, Witnesses Apnea / Gasping During Sleep ? ?Epworth Sleepiness Score: 8 ? ?SLEEP STUDY TECHNIQUE ?As per the AASM Manual for the Scoring of Sleep and Associated Events v2.3 (April 2016) with a hypopnea requiring 4% desaturations. ? ?The channels recorded and monitored were frontal, central and occipital EEG, electrooculogram (EOG), submentalis EMG (chin), nasal and oral airflow, thoracic and abdominal wall motion, anterior tibialis EMG, snore microphone, electrocardiogram, and pulse oximetry. ? ?MEDICATIONS ?acetaminophen (TYLENOL) 500 MG tablet ?albuterol (VENTOLIN HFA) 108 (90 Base) MCG/ACT inhaler ?ALPRAZolam (XANAX) 0.5 MG tablet ?ammonium lactate (LAC-HYDRIN) 12 % lotion ?aspirin EC 81 MG tablet ?carvedilol (COREG) 6.25 MG tablet ?cetirizine (ZYRTEC) 10 MG tablet ?CINNAMON PO ?Elastic Bandages & Supports (MEDICAL COMPRESSION STOCKINGS) MISC ?ezetimibe (ZETIA) 10 MG tablet ?fluticasone (FLONASE) 50 MCG/ACT nasal spray ?folic acid (FOLVITE) 1 MG tablet ?furosemide (LASIX) 80 MG tablet ?HYDROcodone-acetaminophen (NORCO/VICODIN) 5-325 MG tablet ?losartan (COZAAR) 25 MG tablet ?metFORMIN (GLUCOPHAGE) 1000 MG tablet ?Misc Natural Products (IMMUNE FORMULA PO) ?omeprazole (PRILOSEC) 40 MG capsule ?pioglitazone (ACTOS) 45 MG tablet ?simvastatin (ZOCOR) 40 MG tablet ?traZODone (DESYREL) 100 MG tablet ?warfarin (COUMADIN) 4 MG tablet ?warfarin (COUMADIN) 5 MG tablet ?Medications self-administered by patient taken the night of the study : ZOLPIDEM TARTRATE ? ?SLEEP ARCHITECTURE ?The study was initiated at 11:26:58 PM and  ended at 5:24:05 AM. ? ?Sleep onset time was 17.1 minutes and the sleep efficiency was 63.8%%. The total sleep time was 228 minutes. ? ?Stage REM latency was 87.0 minutes. ? ?The patient spent 5.3%% of the night in stage N1 sleep, 79.8%% in stage N2 sleep, 0.0%% in stage N3 and 14.9% in REM. ? ?Alpha intrusion was absent. ? ?Supine sleep was 8.28%. ? ?RESPIRATORY PARAMETERS ?The overall apnea/hypopnea index (AHI) was 5.0 per hour. There were 0 total apneas, including 0 obstructive, 0 central and 0 mixed apneas. There were 19 hypopneas and 0 RERAs. ? ?The AHI during Stage REM sleep was 21.2 per hour. ? ?AHI while supine was 0.0 per hour. ? ?The mean oxygen saturation was 91.3%. The minimum SpO2 during sleep was 77.0%. ? ?No snoring was noted during this study. ? ?CARDIAC DATA ?The 2 lead EKG demonstrated sinus rhythm. The mean heart rate was 56.8 beats per minute. Other EKG findings include: None. ? ?LEG MOVEMENT DATA ?The total PLMS were 0 with a resulting PLMS index of 0.0. Associated arousal with leg movement index was 0.0 . ? ?IMPRESSIONS ?- Mild obstructive sleep apnea overall (AHI  5.0/h; RDI 5.0/h); however, sleep apnea was moderate during REM sleep (AHI 21.2/h) ?- Significant  oxygen desaturation to a nadir of 77%. ?- No snoring was audible during this study. ?- No cardiac abnormalities were noted during this study. ?- Clinically significant periodic limb movements did not occur during sleep. No significant associated arousals. ? ?DIAGNOSIS ?- Obstructive Sleep Apnea (G47.33) ?- Nocturnal Hypoxemia (G47.36) ? ?RECOMMENDATIONS ?- In this patient with cardiovascular comorbidities, moderate sleep apnea during REM sleep with significant oxygen desaturation, recommend therapeutic CPAP titration for optimal treatment of his sleep disordered breathing. If unable to obtain an in-lab titration, initiate a trial of Auto-PAP with EPR of 3 at 6 - 16 cm of water. ?-  Effort should be made to optimize nasal and  oropharyngeal patency.  ?- Avoid alcohol, sedatives and other CNS depressants that may worsen sleep apnea and disrupt normal sleep architecture. ?- Sleep hygiene should be reviewed to assess factors that may improve sleep quality. ?- Weight management (BMI 42) and regular exercise should be initiated or continued if appropriate. ? ?[Electronically signed] 04/03/2022 12:57 PM ? ?Shelva Majestic MD, [facility], ABSM ?Diplomate, Tax adviser of Sleep Medicine ? ?NPI: [id number] ? ?[place] ?PH: [phone]   FX: [phone] ?ACCREDITED BY THE AMERICAN ACADEMY OF SLEEP MEDICINE ? ?

## 2022-04-05 ENCOUNTER — Telehealth: Payer: Self-pay | Admitting: *Deleted

## 2022-04-05 ENCOUNTER — Other Ambulatory Visit: Payer: Self-pay | Admitting: Cardiovascular Disease

## 2022-04-05 DIAGNOSIS — G4733 Obstructive sleep apnea (adult) (pediatric): Secondary | ICD-10-CM

## 2022-04-05 DIAGNOSIS — G4736 Sleep related hypoventilation in conditions classified elsewhere: Secondary | ICD-10-CM

## 2022-04-05 NOTE — Telephone Encounter (Signed)
-----   Message from Troy Sine, MD sent at 04/03/2022  1:02 PM EDT ----- ?Eduardo Butler, pleasenotify pt of results, and set up Auto-PAP or titration study. ?

## 2022-04-05 NOTE — Telephone Encounter (Signed)
Patient notified of  NPSG results and recommendations. He agrees to proceed with CPAP titration. ? ?

## 2022-04-07 ENCOUNTER — Telehealth: Payer: Self-pay | Admitting: Cardiovascular Disease

## 2022-04-07 NOTE — Telephone Encounter (Signed)
Pt would like for Mariann Laster to give him a call. Please advise ?

## 2022-04-07 NOTE — Telephone Encounter (Signed)
Returned a call to the patient. He is concerned that no one has called him to schedule his CPAP titration. I told him they typically do not call within 24 hours. His order was just placed yesterday. He can call them to schedule. After the call ended I called and spoke with Sharee Pimple to scheduled the patient. He was scheduled for June 12th. He was also placed on a cancellation list as he is anxious about his O2 dropping. ?

## 2022-04-08 DIAGNOSIS — B351 Tinea unguium: Secondary | ICD-10-CM | POA: Diagnosis not present

## 2022-04-08 DIAGNOSIS — E1151 Type 2 diabetes mellitus with diabetic peripheral angiopathy without gangrene: Secondary | ICD-10-CM | POA: Diagnosis not present

## 2022-04-12 DIAGNOSIS — Z23 Encounter for immunization: Secondary | ICD-10-CM | POA: Diagnosis not present

## 2022-04-12 DIAGNOSIS — I4891 Unspecified atrial fibrillation: Secondary | ICD-10-CM | POA: Diagnosis not present

## 2022-04-12 DIAGNOSIS — Z7901 Long term (current) use of anticoagulants: Secondary | ICD-10-CM | POA: Diagnosis not present

## 2022-05-09 ENCOUNTER — Ambulatory Visit (HOSPITAL_BASED_OUTPATIENT_CLINIC_OR_DEPARTMENT_OTHER): Payer: Medicare Other | Attending: Cardiovascular Disease | Admitting: Cardiovascular Disease

## 2022-05-09 DIAGNOSIS — G4736 Sleep related hypoventilation in conditions classified elsewhere: Secondary | ICD-10-CM | POA: Diagnosis not present

## 2022-05-09 DIAGNOSIS — G4733 Obstructive sleep apnea (adult) (pediatric): Secondary | ICD-10-CM | POA: Diagnosis not present

## 2022-05-16 ENCOUNTER — Telehealth: Payer: Self-pay | Admitting: Cardiovascular Disease

## 2022-05-16 NOTE — Telephone Encounter (Signed)
*  STAT* If patient is at the pharmacy, call can be transferred to refill team.   1. Which medications need to be refilled? (please list name of each medication and dose if known) omeprazole (PRILOSEC) 40 MG capsule  2. Which pharmacy/location (including street and city if local pharmacy) is medication to be sent to? [id number] - [place], [place] - [address]  3. Do they need a 30 day or 90 day supply? 90 day supply   Patient only has one tablet left.

## 2022-05-17 MED ORDER — OMEPRAZOLE 40 MG PO CPDR: DELAYED_RELEASE_CAPSULE | ORAL | 3 refills | Status: AC

## 2022-05-20 ENCOUNTER — Telehealth: Payer: Self-pay | Admitting: Cardiovascular Disease

## 2022-05-20 NOTE — Telephone Encounter (Signed)
New Message::      Patient called and said patient wants the pharmacy listed in his chart removed. Patient wants his new pharmacy to be [facility] in [place]

## 2022-05-21 ENCOUNTER — Encounter (HOSPITAL_BASED_OUTPATIENT_CLINIC_OR_DEPARTMENT_OTHER): Payer: Self-pay | Admitting: Cardiovascular Disease

## 2022-05-23 ENCOUNTER — Telehealth: Payer: Self-pay | Admitting: *Deleted

## 2022-05-23 NOTE — Telephone Encounter (Signed)
Patient notified CPAP titration has been completed. CPAP machine will be ordered.

## 2022-05-26 IMAGING — MR MR LUMBAR SPINE W/O CM
4 of 5 series · 26 of 48 positions shown · non-contrast
Comparison: 11/25/2018

CLINICAL DATA: Low back pain extending down the right leg

EXAM:
MRI LUMBAR SPINE WITHOUT CONTRAST
TECHNIQUE: Multiplanar, multisequence MR imaging of the lumbar spine was
performed. No intravenous contrast was administered.

[Series 2: T2 · sagittal · 4.0mm · 0.81mm/px · 6 of 15 slices shown (1 of 2)]
[im 1/15]
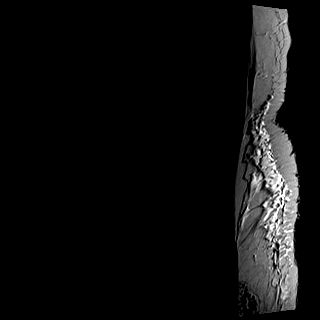
[im 3/15]
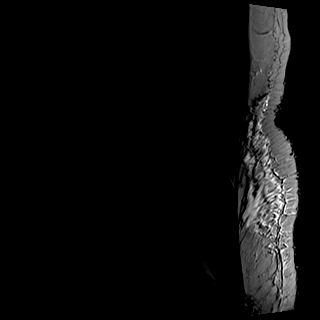
[im 6/15]
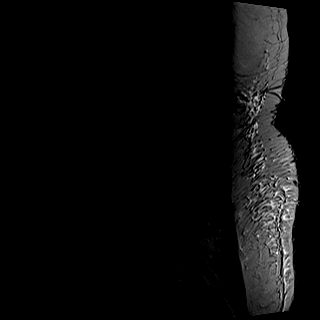
[im 9/15]
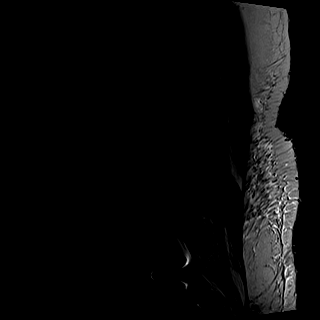
[im 12/15]
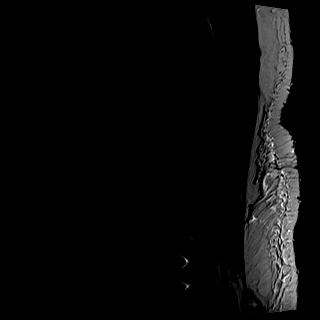
[im 15/15]
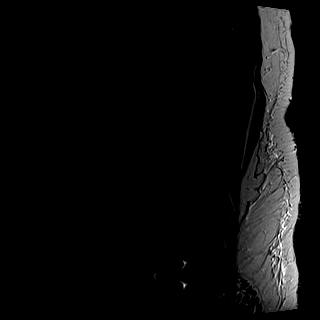

[Series 3: T1 · sagittal · 4.0mm · 0.41mm/px · 6 of 15 slices shown (1 of 2)]
[im 1/15]
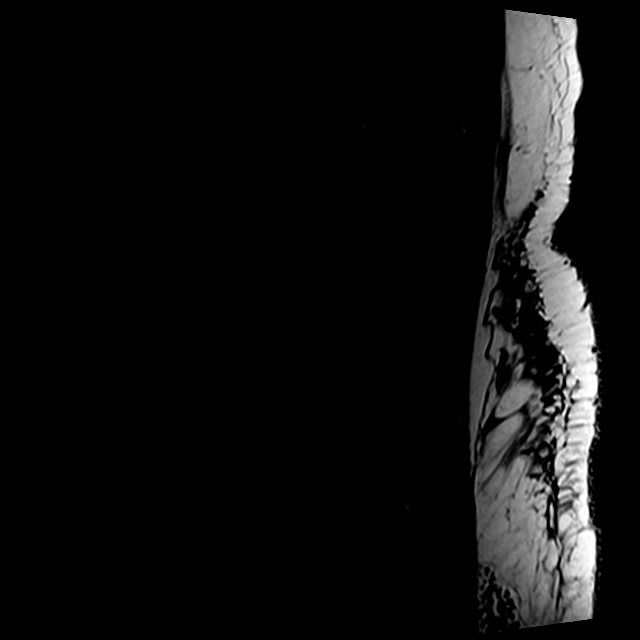
[im 3/15]
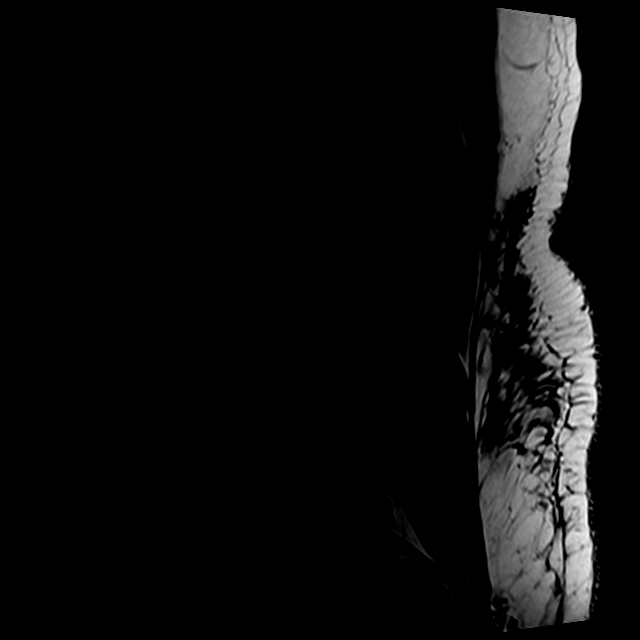
[im 6/15]
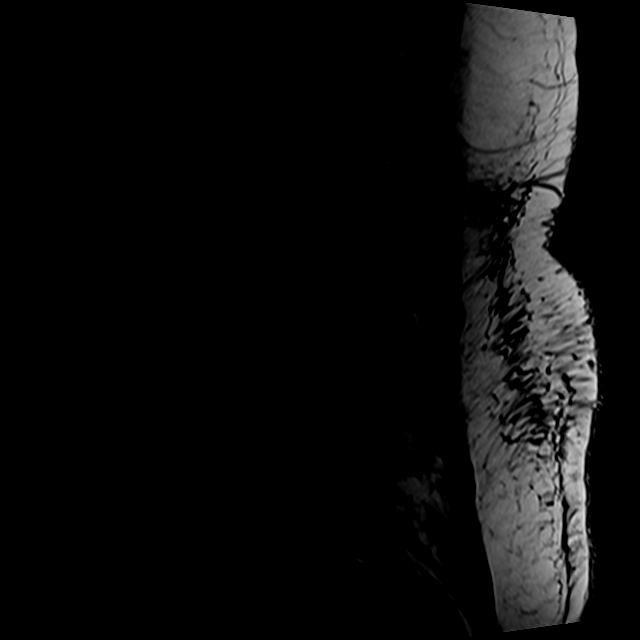
[im 9/15]
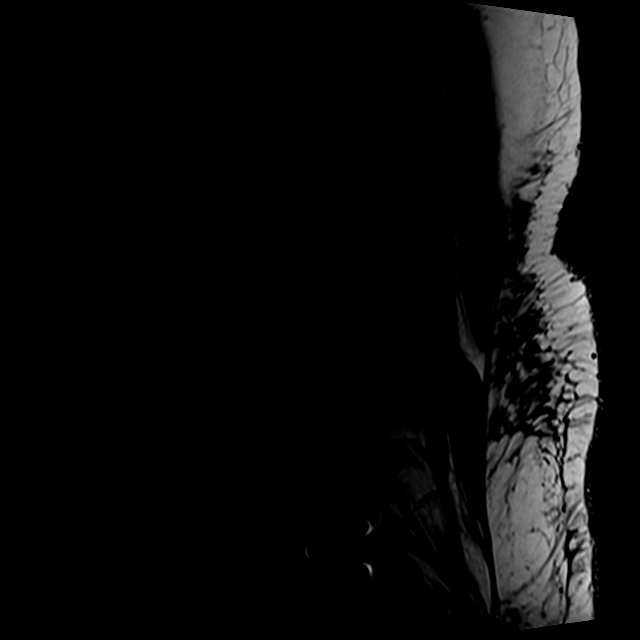
[im 12/15]
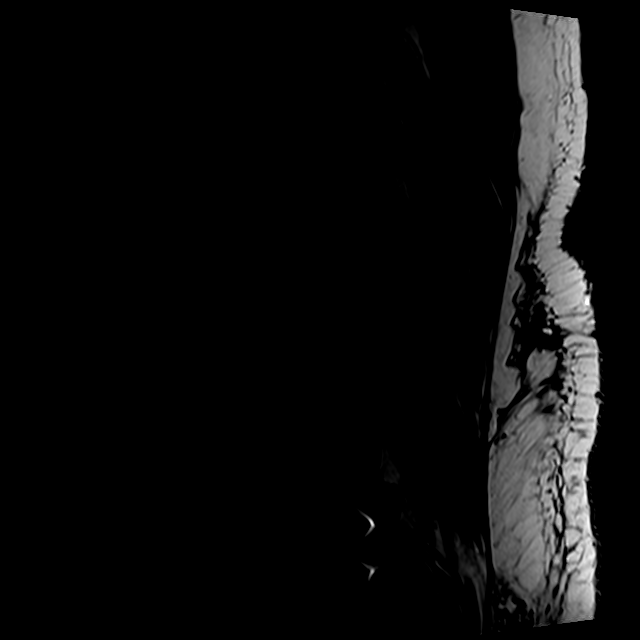
[im 15/15]
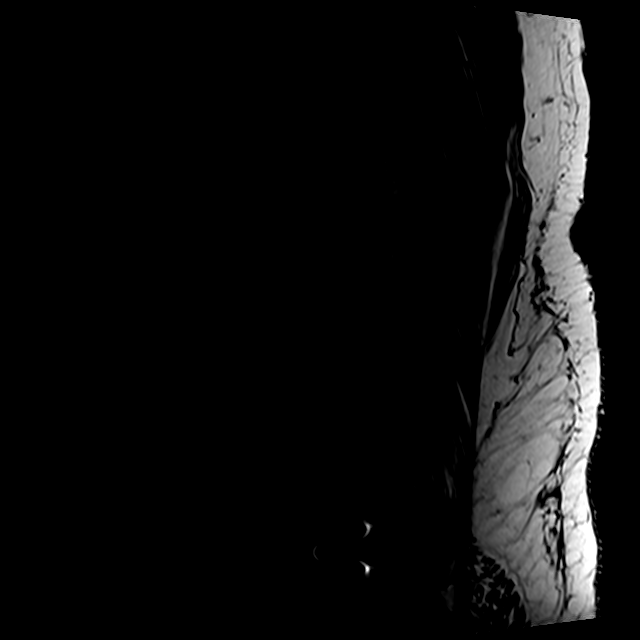

[Series 5: T2 · axial · 4.0mm · 0.78mm/px · z∈[-140,+80]mm · 9 of 41 slices shown (2 of 2)]
[im 1/41]
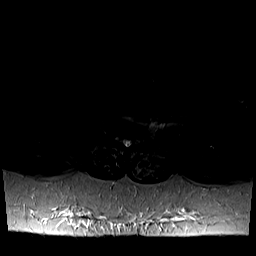
[im 6/41]
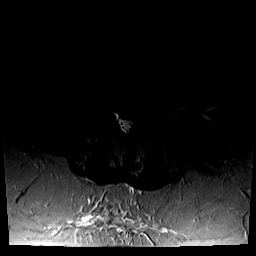
[im 12/41]
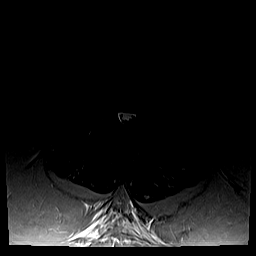
[im 18/41]
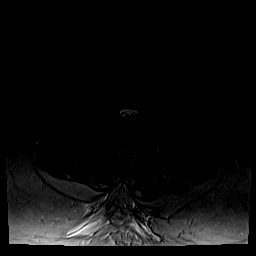
[im 21/41]
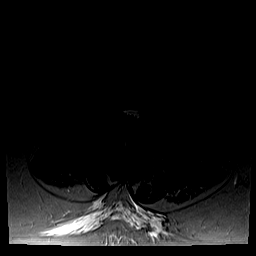
[im 23/41]
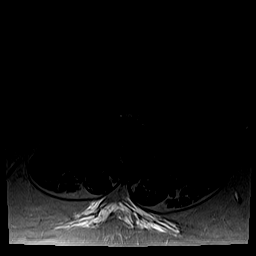
[im 29/41]
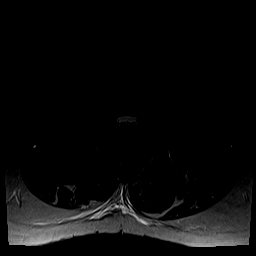
[im 35/41]
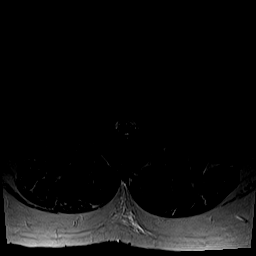
[im 41/41]
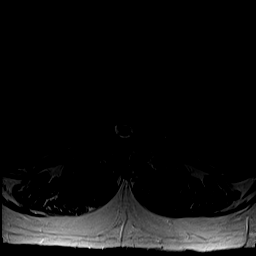

[Series 6: T1 · axial · 4.0mm · 0.39mm/px · z∈[-140,+50]mm · 5 of 41 slices shown (2 of 2)]
[im 1/41]
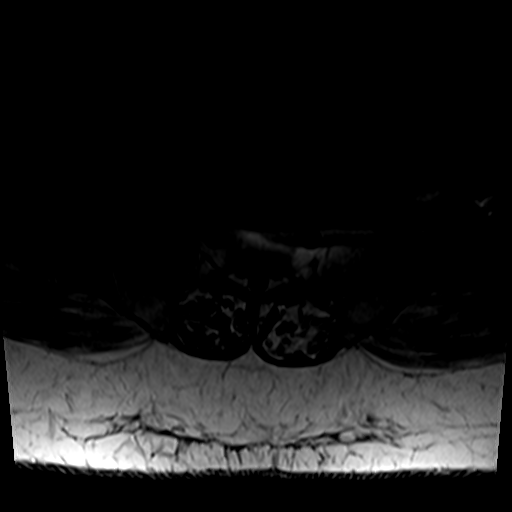
[im 6/41]
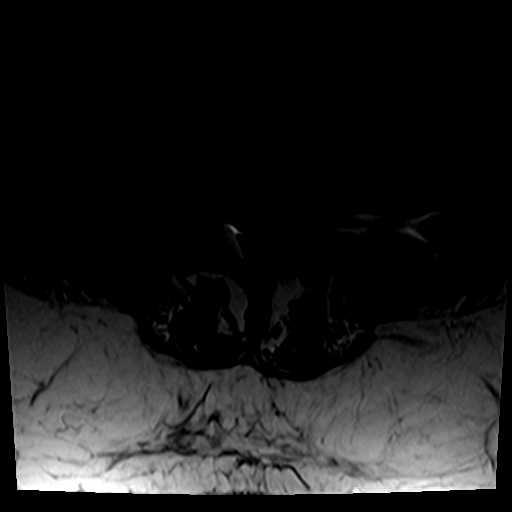
[im 12/41]
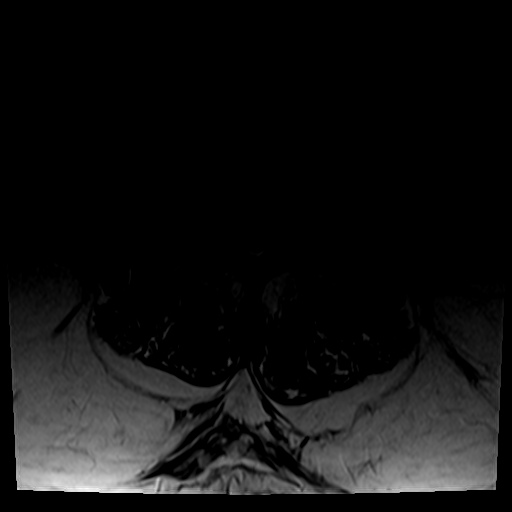
[im 21/41]
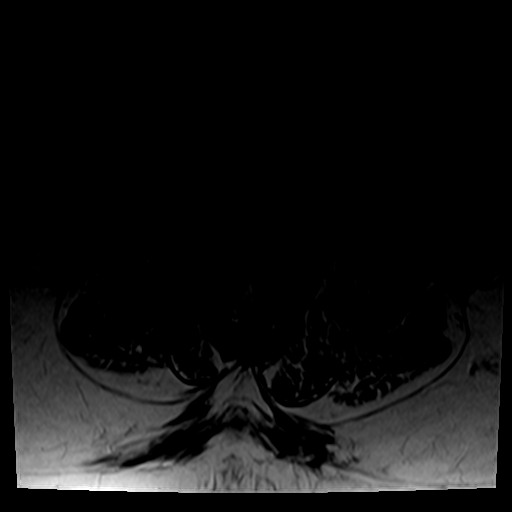
[im 35/41]
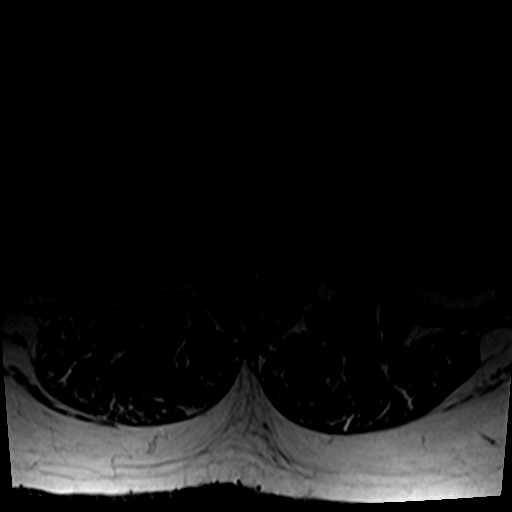

[26 of 48 positions shown; findings below may reference images not displayed]

FINDINGS: Segmentation:  5 lumbar type vertebrae

Alignment:  Borderline retrolisthesis at L2-3.

Vertebrae:  No fracture, evidence of discitis, or bone lesion.

Conus medullaris and cauda equina: Conus extends to the L1-2 level.
Conus and cauda equina appear normal.

Paraspinal and other soft tissues: Sacroiliac screw on the left.

Disc levels:

T12- L1: Unremarkable.

L1-L2: Unremarkable.

L2-L3: Disc narrowing and bulging with endplate ridging. Mild
degenerative facet spurring. Chronic moderate right foraminal
narrowing.

L3-L4: Disc narrowing and bulging with right foraminal extrusion.
Disc herniation migrates superiorly to a mild degree in the right
subarticular recess but does not impinge on the descending L4 nerve
root. This is likely the symptomatic finding

L4-L5: Disc narrowing and left eccentric bulging with left foraminal
protrusion impinging on the traversing L4 nerve root, chronic.
Degenerative facet spurring on both sides. Patent canal

L5-S1:Mild facet spurring.  Maintained disc space.  No impingement
IMPRESSION: 1. L3-4 right foraminal extrusion and L3 impingement.
2. L4-5 chronic left foraminal impingement.
3. L2-3 chronic moderate right foraminal narrowing.

## 2022-06-02 DIAGNOSIS — I82409 Acute embolism and thrombosis of unspecified deep veins of unspecified lower extremity: Secondary | ICD-10-CM | POA: Diagnosis not present

## 2022-06-02 DIAGNOSIS — Z7901 Long term (current) use of anticoagulants: Secondary | ICD-10-CM | POA: Diagnosis not present

## 2022-07-07 DIAGNOSIS — U071 COVID-19: Secondary | ICD-10-CM | POA: Diagnosis not present

## 2022-07-07 DIAGNOSIS — R52 Pain, unspecified: Secondary | ICD-10-CM | POA: Diagnosis not present

## 2022-07-07 DIAGNOSIS — J069 Acute upper respiratory infection, unspecified: Secondary | ICD-10-CM | POA: Diagnosis not present

## 2022-07-13 DIAGNOSIS — I25810 Atherosclerosis of coronary artery bypass graft(s) without angina pectoris: Secondary | ICD-10-CM | POA: Diagnosis not present

## 2022-07-13 DIAGNOSIS — E782 Mixed hyperlipidemia: Secondary | ICD-10-CM | POA: Diagnosis not present

## 2022-07-13 DIAGNOSIS — I10 Essential (primary) hypertension: Secondary | ICD-10-CM | POA: Diagnosis not present

## 2022-07-13 DIAGNOSIS — D6869 Other thrombophilia: Secondary | ICD-10-CM | POA: Diagnosis not present

## 2022-07-13 DIAGNOSIS — Z7901 Long term (current) use of anticoagulants: Secondary | ICD-10-CM | POA: Diagnosis not present

## 2022-07-13 DIAGNOSIS — I4891 Unspecified atrial fibrillation: Secondary | ICD-10-CM | POA: Diagnosis not present

## 2022-07-13 DIAGNOSIS — E1169 Type 2 diabetes mellitus with other specified complication: Secondary | ICD-10-CM | POA: Diagnosis not present

## 2022-07-13 DIAGNOSIS — F419 Anxiety disorder, unspecified: Secondary | ICD-10-CM | POA: Diagnosis not present

## 2022-07-19 IMAGING — CR DG CHEST 2V
2 series · 2 of 2 positions shown · non-contrast
Comparison: 07/03/2014

CLINICAL DATA: Cough

EXAM:
CHEST - 2 VIEW

[w chest pa]
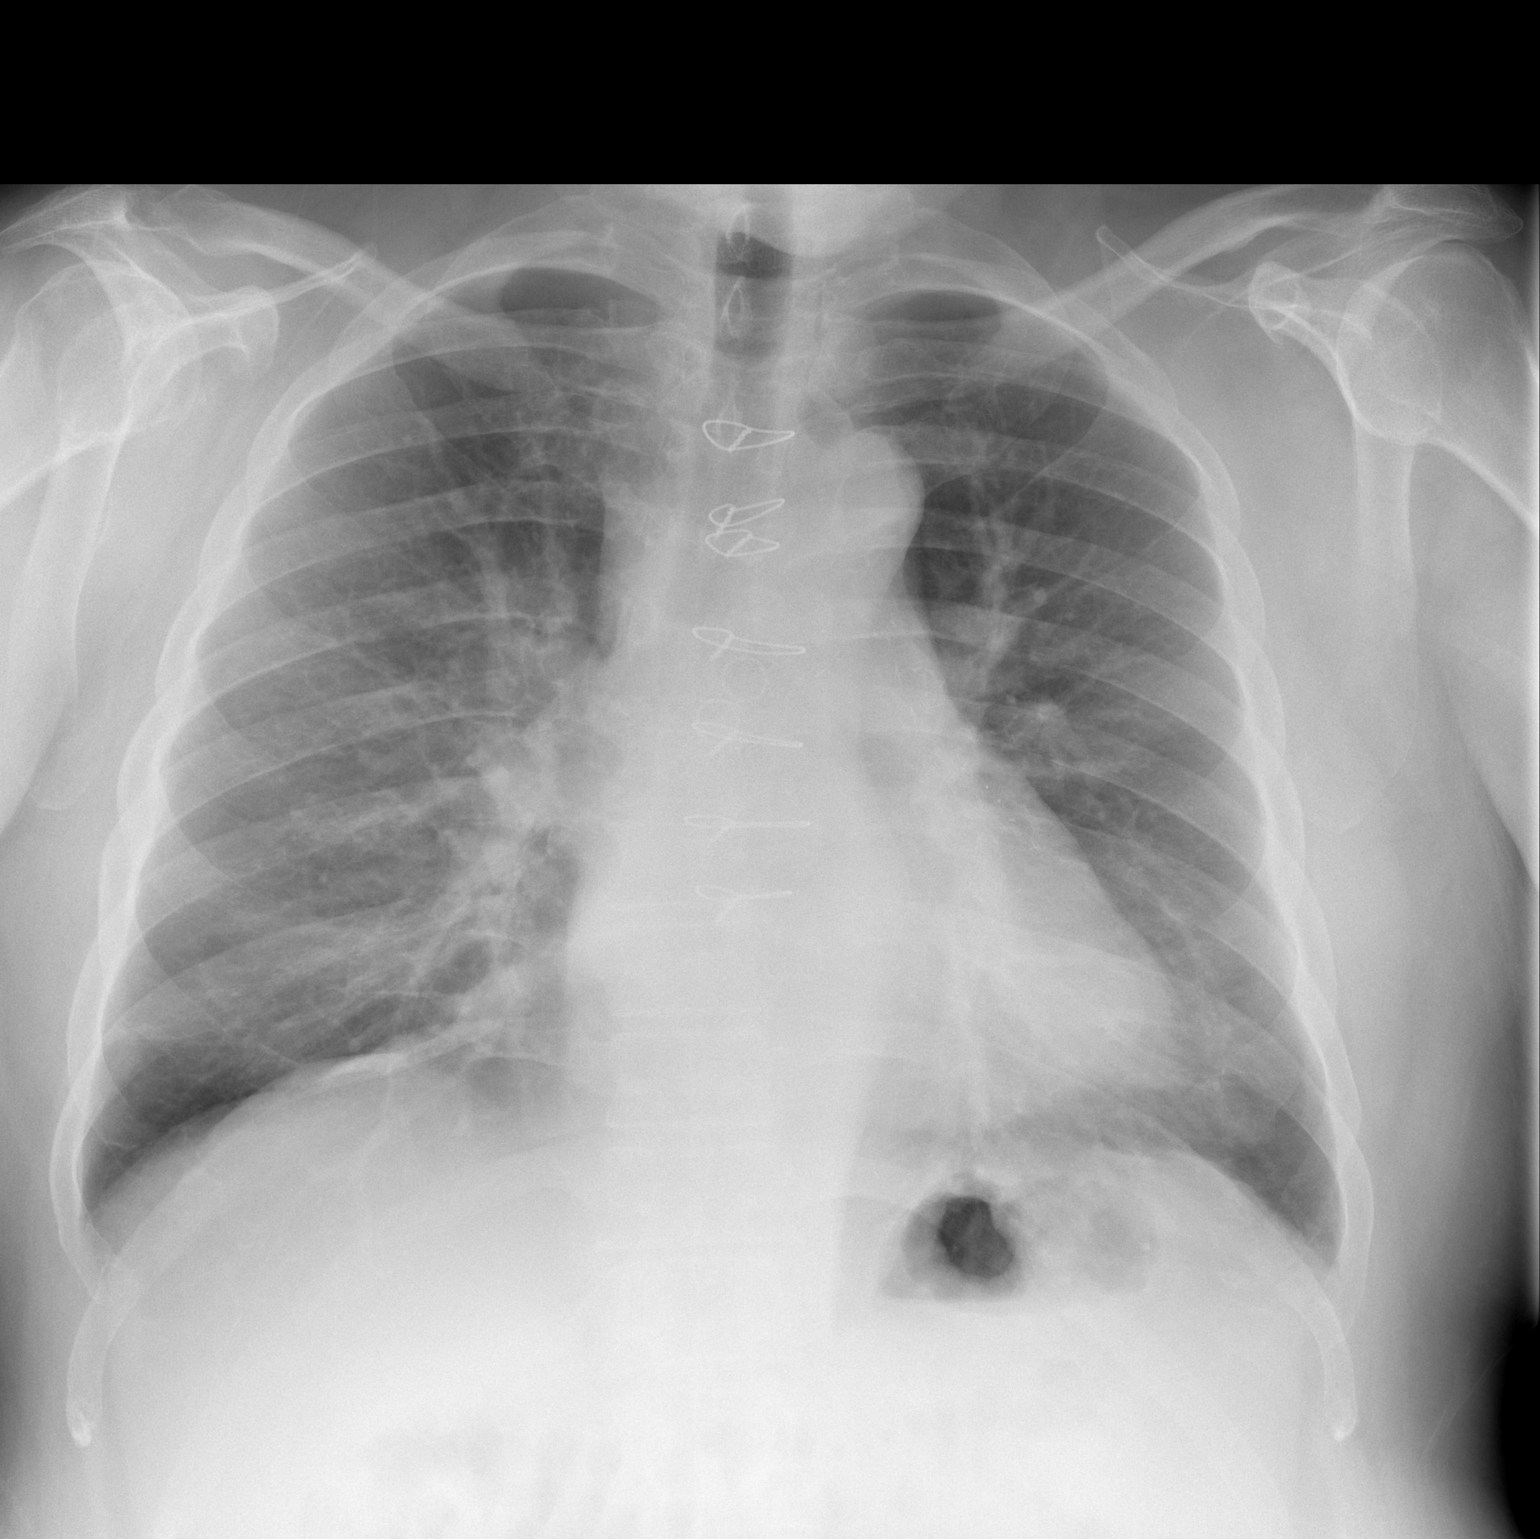

[w chest lat]
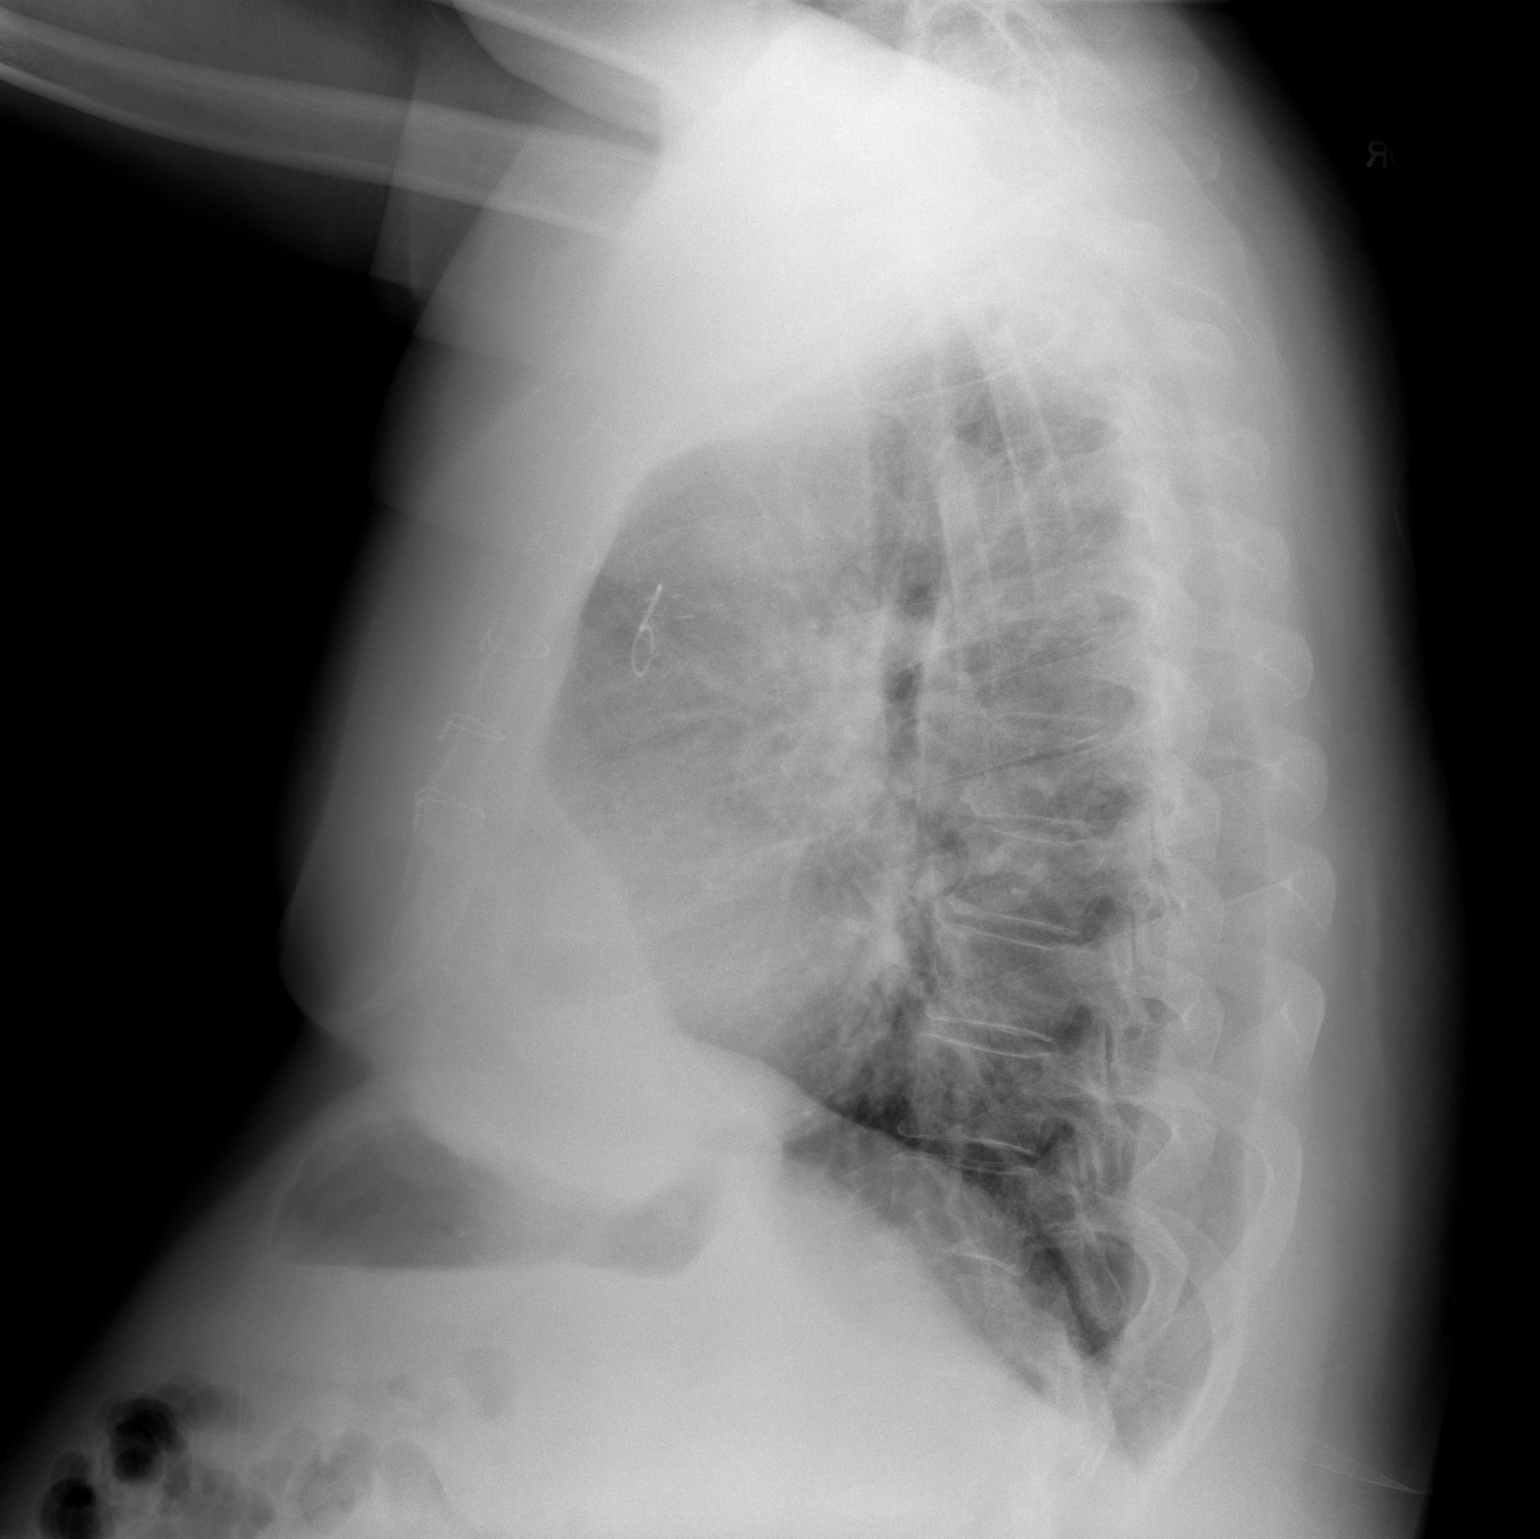

[2 of 2 positions shown; findings below may reference images not displayed]

FINDINGS: The heart size and mediastinal contours are within normal limits.
Both lungs are clear. The visualized skeletal structures are
unremarkable.
IMPRESSION: No active cardiopulmonary disease.

## 2022-07-21 DIAGNOSIS — I4891 Unspecified atrial fibrillation: Secondary | ICD-10-CM | POA: Diagnosis not present

## 2022-07-21 DIAGNOSIS — Z7901 Long term (current) use of anticoagulants: Secondary | ICD-10-CM | POA: Diagnosis not present

## 2022-08-04 DIAGNOSIS — E1151 Type 2 diabetes mellitus with diabetic peripheral angiopathy without gangrene: Secondary | ICD-10-CM | POA: Diagnosis not present

## 2022-08-04 DIAGNOSIS — B351 Tinea unguium: Secondary | ICD-10-CM | POA: Diagnosis not present

## 2022-08-04 DIAGNOSIS — Z7901 Long term (current) use of anticoagulants: Secondary | ICD-10-CM | POA: Diagnosis not present

## 2022-08-04 DIAGNOSIS — I4891 Unspecified atrial fibrillation: Secondary | ICD-10-CM | POA: Diagnosis not present

## 2022-08-13 ENCOUNTER — Other Ambulatory Visit: Payer: Self-pay | Admitting: Cardiovascular Disease

## 2022-08-22 ENCOUNTER — Other Ambulatory Visit: Payer: Self-pay | Admitting: Cardiovascular Disease

## 2022-08-29 ENCOUNTER — Encounter: Payer: Self-pay | Admitting: Cardiovascular Disease

## 2022-08-29 ENCOUNTER — Ambulatory Visit: Payer: Medicare Other | Attending: Cardiovascular Disease | Admitting: Cardiovascular Disease

## 2022-08-29 VITALS — BP 106/78 | HR 94 | Wt 265.6 lb

## 2022-08-29 DIAGNOSIS — I2510 Atherosclerotic heart disease of native coronary artery without angina pectoris: Secondary | ICD-10-CM | POA: Insufficient documentation

## 2022-08-29 DIAGNOSIS — Z951 Presence of aortocoronary bypass graft: Secondary | ICD-10-CM | POA: Insufficient documentation

## 2022-08-29 DIAGNOSIS — Z7901 Long term (current) use of anticoagulants: Secondary | ICD-10-CM | POA: Insufficient documentation

## 2022-08-29 DIAGNOSIS — I480 Paroxysmal atrial fibrillation: Secondary | ICD-10-CM | POA: Diagnosis not present

## 2022-08-29 DIAGNOSIS — G4733 Obstructive sleep apnea (adult) (pediatric): Secondary | ICD-10-CM | POA: Diagnosis not present

## 2022-08-29 DIAGNOSIS — D6862 Lupus anticoagulant syndrome: Secondary | ICD-10-CM | POA: Diagnosis not present

## 2022-08-29 NOTE — Patient Instructions (Signed)
Medication Instructions:   No changes *If you need a refill on your cardiac medications before your next appointment, please call your pharmacy*   Lab Work: Not needed    Testing/Procedures: Not needed   Follow-Up: At [facility], you and your health needs are our priority.  As part of our continuing mission to provide you with exceptional heart care, we have created designated Provider Care Teams.  These Care Teams include your primary Cardiologist (physician) and Advanced Practice Providers (APPs -  Physician Assistants and Nurse Practitioners) who all work together to provide you with the care you need, when you need it.     Your next appointment:   12 month(s) sleep clinic   The format for your next appointment:   In Person  Provider:   DR Claiborne Billings    Other Instructions   Keep your appointment with Dr Gwenlyn Found

## 2022-08-29 NOTE — Progress Notes (Unsigned)
Cardiology Office Note    Date:  09/01/2022   ID:  Eduardo Butler, Eduardo Butler 11/04/1951, MRN [id number]  PCP:  Orpah Melter, MD  Cardiologist:  Shelva Majestic, MD(sleep); Dr. Gwenlyn Found  New sleep consult referred by Dr. Gwenlyn Found    History of Present Illness:  Eduardo Butler is a 71 y.o. male who is followed by Dr. Gwenlyn Found for cardiology care.  He has a history of known CAD and underwent CABG revascularization surgery in March 2003.  He also has a history of recurrent pulmonary emboli thought to be secondary to lupus anticoagulant for which he has been on anticoagulant therapy.  He has been documented to have episodic palpitations with PACs PVCs burst of SVT as well as PAF.  He has been evaluated by Dr. Curt Bears for potential consideration for AF ablation versus dofetilide.  It was decided to pursue weight reduction and evaluation for sleep apnea initially.    Due to concerns for obstructive sleep apnea, he was referred for a diagnostic polysomnogram which was done on March 25, 2022.  He was found to have mild overall sleep apnea with an AHI of 5.0.  However he had moderate sleep apnea with an AHI of 21.2 during REM sleep.  He did not achieve any supine sleep.  He had significant oxygen desaturation to a nadir of 77%.  He subsequently was referred for a CPAP titration study which was done on May 09, 2022.  CPAP was initiated at 7 cm and was titrated to 15 cm of water.  There was mild oxygen desaturation to a nadir of 89%.  It was recommended that he initiate CPAP auto therapy with EPR of 3 at a range of 13 to 18 cm of water.    He received a new ResMed AirSense 11 CPAP AutoSet unit with set up date on June 08, 2022.  Initial download through August 10 shows excellent compliance with usage at 29 of 30 days and an average use at 6 hours and 15 minutes.  AHI was excellent at 0.3 and his 95th percentile pressure was 14.4 with maximum average pressure 15.1.  A subsequent download from August 29 through August 24, 2022  continues to show excellent compliance with average use at 6 hours and 19 minutes.  AHI 0.2 and his 95th percentile pressure is 14.5 with maximum average pressure 15.3.  Presently he goes to bed between 1130 and midnight and typically wakes up around 6 AM.  Since initiating CPAP therapy, his sleep is significantly improved.  He is unaware of any significant snoring.  His sleep is restorative.  An Epworth Sleepiness Scale score was calculated today in the office and this endorsed at 10 suggestive of mild residual daytime sleepiness which I suspect may be contributed by his suboptimal sleep duration.  He denies any restless legs, bruxism, hypnopompic or hypnagogic neck hallucinations or cataplectic events.  He presents for his initial sleep consultation.   Past Medical History:  Diagnosis Date   Anxiety    Arthritis    CHF (congestive heart failure) ([place])    Coronary artery disease    Diabetes ([place])    H/O cardiac catheterization 08/25/2006   normal cath-patent grafts   H/O Doppler ultrasound 02/21/2011   lower ext.venous doppler,, no treatment except support stockings are recommended if clinically indicated   H/O echocardiogram 12/02/2010   EF 55% , no significant abnormalities   History of stress test 01/04/2013   Lexiscan Myoview ; scattered PVC's ; LV EF 54% ;  Normal stress test    Hyperlipidemia    Hypertension    Obesity    Peroneal DVT (deep venous thrombosis) (HCC)    Shortness of breath     Past Surgical History:  Procedure Laterality Date   BYPASS GRAFT  2003   4 bypass    CARDIAC CATHETERIZATION  February 21, 2002   S/P sudden cardiac death; three vessel coronary artery disease; preserved overall left ventricular systolic function with loculated mild to moderate anterolateral hypokinesis; no mitral regurgitation noted   CORONARY ARTERY BYPASS GRAFT  2003   HERNIA REPAIR     LEFT HEART CATH AND CORS/GRAFTS ANGIOGRAPHY N/A 04/23/2020   Procedure: LEFT HEART CATH AND CORS/GRAFTS  ANGIOGRAPHY;  Surgeon: Lorretta Harp, MD;  Location: [facility];  Service: Cardiovascular;  Laterality: N/A;   LUMBAR LAMINECTOMY/ DECOMPRESSION WITH MET-RX Left 07/08/2014   Procedure: Left Lumbar Four-Five Metrex foraminal microdiskectomy;  Surgeon: Consuella Lose, MD;  Location: [facility];  Service: Neurosurgery;  Laterality: Left;  Left Lumbar Four-Five Metrex foraminal microdiskectomy   PELVIC LAPAROSCOPY  2005   SHOULDER SURGERY     UMBILICAL HERNIA REPAIR  11/17/2011   Procedure: HERNIA REPAIR UMBILICAL ADULT;  Surgeon: Harl Bowie, MD;  Location: [facility];  Service: General;  Laterality: N/A;  umbilical hernia repair with mesh    Current Medications: Outpatient Medications Prior to Visit  Medication Sig Dispense Refill   acetaminophen (TYLENOL) 500 MG tablet Take 1,000-1,500 mg by mouth every 6 (six) hours as needed for moderate pain.     albuterol (VENTOLIN HFA) 108 (90 Base) MCG/ACT inhaler Inhale 2 puffs into the lungs every 6 (six) hours as needed for shortness of breath.      ALPRAZolam (XANAX) 0.5 MG tablet Take 0.5 mg by mouth daily as needed for anxiety or sleep.      ammonium lactate (LAC-HYDRIN) 12 % lotion Apply 1 application. topically 2 (two) times daily.     Ascorbic Acid (VITAMIN C) 1000 MG tablet Take 1,000 mg by mouth daily.     aspirin EC 81 MG tablet Take 81 mg by mouth daily.     carvedilol (COREG) 3.125 MG tablet TAKE 1 TABLET(3.125 MG) BY MOUTH TWICE DAILY 180 tablet 3   carvedilol (COREG) 6.25 MG tablet Take 1 tablet (6.25 mg total) by mouth 2 (two) times daily with a meal. 180 tablet 3   cetirizine (ZYRTEC) 10 MG tablet Take 1 tablet (10 mg total) by mouth once as needed for up to 1 dose for allergies or rhinitis. 5 tablet 0   CINNAMON PO Take 1,000 mg by mouth daily.      Elastic Bandages & Supports (MEDICAL COMPRESSION STOCKINGS) MISC Knee High 20-30 mm compression stockings 2 each 0   ezetimibe (ZETIA) 10 MG tablet  Take 1 tablet (10 mg total) by mouth daily. 100 tablet 3   fluticasone (FLONASE) 50 MCG/ACT nasal spray Place 1 spray into both nostrils daily as needed for allergies or rhinitis.     folic acid (FOLVITE) 1 MG tablet Take 1 mg by mouth daily.     furosemide (LASIX) 80 MG tablet TAKE 1 TABLET(80 MG) BY MOUTH DAILY 90 tablet 2   HYDROcodone-acetaminophen (NORCO/VICODIN) 5-325 MG tablet Take 1 tablet by mouth every 6 (six) hours as needed.     losartan (COZAAR) 25 MG tablet TAKE 1 TABLET(25 MG) BY MOUTH DAILY 90 tablet 3   metFORMIN (GLUCOPHAGE) 1000 MG tablet Take 1,000 mg by mouth 2 (two)  times daily with a meal.     Misc Natural Products (IMMUNE FORMULA PO) Take 1 tablet by mouth daily. Immune: Vitamin C, D and Zinc. Chew 1 tablets daily.     omeprazole (PRILOSEC) 40 MG capsule TAKE ONE CAPSULE BY MOUTH DAILY AS NEEDED FOR HEARTBURN 90 capsule 3   pioglitazone (ACTOS) 45 MG tablet Take 45 mg by mouth daily.      simvastatin (ZOCOR) 40 MG tablet Take 1 tablet (40 mg total) by mouth daily at 6 PM. 30 tablet 11   warfarin (COUMADIN) 4 MG tablet Take 4 mg by mouth See admin instructions. Take Sunday and thursday     warfarin (COUMADIN) 5 MG tablet Take 5 mg by mouth See admin instructions. Take on Monday, Tuesday, Wednesday, Friday and Saturday     traZODone (DESYREL) 100 MG tablet Take 100 mg by mouth at bedtime.  (Patient not taking: Reported on 03/15/2022)     Facility-Administered Medications Prior to Visit  Medication Dose Route Frequency Provider Last Rate Last Admin   sodium chloride flush (NS) 0.9 % injection 3 mL  3 mL Intravenous Q12H Lorretta Harp, MD         Allergies:   Morphine and related   Social History   Socioeconomic History   Marital status: Married    Spouse name: Not on file   Number of children: Not on file   Years of education: Not on file   Highest education level: Not on file  Occupational History   Not on file  Tobacco Use   Smoking status: Never    Smokeless tobacco: Never  Substance and Sexual Activity   Alcohol use: No    Comment: none since 1979   Drug use: No   Sexual activity: Not on file  Other Topics Concern   Not on file  Social History Narrative   Not on file   Social Determinants of Health   Financial Resource Strain: Not on file  Food Insecurity: Not on file  Transportation Needs: Not on file  Physical Activity: Not on file  Stress: Not on file  Social Connections: Not on file    Socially he was born in [place].  He is in his second marriage of 68 years.  He has 2 children from his first marriage ages 81 and 74.  He is retired from a Her[employer].  He also has done some ministry work with seniors.  Family History:  The patient's family history includes Diabetes in his brother and mother; Hypertension in his brother.  Mother died at 53 and had diabetes.  Father died at age 40 and had cancer and significant ETOH history.  ROS General: Negative; No fevers, chills, or night sweats;  HEENT: Negative; No changes in vision or hearing, sinus congestion, difficulty swallowing Pulmonary: History of recurrent PE Cardiovascular: See HPI GI: Negative; No nausea, vomiting, diarrhea, or abdominal pain GU: Negative; No dysuria, hematuria, or difficulty voiding Musculoskeletal: Negative; no myalgias, joint pain, or weakness Hematologic/Oncology: Lupus anticoagulant Endocrine: Negative; no heat/cold intolerance; no diabetes Neuro: Negative; no changes in balance, headaches Skin: Negative; No rashes or skin lesions Psychiatric: Negative; No behavioral problems, depression Sleep: See HPI Other comprehensive 14 point system review is negative.   PHYSICAL EXAM:   VS:  BP 106/78   Pulse 94   Wt 265 lb 9.6 oz (120.5 kg)   SpO2 97%   BMI 42.87 kg/m     Repeat blood pressure by me was 100/68  Wt  Readings from Last 3 Encounters:  08/29/22 265 lb 9.6 oz (120.5 kg)  05/09/22 260 lb (117.9 kg)  03/26/22 260 lb (117.9  kg)    General: Alert, oriented, no distress.  Skin: normal turgor, no rashes, warm and dry HEENT: Normocephalic, atraumatic. Pupils equal round and reactive to light; sclera anicteric; extraocular muscles intact;  Nose without nasal septal hypertrophy Mouth/Parynx benign; Mallinpatti scale 3 Neck: No JVD, no carotid bruits; normal carotid upstroke Lungs: clear to ausculatation and percussion; no wheezing or rales Chest wall: without tenderness to palpitation Heart: PMI not displaced, RRR, s1 s2 normal, 1/6 systolic murmur, no diastolic murmur, no rubs, gallops, thrills, or heaves Abdomen: soft, nontender; no hepatosplenomehaly, BS+; abdominal aorta nontender and not dilated by palpation. Back: no CVA tenderness Pulses 2+ Musculoskeletal: full range of motion, normal strength, no joint deformities Extremities: Wearing compression stockings; mild left greater than right leg swelling no clubbing cyanosis or edema, Homan's sign negative  Neurologic: grossly nonfocal; Cranial nerves grossly wnl Psychologic: Normal mood and affect   Studies/Labs Reviewed:   August 29, 2022 ECG (independently read by me): Atrial fibrillation at 94, low voltage; QTc 470 msec  Recent Labs:    Latest Ref Rng & Units 04/15/2020    4:41 PM 03/27/2020   11:56 AM 08/28/2019    8:55 AM  BMP  Glucose 65 - 99 mg/dL 128  115  147   BUN 8 - 27 mg/dL _0 Creatinine 0.76 - 1.27 mg/dL 0.90  0.89  0.96   BUN/Creat Ratio 10 - _1 Sodium 134 - 144 mmol/L 139  143  140   Potassium 3.5 - 5.2 mmol/L 4.1  5.0  4.3   Chloride 96 - 106 mmol/L 99  100  100   CO2 20 - 29 mmol/L _2 Calcium 8.6 - 10.2 mg/dL 8.9  9.4  8.9         Latest Ref Rng & Units 10/21/2020   11:52 AM 08/28/2019    8:55 AM 06/12/2018    4:28 AM  Hepatic Function  Total Protein 6.0 - 8.5 g/dL 6.3  6.6  7.4   Albumin 3.8 - 4.8 g/dL 4.0  4.3  4.1   AST 0 - 40 IU/L _3 ALT 0 - 44 IU/L _4 Alk  Phosphatase 44 - 121 IU/L 55  56  46   Total Bilirubin 0.0 - 1.2 mg/dL 0.4  0.5  0.9   Bilirubin, Direct 0.00 - 0.40 mg/dL 0.16          Latest Ref Rng & Units 04/15/2020    4:41 PM 03/27/2020   11:56 AM 06/12/2018    4:28 AM  CBC  WBC 3.4 - 10.8 x10E3/uL 8.5  6.2  11.2   Hemoglobin 13.0 - 17.7 g/dL 11.7  12.1  12.5   Hematocrit 37.5 - 51.0 % 36.1  37.6  38.6   Platelets 150 - 450 x10E3/uL 278  236  263    Lab Results  Component Value Date   MCV 86 04/15/2020   MCV 88 03/27/2020   MCV 86.7 06/12/2018   Lab Results  Component Value Date   TSH 1.454 Test methodology is 3rd generation TSH 06/26/2007   No results found for: "HGBA1C"   BNP    Component Value Date/Time   BNP 58.6 04/08/2019 1536  BNP 42.6 04/12/2016 1124    ProBNP No results found for: "PROBNP"   Lipid Panel     Component Value Date/Time   CHOL 117 10/21/2020 1152   TRIG 67 10/21/2020 1152   HDL 44 10/21/2020 1152   CHOLHDL 2.7 10/21/2020 1152   CHOLHDL 3.6 06/26/2007 1510   VLDL 25 06/26/2007 1510   LDLCALC 59 10/21/2020 1152   LABVLDL 14 10/21/2020 1152     RADIOLOGY: No results found.   Additional studies/ records that were reviewed today include:     Patient Name: Kurk, Corniel Date: 05/09/2022 Gender: Male D.O.B: [birth year] Age (years): 52 Referring Provider: Lorretta Harp Height (inches): 66 Interpreting Physician: Shelva Majestic MD, ABSM Weight (lbs): 260 RPSGT: Gwenyth Allegra BMI: 42 MRN: [id number] Neck Size: 19.00   CLINICAL INFORMATION The patient is referred for a CPAP titration to treat sleep apnea.   Date of NPSG:  03/25/2022:  AHI 5.0/h; RDI 5.0/h; REM AHI 21.2/h: O2 nadir 77%.   SLEEP STUDY TECHNIQUE As per the AASM Manual for the Scoring of Sleep and Associated Events v2.3 (April 2016) with a hypopnea requiring 4% desaturations.   The channels recorded and monitored were frontal, central and occipital EEG, electrooculogram (EOG), submentalis EMG (chin),  nasal and oral airflow, thoracic and abdominal wall motion, anterior tibialis EMG, snore microphone, electrocardiogram, and pulse oximetry. Continuous positive airway pressure (CPAP) was initiated at the beginning of the study and titrated to treat sleep-disordered breathing.   MEDICATIONS acetaminophen (TYLENOL) 500 MG tablet albuterol (VENTOLIN HFA) 108 (90 Base) MCG/ACT inhaler ALPRAZolam (XANAX) 0.5 MG tablet ammonium lactate (LAC-HYDRIN) 12 % lotion Ascorbic Acid (VITAMIN C) 1000 MG tablet aspirin EC 81 MG tablet carvedilol (COREG) 6.25 MG tablet cetirizine (ZYRTEC) 10 MG tablet CINNAMON PO Elastic Bandages & Supports (MEDICAL COMPRESSION STOCKINGS) MISC ezetimibe (ZETIA) 10 MG tablet fluticasone (FLONASE) 50 MCG/ACT nasal spray folic acid (FOLVITE) 1 MG tablet furosemide (LASIX) 80 MG tablet HYDROcodone-acetaminophen (NORCO/VICODIN) 5-325 MG tablet losartan (COZAAR) 25 MG tablet metFORMIN (GLUCOPHAGE) 1000 MG tablet Misc Natural Products (IMMUNE FORMULA PO) omeprazole (PRILOSEC) 40 MG capsule pioglitazone (ACTOS) 45 MG tablet simvastatin (ZOCOR) 40 MG tablet traZODone (DESYREL) 100 MG tablet warfarin (COUMADIN) 4 MG tablet warfarin (COUMADIN) 5 MG tablet  Medications self-administered by patient taken the night of the study : ZOLPIDEM TARTRATE, ALPRAZOLAM   TECHNICIAN COMMENTS Comments added by technician: Patient was very anxious and slept on a bed wedge and 4 pillows. Patient may need RAMP feature on his machine due to anxiety. Comments added by scorer: N/A   RESPIRATORY PARAMETERS Optimal PAP Pressure (cm):  15        AHI at Optimal Pressure (/hr):            0 Overall Minimal O2 (%):         89.0     Supine % at Optimal Pressure (%):    0 Minimal O2 at Optimal Pressure (%): 94.0        SLEEP ARCHITECTURE The study was initiated at 10:11:18 PM and ended at 4:36:08 AM.   Sleep onset time was 71.8 minutes and the sleep efficiency was 66.7%%. The total sleep time was  256.5 minutes.   The patient spent 6.2%% of the night in stage N1 sleep, 76.8%% in stage N2 sleep, 0.0%% in stage N3 and 17% in REM.Stage REM latency was 58.0 minutes   Wake after sleep onset was 56.6. Alpha intrusion was absent. Supine sleep was 0.00%.   CARDIAC DATA The 2  lead EKG demonstrated sinus rhythm. The mean heart rate was 54.4 beats per minute. Other EKG findings include: None.   LEG MOVEMENT DATA The total Periodic Limb Movements of Sleep (PLMS) were 0. The PLMS index was 0.0. A PLMS index of <15 is considered normal in adults.   IMPRESSIONS - CPAP was initated at 7 cm and was titrated to 15 cm of water. ( AHI 0 , O2 nadir 94%) - Mild oxygen desaturations to a nadir of 89.0%. - Intermittent snoring which resolved at 13 cm. - Mild cardiac abnormalities were observed during this study: PACs - Clinically significant periodic limb movements were not noted during this study. Arousals associated with PLMs were rare.   DIAGNOSIS - Obstructive Sleep Apnea (G47.33)   RECOMMENDATIONS - Recommend an initial trial of CPAP Auto therapy with EPR of 3 at 13 - 18  cm H2O with heated humidification.  A Medium size Fisher[employer]Paykel Full Face Simplus mask was used for the titration. - Effort should be made to optimize nasal and oropharyngeal patency. - Avoid alcohol, sedatives and other CNS depressants that may worsen sleep apnea and disrupt normal sleep architecture. - Sleep hygiene should be reviewed to assess factors that may improve sleep quality. - Weight management (BMI 42) and regular exercise should be initiated or continued. - Recommend a download and sleep clinic evaluation after 4 weeks of therapy.    ASSESSMENT:    1. Obstructive sleep apnea   2. Paroxysmal atrial fibrillation (HCC)   3. CAD in native artery   4. Hx of CABG   5. Morbid obesity ([place])   6. Anticoagulated   7. Lupus anticoagulant disorder [facility])     PLAN:   Mr. Derrick Tiegs is a 71 year old gentleman who has  known CAD and is status post CABG revascularization, has a history of recurrent pulmonary emboli thought secondary to lupus anticoagulant for which she is on lifelong warfarin anticoagulation.  He is followed by Dr. Alvester Chou and has had paroxysmal atrial fibrillation as well as additional arrhythmias and has been seen by Dr. Curt Bears who has discussed atrial fibrillation ablation versus dofetilide but initial recommendation was weight reduction and evaluation for sleep apnea.  He has been found to have mild overall sleep apnea but sleep apnea was moderate during REM sleep with an AHI of 21.2/h and he had significant oxygen desaturation to a nadir of 77%.  On his initial evaluation he did not have supine sleep.  I reviewed his diagnostic polysomnogram and thoroughly reviewed his CPAP titration study.  Set on CPAP after receiving a ResMed air sense 11 AutoSet unit currently set at 13 to 18 cm.  His CPAP set up date was June 08, 2022 and subsequent download to verify compliance.  His AHI has been excellent at 0.3 and 0.2, respectively over the past several months.  Sleep duration, however is suboptimal at 6 hours and 15 minutes.  I discussed with him optimal sleep duration at at least 7 to 8 hours.  I had a lengthy discussion with him today regarding normal sleep architecture and potential disruptive effects of untreated sleep apnea.  In particular I discussed potential adverse cardiovascular consequences if sleep apnea is untreated and its effect on blood pressure, potential for nocturnal arrhythmias, significant increased risk for atrial fibrillation development, and if atrial fibrillation is converted back to sinus rhythm the significant increase in recurrent AF if left untreated.  In addition I discussed its negative effect on insulin resistance resulting in elevated glucose, increased inflammatory markers,  as well as significant increase in nocturnal GERD.  He has coronary obstructive disease and is status post CABG  and I discussed in particular the potential for nocturnal hypoxemia contributing to both potential nocturnal cardiovascular as well as cerebrovascular ischemia.  In addition I discussed pathophysiology associated with increased nocturia if sleep apnea is present and potential benefit of CPAP therapy.  After much discussion he feels that he has a much better understanding of the importance of sleep apnea regarding his cardiovascular health.  I discussed the importance of weight loss with his morbid obesity currently at 42.87.  Of note, he tells me that when he graduated from high school he weighed 112 pounds.  Today he weighs 265 pounds.  He will follow-up with Drs. Berry and [employer].  I will see him in 1 year for reevaluation or sooner as needed.   Medication Adjustments/Labs and Tests Ordered: Current medicines are reviewed at length with the patient today.  Concerns regarding medicines are outlined above.  Medication changes, Labs and Tests ordered today are listed in the Patient Instructions below. Patient Instructions  Medication Instructions:   No changes *If you need a refill on your cardiac medications before your next appointment, please call your pharmacy*   Lab Work: Not needed    Testing/Procedures: Not needed   Follow-Up: At [facility], you and your health needs are our priority.  As part of our continuing mission to provide you with exceptional heart care, we have created designated Provider Care Teams.  These Care Teams include your primary Cardiologist (physician) and Advanced Practice Providers (APPs -  Physician Assistants and Nurse Practitioners) who all work together to provide you with the care you need, when you need it.     Your next appointment:   12 month(s) sleep clinic   The format for your next appointment:   In Person  Provider:   DR Claiborne Billings    Other Instructions   Keep your appointment with Dr Gwenlyn Found    Signed, Shelva Majestic, MD, [facility],  [place], American Board of Sleep Medicine  09/01/2022 2:[address] [place] [address], [place], [place], [place]  [address] Phone: [phone]

## 2022-09-01 ENCOUNTER — Encounter: Payer: Self-pay | Admitting: Cardiovascular Disease

## 2022-09-01 DIAGNOSIS — I4891 Unspecified atrial fibrillation: Secondary | ICD-10-CM | POA: Diagnosis not present

## 2022-09-01 DIAGNOSIS — Z7901 Long term (current) use of anticoagulants: Secondary | ICD-10-CM | POA: Diagnosis not present

## 2022-09-02 ENCOUNTER — Telehealth: Payer: Self-pay | Admitting: Cardiovascular Disease

## 2022-09-02 NOTE — Telephone Encounter (Signed)
Patient stated he is concerned about his afib: "It really bothers me." He stated that last week he had sob and dizziness "a little bit." He denies chest pain. He had appointment with Dr. Claiborne Billings on Monday and has been on CPAP for 2 months. Moved appointment with Dr. Gwenlyn Found from 11/14 to 10/31.

## 2022-09-02 NOTE — Telephone Encounter (Signed)
Pt would like Dr. Gwenlyn Found to know that Dr. Claiborne Billings suggested they get his heart back in rhythm and would like a call back to discuss how to do that. Requesting call back.

## 2022-09-20 DIAGNOSIS — Z7901 Long term (current) use of anticoagulants: Secondary | ICD-10-CM | POA: Diagnosis not present

## 2022-09-20 DIAGNOSIS — I4891 Unspecified atrial fibrillation: Secondary | ICD-10-CM | POA: Diagnosis not present

## 2022-09-27 ENCOUNTER — Encounter: Payer: Self-pay | Admitting: Cardiovascular Disease

## 2022-09-27 ENCOUNTER — Ambulatory Visit: Payer: Medicare Other | Attending: Cardiovascular Disease | Admitting: Cardiovascular Disease

## 2022-09-27 VITALS — BP 94/58 | HR 79 | Ht 66.0 in | Wt 257.0 lb

## 2022-09-27 DIAGNOSIS — I25810 Atherosclerosis of coronary artery bypass graft(s) without angina pectoris: Secondary | ICD-10-CM | POA: Diagnosis not present

## 2022-09-27 DIAGNOSIS — E782 Mixed hyperlipidemia: Secondary | ICD-10-CM | POA: Insufficient documentation

## 2022-09-27 DIAGNOSIS — R600 Localized edema: Secondary | ICD-10-CM | POA: Insufficient documentation

## 2022-09-27 DIAGNOSIS — I10 Essential (primary) hypertension: Secondary | ICD-10-CM | POA: Diagnosis not present

## 2022-09-27 DIAGNOSIS — G4733 Obstructive sleep apnea (adult) (pediatric): Secondary | ICD-10-CM | POA: Diagnosis not present

## 2022-09-27 DIAGNOSIS — I480 Paroxysmal atrial fibrillation: Secondary | ICD-10-CM | POA: Diagnosis not present

## 2022-09-27 DIAGNOSIS — I2510 Atherosclerotic heart disease of native coronary artery without angina pectoris: Secondary | ICD-10-CM

## 2022-09-27 NOTE — Patient Instructions (Addendum)
Medication Instructions:  Your physician recommends that you continue on your current medications as directed. Please refer to the Current Medication list given to you today.  *If you need a refill on your cardiac medications before your next appointment, please call your pharmacy*    Follow-Up: At [facility], you and your health needs are our priority.  As part of our continuing mission to provide you with exceptional heart care, we have created designated Provider Care Teams.  These Care Teams include your primary Cardiologist (physician) and Advanced Practice Providers (APPs -  Physician Assistants and Nurse Practitioners) who all work together to provide you with the care you need, when you need it.  We recommend signing up for the patient portal called "MyChart".  Sign up information is provided on this After Visit Summary.  MyChart is used to connect with patients for Virtual Visits (Telemedicine).  Patients are able to view lab/test results, encounter notes, upcoming appointments, etc.  Non-urgent messages can be sent to your provider as well.   To learn more about what you can do with MyChart, go to [email or web address].    Your next appointment:   6 month(s)  The format for your next appointment:   In Person  Provider:   Fabian Sharp, PA-C, Sande Rives, PA-C, Jory Sims, DNP, ANP, Almyra Deforest, PA-C, or Diona Browner, NP       Then, Quay Burow, MD will plan to see you again in 12 month(s).    Other Instructions We will have the EP scheduler reach out to you to make an office visit to see Dr. Curt Bears.

## 2022-09-27 NOTE — Assessment & Plan Note (Signed)
History of pulmonary embolism in the past on warfarin anticoagulation for lupus anticoagulant.

## 2022-09-27 NOTE — Assessment & Plan Note (Signed)
History of hyperlipidemia on Zetia and statin therapy with lipid profile performed 11/24/2021 revealing total cholesterol 128, LDL 59 and HDL of 47.

## 2022-09-27 NOTE — Assessment & Plan Note (Signed)
On furosemide 80 mg a day.  He has trace edema on exam today.

## 2022-09-27 NOTE — Assessment & Plan Note (Signed)
History of essential hypertension a blood pressure measured today at 94/58.  He is on low-dose carvedilol and losartan.

## 2022-09-27 NOTE — Progress Notes (Signed)
09/27/2022 Eduardo Butler   01-25-1951  [id number]  Primary Physician Orpah Melter, MD Primary Cardiologist: Lorretta Harp MD FACP, [place], [place], [place]  HPI:  Eduardo Butler is a 71 y.o.  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 [name] follows. His other problems include hyperlipidemia and erectile dysfunction. I catheterized him at the [facility] September 28th , 2007 revealing patent grafts with normal LV function .  This suggested that he had a false positive Myoview stress test and medical therapy was recommended.  His last Myoview performed 01/04/13 was nonischemic Dr. Doyle Askew follows his lipid profile. When I saw him 2 months ago he was complaining of dyspnea on exertion. He saw one of our PAs a month later with increasing dyspnea on exertion. His diastolic was cut in half but remains bradycardic although he was clinically improved. His when necessary hydrochlorothiazide was changed to when necessary Lasix which she took over the weekend for several doses which resulted in improved dyspnea and decreased edema. I did discuss with him today so restriction. A 2-D echo was ordered that showed normal FUNCTION in a Myoview stress test that showed apical thinning with new ischemia in the inferior wall compared to a previous Myoview 2 years ago. Since I saw him 3 months ago he continued to do well.  He says he is stronger.  I did adjust his diuretics which improved his lower extremity edema.  He also admits to dietary indiscretion.  He denies  chest pain does have chronic dyspnea on exertion.   He has seen Almyra Deforest PA-C in the office 03/29/2020...  His major complaint is of dyspnea.  He has not lost any weight.  He denies chest pain.  An echo performed 03/07/2018 was completely normal.  He has been more dyspneic and weaker since I saw him back in December.  A Myoview stress test ordered by Almyra Deforest 04/08/2020 showed subtle anterior ischemia only mildly different from the last Myoview performed 7/17.  However, given the fact that his last cath was 14 years ago I decided to perform outpatient cardiac catheterization on 04/23/2020 revealing patent grafts.  2D echo revealed normal LV function.   He  was seen in the ER 09/13/2020 with dyspnea with negative work-up.  Chest x-ray showed no active disease.  He saw Dr. Doyle Askew in the office on 10/16/2020 and an EKG on that date did show atrial fibrillation.  He remains on warfarin anticoagulation.  His hemoglobin was apparently in the mid 12 range.   He had an event monitor that showed predominantly sinus rhythm with PACs, PVCs, short runs of SVT and not as VT as well as runs of PAF with slow ventricular response.  He is on warfarin oral anticoagulation.  Since I saw him in the office 6 months ago he continues to do well.  I did refer him to Dr. Curt Bears for evaluation of A-fib who discussed A-fib ablation versus dofetilide.  They decided to pursue weight reduction and evaluation for sleep apnea to begin with.  He feels much better in sinus rhythm.  He is on warfarin and maintains INR is in therapeutic window.  I did refer him to Dr. Claiborne Billings and he is currently using CPAP and feels somewhat clinically improved.  He is in A-fib today.  He does complain of persistent dyspnea.  He has been unable to lose weight on his own.     Current Meds  Medication Sig   acetaminophen (TYLENOL) 500 MG tablet Take 1,000-1,500 mg by mouth every 6 (six) hours as needed for moderate pain.   albuterol (VENTOLIN HFA) 108 (90 Base)  MCG/ACT inhaler Inhale 2 puffs into the lungs every 6 (six) hours as needed for shortness of breath.    ALPRAZolam (XANAX) 0.5 MG tablet Take 0.5 mg by mouth daily as needed for anxiety or sleep.    ammonium lactate (LAC-HYDRIN) 12 % lotion Apply 1 application. topically 2 (two) times daily.   Ascorbic Acid (VITAMIN C) 1000 MG tablet Take 1,000 mg by mouth daily.   aspirin EC 81 MG tablet Take 81 mg by mouth daily.   carvedilol (COREG) 3.125 MG tablet TAKE 1 TABLET(3.125 MG) BY MOUTH TWICE DAILY   carvedilol (COREG) 6.25 MG tablet Take 1 tablet (6.25 mg total) by mouth 2 (two) times daily with a meal.   cetirizine (ZYRTEC) 10 MG tablet Take 1 tablet (10 mg total) by mouth once as needed for up to 1 dose for allergies or rhinitis.   CINNAMON PO Take 1,000 mg by mouth daily.    Elastic Bandages & Supports (MEDICAL COMPRESSION STOCKINGS) MISC Knee High 20-30 mm compression stockings   ezetimibe (ZETIA) 10 MG tablet Take 1 tablet (10 mg total) by mouth daily.   fluticasone (FLONASE) 50 MCG/ACT nasal spray Place 1 spray into both nostrils daily as needed for allergies or rhinitis.   folic acid (FOLVITE) 1 MG tablet Take 1 mg by mouth daily.   furosemide (LASIX) 80 MG tablet TAKE 1 TABLET(80 MG) BY MOUTH DAILY   HYDROcodone-acetaminophen (NORCO/VICODIN) 5-325 MG tablet Take 1 tablet by mouth every 6 (six) hours as needed.   losartan (COZAAR) 25 MG tablet TAKE 1 TABLET(25 MG) BY MOUTH DAILY   metFORMIN (GLUCOPHAGE) 1000 MG tablet Take 1,000 mg by mouth 2 (two) times daily with a meal.   Misc Natural Products (IMMUNE FORMULA PO) Take 1 tablet by mouth daily. Immune: Vitamin C, D and Zinc. Chew 1 tablets daily.   omeprazole (PRILOSEC) 40 MG capsule TAKE ONE CAPSULE BY MOUTH DAILY AS NEEDED FOR HEARTBURN   pioglitazone (ACTOS) 45 MG tablet Take 45 mg by mouth daily.    simvastatin (ZOCOR) 40 MG tablet Take 1 tablet (40 mg total) by mouth daily at 6 PM.   warfarin (COUMADIN) 4 MG tablet Take 4 mg by mouth  See admin instructions. Take Sunday and thursday   warfarin (COUMADIN) 5 MG tablet Take 5 mg by mouth See admin instructions. Take on Monday, Tuesday, Wednesday, Friday and Saturday     Allergies  Allergen Reactions   Morphine And Related     hallucinations Other reaction(s): Hallucinations, hallucinations, Other Other reaction(s): Delusions (intolerance), Other hallucinations hallucinations hallucinations hallucinations hallucinations hallucinations     Social History   Socioeconomic History   Marital status: Married    Spouse name: Not on file   Number of children: Not on file   Years of education: Not on file   Highest education level: Not on file  Occupational History   Not on file  Tobacco Use  Smoking status: Never   Smokeless tobacco: Never  Substance and Sexual Activity   Alcohol use: No    Comment: none since 1979   Drug use: No   Sexual activity: Not on file  Other Topics Concern   Not on file  Social History Narrative   Not on file   Social Determinants of Health   Financial Resource Strain: Not on file  Food Insecurity: Not on file  Transportation Needs: Not on file  Physical Activity: Not on file  Stress: Not on file  Social Connections: Not on file  Intimate Partner Violence: Not on file     Review of Systems: General: negative for chills, fever, night sweats or weight changes.  Cardiovascular: negative for chest pain, dyspnea on exertion, edema, orthopnea, palpitations, paroxysmal nocturnal dyspnea or shortness of breath Dermatological: negative for rash Respiratory: negative for cough or wheezing Urologic: negative for hematuria Abdominal: negative for nausea, vomiting, diarrhea, bright red blood per rectum, melena, or hematemesis Neurologic: negative for visual changes, syncope, or dizziness All other systems reviewed and are otherwise negative except as noted above.    Blood pressure (!) 94/58, pulse 79, height '5\' 6"'$  (1.676 m),  weight 257 lb (116.6 kg).  General appearance: alert and no distress Neck: no adenopathy, no carotid bruit, no JVD, supple, symmetrical, trachea midline, and thyroid not enlarged, symmetric, no tenderness/mass/nodules Lungs: clear to auscultation bilaterally Heart: irregularly irregular rhythm Extremities: Trace bilateral lower extremity edema Pulses: 2+ and symmetric Skin: Skin color, texture, turgor normal. No rashes or lesions Neurologic: Grossly normal  EKG atrial fibrillation with a ventricular sponsor 79 and low limb voltage.  I personally reviewed this EKG.  ASSESSMENT AND PLAN:   Hyperlipidemia History of hyperlipidemia on Zetia and statin therapy with lipid profile performed 11/24/2021 revealing total cholesterol 128, LDL 59 and HDL of 47.  PULMONARY EMBOLISM AND INFARCTION History of pulmonary embolism in the past on warfarin anticoagulation for lupus anticoagulant.  Essential hypertension History of essential hypertension a blood pressure measured today at 94/58.  He is on low-dose carvedilol and losartan.  Coronary artery disease History of CAD status post coronary artery bypass grafting X4 March 2003 with a LIMA to his LAD, free radial to the circumflex marginal and sequential vein to the acute marginal and right coronary artery of the distal RCA.  I recatheterized him 04/23/2020 revealing patent grafts.  He has normal LV function as well.  He denies chest pain.  Paroxysmal atrial fibrillation (HCC) History of PAF currently in atrial fibrillation on warfarin oral anticoagulation.  He is did see Dr. Curt Bears in January of this year who suggested weight loss as a first option prior to consideration of either ablation or Tikosyn.  He does complain of significant dyspnea which I suspect is multifactorial.  He has been unable to lose weight.  I am referring him back to Dr. Curt Bears..  Obstructive sleep apnea History of obstructive sleep apnea on CPAP followed by Dr. Claiborne Billings.  He said  that since being on CPAP he feels clinically improved.  Morbid obesity (HCC) BMI of 41.  He is unable to to lose weight.  Been talking about this for several years.  I think this is contributing to his dyspnea fatigue and sleep apnea.  [facility].  I am wondering whether he would be a candidate for one of the newer weight loss drugs.  Bilateral lower extremity edema On furosemide 80 mg a  day.  He has trace edema on exam today.     Lorretta Harp MD FACP,FACC,FAHA, [facility] 09/27/2022 10:48 AM

## 2022-09-27 NOTE — Assessment & Plan Note (Signed)
History of PAF currently in atrial fibrillation on warfarin oral anticoagulation.  He is did see Dr. Curt Bears in January of this year who suggested weight loss as a first option prior to consideration of either ablation or Tikosyn.  He does complain of significant dyspnea which I suspect is multifactorial.  He has been unable to lose weight.  I am referring him back to Dr. Curt Bears.Eduardo Butler

## 2022-09-27 NOTE — Assessment & Plan Note (Signed)
History of CAD status post coronary artery bypass grafting X4 March 2003 with a LIMA to his LAD, free radial to the circumflex marginal and sequential vein to the acute marginal and right coronary artery of the distal RCA.  I recatheterized him 04/23/2020 revealing patent grafts.  He has normal LV function as well.  He denies chest pain.

## 2022-09-27 NOTE — Assessment & Plan Note (Signed)
BMI of 41.  He is unable to to lose weight.  Been talking about this for several years.  I think this is contributing to his dyspnea fatigue and sleep apnea.  I am [facility] for [facility] weight loss.  I am wondering whether he would be a candidate for one of the newer weight loss drugs.

## 2022-09-27 NOTE — Assessment & Plan Note (Signed)
History of obstructive sleep apnea on CPAP followed by Dr. Claiborne Billings.  He said that since being on CPAP he feels clinically improved.

## 2022-09-28 ENCOUNTER — Telehealth: Payer: Self-pay | Admitting: Cardiovascular Disease

## 2022-09-28 NOTE — Telephone Encounter (Signed)
New message  Pt states that he was in the office yesterday and Dr. Gwenlyn Found mentioned a new weight loss medication. He would like to talk to the nurse further about this discussion

## 2022-09-28 NOTE — Telephone Encounter (Signed)
Spoke with the patient who states that Dr. Gwenlyn Found discussed weight loss and medications with him at his visit yesterday. He states that he is very interested in weight loss drugs, however his PCP will not prescribe them. He is wondering if Dr. Gwenlyn Found would prescribe something for him. I advised patient that he was referred to Dr. Leafy Ro yesterday at the wellness center and she will likely be the one to prescribe any drugs for weight loss. I have provided the patient with their number. Patient verbalized understanding.

## 2022-10-11 ENCOUNTER — Encounter: Payer: Self-pay | Admitting: Bariatrics

## 2022-10-11 ENCOUNTER — Ambulatory Visit (INDEPENDENT_AMBULATORY_CARE_PROVIDER_SITE_OTHER): Payer: Medicare Other | Admitting: Bariatrics

## 2022-10-11 ENCOUNTER — Ambulatory Visit: Payer: Medicare Other | Admitting: Cardiovascular Disease

## 2022-10-11 VITALS — BP 117/74 | HR 76 | Temp 97.6°F | Ht 66.0 in | Wt 249.0 lb

## 2022-10-11 DIAGNOSIS — I10 Essential (primary) hypertension: Secondary | ICD-10-CM | POA: Diagnosis not present

## 2022-10-11 DIAGNOSIS — G4733 Obstructive sleep apnea (adult) (pediatric): Secondary | ICD-10-CM | POA: Diagnosis not present

## 2022-10-11 DIAGNOSIS — E668 Other obesity: Secondary | ICD-10-CM

## 2022-10-11 DIAGNOSIS — R5383 Other fatigue: Secondary | ICD-10-CM

## 2022-10-11 DIAGNOSIS — Z6841 Body Mass Index (BMI) 40.0 and over, adult: Secondary | ICD-10-CM

## 2022-10-11 DIAGNOSIS — E65 Localized adiposity: Secondary | ICD-10-CM

## 2022-10-11 DIAGNOSIS — E782 Mixed hyperlipidemia: Secondary | ICD-10-CM | POA: Diagnosis not present

## 2022-10-12 ENCOUNTER — Other Ambulatory Visit: Payer: Self-pay | Admitting: Cardiology

## 2022-10-12 ENCOUNTER — Ambulatory Visit: Payer: Medicare Other | Attending: Cardiology | Admitting: Cardiology

## 2022-10-12 ENCOUNTER — Encounter: Payer: Self-pay | Admitting: Cardiology

## 2022-10-12 VITALS — BP 114/66 | HR 80 | Ht 66.0 in | Wt 253.2 lb

## 2022-10-12 DIAGNOSIS — I4819 Other persistent atrial fibrillation: Secondary | ICD-10-CM

## 2022-10-12 DIAGNOSIS — I2510 Atherosclerotic heart disease of native coronary artery without angina pectoris: Secondary | ICD-10-CM

## 2022-10-12 DIAGNOSIS — D6869 Other thrombophilia: Secondary | ICD-10-CM | POA: Diagnosis not present

## 2022-10-12 MED ORDER — AMIODARONE HCL 200 MG PO TABS: ORAL_TABLET | ORAL | 0 refills | Status: DC

## 2022-10-12 MED ORDER — AMIODARONE HCL 200 MG PO TABS: 200.0000 mg | ORAL_TABLET | Freq: Every day | ORAL | 1 refills | Status: DC

## 2022-10-12 NOTE — Patient Instructions (Addendum)
Medication Instructions:  Your physician has recommended you make the following change in your medication:  START Amiodarone  - take 2 tablets (400 mg total) TWICE a day for 2 weeks, then  - take 1 tablet (200 mg total) TWICE a day for 2 weeks, then  - take 1 tablet (200 mg total) ONCE a day   *If you need a refill on your cardiac medications before your next appointment, please call your pharmacy*   Lab Work: None ordered If you have labs (blood work) drawn today and your tests are completely normal, you will receive your results only by: MyChart Message (if you have MyChart) OR A paper copy in the mail If you have any lab test that is abnormal or we need to change your treatment, we will call you to review the results.   Testing/Procedures: None ordered   Follow-Up: At [facility], you and your health needs are our priority.  As part of our continuing mission to provide you with exceptional heart care, we have created designated Provider Care Teams.  These Care Teams include your primary Cardiologist (physician) and Advanced Practice Providers (APPs -  Physician Assistants and Nurse Practitioners) who all work together to provide you with the care you need, when you need it.  Your next appointment:   2 week(s) after your cardioversion  The format for your next appointment:   In Person  Provider:   You will follow up in the [facility]. Your provider will be: Roderic Palau, NP or Clint R. Fenton, PA-C    Thank you for choosing [facility]!!   Trinidad Curet, RN [phone]  Other Instructions  Please have [facility] physician fax [place] your INRs.  You will need weekly INRs for upcoming cardioversion in December  Important Information About Sugar

## 2022-10-12 NOTE — Progress Notes (Signed)
Electrophysiology Office Note   Date:  10/12/2022   ID:  Ikey, Omary 12/19/1950, MRN [id number]  PCP:  Orpah Melter, MD  Cardiologist:  Gwenlyn Found Primary Electrophysiologist:  Benzion Mesta Meredith Leeds, MD    Chief Complaint: AF   History of Present Illness: Eduardo Butler is a 71 y.o. male who is being seen today for the evaluation of AF at the request of Orpah Melter, MD. Presenting today for electrophysiology evaluation.  Has a history significant for coronary artery disease post CABG x4, PE due to lupus anticoagulant on long-term Coumadin, hyperlipidemia, atrial fibrillation.  He states that he has been in atrial fibrillation for the last few months.  He has symptoms of weakness, fatigue, shortness of breath.  He is unable to do all of his daily activities due to restrictions of weakness and fatigue.  He states that he is unable to walk up hills due to his shortness of breath.  Today, denies symptoms of palpitations, chest pain, orthopnea, PND, lower extremity edema, claudication, dizziness, presyncope, syncope, bleeding, or neurologic sequela. The patient is tolerating medications without difficulties.     Past Medical History:  Diagnosis Date   Anxiety    Arthritis    CHF (congestive heart failure) ([place])    Coronary artery disease    Diabetes ([place])    H/O cardiac catheterization 08/25/2006   normal cath-patent grafts   H/O Doppler ultrasound 02/21/2011   lower ext.venous doppler,, no treatment except support stockings are recommended if clinically indicated   H/O echocardiogram 12/02/2010   EF 55% , no significant abnormalities   History of stress test 01/04/2013   Lexiscan Myoview ; scattered PVC's ; LV EF 54% ; Normal stress test    Hyperlipidemia    Hypertension    Obesity    Peroneal DVT (deep venous thrombosis) ([facility])    Shortness of breath    Past Surgical History:  Procedure Laterality Date   BYPASS GRAFT  2003   4 bypass    CARDIAC CATHETERIZATION   02-15-02   S/P sudden cardiac death; three vessel coronary artery disease; preserved overall left ventricular systolic function with loculated mild to moderate anterolateral hypokinesis; no mitral regurgitation noted   CORONARY ARTERY BYPASS GRAFT  2003   HERNIA REPAIR     LEFT HEART CATH AND CORS/GRAFTS ANGIOGRAPHY N/A 04/23/2020   Procedure: LEFT HEART CATH AND CORS/GRAFTS ANGIOGRAPHY;  Surgeon: Lorretta Harp, MD;  Location: [facility];  Service: Cardiovascular;  Laterality: N/A;   LUMBAR LAMINECTOMY/ DECOMPRESSION WITH MET-RX Left 07/08/2014   Procedure: Left Lumbar Four-Five Metrex foraminal microdiskectomy;  Surgeon: Consuella Lose, MD;  Location: [facility];  Service: Neurosurgery;  Laterality: Left;  Left Lumbar Four-Five Metrex foraminal microdiskectomy   PELVIC LAPAROSCOPY  2005   SHOULDER SURGERY     UMBILICAL HERNIA REPAIR  11/17/2011   Procedure: HERNIA REPAIR UMBILICAL ADULT;  Surgeon: Harl Bowie, MD;  Location: [facility];  Service: General;  Laterality: N/A;  umbilical hernia repair with mesh     Current Outpatient Medications  Medication Sig Dispense Refill   acetaminophen (TYLENOL) 500 MG tablet Take 1,000-1,500 mg by mouth every 6 (six) hours as needed for moderate pain.     albuterol (VENTOLIN HFA) 108 (90 Base) MCG/ACT inhaler Inhale 2 puffs into the lungs every 6 (six) hours as needed for shortness of breath.      ALPRAZolam (XANAX) 0.5 MG tablet Take 0.5 mg by mouth daily as needed for anxiety  or sleep.      amiodarone (PACERONE) 200 MG tablet Take 2 tablets (400 mg total) TWICE daily for 2 weeks, then take 1 tablet (200 mg total) TWICE daily for 2 weeks, then take 1 tablet (200 mg  total) ONCE daily 84 tablet 0   [START ON 11/07/2022] amiodarone (PACERONE) 200 MG tablet Take 1 tablet (200 mg total) by mouth daily. 90 tablet 1   ammonium lactate (LAC-HYDRIN) 12 % lotion Apply 1 application. topically 2 (two) times daily.      Ascorbic Acid (VITAMIN C) 1000 MG tablet Take 1,000 mg by mouth daily.     aspirin EC 81 MG tablet Take 81 mg by mouth daily.     carvedilol (COREG) 3.125 MG tablet TAKE 1 TABLET(3.125 MG) BY MOUTH TWICE DAILY 180 tablet 3   carvedilol (COREG) 6.25 MG tablet Take 1 tablet (6.25 mg total) by mouth 2 (two) times daily with a meal. 180 tablet 3   cetirizine (ZYRTEC) 10 MG tablet Take 1 tablet (10 mg total) by mouth once as needed for up to 1 dose for allergies or rhinitis. 5 tablet 0   Elastic Bandages & Supports (MEDICAL COMPRESSION STOCKINGS) MISC Knee High 20-30 mm compression stockings 2 each 0   ezetimibe (ZETIA) 10 MG tablet Take 1 tablet (10 mg total) by mouth daily. 100 tablet 3   fluticasone (FLONASE) 50 MCG/ACT nasal spray Place 1 spray into both nostrils daily as needed for allergies or rhinitis.     folic acid (FOLVITE) 1 MG tablet Take 1 mg by mouth daily.     furosemide (LASIX) 80 MG tablet TAKE 1 TABLET(80 MG) BY MOUTH DAILY 90 tablet 2   HYDROcodone-acetaminophen (NORCO/VICODIN) 5-325 MG tablet Take 1 tablet by mouth every 6 (six) hours as needed.     losartan (COZAAR) 25 MG tablet TAKE 1 TABLET(25 MG) BY MOUTH DAILY 90 tablet 3   metFORMIN (GLUCOPHAGE) 1000 MG tablet Take 1,000 mg by mouth 2 (two) times daily with a meal.     Misc Natural Products (IMMUNE FORMULA PO) Take 1 tablet by mouth daily. Immune: Vitamin C, D and Zinc. Chew 1 tablets daily.     omeprazole (PRILOSEC) 40 MG capsule TAKE ONE CAPSULE BY MOUTH DAILY AS NEEDED FOR HEARTBURN 90 capsule 3   pioglitazone (ACTOS) 45 MG tablet Take 45 mg by mouth daily.      simvastatin (ZOCOR) 40 MG tablet Take 1 tablet (40 mg total) by mouth daily at 6 PM. 30 tablet 11   warfarin (COUMADIN) 4 MG tablet Take 4 mg by mouth See admin instructions. Take Sunday and thursday     warfarin (COUMADIN) 5 MG tablet Take 5 mg by mouth See admin instructions. Take on Monday, Tuesday, Wednesday, Friday and Saturday     No current  facility-administered medications for this visit.    Allergies:   Morphine and related   Social History:  The patient  reports that he has never smoked. He has never used smokeless tobacco. He reports that he does not drink alcohol and does not use drugs.   Family History:  The patient's family history includes Diabetes in his brother and mother; Hypertension in his brother.   ROS:  Please see the history of present illness.   Otherwise, review of systems is positive for none.   All other systems are reviewed and negative.   PHYSICAL EXAM: VS:  BP 114/66   Pulse 80   Ht _0  (1.676 m)   Wt  253 lb 3.2 oz (114.9 kg)   SpO2 93%   BMI 40.87 kg/m  , BMI Body mass index is 40.87 kg/m. GEN: Well nourished, well developed, in no acute distress  HEENT: normal  Neck: no JVD, carotid bruits, or masses Cardiac: irregular; no murmurs, rubs, or gallops,no edema  Respiratory:  clear to auscultation bilaterally, normal work of breathing GI: soft, nontender, nondistended, + BS MS: no deformity or atrophy  Skin: warm and dry Neuro:  Strength and sensation are intact Psych: euthymic mood, full affect  EKG:  EKG is not ordered today. Personal review of the ekg ordered 09/27/22 shows atrial fibrillation   Recent Labs: No results found for requested labs within last 365 days.    Lipid Panel     Component Value Date/Time   CHOL 117 10/21/2020 1152   TRIG 67 10/21/2020 1152   HDL 44 10/21/2020 1152   CHOLHDL 2.7 10/21/2020 1152   CHOLHDL 3.6 06/26/2007 1510   VLDL 25 06/26/2007 1510   LDLCALC 59 10/21/2020 1152     Wt Readings from Last 3 Encounters:  10/12/22 253 lb 3.2 oz (114.9 kg)  10/11/22 249 lb (112.9 kg)  09/27/22 257 lb (116.6 kg)      Other studies Reviewed: Additional studies/ records that were reviewed today include: TTE 04/21/21  Review of the above records today demonstrates:   1. Left ventricular ejection fraction, by estimation, is 55 to 60%. The  left ventricle  has normal function. The left ventricle has no regional  wall motion abnormalities. There is mild left ventricular hypertrophy.  Left ventricular diastolic parameters  were normal.   2. Right ventricular systolic function is normal. The right ventricular  size is normal.   3. The mitral valve is grossly normal. Trivial mitral valve  regurgitation.   4. The aortic valve is tricuspid. Aortic valve regurgitation is not  visualized.   5. The inferior vena cava is normal in size with greater than 50%  respiratory variability, suggesting right atrial pressure of 3 mmHg.    ASSESSMENT AND PLAN:  1.  Persistent atrial fibrillation: CHA2DS2-VASc of 3.  Currently on Coumadin.  He has been in atrial fibrillation for the last few months and feeling quite poorly with shortness of breath, weakness, fatigue.  He would prefer a rhythm control strategy.  We discussed dofetilide, amiodarone, and ablation.  At this point, he would prefer to avoid procedures and would prefer to avoid hospital stays for dofetilide loading.  We Vona Whiters start him on amiodarone.  We Travelle Mcclimans plan for cardioversion after 2 weeks.  He gets his INR checked by his primary physician.  We Melchizedek Espinola work to obtain those records prior to cardioversion.  2.  Coronary artery disease: Status post CABG x4.  No current chest pain.  Plan per primary cardiology.  3.  Hypertension: Currently well controlled  4.  Hyperlipidemia: Continue simvastatin per primary cardiology  5.  Secondary hypercoagulable state: Currently on Coumadin for atrial fibrillation as above   Current medicines are reviewed at length with the patient today.   The patient does not have concerns regarding his medicines.  The following changes were made today:  none  Labs/ tests ordered today include:  No orders of the defined types were placed in this encounter.    Disposition:   FU with Adam Sanjuan 3 months  Signed, Sloan Galentine Meredith Leeds, MD  10/12/2022 5:16 PM     [facility] [address] [place] [address] [phone] (  office) [phone] (fax)

## 2022-10-13 DIAGNOSIS — Z7901 Long term (current) use of anticoagulants: Secondary | ICD-10-CM | POA: Diagnosis not present

## 2022-10-13 DIAGNOSIS — I4891 Unspecified atrial fibrillation: Secondary | ICD-10-CM | POA: Diagnosis not present

## 2022-10-17 ENCOUNTER — Ambulatory Visit (INDEPENDENT_AMBULATORY_CARE_PROVIDER_SITE_OTHER): Payer: Medicare Other | Admitting: Bariatrics

## 2022-10-17 DIAGNOSIS — Z0289 Encounter for other administrative examinations: Secondary | ICD-10-CM

## 2022-10-19 DIAGNOSIS — I4891 Unspecified atrial fibrillation: Secondary | ICD-10-CM | POA: Diagnosis not present

## 2022-10-19 DIAGNOSIS — Z7901 Long term (current) use of anticoagulants: Secondary | ICD-10-CM | POA: Diagnosis not present

## 2022-10-24 ENCOUNTER — Ambulatory Visit (INDEPENDENT_AMBULATORY_CARE_PROVIDER_SITE_OTHER): Payer: Medicare Other | Admitting: Bariatrics

## 2022-10-24 ENCOUNTER — Encounter: Payer: Self-pay | Admitting: Bariatrics

## 2022-10-24 ENCOUNTER — Ambulatory Visit: Payer: Medicare Other | Admitting: Cardiovascular Disease

## 2022-10-24 VITALS — BP 115/70 | HR 77 | Temp 97.5°F | Ht 64.0 in | Wt 253.0 lb

## 2022-10-24 DIAGNOSIS — E668 Other obesity: Secondary | ICD-10-CM | POA: Diagnosis not present

## 2022-10-24 DIAGNOSIS — Z1331 Encounter for screening for depression: Secondary | ICD-10-CM | POA: Diagnosis not present

## 2022-10-24 DIAGNOSIS — E782 Mixed hyperlipidemia: Secondary | ICD-10-CM

## 2022-10-24 DIAGNOSIS — E65 Localized adiposity: Secondary | ICD-10-CM

## 2022-10-24 DIAGNOSIS — E559 Vitamin D deficiency, unspecified: Secondary | ICD-10-CM

## 2022-10-24 DIAGNOSIS — Z6841 Body Mass Index (BMI) 40.0 and over, adult: Secondary | ICD-10-CM

## 2022-10-24 DIAGNOSIS — R7303 Prediabetes: Secondary | ICD-10-CM | POA: Diagnosis not present

## 2022-10-24 DIAGNOSIS — G4733 Obstructive sleep apnea (adult) (pediatric): Secondary | ICD-10-CM

## 2022-10-24 DIAGNOSIS — R0602 Shortness of breath: Secondary | ICD-10-CM | POA: Diagnosis not present

## 2022-10-24 DIAGNOSIS — I10 Essential (primary) hypertension: Secondary | ICD-10-CM | POA: Diagnosis not present

## 2022-10-24 DIAGNOSIS — R5383 Other fatigue: Secondary | ICD-10-CM

## 2022-10-24 NOTE — Progress Notes (Unsigned)
Office: [phone]  /  Fax: [phone]   Initial Visit  Eduardo Butler was seen in clinic today to evaluate for obesity. He is interested in losing weight to improve overall health and reduce the risk of weight related complications. He presents today to review program treatment options, initial physical assessment, and evaluation.     He was referred by: {emreferby:28303}  When asked what else they would like to accomplish? He states: {EMHopetoaccomplish:28304}  When asked how has your weight affected you? He states: {EMWeightAffected:28305}  Some associated conditions: {EMSomeConditions:28306}  Contributing factors: {EMcontributingfactors:28307}  Weight promoting medications identified: {EMWeightpromotingrx:28308}  Current nutrition plan: {EMNutritionplan:28309}  Current level of physical activity: {EMcurrentPA:28310::"None"}  Current or previous pharmacotherapy: {EM previousRx:28311}  Response to medication: {EMResponsetomedication:28312}   Past medical history includes:   Past Medical History:  Diagnosis Date   Anxiety    Arthritis    CHF (congestive heart failure) ([facility])    Coronary artery disease    Diabetes ([place])    H/O cardiac catheterization 08/25/2006   normal cath-patent grafts   H/O Doppler ultrasound 02/21/2011   lower ext.venous doppler,, no treatment except support stockings are recommended if clinically indicated   H/O echocardiogram 12/02/2010   EF 55% , no significant abnormalities   History of stress test 01/04/2013   Lexiscan Myoview ; scattered PVC's ; LV EF 54% ; Normal stress test    Hyperlipidemia    Hypertension    Obesity    Peroneal DVT (deep venous thrombosis) ([facility])    Shortness of breath      Objective:   BP 117/74   Pulse 76   Temp 97.6 F (36.4 C)   Ht '5\' 6"'$  (1.676 m)   Wt 249 lb (112.9 kg)   SpO2 98%   BMI 40.19 kg/m  He was weighed on the bioimpedance scale: Body mass index is 40.19 kg/m.  Peak Weight:*** ,Visceral Fat  Rating:***, Body Fat%:***, Weight trend over the last 12 months: {emweighttrend:28333}  General:  Alert, oriented and cooperative. Patient is in no acute distress.  Respiratory: Normal respiratory effort, no problems with respiration noted  Extremities: Normal range of motion.    Mental Status: Normal mood and affect. Normal behavior. Normal judgment and thought content.   Assessment and Plan:  1. Visceral obesity ***  2. Other fatigue *** - EKG 12-Lead  3. Mixed hyperlipidemia ***  4. Obstructive sleep apnea ***  5. Essential hypertension ***  6. Obesity, Current BMI 40.3 ***   We reviewed weight, biometrics, associated medical conditions and contributing factors with patient. He would benefit from weight loss therapy via a modified calorie, low-carb, high-protein nutritional plan tailored to their REE (resting energy expenditure) which will be determined by indirect calorimetry.  We will also assess for cardiometabolic risk and nutritional derangements via fasting serologies at his next appointment.      Obesity Treatment / Action Plan:  {EMobesityactionplanscribe:28314::"Patient will work on garnering support from family and friends to begin weight loss journey.","Will work on eliminating or reducing the presence of highly palatable, calorie dense foods in the home.","Will complete provided nutritional and psychosocial assessment questionnaire before the next appointment.","Will be scheduled for indirect calorimetry to determine resting energy expenditure in a fasting state.  This will allow [place] to create a reduced calorie, high-protein meal plan to promote loss of fat mass while preserving muscle mass."}  Obesity Education Performed Today:  He was weighed on the bioimpedance scale and results were discussed and documented in the synopsis.  We discussed  obesity as a disease and the importance of a more detailed evaluation of all the factors contributing to the disease.  We  discussed the importance of long term lifestyle changes which include nutrition, exercise and behavioral modifications as well as the importance of customizing this to his specific health and social needs.  We discussed the benefits of reaching a healthier weight to alleviate the symptoms of existing conditions and reduce the risks of the biomechanical, metabolic and psychological effects of obesity.  Eduardo Butler appears to be in the action stage of change and states they are ready to start intensive lifestyle modifications and behavioral modifications.  30 minutes was spent today on this visit including the above counseling, pre-visit chart review, and post-visit documentation.  Reviewed by clinician on day of visit: allergies, medications, problem list, medical history, surgical history, family history, social history, and previous encounter notes.   Wilhemena Durie, am acting as transcriptionist for [employer], DO   I have reviewed the above documentation for accuracy and completeness, and I agree with the above. ***

## 2022-10-25 DIAGNOSIS — E559 Vitamin D deficiency, unspecified: Secondary | ICD-10-CM | POA: Insufficient documentation

## 2022-10-25 DIAGNOSIS — R0602 Shortness of breath: Secondary | ICD-10-CM | POA: Insufficient documentation

## 2022-10-25 DIAGNOSIS — E65 Localized adiposity: Secondary | ICD-10-CM | POA: Insufficient documentation

## 2022-10-25 DIAGNOSIS — R7303 Prediabetes: Secondary | ICD-10-CM | POA: Insufficient documentation

## 2022-10-25 DIAGNOSIS — Z6841 Body Mass Index (BMI) 40.0 and over, adult: Secondary | ICD-10-CM | POA: Insufficient documentation

## 2022-10-25 DIAGNOSIS — E1169 Type 2 diabetes mellitus with other specified complication: Secondary | ICD-10-CM | POA: Insufficient documentation

## 2022-10-25 DIAGNOSIS — R0609 Other forms of dyspnea: Secondary | ICD-10-CM | POA: Insufficient documentation

## 2022-10-25 DIAGNOSIS — R5383 Other fatigue: Secondary | ICD-10-CM | POA: Insufficient documentation

## 2022-10-25 DIAGNOSIS — E66813 Obesity, class 3: Secondary | ICD-10-CM | POA: Insufficient documentation

## 2022-10-26 DIAGNOSIS — Z7901 Long term (current) use of anticoagulants: Secondary | ICD-10-CM | POA: Diagnosis not present

## 2022-10-26 DIAGNOSIS — I4891 Unspecified atrial fibrillation: Secondary | ICD-10-CM | POA: Diagnosis not present

## 2022-10-26 LAB — COMPREHENSIVE METABOLIC PANEL
ALT: 13 IU/L (ref 0–44)
AST: 18 IU/L (ref 0–40)
Albumin/Globulin Ratio: 2 (ref 1.2–2.2)
Albumin: 4.3 g/dL (ref 3.8–4.8)
Alkaline Phosphatase: 64 IU/L (ref 44–121)
BUN/Creatinine Ratio: 17 (ref 10–24)
BUN: 17 mg/dL (ref 8–27)
Bilirubin Total: 0.4 mg/dL (ref 0.0–1.2)
CO2: 27 mmol/L (ref 20–29)
Calcium: 9 mg/dL (ref 8.6–10.2)
Chloride: 99 mmol/L (ref 96–106)
Creatinine, Ser: 0.98 mg/dL (ref 0.76–1.27)
Globulin, Total: 2.2 g/dL (ref 1.5–4.5)
Glucose: 113 mg/dL — ABNORMAL HIGH (ref 70–99)
Potassium: 4.2 mmol/L (ref 3.5–5.2)
Sodium: 141 mmol/L (ref 134–144)
Total Protein: 6.5 g/dL (ref 6.0–8.5)
eGFR: 82 mL/min/{1.73_m2} (ref >59)

## 2022-10-26 LAB — INSULIN, RANDOM: INSULIN: 8.2 u[IU]/mL (ref 2.6–24.9)

## 2022-10-26 LAB — VITAMIN D 25 HYDROXY (VIT D DEFICIENCY, FRACTURES): Vit D, 25-Hydroxy: 33.8 ng/mL (ref 30.0–100.0)

## 2022-10-27 ENCOUNTER — Telehealth: Payer: Self-pay | Admitting: Cardiology

## 2022-10-27 NOTE — Telephone Encounter (Signed)
Patient is requesting to speak directly with Dr. Curt Bears' nurse to discuss his coumadin.

## 2022-10-27 NOTE — Telephone Encounter (Signed)
Aware I will call his PCP tomorrow.

## 2022-10-31 ENCOUNTER — Ambulatory Visit: Payer: Medicare Other | Admitting: Bariatrics

## 2022-11-01 ENCOUNTER — Telehealth: Payer: Self-pay | Admitting: *Deleted

## 2022-11-01 DIAGNOSIS — J069 Acute upper respiratory infection, unspecified: Secondary | ICD-10-CM | POA: Diagnosis not present

## 2022-11-01 DIAGNOSIS — Z03818 Encounter for observation for suspected exposure to other biological agents ruled out: Secondary | ICD-10-CM | POA: Diagnosis not present

## 2022-11-01 DIAGNOSIS — R051 Acute cough: Secondary | ICD-10-CM | POA: Diagnosis not present

## 2022-11-01 DIAGNOSIS — R062 Wheezing: Secondary | ICD-10-CM | POA: Diagnosis not present

## 2022-11-01 MED ORDER — ROSUVASTATIN CALCIUM 20 MG PO TABS: 20.0000 mg | ORAL_TABLET | Freq: Every day | ORAL | 6 refills | Status: DC

## 2022-11-01 NOTE — Telephone Encounter (Signed)
Pt advised of MD recommendation secondary to start of Amiodarone therapy. Patient verbalized understanding and agreeable to plan.

## 2022-11-01 NOTE — Telephone Encounter (Signed)
Camnitz, Will Hassell Done, MD  Stanton Kidney, RN If patient is on simvastatin, switch to rosuvastatin 20 mg daily.

## 2022-11-02 ENCOUNTER — Encounter: Payer: Self-pay | Admitting: *Deleted

## 2022-11-02 NOTE — Telephone Encounter (Signed)
Pt scheduled for DCCV on 12/21. Reviewed instructions verbally and aware they will be sent via mychart. Pt getting his INR checked again at PCP tomorrow. Pt agreeable to plan.

## 2022-11-03 ENCOUNTER — Telehealth: Payer: Self-pay | Admitting: Cardiology

## 2022-11-03 DIAGNOSIS — Z7901 Long term (current) use of anticoagulants: Secondary | ICD-10-CM | POA: Diagnosis not present

## 2022-11-03 NOTE — Telephone Encounter (Signed)
Patient calling with questions/concern bout his up coming procedure. Please advise

## 2022-11-03 NOTE — Telephone Encounter (Signed)
Pt thought Dr. Curt Bears would doing the DCCV. Aware he would not and this was standard protocol. Pt fine with this, just wanted to make sure. He appreciates my return call.

## 2022-11-07 ENCOUNTER — Encounter: Payer: Self-pay | Admitting: Bariatrics

## 2022-11-07 ENCOUNTER — Other Ambulatory Visit: Payer: Self-pay

## 2022-11-07 ENCOUNTER — Ambulatory Visit (INDEPENDENT_AMBULATORY_CARE_PROVIDER_SITE_OTHER): Payer: Medicare Other | Admitting: Bariatrics

## 2022-11-07 VITALS — BP 137/68 | HR 72 | Temp 97.9°F | Ht 64.0 in | Wt 255.0 lb

## 2022-11-07 DIAGNOSIS — E559 Vitamin D deficiency, unspecified: Secondary | ICD-10-CM | POA: Diagnosis not present

## 2022-11-07 DIAGNOSIS — Z6841 Body Mass Index (BMI) 40.0 and over, adult: Secondary | ICD-10-CM | POA: Diagnosis not present

## 2022-11-07 DIAGNOSIS — E669 Obesity, unspecified: Secondary | ICD-10-CM | POA: Diagnosis not present

## 2022-11-07 DIAGNOSIS — E1169 Type 2 diabetes mellitus with other specified complication: Secondary | ICD-10-CM | POA: Diagnosis not present

## 2022-11-07 DIAGNOSIS — Z7984 Long term (current) use of oral hypoglycemic drugs: Secondary | ICD-10-CM | POA: Diagnosis not present

## 2022-11-07 DIAGNOSIS — E782 Mixed hyperlipidemia: Secondary | ICD-10-CM

## 2022-11-07 MED ORDER — VITAMIN D (ERGOCALCIFEROL) 1.25 MG (50000 UNIT) PO CAPS: 50000.0000 [IU] | ORAL_CAPSULE | ORAL | 0 refills | Status: DC

## 2022-11-07 NOTE — Progress Notes (Unsigned)
Chief Complaint:   OBESITY Eduardo Butler (MR# [id number]) is a 71 y.o. male who presents for evaluation and treatment of obesity and related comorbidities. Current BMI is Body mass index is 43.43 kg/m. Eduardo Butler has been struggling with his weight for many years and has been unsuccessful in either losing weight, maintaining weight loss, or reaching his healthy weight goal.  Bari attended the information session on 10/11/2022.  Eduardo Butler is currently in the action stage of change and ready to dedicate time achieving and maintaining a healthier weight. Eduardo Butler is interested in becoming our patient and working on intensive lifestyle modifications including (but not limited to) diet and exercise for weight loss.  Eduardo Butler's habits were reviewed today and are as follows: His family eats meals together, he thinks his family will eat healthier with him, his desired weight loss is 53 lbs, he has been heavy most of his life, he started gaining weight at 40, his heaviest weight ever was 250 pounds, he has significant food cravings issues, he wakes up frequently in the middle of the night to eat, he skips meals frequently, he is frequently drinking liquids with calories, he frequently makes poor food choices, he frequently eats larger portions than normal, and he struggles with emotional eating.  Depression Screen [name]'s Food and Mood (modified PHQ-9) score was 12.  Subjective:   1. Other fatigue [name] admits to daytime somnolence and admits to waking up still tired. Patient has a history of symptoms of daytime fatigue and morning fatigue. [name] generally gets 4 or 7 hours of sleep per night, and states that he has nightime awakenings. Snoring is not present. Apneic episodes are present. Epworth Sleepiness Score is 6.   2. SOB (shortness of breath) on exertion Levelle notes increasing shortness of breath with exercising and seems to be worsening over time with weight gain. He notes getting out of breath sooner with  activity than he used to. This has not gotten worse recently. Tomoki denies shortness of breath at rest or orthopnea.  3. Mixed hyperlipidemia Coden is taking Zocor, and he has had recent labs done.   4. Visceral obesity Raiford's visceral fat % is 30.  5. Obstructive sleep apnea Laquinn wears his CPAP nightly.   6. Essential hypertension Daryll's blood pressure is well controlled.   7. Pre-diabetes Tyjuan's last A1c was 3 months ago.   8. Vitamin D deficiency Boykin is currently taking Vitamin D supplementation.   Assessment/Plan:   1. Other fatigue Eduardo Butler does feel that his weight is causing his energy to be lower than it should be. Fatigue may be related to obesity, depression or many other causes. Labs will be ordered, and in the meanwhile, Eduardo Butler will focus on self care including making healthy food choices, increasing physical activity and focusing on stress reduction.  2. SOB (shortness of breath) on exertion Eduardo Butler does feel that he gets out of breath more easily that he used to when he exercises. Eduardo Butler's shortness of breath appears to be obesity related and exercise induced. He has agreed to work on weight loss and gradually increase exercise to treat his exercise induced shortness of breath. Will continue to monitor closely.  3. Mixed hyperlipidemia We will check labs today, and Eduardo Butler will continue Zocor as directed.   - Comprehensive metabolic panel  4. Visceral obesity Eduardo Butler will work on his diet and exercise.   5. Obstructive sleep apnea Eduardo Butler will continue with his CPAP compliance.   6. Essential hypertension We will check  labs today. Eduardo Butler will use a salt substitute.   - Comprehensive metabolic panel  7. Pre-diabetes We will check labs today, and we will follow-up at [facility] next visit.   - Insulin, random  8. Vitamin D deficiency We will check labs today, and we will follow-up at [facility] next visit.   - VITAMIN D 25 Hydroxy (Vit-D Deficiency, Fractures)  9. Depression  screening Nimrod had a positive depression screening. Depression is commonly associated with obesity and often results in emotional eating behaviors. We will monitor this closely and work on CBT to help improve the non-hunger eating patterns. Referral to Psychology may be required if no improvement is seen as he continues in our clinic.  10. Obesity, Current BMI 40.3 Eduardo Butler is currently in the action stage of change and his goal is to continue with weight loss efforts. I recommend Azure begin the structured treatment plan as follows:  He has agreed to the Category 3 Plan.  Meal planning and intentional eating were discussed.   Exercise goals: No exercise has been prescribed at this time.   Behavioral modification strategies: increasing lean protein intake, decreasing simple carbohydrates, increasing vegetables, increasing water intake, decreasing eating out, no skipping meals, meal planning and cooking strategies, keeping healthy foods in the home, and planning for success.  He was informed of the importance of frequent follow-up visits to maximize his success with intensive lifestyle modifications for his multiple health conditions. He was informed we would discuss his lab results at his next visit unless there is a critical issue that needs to be addressed sooner. [name] agreed to keep his next visit at the agreed upon time to discuss these results.  Objective:   Blood pressure 115/70, pulse 77, temperature (!) 97.5 F (36.4 C), height '5\' 4"'$  (1.626 m), weight 253 lb (114.8 kg), SpO2 95 %. Body mass index is 43.43 kg/m.  EKG: Normal sinus rhythm, rate (unable to obtain).  Indirect Calorimeter completed today shows a VO2 of 310 and a REE of 2146.  His calculated basal metabolic rate is 8242 thus his basal metabolic rate is better than expected.  General: Cooperative, alert, well developed, in no acute distress. HEENT: Conjunctivae and lids unremarkable. Cardiovascular: Regular rhythm.  Lungs:  Normal work of breathing. Neurologic: No focal deficits.   Lab Results  Component Value Date   CREATININE 0.98 10/24/2022   BUN 17 10/24/2022   NA 141 10/24/2022   K 4.2 10/24/2022   CL 99 10/24/2022   CO2 27 10/24/2022   Lab Results  Component Value Date   ALT 13 10/24/2022   AST 18 10/24/2022   ALKPHOS 64 10/24/2022   BILITOT 0.4 10/24/2022   No results found for: "HGBA1C" Lab Results  Component Value Date   INSULIN 8.2 10/24/2022   Lab Results  Component Value Date   TSH 1.454 ***Test methodology is 3rd generation TSH*** 06/26/2007   Lab Results  Component Value Date   CHOL 117 10/21/2020   HDL 44 10/21/2020   LDLCALC 59 10/21/2020   TRIG 67 10/21/2020   CHOLHDL 2.7 10/21/2020   Lab Results  Component Value Date   WBC 8.5 04/15/2020   HGB 11.7 (L) 04/15/2020   HCT 36.1 (L) 04/15/2020   MCV 86 04/15/2020   PLT 278 04/15/2020   No results found for: "IRON", "TIBC", "FERRITIN"  Attestation Statements:   Reviewed by clinician on day of visit: allergies, medications, problem list, medical history, surgical history, family history, social history, and previous encounter notes.  Wilhemena Durie, am acting as Location manager for [employer], DO.  I have reviewed the above documentation for accuracy and completeness, and I agree with the above. - ***

## 2022-11-08 ENCOUNTER — Encounter: Payer: Self-pay | Admitting: Bariatrics

## 2022-11-10 DIAGNOSIS — Z7901 Long term (current) use of anticoagulants: Secondary | ICD-10-CM | POA: Diagnosis not present

## 2022-11-10 NOTE — H&P (View-Only) (Signed)
Electrophysiology Office Note   Date:  11/16/2022   ID:  Eduardo Butler 1951/11/14, MRN [id number]  PCP:  Orpah Melter, MD  Cardiologist:  Gwenlyn Found Primary Electrophysiologist:  Shirley Friar, PA-C    Chief Complaint: AF   History of Present Illness: Eduardo Butler is a 71 y.o. male who is being seen today for the evaluation of AF at the request of Orpah Melter, MD. Presenting today for electrophysiology evaluation.  Has a history significant for coronary artery disease post CABG x4, PE due to lupus anticoagulant on long-term Coumadin, hyperlipidemia, atrial fibrillation.  He states that he has been in atrial fibrillation for the last few months.    Seen today due to distance from last visit.   He has symptoms of weakness, fatigue, shortness of breath.  He is unable to do all of his daily activities due to restrictions of weakness and fatigue.  He states that he is unable to walk up hills due to his shortness of breath.  He presents today for follow up prior to [facility]. He remains in AF. He continues to have fatigue and SOB, but has had less palpitations while on amiodarone. Taking coumadin as directed.    Past Medical History:  Diagnosis Date   Anxiety    Arthritis    CHF (congestive heart failure) ([place])    Coronary artery disease    Diabetes ([place])    H/O cardiac catheterization 08/25/2006   normal cath-patent grafts   H/O Doppler ultrasound 02/21/2011   lower ext.venous doppler,, no treatment except support stockings are recommended if clinically indicated   H/O echocardiogram 12/02/2010   EF 55% , no significant abnormalities   History of stress test 01/04/2013   Lexiscan Myoview ; scattered PVC's ; LV EF 54% ; Normal stress test    Hyperlipidemia    Hypertension    Obesity    Peroneal DVT (deep venous thrombosis) (HCC)    Shortness of breath    Past Surgical History:  Procedure Laterality Date   BYPASS GRAFT  2003   4 bypass    CARDIAC CATHETERIZATION   February 24, 2002   S/P sudden cardiac death; three vessel coronary artery disease; preserved overall left ventricular systolic function with loculated mild to moderate anterolateral hypokinesis; no mitral regurgitation noted   CORONARY ARTERY BYPASS GRAFT  2003   HERNIA REPAIR     LEFT HEART CATH AND CORS/GRAFTS ANGIOGRAPHY N/A 04/23/2020   Procedure: LEFT HEART CATH AND CORS/GRAFTS ANGIOGRAPHY;  Surgeon: Lorretta Harp, MD;  Location: [facility];  Service: Cardiovascular;  Laterality: N/A;   LUMBAR LAMINECTOMY/ DECOMPRESSION WITH MET-RX Left 07/08/2014   Procedure: Left Lumbar Four-Five Metrex foraminal microdiskectomy;  Surgeon: Consuella Lose, MD;  Location: [facility];  Service: Neurosurgery;  Laterality: Left;  Left Lumbar Four-Five Metrex foraminal microdiskectomy   PELVIC LAPAROSCOPY  2005   SHOULDER SURGERY     UMBILICAL HERNIA REPAIR  11/17/2011   Procedure: HERNIA REPAIR UMBILICAL ADULT;  Surgeon: Harl Bowie, MD;  Location: [facility];  Service: General;  Laterality: N/A;  umbilical hernia repair with mesh     Current Outpatient Medications  Medication Sig Dispense Refill   acetaminophen (TYLENOL) 500 MG tablet Take 1,000-1,500 mg by mouth every 6 (six) hours as needed for moderate pain.     albuterol (VENTOLIN HFA) 108 (90 Base) MCG/ACT inhaler Inhale 2 puffs into the lungs every 6 (six) hours as needed for shortness of breath.  ALPRAZolam (XANAX) 0.5 MG tablet Take 0.25 mg by mouth at bedtime.     amiodarone (PACERONE) 200 MG tablet Take 1 tablet (200 mg total) by mouth daily. 30 tablet 6   ammonium lactate (LAC-HYDRIN) 12 % lotion Apply 1 application  topically daily.     aspirin EC 81 MG tablet Take 81 mg by mouth daily.     carvedilol (COREG) 3.125 MG tablet TAKE 1 TABLET(3.125 MG) BY MOUTH TWICE DAILY (Patient taking differently: Take 3.125 mg by mouth 2 (two) times daily with a meal.) 180 tablet 3   cetirizine (ZYRTEC) 10 MG tablet Take 1  tablet (10 mg total) by mouth once as needed for up to 1 dose for allergies or rhinitis. (Patient taking differently: Take 10 mg by mouth daily as needed for allergies or rhinitis.) 5 tablet 0   Elastic Bandages & Supports (MEDICAL COMPRESSION STOCKINGS) MISC Knee High 20-30 mm compression stockings 2 each 0   ezetimibe (ZETIA) 10 MG tablet Take 1 tablet (10 mg total) by mouth daily. 100 tablet 3   fluticasone (FLONASE) 50 MCG/ACT nasal spray Place 1 spray into both nostrils daily as needed for allergies or rhinitis.     folic acid (FOLVITE) 1 MG tablet Take 1 mg by mouth daily.     furosemide (LASIX) 80 MG tablet TAKE 1 TABLET(80 MG) BY MOUTH DAILY 90 tablet 2   losartan (COZAAR) 25 MG tablet TAKE 1 TABLET(25 MG) BY MOUTH DAILY 90 tablet 3   metFORMIN (GLUCOPHAGE) 1000 MG tablet Take 1,000 mg by mouth 2 (two) times daily with a meal.     Misc Natural Products (IMMUNE FORMULA PO) Take 1 each by mouth daily. Immune: Vitamin C, D and Zinc. Chew 1 tablets daily.     omeprazole (PRILOSEC) 40 MG capsule TAKE ONE CAPSULE BY MOUTH DAILY AS NEEDED FOR HEARTBURN 90 capsule 3   pioglitazone (ACTOS) 45 MG tablet Take 45 mg by mouth daily.      rosuvastatin (CRESTOR) 20 MG tablet Take 1 tablet (20 mg total) by mouth daily. 30 tablet 6   Vitamin D, Ergocalciferol, (DRISDOL) 1.25 MG (50000 UNIT) CAPS capsule Take 1 capsule (50,000 Units total) by mouth every 7 (seven) days. 5 capsule 0   warfarin (COUMADIN) 3 MG tablet Take 3 mg by mouth daily.     warfarin (COUMADIN) 4 MG tablet Take 4 mg by mouth See admin instructions. Take Sunday and Tuesday and Thursday     warfarin (COUMADIN) 5 MG tablet Take 5 mg by mouth See admin instructions. Take on Monday,  Wednesday, Friday and Saturday     No current facility-administered medications for this visit.    Allergies:   Morphine and related   Social History:  The patient  reports that he has never smoked. He has never used smokeless tobacco. He reports that he does  not drink alcohol and does not use drugs.   Family History:  The patient's family history includes Diabetes in his brother and mother; Hypertension in his brother.   Review of systems complete and found to be negative unless listed in HPI.    PHYSICAL EXAM: VS:  Ht _0  (1.676 m)   Wt 257 lb 9.6 oz (116.8 kg)   SpO2 94%   BMI 41.58 kg/m  , BMI Body mass index is 41.58 kg/m.  General: Pleasant, NAD. No resp difficulty Psych: Normal affect. HEENT:  Normal, without mass or lesion.         Neck: Supple, no  bruits or JVD. Carotids 2+. No lymphadenopathy/thyromegaly appreciated. Heart: PMI nondisplaced. RRR no s3, s4, or murmurs. Lungs:  Resp regular and unlabored, CTA. Abdomen: Soft, non-tender, non-distended, No HSM, BS + x 4.   Extremities: No clubbing, cyanosis or edema. DP/PT/Radials 2+ and equal bilaterally. Neuro: Alert and oriented X 3. Moves all extremities spontaneously.   EKG:  EKG is ordered today. Personal review of todays EKG shows course AF vs flutter at 68 bpm Personal review of the ekg ordered 09/27/22 shows atrial fibrillation   Recent Labs: 10/24/2022: ALT 13; BUN 17; Creatinine, Ser 0.98; Potassium 4.2; Sodium 141    Lipid Panel     Component Value Date/Time   CHOL 128 11/24/2021 0000   CHOL 117 10/21/2020 1152   TRIG 131 11/24/2021 0000   HDL 47 11/24/2021 0000   HDL 44 10/21/2020 1152   CHOLHDL 2.7 10/21/2020 1152   CHOLHDL 3.6 06/26/2007 1510   VLDL 25 06/26/2007 1510   LDLCALC 82 11/24/2021 0000   LDLCALC 59 10/21/2020 1152     Wt Readings from Last 3 Encounters:  11/16/22 257 lb 9.6 oz (116.8 kg)  11/07/22 255 lb (115.7 kg)  10/24/22 253 lb (114.8 kg)      Other studies Reviewed: Additional studies/ records that were reviewed today include: TTE 04/21/21  Review of the above records today demonstrates:   1. Left ventricular ejection fraction, by estimation, is 55 to 60%. The  left ventricle has normal function. The left ventricle has no  regional  wall motion abnormalities. There is mild left ventricular hypertrophy.  Left ventricular diastolic parameters  were normal.   2. Right ventricular systolic function is normal. The right ventricular  size is normal.   3. The mitral valve is grossly normal. Trivial mitral valve  regurgitation.   4. The aortic valve is tricuspid. Aortic valve regurgitation is not  visualized.   5. The inferior vena cava is normal in size with greater than 50%  respiratory variability, suggesting right atrial pressure of 3 mmHg.    ASSESSMENT AND PLAN:  1.  Persistent atrial fibrillation: CHA2DS2-VASc of 3.   Continue coumadin  Remains symptomatic with SOB, weakness, fatigue.  Plan [facility].  Not currently interested in pursuing ablation  INR 3.3 today by check by pharmacist.   2.  Coronary artery disease: Status post CABG x4.  Denies s/s ischemia.   3.  HTN Stable on current regimen   4. HLD Continue statin   Current medicines are reviewed at length with the patient today.   The patient does not have concerns regarding his medicines.  The following changes were made today:  none  Labs/ tests ordered today include:  Orders Placed This Encounter  Procedures   Comprehensive metabolic panel   TSH   T4, free   EKG 12-Lead   Disposition:   FU with Dr. Curt Bears 4-6 weeks post [facility] to discuss other options if unsuccessful.   Jacalyn Lefevre, PA-C  11/16/2022 11:18 AM     [facility] [address] [place] [address] [phone] (office) [phone] (fax)

## 2022-11-10 NOTE — Progress Notes (Signed)
Electrophysiology Office Note   Date:  11/16/2022   ID:  Eduardo Butler, Eduardo Butler 1951/11/14, MRN [id number]  PCP:  Orpah Melter, MD  Cardiologist:  Gwenlyn Found Primary Electrophysiologist:  Shirley Friar, PA-C    Chief Complaint: AF   History of Present Illness: Eduardo Butler is a 71 y.o. male who is being seen today for the evaluation of AF at the request of Orpah Melter, MD. Presenting today for electrophysiology evaluation.  Has a history significant for coronary artery disease post CABG x4, PE due to lupus anticoagulant on long-term Coumadin, hyperlipidemia, atrial fibrillation.  He states that he has been in atrial fibrillation for the last few months.    Seen today due to distance from last visit.   He has symptoms of weakness, fatigue, shortness of breath.  He is unable to do all of his daily activities due to restrictions of weakness and fatigue.  He states that he is unable to walk up hills due to his shortness of breath.  He presents today for follow up prior to [facility]. He remains in AF. He continues to have fatigue and SOB, but has had less palpitations while on amiodarone. Taking coumadin as directed.    Past Medical History:  Diagnosis Date   Anxiety    Arthritis    CHF (congestive heart failure) ([place])    Coronary artery disease    Diabetes ([place])    H/O cardiac catheterization 08/25/2006   normal cath-patent grafts   H/O Doppler ultrasound 02/21/2011   lower ext.venous doppler,, no treatment except support stockings are recommended if clinically indicated   H/O echocardiogram 12/02/2010   EF 55% , no significant abnormalities   History of stress test 01/04/2013   Lexiscan Myoview ; scattered PVC's ; LV EF 54% ; Normal stress test    Hyperlipidemia    Hypertension    Obesity    Peroneal DVT (deep venous thrombosis) (HCC)    Shortness of breath    Past Surgical History:  Procedure Laterality Date   BYPASS GRAFT  2003   4 bypass    CARDIAC CATHETERIZATION   February 24, 2002   S/P sudden cardiac death; three vessel coronary artery disease; preserved overall left ventricular systolic function with loculated mild to moderate anterolateral hypokinesis; no mitral regurgitation noted   CORONARY ARTERY BYPASS GRAFT  2003   HERNIA REPAIR     LEFT HEART CATH AND CORS/GRAFTS ANGIOGRAPHY N/A 04/23/2020   Procedure: LEFT HEART CATH AND CORS/GRAFTS ANGIOGRAPHY;  Surgeon: Lorretta Harp, MD;  Location: [facility];  Service: Cardiovascular;  Laterality: N/A;   LUMBAR LAMINECTOMY/ DECOMPRESSION WITH MET-RX Left 07/08/2014   Procedure: Left Lumbar Four-Five Metrex foraminal microdiskectomy;  Surgeon: Consuella Lose, MD;  Location: [facility];  Service: Neurosurgery;  Laterality: Left;  Left Lumbar Four-Five Metrex foraminal microdiskectomy   PELVIC LAPAROSCOPY  2005   SHOULDER SURGERY     UMBILICAL HERNIA REPAIR  11/17/2011   Procedure: HERNIA REPAIR UMBILICAL ADULT;  Surgeon: Harl Bowie, MD;  Location: [facility];  Service: General;  Laterality: N/A;  umbilical hernia repair with mesh     Current Outpatient Medications  Medication Sig Dispense Refill   acetaminophen (TYLENOL) 500 MG tablet Take 1,000-1,500 mg by mouth every 6 (six) hours as needed for moderate pain.     albuterol (VENTOLIN HFA) 108 (90 Base) MCG/ACT inhaler Inhale 2 puffs into the lungs every 6 (six) hours as needed for shortness of breath.  ALPRAZolam (XANAX) 0.5 MG tablet Take 0.25 mg by mouth at bedtime.     amiodarone (PACERONE) 200 MG tablet Take 1 tablet (200 mg total) by mouth daily. 30 tablet 6   ammonium lactate (LAC-HYDRIN) 12 % lotion Apply 1 application  topically daily.     aspirin EC 81 MG tablet Take 81 mg by mouth daily.     carvedilol (COREG) 3.125 MG tablet TAKE 1 TABLET(3.125 MG) BY MOUTH TWICE DAILY (Patient taking differently: Take 3.125 mg by mouth 2 (two) times daily with a meal.) 180 tablet 3   cetirizine (ZYRTEC) 10 MG tablet Take 1  tablet (10 mg total) by mouth once as needed for up to 1 dose for allergies or rhinitis. (Patient taking differently: Take 10 mg by mouth daily as needed for allergies or rhinitis.) 5 tablet 0   Elastic Bandages & Supports (MEDICAL COMPRESSION STOCKINGS) MISC Knee High 20-30 mm compression stockings 2 each 0   ezetimibe (ZETIA) 10 MG tablet Take 1 tablet (10 mg total) by mouth daily. 100 tablet 3   fluticasone (FLONASE) 50 MCG/ACT nasal spray Place 1 spray into both nostrils daily as needed for allergies or rhinitis.     folic acid (FOLVITE) 1 MG tablet Take 1 mg by mouth daily.     furosemide (LASIX) 80 MG tablet TAKE 1 TABLET(80 MG) BY MOUTH DAILY 90 tablet 2   losartan (COZAAR) 25 MG tablet TAKE 1 TABLET(25 MG) BY MOUTH DAILY 90 tablet 3   metFORMIN (GLUCOPHAGE) 1000 MG tablet Take 1,000 mg by mouth 2 (two) times daily with a meal.     Misc Natural Products (IMMUNE FORMULA PO) Take 1 each by mouth daily. Immune: Vitamin C, D and Zinc. Chew 1 tablets daily.     omeprazole (PRILOSEC) 40 MG capsule TAKE ONE CAPSULE BY MOUTH DAILY AS NEEDED FOR HEARTBURN 90 capsule 3   pioglitazone (ACTOS) 45 MG tablet Take 45 mg by mouth daily.      rosuvastatin (CRESTOR) 20 MG tablet Take 1 tablet (20 mg total) by mouth daily. 30 tablet 6   Vitamin D, Ergocalciferol, (DRISDOL) 1.25 MG (50000 UNIT) CAPS capsule Take 1 capsule (50,000 Units total) by mouth every 7 (seven) days. 5 capsule 0   warfarin (COUMADIN) 3 MG tablet Take 3 mg by mouth daily.     warfarin (COUMADIN) 4 MG tablet Take 4 mg by mouth See admin instructions. Take Sunday and Tuesday and Thursday     warfarin (COUMADIN) 5 MG tablet Take 5 mg by mouth See admin instructions. Take on Monday,  Wednesday, Friday and Saturday     No current facility-administered medications for this visit.    Allergies:   Morphine and related   Social History:  The patient  reports that he has never smoked. He has never used smokeless tobacco. He reports that he does  not drink alcohol and does not use drugs.   Family History:  The patient's family history includes Diabetes in his brother and mother; Hypertension in his brother.   Review of systems complete and found to be negative unless listed in HPI.    PHYSICAL EXAM: VS:  Ht _0  (1.676 m)   Wt 257 lb 9.6 oz (116.8 kg)   SpO2 94%   BMI 41.58 kg/m  , BMI Body mass index is 41.58 kg/m.  General: Pleasant, NAD. No resp difficulty Psych: Normal affect. HEENT:  Normal, without mass or lesion.         Neck: Supple, no  bruits or JVD. Carotids 2+. No lymphadenopathy/thyromegaly appreciated. Heart: PMI nondisplaced. RRR no s3, s4, or murmurs. Lungs:  Resp regular and unlabored, CTA. Abdomen: Soft, non-tender, non-distended, No HSM, BS + x 4.   Extremities: No clubbing, cyanosis or edema. DP/PT/Radials 2+ and equal bilaterally. Neuro: Alert and oriented X 3. Moves all extremities spontaneously.   EKG:  EKG is ordered today. Personal review of todays EKG shows course AF vs flutter at 68 bpm Personal review of the ekg ordered 09/27/22 shows atrial fibrillation   Recent Labs: 10/24/2022: ALT 13; BUN 17; Creatinine, Ser 0.98; Potassium 4.2; Sodium 141    Lipid Panel     Component Value Date/Time   CHOL 128 11/24/2021 0000   CHOL 117 10/21/2020 1152   TRIG 131 11/24/2021 0000   HDL 47 11/24/2021 0000   HDL 44 10/21/2020 1152   CHOLHDL 2.7 10/21/2020 1152   CHOLHDL 3.6 06/26/2007 1510   VLDL 25 06/26/2007 1510   LDLCALC 82 11/24/2021 0000   LDLCALC 59 10/21/2020 1152     Wt Readings from Last 3 Encounters:  11/16/22 257 lb 9.6 oz (116.8 kg)  11/07/22 255 lb (115.7 kg)  10/24/22 253 lb (114.8 kg)      Other studies Reviewed: Additional studies/ records that were reviewed today include: TTE 04/21/21  Review of the above records today demonstrates:   1. Left ventricular ejection fraction, by estimation, is 55 to 60%. The  left ventricle has normal function. The left ventricle has no  regional  wall motion abnormalities. There is mild left ventricular hypertrophy.  Left ventricular diastolic parameters  were normal.   2. Right ventricular systolic function is normal. The right ventricular  size is normal.   3. The mitral valve is grossly normal. Trivial mitral valve  regurgitation.   4. The aortic valve is tricuspid. Aortic valve regurgitation is not  visualized.   5. The inferior vena cava is normal in size with greater than 50%  respiratory variability, suggesting right atrial pressure of 3 mmHg.    ASSESSMENT AND PLAN:  1.  Persistent atrial fibrillation: CHA2DS2-VASc of 3.   Continue coumadin  Remains symptomatic with SOB, weakness, fatigue.  Plan [facility].  Not currently interested in pursuing ablation  INR 3.3 today by check by pharmacist.   2.  Coronary artery disease: Status post CABG x4.  Denies s/s ischemia.   3.  HTN Stable on current regimen   4. HLD Continue statin   Current medicines are reviewed at length with the patient today.   The patient does not have concerns regarding his medicines.  The following changes were made today:  none  Labs/ tests ordered today include:  Orders Placed This Encounter  Procedures   Comprehensive metabolic panel   TSH   T4, free   EKG 12-Lead   Disposition:   FU with Dr. Curt Bears 4-6 weeks post [facility] to discuss other options if unsuccessful.   Jacalyn Lefevre, PA-C  11/16/2022 11:18 AM     [facility] [address] [place] [address] [phone] (office) [phone] (fax)

## 2022-11-14 ENCOUNTER — Telehealth: Payer: Self-pay | Admitting: Cardiology

## 2022-11-14 ENCOUNTER — Other Ambulatory Visit: Payer: Self-pay | Admitting: Cardiology

## 2022-11-14 MED ORDER — AMIODARONE HCL 200 MG PO TABS: 200.0000 mg | ORAL_TABLET | Freq: Every day | ORAL | 6 refills | Status: DC

## 2022-11-14 NOTE — Telephone Encounter (Signed)
Pt needs another Rx sent in if he is to continue taking Amiodarone. Pt advised Rx sent in for 30 days w/ refills.  Pt will let us know if he wants to change it to 90 days after OV. He appreciates my call.

## 2022-11-14 NOTE — Telephone Encounter (Signed)
Rx refill sent to pharmacy. 

## 2022-11-14 NOTE — Telephone Encounter (Signed)
  Pt c/o medication issue:  1. Name of Medication: amiodarone (PACERONE) 200 MG tablet   2. How are you currently taking this medication (dosage and times per day)? As written  3. Are you having a reaction (difficulty breathing--STAT)? no  4. What is your medication issue? Pt would like to know if he needs to continue taking this medication

## 2022-11-16 ENCOUNTER — Ambulatory Visit: Payer: Medicare Other | Attending: Student | Admitting: Student

## 2022-11-16 VITALS — Ht 66.0 in | Wt 257.6 lb

## 2022-11-16 DIAGNOSIS — I2510 Atherosclerotic heart disease of native coronary artery without angina pectoris: Secondary | ICD-10-CM

## 2022-11-16 DIAGNOSIS — G4733 Obstructive sleep apnea (adult) (pediatric): Secondary | ICD-10-CM | POA: Diagnosis not present

## 2022-11-16 DIAGNOSIS — Z79899 Other long term (current) drug therapy: Secondary | ICD-10-CM | POA: Insufficient documentation

## 2022-11-16 DIAGNOSIS — I10 Essential (primary) hypertension: Secondary | ICD-10-CM | POA: Insufficient documentation

## 2022-11-16 DIAGNOSIS — I4819 Other persistent atrial fibrillation: Secondary | ICD-10-CM | POA: Insufficient documentation

## 2022-11-16 NOTE — Patient Instructions (Signed)
Medication Instructions:  Your physician recommends that you continue on your current medications as directed. Please refer to the Current Medication list given to you today.  *If you need a refill on your cardiac medications before your next appointment, please call your pharmacy*   Lab Work: None If you have labs (blood work) drawn today and your tests are completely normal, you will receive your results only by: [place] (if you have MyChart) OR A paper copy in the mail If you have any lab test that is abnormal or we need to change your treatment, we will call you to review the results.   Follow-Up: At [facility], you and your health needs are our priority.  As part of our continuing mission to provide you with exceptional heart care, we have created designated Provider Care Teams.  These Care Teams include your primary Cardiologist (physician) and Advanced Practice Providers (APPs -  Physician Assistants and Nurse Practitioners) who all work together to provide you with the care you need, when you need it.   Your next appointment:   4-6 week(s)  The format for your next appointment:   In Person  Provider:   Allegra Lai, MD    Important Information About Sugar

## 2022-11-17 ENCOUNTER — Ambulatory Visit (HOSPITAL_COMMUNITY)
Admission: RE | Admit: 2022-11-17 | Discharge: 2022-11-17 | Disposition: A | Discharge status: Home / Self Care | Payer: Medicare Other | Attending: Cardiology | Admitting: Cardiology

## 2022-11-17 ENCOUNTER — Encounter (HOSPITAL_COMMUNITY)
Admission: RE | Disposition: A | Discharge status: Home / Self Care | Payer: Self-pay | Source: Home / Self Care | Attending: Cardiology

## 2022-11-17 ENCOUNTER — Ambulatory Visit (HOSPITAL_COMMUNITY): Payer: Medicare Other | Admitting: Anesthesiology

## 2022-11-17 ENCOUNTER — Encounter (HOSPITAL_COMMUNITY): Payer: Self-pay | Admitting: Cardiology

## 2022-11-17 ENCOUNTER — Ambulatory Visit (HOSPITAL_BASED_OUTPATIENT_CLINIC_OR_DEPARTMENT_OTHER): Payer: Medicare Other | Admitting: Anesthesiology

## 2022-11-17 DIAGNOSIS — Z86718 Personal history of other venous thrombosis and embolism: Secondary | ICD-10-CM | POA: Insufficient documentation

## 2022-11-17 DIAGNOSIS — I110 Hypertensive heart disease with heart failure: Secondary | ICD-10-CM

## 2022-11-17 DIAGNOSIS — E785 Hyperlipidemia, unspecified: Secondary | ICD-10-CM | POA: Diagnosis not present

## 2022-11-17 DIAGNOSIS — Z833 Family history of diabetes mellitus: Secondary | ICD-10-CM | POA: Diagnosis not present

## 2022-11-17 DIAGNOSIS — Z7984 Long term (current) use of oral hypoglycemic drugs: Secondary | ICD-10-CM | POA: Insufficient documentation

## 2022-11-17 DIAGNOSIS — I509 Heart failure, unspecified: Secondary | ICD-10-CM | POA: Insufficient documentation

## 2022-11-17 DIAGNOSIS — R7303 Prediabetes: Secondary | ICD-10-CM | POA: Insufficient documentation

## 2022-11-17 DIAGNOSIS — Z951 Presence of aortocoronary bypass graft: Secondary | ICD-10-CM | POA: Diagnosis not present

## 2022-11-17 DIAGNOSIS — Z9989 Dependence on other enabling machines and devices: Secondary | ICD-10-CM | POA: Diagnosis not present

## 2022-11-17 DIAGNOSIS — I2510 Atherosclerotic heart disease of native coronary artery without angina pectoris: Secondary | ICD-10-CM | POA: Insufficient documentation

## 2022-11-17 DIAGNOSIS — E119 Type 2 diabetes mellitus without complications: Secondary | ICD-10-CM | POA: Diagnosis not present

## 2022-11-17 DIAGNOSIS — G4730 Sleep apnea, unspecified: Secondary | ICD-10-CM | POA: Diagnosis not present

## 2022-11-17 DIAGNOSIS — I480 Paroxysmal atrial fibrillation: Secondary | ICD-10-CM

## 2022-11-17 DIAGNOSIS — I4819 Other persistent atrial fibrillation: Secondary | ICD-10-CM

## 2022-11-17 DIAGNOSIS — Z8249 Family history of ischemic heart disease and other diseases of the circulatory system: Secondary | ICD-10-CM | POA: Insufficient documentation

## 2022-11-17 DIAGNOSIS — G4733 Obstructive sleep apnea (adult) (pediatric): Secondary | ICD-10-CM | POA: Diagnosis not present

## 2022-11-17 DIAGNOSIS — Z6841 Body Mass Index (BMI) 40.0 and over, adult: Secondary | ICD-10-CM | POA: Diagnosis not present

## 2022-11-17 DIAGNOSIS — K219 Gastro-esophageal reflux disease without esophagitis: Secondary | ICD-10-CM | POA: Diagnosis not present

## 2022-11-17 DIAGNOSIS — Z7901 Long term (current) use of anticoagulants: Secondary | ICD-10-CM | POA: Insufficient documentation

## 2022-11-17 DIAGNOSIS — I4891 Unspecified atrial fibrillation: Secondary | ICD-10-CM | POA: Diagnosis not present

## 2022-11-17 HISTORY — PX: CARDIOVERSION: SHX1299

## 2022-11-17 LAB — POCT I-STAT, CHEM 8
BUN: 19 mg/dL (ref 8–23)
Calcium, Ion: 1.15 mmol/L (ref 1.15–1.40)
Chloride: 97 mmol/L — ABNORMAL LOW (ref 98–111)
Creatinine, Ser: 0.9 mg/dL (ref 0.61–1.24)
Glucose, Bld: 130 mg/dL — ABNORMAL HIGH (ref 70–99)
HCT: 36 % — ABNORMAL LOW (ref 39.0–52.0)
Hemoglobin: 12.2 g/dL — ABNORMAL LOW (ref 13.0–17.0)
Potassium: 3.6 mmol/L (ref 3.5–5.1)
Sodium: 138 mmol/L (ref 135–145)
TCO2: 27 mmol/L (ref 22–32)

## 2022-11-17 LAB — PROTIME-INR
INR: 3.2 — ABNORMAL HIGH (ref 0.8–1.2)
Prothrombin Time: 32.7 seconds — ABNORMAL HIGH (ref 11.4–15.2)

## 2022-11-17 SURGERY — CARDIOVERSION
Anesthesia: General

## 2022-11-17 MED ORDER — SODIUM CHLORIDE 0.9 % IV SOLN: INTRAVENOUS | Status: DC

## 2022-11-17 MED ORDER — LIDOCAINE 2% (20 MG/ML) 5 ML SYRINGE
INTRAMUSCULAR | Status: DC | PRN
  Administered 2022-11-17: 60 mg via INTRAVENOUS

## 2022-11-17 MED ORDER — PROPOFOL 10 MG/ML IV BOLUS
INTRAVENOUS | Status: DC | PRN
  Administered 2022-11-17: 70 mg via INTRAVENOUS

## 2022-11-17 NOTE — Interval H&P Note (Signed)
History and Physical Interval Note:  11/17/2022 10:20 AM  Eduardo Butler  has presented today for surgery, with the diagnosis of AFIB.  The various methods of treatment have been discussed with the patient and family. After consideration of risks, benefits and other options for treatment, the patient has consented to  Procedure(s): CARDIOVERSION (N/A) as a surgical intervention.  The patient's history has been reviewed, patient examined, no change in status, stable for surgery.  I have reviewed the patient's chart and labs.  Questions were answered to the patient's satisfaction.     [name]

## 2022-11-17 NOTE — Interval H&P Note (Signed)
History and Physical Interval Note:  11/17/2022 10:08 AM  Eduardo Butler  has presented today for surgery, with the diagnosis of AFIB.  The various methods of treatment have been discussed with the patient and family. After consideration of risks, benefits and other options for treatment, the patient has consented to  Procedure(s): CARDIOVERSION (N/A) as a surgical intervention.  The patient's history has been reviewed, patient examined, no change in status, stable for surgery.  I have reviewed the patient's chart and labs.  Questions were answered to the patient's satisfaction.     [name]

## 2022-11-17 NOTE — Anesthesia Preprocedure Evaluation (Addendum)
Anesthesia Evaluation  Patient identified by MRN, date of birth, ID band Patient awake    Reviewed: Allergy & Precautions, NPO status , Patient's Chart, lab work & pertinent test results, reviewed documented beta blocker date and time   History of Anesthesia Complications Negative for: history of anesthetic complications  Airway Mallampati: II  TM Distance: >3 FB Neck ROM: Full    Dental  (+) Dental Advisory Given   Pulmonary sleep apnea and Continuous Positive Airway Pressure Ventilation    Pulmonary exam normal        Cardiovascular hypertension, Pt. on home beta blockers and Pt. on medications + CAD, + CABG, +CHF and + DVT  + dysrhythmias Atrial Fibrillation  Rhythm:Irregular Rate:Normal     Neuro/Psych  PSYCHIATRIC DISORDERS Anxiety     negative neurological ROS     GI/Hepatic Neg liver ROS,GERD  Medicated and Controlled,,  Endo/Other  diabetes, Type 2, Oral Hypoglycemic Agents  Morbid obesity Pre-DM   Renal/GU negative Renal ROS     Musculoskeletal  (+) Arthritis ,    Abdominal  (+) + obese  Peds  Hematology  On coumadin    Anesthesia Other Findings   Reproductive/Obstetrics                             Anesthesia Physical Anesthesia Plan  ASA: 3  Anesthesia Plan: General   Post-op Pain Management: Minimal or no pain anticipated   Induction: Intravenous  PONV Risk Score and Plan: 2 and Treatment may vary due to age or medical condition and Propofol infusion  Airway Management Planned: Natural Airway and Mask  Additional Equipment: None  Intra-op Plan:   Post-operative Plan:   Informed Consent: I have reviewed the patients History and Physical, chart, labs and discussed the procedure including the risks, benefits and alternatives for the proposed anesthesia with the patient or authorized representative who has indicated his/her understanding and acceptance.        Plan Discussed with: CRNA and Anesthesiologist  Anesthesia Plan Comments:        Anesthesia Quick Evaluation

## 2022-11-17 NOTE — Discharge Instructions (Signed)

## 2022-11-17 NOTE — CV Procedure (Addendum)
Procedure:   DCCV  Indication:  Symptomatic atrial fibrillation  Procedure Note:  The patient signed informed consent.  They have had had therapeutic anticoagulation with warfarin greater than 3 weeks (INR>2 on 11/16, 11/22, 11/29, 12/14, 12/21).  Anesthesia was administered by Dr. Fransisco Beau and Rejeana Brock, CRNA.  Adequate airway was maintained throughout and vital followed per protocol.  They were cardioverted x 1 with 200J of biphasic synchronized energy.  They converted to NSR with rate 50-60s.  There were no apparent complications.  The patient had normal neuro status and respiratory status post procedure with vitals stable as recorded elsewhere.    Follow up:  They will continue on current medical therapy and follow up with cardiology as scheduled.  Oswaldo Milian, MD 11/17/2022 11:05 AM

## 2022-11-17 NOTE — Anesthesia Postprocedure Evaluation (Signed)
Anesthesia Post Note  Patient: Eduardo Butler  Procedure(s) Performed: CARDIOVERSION     Patient location during evaluation: PACU Anesthesia Type: General Level of consciousness: awake and alert Pain management: pain level controlled Vital Signs Assessment: post-procedure vital signs reviewed and stable Respiratory status: spontaneous breathing, nonlabored ventilation and respiratory function stable Cardiovascular status: stable and blood pressure returned to baseline Anesthetic complications: no   No notable events documented.  Last Vitals:  Vitals:   11/17/22 1118 11/17/22 1128  BP: (!) 104/57 110/72  Pulse: 64 64  Resp: 18 19  Temp:    SpO2: 95% 97%    Last Pain:  Vitals:   11/17/22 1128  TempSrc:   PainSc: 0-No pain                 Audry Pili

## 2022-11-17 NOTE — Transfer of Care (Signed)
Immediate Anesthesia Transfer of Care Note  Patient: Eduardo Butler  Procedure(s) Performed: CARDIOVERSION  Patient Location: Endoscopy Unit  Anesthesia Type:General  Level of Consciousness: awake and alert   Airway & Oxygen Therapy: Patient Spontanous Breathing  Post-op Assessment: Report given to RN and Post -op Vital signs reviewed and stable  Post vital signs: Reviewed and stable  Last Vitals:  Vitals Value Taken Time  BP    Temp    Pulse    Resp    SpO2      Last Pain:  Vitals:   11/17/22 0948  TempSrc: Tympanic  PainSc: 0-No pain         Complications: No notable events documented.

## 2022-11-21 ENCOUNTER — Encounter (HOSPITAL_COMMUNITY): Payer: Self-pay | Admitting: Cardiology

## 2022-11-22 ENCOUNTER — Encounter: Payer: Self-pay | Admitting: Bariatrics

## 2022-11-22 NOTE — Progress Notes (Signed)
Chief Complaint:   OBESITY Eduardo Butler is here to discuss his progress with his obesity treatment plan along with follow-up of his obesity related diagnoses. Eduardo Butler is on the Category 3 Plan and states he is following his eating plan approximately 50% of the time. Eduardo Butler states he is doing 0 minutes 0 times per week.  Today's visit was #: 2 Starting weight: 253 lbs Starting date: 10/24/2022 Today's weight: 255 lbs Today's date: 11/07/2022 Total lbs lost to date: 0 Total lbs lost since last in-office visit: 0  Interim History: Eduardo Butler has went up 2 pounds since his last visit.  He has been taking prednisone since his last week due to his upper respiratory infection.  He is up about 2 pounds in water, possibly due to the prednisone.  Subjective:   1. Vitamin D insufficiency Eduardo Butler is currently taking multivitamins.  2. Type 2 diabetes mellitus with other specified complication, without long-term current use of insulin (HCC) Eduardo Butler is taking metformin.  He notes his appetite is normal, and his last A1c was 7.2 on 07/13/2022.  3. Mixed hyperlipidemia Eduardo Butler is taking Zetia and Crestor.  Assessment/Plan:   1. Vitamin D insufficiency Eduardo Butler agreed to start prescription Vitamin D 50,000 IU every week with no refills.  - Vitamin D, Ergocalciferol, (DRISDOL) 1.25 MG (50000 UNIT) CAPS capsule; Take 1 capsule (50,000 Units total) by mouth every 7 (seven) days.  Dispense: 5 capsule; Refill: 0  2. Type 2 diabetes mellitus with other specified complication, without long-term current use of insulin (HCC) Eduardo Butler will continue metformin.  We discussed GLP-1, benefits, and risks.   3. Mixed hyperlipidemia Eduardo Butler will continue his medications.  He will work on eliminating trans fats and minimizing saturated fats.  4. Obesity, Current BMI 43.8 Eduardo Butler is currently in the action stage of change. As such, his goal is to continue with weight loss efforts. He has agreed to the Category 3 Plan.   Meal planning was  discussed.  He will adhere closely to the plan.  Keep protein and water intake high.  Exercise goals: No exercise has been prescribed at this time.  Behavioral modification strategies: increasing lean protein intake, decreasing simple carbohydrates, increasing vegetables, increasing water intake, decreasing eating out, no skipping meals, meal planning and cooking strategies, keeping healthy foods in the home, and planning for success.  Eduardo Butler has agreed to follow-up with our clinic in 2 to 3 weeks with Eduardo Pacific, FNP-C. He was informed of the importance of frequent follow-up visits to maximize his success with intensive lifestyle modifications for his multiple health conditions.   Objective:   Blood pressure 137/68, pulse 72, temperature 97.9 F (36.6 C), height '5\' 4"'$  (1.626 m), weight 255 lb (115.7 kg), SpO2 95 %. Body mass index is 43.77 kg/m.  General: Cooperative, alert, well developed, in no acute distress. HEENT: Conjunctivae and lids unremarkable. Cardiovascular: Regular rhythm.  Lungs: Normal work of breathing. Neurologic: No focal deficits.   Lab Results  Component Value Date   CREATININE 0.90 11/17/2022   BUN 19 11/17/2022   NA 138 11/17/2022   K 3.6 11/17/2022   CL 97 (L) 11/17/2022   CO2 27 10/24/2022   Lab Results  Component Value Date   ALT 13 10/24/2022   AST 18 10/24/2022   ALKPHOS 64 10/24/2022   BILITOT 0.4 10/24/2022   No results found for: "HGBA1C" Lab Results  Component Value Date   INSULIN 8.2 10/24/2022    Lab Results  Component Value Date  CHOL 128 11/24/2021   HDL 47 11/24/2021   LDLCALC 82 11/24/2021   TRIG 131 11/24/2021   CHOLHDL 2.7 10/21/2020   Lab Results  Component Value Date   VD25OH 33.8 10/24/2022   Lab Results  Component Value Date   WBC 8.5 04/15/2020   HGB 12.2 (L) 11/17/2022   HCT 36.0 (L) 11/17/2022   MCV 86 04/15/2020   PLT 278 04/15/2020   No results found for: "IRON", "TIBC", "FERRITIN"  Attestation  Statements:   Reviewed by clinician on day of visit: allergies, medications, problem list, medical history, surgical history, family history, social history, and previous encounter notes.   Wilhemena Durie, am acting as Location manager for [employer], DO.  I have reviewed the above documentation for accuracy and completeness, and I agree with the above. Jearld Lesch, DO

## 2022-11-23 ENCOUNTER — Telehealth (INDEPENDENT_AMBULATORY_CARE_PROVIDER_SITE_OTHER): Payer: Self-pay | Admitting: Bariatrics

## 2022-11-23 ENCOUNTER — Encounter (INDEPENDENT_AMBULATORY_CARE_PROVIDER_SITE_OTHER): Payer: Self-pay | Admitting: Bariatrics

## 2022-11-23 NOTE — Telephone Encounter (Signed)
Patient stated she misplaced his Vitamin D pills. The pharmacy stated he picked it up. He wanted a CMA to call him to to see if he can get the meds refill today but I told him to wait until tomorrow to refill the meds.

## 2022-11-23 NOTE — Telephone Encounter (Signed)
Phone call to patient to let him know that his insurance wont pay for a new prescription so he will get some OTC vitamin D and take it until his next OV.

## 2022-11-24 ENCOUNTER — Encounter: Payer: Self-pay | Admitting: Nurse Practitioner

## 2022-11-24 ENCOUNTER — Ambulatory Visit (INDEPENDENT_AMBULATORY_CARE_PROVIDER_SITE_OTHER): Payer: Medicare Other | Admitting: Nurse Practitioner

## 2022-11-24 VITALS — BP 111/67 | HR 67 | Temp 98.6°F | Ht 66.0 in | Wt 254.0 lb

## 2022-11-24 DIAGNOSIS — I480 Paroxysmal atrial fibrillation: Secondary | ICD-10-CM

## 2022-11-24 DIAGNOSIS — E669 Obesity, unspecified: Secondary | ICD-10-CM | POA: Diagnosis not present

## 2022-11-24 DIAGNOSIS — Z6841 Body Mass Index (BMI) 40.0 and over, adult: Secondary | ICD-10-CM | POA: Diagnosis not present

## 2022-11-25 DIAGNOSIS — Z7901 Long term (current) use of anticoagulants: Secondary | ICD-10-CM | POA: Diagnosis not present

## 2022-11-25 DIAGNOSIS — I4891 Unspecified atrial fibrillation: Secondary | ICD-10-CM | POA: Diagnosis not present

## 2022-11-29 ENCOUNTER — Encounter (INDEPENDENT_AMBULATORY_CARE_PROVIDER_SITE_OTHER): Payer: Self-pay | Admitting: Bariatrics

## 2022-11-29 DIAGNOSIS — Z7901 Long term (current) use of anticoagulants: Secondary | ICD-10-CM | POA: Diagnosis not present

## 2022-11-29 DIAGNOSIS — I4891 Unspecified atrial fibrillation: Secondary | ICD-10-CM | POA: Diagnosis not present

## 2022-12-08 ENCOUNTER — Encounter: Payer: Self-pay | Admitting: Bariatrics

## 2022-12-08 ENCOUNTER — Ambulatory Visit (INDEPENDENT_AMBULATORY_CARE_PROVIDER_SITE_OTHER): Payer: PPO | Admitting: Bariatrics

## 2022-12-08 VITALS — BP 124/59 | HR 58 | Ht 66.0 in | Wt 254.0 lb

## 2022-12-08 DIAGNOSIS — E119 Type 2 diabetes mellitus without complications: Secondary | ICD-10-CM | POA: Insufficient documentation

## 2022-12-08 DIAGNOSIS — E1169 Type 2 diabetes mellitus with other specified complication: Secondary | ICD-10-CM | POA: Diagnosis not present

## 2022-12-08 DIAGNOSIS — Z7985 Long-term (current) use of injectable non-insulin antidiabetic drugs: Secondary | ICD-10-CM | POA: Diagnosis not present

## 2022-12-08 DIAGNOSIS — E669 Obesity, unspecified: Secondary | ICD-10-CM

## 2022-12-08 DIAGNOSIS — Z7984 Long term (current) use of oral hypoglycemic drugs: Secondary | ICD-10-CM | POA: Diagnosis not present

## 2022-12-08 DIAGNOSIS — Z6841 Body Mass Index (BMI) 40.0 and over, adult: Secondary | ICD-10-CM

## 2022-12-08 DIAGNOSIS — E559 Vitamin D deficiency, unspecified: Secondary | ICD-10-CM | POA: Diagnosis not present

## 2022-12-08 MED ORDER — VITAMIN D (ERGOCALCIFEROL) 1.25 MG (50000 UNIT) PO CAPS: 50000.0000 [IU] | ORAL_CAPSULE | ORAL | 0 refills | Status: DC

## 2022-12-08 MED ORDER — OZEMPIC (0.25 OR 0.5 MG/DOSE) 2 MG/3ML ~~LOC~~ SOPN: 0.2500 mg | PEN_INJECTOR | SUBCUTANEOUS | 0 refills | Status: DC

## 2022-12-09 DIAGNOSIS — G4733 Obstructive sleep apnea (adult) (pediatric): Secondary | ICD-10-CM | POA: Diagnosis not present

## 2022-12-09 NOTE — Progress Notes (Unsigned)
Chief Complaint:   OBESITY Eduardo Butler is here to discuss his progress with his obesity treatment plan along with follow-up of his obesity related diagnoses. Eduardo Butler is on the Category 3 Plan and states he is following his eating plan approximately 50% of the time. Eduardo Butler states he is exercising 0 minutes 0 times per week.  Today's visit was #: 3 Starting weight: 253 lbs Starting date: 10/24/2022 Today's weight: 254 lbs Today's date: 11/24/2022 Total lbs lost to date: 0 lbs Total lbs lost since last in-office visit: 0  Interim History: Eduardo Butler was surprised he lost weight due to celebrating the holidays. He has gotten off track and is planning to start following meal plan now. Struggles, with eating more carbs. Drinks water and 1 diet soda daily.   Subjective:   1. Paroxysmal atrial fibrillation ([place]) Eduardo Butler saw cardiology last on 11/16/22. Denies chest pain/palpitations. Doing well.  Discontinue caffeine intake.  Assessment/Plan:   1. Paroxysmal atrial fibrillation (HCC) Keep follow up appointment with cardio and go to ER if needed.  2. Obesity, Current BMI 43.7 Eduardo Butler is currently in the action stage of change. As such, his goal is to continue with weight loss efforts. He has agreed to the Category 3 Plan.   Exercise goals: Older adults should follow the adult guidelines. When older adults cannot meet the adult guidelines, they should be as physically active as their abilities and conditions will allow.   Exercise:  Per cardiology recommendations.   Avoid Phentermine, Qsymia and Contrave due to cardiac history.  Behavioral modification strategies: increasing lean protein intake, increasing water intake, and meal planning and cooking strategies.  Eduardo Butler has agreed to follow-up with our clinic in 2 weeks. He was informed of the importance of frequent follow-up visits to maximize his success with intensive lifestyle modifications for his multiple health conditions.   Objective:   Blood  pressure 111/67, pulse 67, temperature 98.6 F (37 C), temperature source Oral, height '5\' 6"'$  (1.676 m), weight 254 lb (115.2 kg), SpO2 97 %. Body mass index is 41 kg/m.  General: Cooperative, alert, well developed, in no acute distress. HEENT: Conjunctivae and lids unremarkable. Cardiovascular: Regular rhythm.  Lungs: Normal work of breathing. Neurologic: No focal deficits.   Lab Results  Component Value Date   CREATININE 0.90 11/17/2022   BUN 19 11/17/2022   NA 138 11/17/2022   K 3.6 11/17/2022   CL 97 (L) 11/17/2022   CO2 27 10/24/2022   Lab Results  Component Value Date   ALT 13 10/24/2022   AST 18 10/24/2022   ALKPHOS 64 10/24/2022   BILITOT 0.4 10/24/2022   No results found for: "HGBA1C" Lab Results  Component Value Date   INSULIN 8.2 10/24/2022    Lab Results  Component Value Date   CHOL 128 11/24/2021   HDL 47 11/24/2021   LDLCALC 82 11/24/2021   TRIG 131 11/24/2021   CHOLHDL 2.7 10/21/2020   Lab Results  Component Value Date   VD25OH 33.8 10/24/2022   Lab Results  Component Value Date   WBC 8.5 04/15/2020   HGB 12.2 (L) 11/17/2022   HCT 36.0 (L) 11/17/2022   MCV 86 04/15/2020   PLT 278 04/15/2020   No results found for: "IRON", "TIBC", "FERRITIN"  Attestation Statements:   Reviewed by clinician on day of visit: allergies, medications, problem list, medical history, surgical history, family history, social history, and previous encounter notes.  Time spent on visit including pre-visit chart review and post-visit care and charting  was 30 minutes.   I, Brendell Tyus, RMA, am acting as transcriptionist for [name], FNP.  I have reviewed the above documentation for accuracy and completeness, and I agree with the above. [name], FNP

## 2022-12-13 ENCOUNTER — Ambulatory Visit: Payer: No Typology Code available for payment source | Attending: Cardiology | Admitting: Cardiology

## 2022-12-13 DIAGNOSIS — Z7901 Long term (current) use of anticoagulants: Secondary | ICD-10-CM | POA: Diagnosis not present

## 2022-12-13 DIAGNOSIS — I4891 Unspecified atrial fibrillation: Secondary | ICD-10-CM | POA: Diagnosis not present

## 2022-12-13 NOTE — Progress Notes (Deleted)
Electrophysiology Office Note   Date:  12/13/2022   ID:  Karar, Wilker [birth year], MRN [id number]  PCP:  Orpah Melter, MD  Cardiologist:  Gwenlyn Found Primary Electrophysiologist:  Lance Galas Meredith Leeds, MD    Chief Complaint: AF   History of Present Illness: Eduardo Butler is a 72 y.o. male who is being seen today for the evaluation of AF at the request of Orpah Melter, MD. Presenting today for electrophysiology evaluation.  He has a history stated for coronary artery disease post CABG x 4, PE due to lupus anticoagulant on long-term Coumadin, hyperlipidemia, atrial fibrillation.  He had been having more frequent episodes of atrial fibrillation.  He has since been started on amiodarone.  Today, denies symptoms of palpitations, chest pain, shortness of breath, orthopnea, PND, lower extremity edema, claudication, dizziness, presyncope, syncope, bleeding, or neurologic sequela. The patient is tolerating medications without difficulties. ***    Past Medical History:  Diagnosis Date   Anxiety    Arthritis    CHF (congestive heart failure) ([place])    Coronary artery disease    Diabetes ([place])    H/O cardiac catheterization 08/25/2006   normal cath-patent grafts   H/O Doppler ultrasound 02/21/2011   lower ext.venous doppler,, no treatment except support stockings are recommended if clinically indicated   H/O echocardiogram 12/02/2010   EF 55% , no significant abnormalities   History of stress test 01/04/2013   Lexiscan Myoview ; scattered PVC's ; LV EF 54% ; Normal stress test    Hyperlipidemia    Hypertension    Obesity    Peroneal DVT (deep venous thrombosis) (HCC)    Shortness of breath    Past Surgical History:  Procedure Laterality Date   BYPASS GRAFT  2003   4 bypass    CARDIAC CATHETERIZATION  February 10, 2002   S/P sudden cardiac death; three vessel coronary artery disease; preserved overall left ventricular systolic function with loculated mild to moderate anterolateral  hypokinesis; no mitral regurgitation noted   CARDIOVERSION N/A 11/17/2022   Procedure: CARDIOVERSION;  Surgeon: [name], MD;  Location: [facility];  Service: Cardiovascular;  Laterality: N/A;   CORONARY ARTERY BYPASS GRAFT  2003   HERNIA REPAIR     LEFT HEART CATH AND CORS/GRAFTS ANGIOGRAPHY N/A 04/23/2020   Procedure: LEFT HEART CATH AND CORS/GRAFTS ANGIOGRAPHY;  Surgeon: Lorretta Harp, MD;  Location: [facility];  Service: Cardiovascular;  Laterality: N/A;   LUMBAR LAMINECTOMY/ DECOMPRESSION WITH MET-RX Left 07/08/2014   Procedure: Left Lumbar Four-Five Metrex foraminal microdiskectomy;  Surgeon: Consuella Lose, MD;  Location: [facility];  Service: Neurosurgery;  Laterality: Left;  Left Lumbar Four-Five Metrex foraminal microdiskectomy   PELVIC LAPAROSCOPY  2005   SHOULDER SURGERY     UMBILICAL HERNIA REPAIR  11/17/2011   Procedure: HERNIA REPAIR UMBILICAL ADULT;  Surgeon: Harl Bowie, MD;  Location: [facility];  Service: General;  Laterality: N/A;  umbilical hernia repair with mesh     Current Outpatient Medications  Medication Sig Dispense Refill   acetaminophen (TYLENOL) 500 MG tablet Take 1,000-1,500 mg by mouth every 6 (six) hours as needed for moderate pain.     albuterol (VENTOLIN HFA) 108 (90 Base) MCG/ACT inhaler Inhale 2 puffs into the lungs every 6 (six) hours as needed for shortness of breath.      ALPRAZolam (XANAX) 0.5 MG tablet Take 0.25 mg by mouth at bedtime.     amiodarone (PACERONE) 200 MG tablet Take 1 tablet (200 mg  total) by mouth daily. 30 tablet 6   ammonium lactate (LAC-HYDRIN) 12 % lotion Apply 1 application  topically daily.     aspirin EC 81 MG tablet Take 81 mg by mouth daily.     carvedilol (COREG) 3.125 MG tablet TAKE 1 TABLET(3.125 MG) BY MOUTH TWICE DAILY (Patient taking differently: Take 3.125 mg by mouth 2 (two) times daily with a meal.) 180 tablet 3   cetirizine (ZYRTEC) 10 MG tablet Take 1 tablet (10  mg total) by mouth once as needed for up to 1 dose for allergies or rhinitis. (Patient taking differently: Take 10 mg by mouth daily as needed for allergies or rhinitis.) 5 tablet 0   Elastic Bandages & Supports (MEDICAL COMPRESSION STOCKINGS) MISC Knee High 20-30 mm compression stockings 2 each 0   ezetimibe (ZETIA) 10 MG tablet Take 1 tablet (10 mg total) by mouth daily. 100 tablet 3   fluticasone (FLONASE) 50 MCG/ACT nasal spray Place 1 spray into both nostrils daily as needed for allergies or rhinitis.     folic acid (FOLVITE) 1 MG tablet Take 1 mg by mouth daily.     furosemide (LASIX) 80 MG tablet TAKE 1 TABLET(80 MG) BY MOUTH DAILY 90 tablet 2   losartan (COZAAR) 25 MG tablet TAKE 1 TABLET(25 MG) BY MOUTH DAILY 90 tablet 3   metFORMIN (GLUCOPHAGE) 1000 MG tablet Take 1,000 mg by mouth 2 (two) times daily with a meal.     Misc Natural Products (IMMUNE FORMULA PO) Take 1 each by mouth daily. Immune: Vitamin C, D and Zinc. Chew 1 tablets daily.     omeprazole (PRILOSEC) 40 MG capsule TAKE ONE CAPSULE BY MOUTH DAILY AS NEEDED FOR HEARTBURN 90 capsule 3   pioglitazone (ACTOS) 45 MG tablet Take 45 mg by mouth daily.      rosuvastatin (CRESTOR) 20 MG tablet Take 1 tablet (20 mg total) by mouth daily. 30 tablet 6   Semaglutide,0.25 or 0.5MG/DOS, (OZEMPIC, 0.25 OR 0.5 MG/DOSE,) 2 MG/3ML SOPN Inject 0.25 mg into the skin once a week. 3 mL 0   Vitamin D, Ergocalciferol, (DRISDOL) 1.25 MG (50000 UNIT) CAPS capsule Take 1 capsule (50,000 Units total) by mouth every 7 (seven) days. 5 capsule 0   warfarin (COUMADIN) 3 MG tablet Take 3 mg by mouth daily.     warfarin (COUMADIN) 4 MG tablet Take 4 mg by mouth See admin instructions. Take Sunday and Tuesday and Thursday     warfarin (COUMADIN) 5 MG tablet Take 5 mg by mouth See admin instructions. Take on Monday,  Wednesday, Friday and Saturday     No current facility-administered medications for this visit.    Allergies:   Morphine and related   Social  History:  The patient  reports that he has never smoked. He has never used smokeless tobacco. He reports that he does not drink alcohol and does not use drugs.   Family History:  The patient's family history includes Diabetes in his brother and mother; Hypertension in his brother.   ROS:  Please see the history of present illness.   Otherwise, review of systems is positive for none.   All other systems are reviewed and negative.   PHYSICAL EXAM: VS:  There were no vitals taken for this visit. , BMI There is no height or weight on file to calculate BMI. GEN: Well nourished, well developed, in no acute distress  HEENT: normal  Neck: no JVD, carotid bruits, or masses Cardiac: ***RRR; no murmurs, rubs, or  gallops,no edema  Respiratory:  clear to auscultation bilaterally, normal work of breathing GI: soft, nontender, nondistended, + BS MS: no deformity or atrophy  Skin: warm and dry Neuro:  Strength and sensation are intact Psych: euthymic mood, full affect  EKG:  EKG {ACTION; IS/IS [id number] ordered today. Personal review of the ekg ordered *** shows ***    Recent Labs: 10/24/2022: ALT 13 11/17/2022: BUN 19; Creatinine, Ser 0.90; Hemoglobin 12.2; Potassium 3.6; Sodium 138    Lipid Panel     Component Value Date/Time   CHOL 128 11/24/2021 0000   CHOL 117 10/21/2020 1152   TRIG 131 11/24/2021 0000   HDL 47 11/24/2021 0000   HDL 44 10/21/2020 1152   CHOLHDL 2.7 10/21/2020 1152   CHOLHDL 3.6 06/26/2007 1510   VLDL 25 06/26/2007 1510   LDLCALC 82 11/24/2021 0000   LDLCALC 59 10/21/2020 1152     Wt Readings from Last 3 Encounters:  12/08/22 254 lb (115.2 kg)  11/24/22 254 lb (115.2 kg)  11/17/22 250 lb (113.4 kg)      Other studies Reviewed: Additional studies/ records that were reviewed today include: TTE 04/21/21  Review of the above records today demonstrates:   1. Left ventricular ejection fraction, by estimation, is 55 to 60%. The  left ventricle has normal  function. The left ventricle has no regional  wall motion abnormalities. There is mild left ventricular hypertrophy.  Left ventricular diastolic parameters  were normal.   2. Right ventricular systolic function is normal. The right ventricular  size is normal.   3. The mitral valve is grossly normal. Trivial mitral valve  regurgitation.   4. The aortic valve is tricuspid. Aortic valve regurgitation is not  visualized.   5. The inferior vena cava is normal in size with greater than 50%  respiratory variability, suggesting right atrial pressure of 3 mmHg.    ASSESSMENT AND PLAN:  1.  Persistent atrial fibrillation: CHA2DS2-VASc of 3.  Currently on Coumadin, Amiodarone 200 mg daily.***  2.  Coronary artery disease: Status post CABG x 4.  No current chest pain.  Plan per primary cardiology.  3.  Hypertension:***  4.  Hyperlipidemia: Continue simvastatin per primary cardiology  5.  Secondary hypercoagulable state: Currently on Coumadin for atrial fibrillation as above  6.  High risk medication monitoring: Currently on amiodarone for atrial fibrillation as above.  Labs and ECG without abnormality.   Current medicines are reviewed at length with the patient today.   The patient does not have concerns regarding his medicines.  The following changes were made today:  ***  Labs/ tests ordered today include:  No orders of the defined types were placed in this encounter.    Disposition:   FU *** months  Signed, Jontez Redfield Meredith Leeds, MD  12/13/2022 8:00 AM     [address] [place] [address] [phone] (office) [phone] (fax)

## 2022-12-15 DIAGNOSIS — Z7901 Long term (current) use of anticoagulants: Secondary | ICD-10-CM | POA: Diagnosis not present

## 2022-12-15 DIAGNOSIS — I4891 Unspecified atrial fibrillation: Secondary | ICD-10-CM | POA: Diagnosis not present

## 2022-12-20 NOTE — Progress Notes (Unsigned)
Chief Complaint:   OBESITY Eduardo Butler is here to discuss his progress with his obesity treatment plan along with follow-up of his obesity related diagnoses. [name] is on the Category 3 Plan and states he is following his eating plan approximately 50% of the time. [name] states he is not currently exercising.  Today's visit was #: 4 Starting weight: 253 lbs Starting date: 10/24/2022 Today's weight: 254 lbs Today's date: 12/08/2021 Total lbs lost to date: 0 Total lbs lost since last in-office visit: 0  Interim History: [name]'s weight remains the same.  Subjective:   1. Vitamin D insufficiency [name] is taking prescription Vitamin D as directed.  2. Type 2 diabetes mellitus with other specified complication, without long-term current use of insulin (HCC) [name] is taking Metformin and Actos as prescribed.  Assessment/Plan:   1. Vitamin D insufficiency Low Vitamin D level contributes to fatigue and are associated with obesity, breast, and colon cancer. He agrees to continue to take prescription Vitamin D '@50'$ ,000 IU every week and will follow-up for routine testing of Vitamin D, at least 2-3 times per year to avoid over-replacement.  Refill- Vitamin D, Ergocalciferol, (DRISDOL) 1.25 MG (50000 UNIT) CAPS capsule; Take 1 capsule (50,000 Units total) by mouth every 7 (seven) days.  Dispense: 5 capsule; Refill: 0  2. Type 2 diabetes mellitus with other specified complication, without long-term current use of insulin ([name]) Handout for GLP-1 provided.   Start- Semaglutide,0.25 or 0.'5MG'$ /DOS, (OZEMPIC, 0.25 OR 0.5 MG/DOSE,) 2 MG/3ML SOPN; Inject 0.25 mg into the skin once a week.  Dispense: 3 mL; Refill: 0  3. Obesity, Current BMI 41.0 [name] is currently in the action stage of change. As such, his goal is to continue with weight loss efforts. He has agreed to change to the Category 2 Plan.   Meal planning [name] will adhere closely to the plan 80-90% of the time.  Exercise goals:  As  is  Behavioral modification strategies: increasing lean protein intake, decreasing simple carbohydrates, increasing vegetables, increasing water intake, decreasing eating out, no skipping meals, meal planning and cooking strategies, keeping healthy foods in the home, and planning for success.  [name] has agreed to follow-up with our clinic in 2 weeks. He was informed of the importance of frequent follow-up visits to maximize his success with intensive lifestyle modifications for his multiple health conditions.   Objective:   Blood pressure (!) 124/59, pulse (!) 58, height '5\' 6"'$  (1.676 m), weight 254 lb (115.2 kg), SpO2 93 %. Body mass index is 41 kg/m.  General: Cooperative, alert, well developed, in no acute distress. HEENT: Conjunctivae and lids unremarkable. Cardiovascular: Regular rhythm.  Lungs: Normal work of breathing. Neurologic: No focal deficits.   Lab Results  Component Value Date   CREATININE 0.90 11/17/2022   BUN 19 11/17/2022   NA 138 11/17/2022   K 3.6 11/17/2022   CL 97 (L) 11/17/2022   CO2 27 10/24/2022   Lab Results  Component Value Date   ALT 13 10/24/2022   AST 18 10/24/2022   ALKPHOS 64 10/24/2022   BILITOT 0.4 10/24/2022   No results found for: "HGBA1C" Lab Results  Component Value Date   INSULIN 8.2 10/24/2022   Lab Results  Component Value Date   TSH 1.454 ***Test methodology is 3rd generation TSH*** 06/26/2007   Lab Results  Component Value Date   CHOL 128 11/24/2021   HDL 47 11/24/2021   LDLCALC 82 11/24/2021   TRIG 131 11/24/2021   CHOLHDL 2.7 10/21/2020  Lab Results  Component Value Date   VD25OH 33.8 10/24/2022   Lab Results  Component Value Date   WBC 8.5 04/15/2020   HGB 12.2 (L) 11/17/2022   HCT 36.0 (L) 11/17/2022   MCV 86 04/15/2020   PLT 278 04/15/2020    Attestation Statements:   Reviewed by clinician on day of visit: allergies, medications, problem list, medical history, surgical history, family history, social  history, and previous encounter notes.  I, Kathlene November, BS, CMA, am acting as transcriptionist for [employer], DO.  I have reviewed the above documentation for accuracy and completeness, and I agree with the above. -  ***

## 2022-12-21 ENCOUNTER — Encounter: Payer: Self-pay | Admitting: Bariatrics

## 2022-12-22 ENCOUNTER — Ambulatory Visit (INDEPENDENT_AMBULATORY_CARE_PROVIDER_SITE_OTHER): Payer: PPO | Admitting: Bariatrics

## 2022-12-22 ENCOUNTER — Encounter: Payer: Self-pay | Admitting: Bariatrics

## 2022-12-22 VITALS — BP 124/67 | HR 60 | Temp 97.5°F | Ht 66.0 in | Wt 250.0 lb

## 2022-12-22 DIAGNOSIS — E669 Obesity, unspecified: Secondary | ICD-10-CM

## 2022-12-22 DIAGNOSIS — Z6841 Body Mass Index (BMI) 40.0 and over, adult: Secondary | ICD-10-CM | POA: Diagnosis not present

## 2022-12-22 DIAGNOSIS — E1169 Type 2 diabetes mellitus with other specified complication: Secondary | ICD-10-CM | POA: Diagnosis not present

## 2022-12-22 DIAGNOSIS — Z7901 Long term (current) use of anticoagulants: Secondary | ICD-10-CM | POA: Diagnosis not present

## 2022-12-22 DIAGNOSIS — E559 Vitamin D deficiency, unspecified: Secondary | ICD-10-CM

## 2022-12-22 DIAGNOSIS — I4891 Unspecified atrial fibrillation: Secondary | ICD-10-CM | POA: Diagnosis not present

## 2022-12-22 DIAGNOSIS — Z7985 Long-term (current) use of injectable non-insulin antidiabetic drugs: Secondary | ICD-10-CM

## 2022-12-22 MED ORDER — VITAMIN D (ERGOCALCIFEROL) 1.25 MG (50000 UNIT) PO CAPS: 50000.0000 [IU] | ORAL_CAPSULE | ORAL | 0 refills | Status: DC

## 2022-12-28 DIAGNOSIS — Z7901 Long term (current) use of anticoagulants: Secondary | ICD-10-CM | POA: Diagnosis not present

## 2022-12-28 DIAGNOSIS — I4891 Unspecified atrial fibrillation: Secondary | ICD-10-CM | POA: Diagnosis not present

## 2023-01-03 DIAGNOSIS — E1151 Type 2 diabetes mellitus with diabetic peripheral angiopathy without gangrene: Secondary | ICD-10-CM | POA: Diagnosis not present

## 2023-01-03 DIAGNOSIS — B351 Tinea unguium: Secondary | ICD-10-CM | POA: Diagnosis not present

## 2023-01-03 NOTE — Progress Notes (Unsigned)
Chief Complaint:   OBESITY Eduardo Butler is here to discuss his progress with his obesity treatment plan along with follow-up of his obesity related diagnoses. Eduardo Butler is on the Category 3 Plan and states he is following his eating plan approximately 80% of the time. Eduardo Butler states he is doing 0 minutes 0 times per week.  Today's visit was #: 5 Starting weight: 253 lbs Starting date: 10/24/2022 Today's weight: 250 lbs Today's date: 12/22/2022 Total lbs lost to date: 3 Total lbs lost since last in-office visit: 4  Interim History: Karen is down an additional 4 lbs since his last visit.   Subjective:   1. Vitamin D insufficiency Eduardo Butler is taking prescription Vitamin D.   2. Type 2 diabetes mellitus with other specified complication, without long-term current use of insulin (HCC) Eduardo Butler is taking Semaglutide 0.25 mg dose.   Assessment/Plan:   1. Vitamin D insufficiency We will refill prescription Vitamin D for 1 month.   - Vitamin D, Ergocalciferol, (DRISDOL) 1.25 MG (50000 UNIT) CAPS capsule; Take 1 capsule (50,000 Units total) by mouth every 7 (seven) days.  Dispense: 5 capsule; Refill: 0  2. Type 2 diabetes mellitus with other specified complication, without long-term current use of insulin (HCC) Eduardo Butler will continue Semaglutide 0.25 mg once weekly as directed.   3. Obesity, Current BMI 40.4 Eduardo Butler is currently in the action stage of change. As such, his goal is to continue with weight loss efforts. He has agreed to the Category 3 Plan.   Meal planning and intentional eating were discussed.   Exercise goals: Will start playing golf.  Behavioral modification strategies: increasing lean protein intake, decreasing simple carbohydrates, increasing vegetables, increasing water intake, decreasing eating out, no skipping meals, meal planning and cooking strategies, keeping healthy foods in the home, and planning for success.  Eduardo Butler has agreed to follow-up with our clinic in 2 weeks. He was informed  of the importance of frequent follow-up visits to maximize his success with intensive lifestyle modifications for his multiple health conditions.   Objective:   Blood pressure 124/67, pulse 60, temperature (!) 97.5 F (36.4 C), height '5\' 6"'$  (1.676 m), weight 250 lb (113.4 kg), SpO2 97 %. Body mass index is 40.35 kg/m.  General: Cooperative, alert, well developed, in no acute distress. HEENT: Conjunctivae and lids unremarkable. Cardiovascular: Regular rhythm.  Lungs: Normal work of breathing. Neurologic: No focal deficits.   Lab Results  Component Value Date   CREATININE 0.90 11/17/2022   BUN 19 11/17/2022   NA 138 11/17/2022   K 3.6 11/17/2022   CL 97 (L) 11/17/2022   CO2 27 10/24/2022   Lab Results  Component Value Date   ALT 13 10/24/2022   AST 18 10/24/2022   ALKPHOS 64 10/24/2022   BILITOT 0.4 10/24/2022   No results found for: "HGBA1C" Lab Results  Component Value Date   INSULIN 8.2 10/24/2022   Lab Results  Component Value Date   TSH 1.454 ***Test methodology is 3rd generation TSH*** 06/26/2007   Lab Results  Component Value Date   CHOL 128 11/24/2021   HDL 47 11/24/2021   LDLCALC 82 11/24/2021   TRIG 131 11/24/2021   CHOLHDL 2.7 10/21/2020   Lab Results  Component Value Date   VD25OH 33.8 10/24/2022   Lab Results  Component Value Date   WBC 8.5 04/15/2020   HGB 12.2 (L) 11/17/2022   HCT 36.0 (L) 11/17/2022   MCV 86 04/15/2020   PLT 278 04/15/2020   No results  found for: "IRON", "TIBC", "FERRITIN"  Attestation Statements:   Reviewed by clinician on day of visit: allergies, medications, problem list, medical history, surgical history, family history, social history, and previous encounter notes.   Wilhemena Durie, am acting as Location manager for [employer], DO.  I have reviewed the above documentation for accuracy and completeness, and I agree with the above. -  ***

## 2023-01-05 ENCOUNTER — Encounter: Payer: Self-pay | Admitting: Bariatrics

## 2023-01-05 ENCOUNTER — Ambulatory Visit (INDEPENDENT_AMBULATORY_CARE_PROVIDER_SITE_OTHER): Payer: PPO | Admitting: Bariatrics

## 2023-01-05 VITALS — BP 118/64 | HR 58 | Temp 97.7°F | Ht 66.0 in | Wt 247.0 lb

## 2023-01-05 DIAGNOSIS — E1169 Type 2 diabetes mellitus with other specified complication: Secondary | ICD-10-CM

## 2023-01-05 DIAGNOSIS — E559 Vitamin D deficiency, unspecified: Secondary | ICD-10-CM | POA: Diagnosis not present

## 2023-01-05 DIAGNOSIS — Z7985 Long-term (current) use of injectable non-insulin antidiabetic drugs: Secondary | ICD-10-CM | POA: Diagnosis not present

## 2023-01-05 DIAGNOSIS — Z6841 Body Mass Index (BMI) 40.0 and over, adult: Secondary | ICD-10-CM | POA: Diagnosis not present

## 2023-01-05 MED ORDER — VITAMIN D (ERGOCALCIFEROL) 1.25 MG (50000 UNIT) PO CAPS: 50000.0000 [IU] | ORAL_CAPSULE | ORAL | 0 refills | Status: DC

## 2023-01-05 MED ORDER — ONDANSETRON HCL 4 MG PO TABS: 4.0000 mg | ORAL_TABLET | Freq: Three times a day (TID) | ORAL | 0 refills | Status: DC | PRN

## 2023-01-09 DIAGNOSIS — G4733 Obstructive sleep apnea (adult) (pediatric): Secondary | ICD-10-CM | POA: Diagnosis not present

## 2023-01-11 DIAGNOSIS — Z7901 Long term (current) use of anticoagulants: Secondary | ICD-10-CM | POA: Diagnosis not present

## 2023-01-11 DIAGNOSIS — I4891 Unspecified atrial fibrillation: Secondary | ICD-10-CM | POA: Diagnosis not present

## 2023-01-16 ENCOUNTER — Encounter: Payer: Self-pay | Admitting: Cardiology

## 2023-01-16 ENCOUNTER — Ambulatory Visit: Payer: PPO | Attending: Cardiology | Admitting: Cardiology

## 2023-01-16 VITALS — BP 125/78 | HR 53 | Ht 66.0 in | Wt 247.0 lb

## 2023-01-16 DIAGNOSIS — I4819 Other persistent atrial fibrillation: Secondary | ICD-10-CM

## 2023-01-16 DIAGNOSIS — Z79899 Other long term (current) drug therapy: Secondary | ICD-10-CM | POA: Diagnosis not present

## 2023-01-16 DIAGNOSIS — I2510 Atherosclerotic heart disease of native coronary artery without angina pectoris: Secondary | ICD-10-CM

## 2023-01-16 DIAGNOSIS — I10 Essential (primary) hypertension: Secondary | ICD-10-CM | POA: Diagnosis not present

## 2023-01-16 DIAGNOSIS — D6869 Other thrombophilia: Secondary | ICD-10-CM

## 2023-01-16 NOTE — Patient Instructions (Signed)
Medication Instructions:  °Your physician recommends that you continue on your current medications as directed. Please refer to the Current Medication list given to you today. ° °*If you need a refill on your cardiac medications before your next appointment, please call your pharmacy* ° ° °Lab Work: °None ordered ° ° °Testing/Procedures: °None ordered ° ° °Follow-Up: °At [facility], you and your health needs are our priority.  As part of our continuing mission to provide you with exceptional heart care, we have created designated Provider Care Teams.  These Care Teams include your primary Cardiologist (physician) and Advanced Practice Providers (APPs -  Physician Assistants and Nurse Practitioners) who all work together to provide you with the care you need, when you need it. ° °Your next appointment:   °6 month(s) ° °The format for your next appointment:   °In Person ° °Provider:   °Will Camnitz, MD ° ° ° °Thank you for choosing [facility]!! ° ° °Charliene Inoue, RN °[phone] °  °

## 2023-01-16 NOTE — Progress Notes (Signed)
Electrophysiology Office Note   Date:  01/16/2023   ID:  Eduardo Butler, Eduardo Butler 1951/02/10, MRN [id number]  PCP:  Orpah Melter, MD  Cardiologist:  Gwenlyn Found Primary Electrophysiologist:  Isaih Bulger Meredith Leeds, MD    Chief Complaint: AF   History of Present Illness: Eduardo Butler is a 72 y.o. male who is being seen today for the evaluation of AF at the request of Orpah Melter, MD. Presenting today for electrophysiology evaluation.  He has a history seen for coronary artery disease post CABG x 4, PE due to lupus anticoagulant on long-term Coumadin, hyperlipidemia, atrial fibrillation.  His atrial fibrillation has become persistent.  He was loaded on amiodarone and had a cardioversion.  He was having symptoms of weakness, fatigue, shortness of breath.  Today, denies symptoms of palpitations, chest pain, shortness of breath, orthopnea, PND, lower extremity edema, claudication, dizziness, presyncope, syncope, bleeding, or neurologic sequela. The patient is tolerating medications without difficulties.  His cardioversion he has done well.  He feels like he has quite a bit more energy and less shortness of breath.  He states that he feels 10 years younger.  He is ready to start exercising.    Past Medical History:  Diagnosis Date   Anxiety    Arthritis    CHF (congestive heart failure) ([place])    Coronary artery disease    Diabetes ([place])    H/O cardiac catheterization 08/25/2006   normal cath-patent grafts   H/O Doppler ultrasound 02/21/2011   lower ext.venous doppler,, no treatment except support stockings are recommended if clinically indicated   H/O echocardiogram 12/02/2010   EF 55% , no significant abnormalities   History of stress test 01/04/2013   Lexiscan Myoview ; scattered PVC's ; LV EF 54% ; Normal stress test    Hyperlipidemia    Hypertension    Obesity    Peroneal DVT (deep venous thrombosis) (HCC)    Shortness of breath    Past Surgical History:  Procedure Laterality Date    BYPASS GRAFT  2003   4 bypass    CARDIAC CATHETERIZATION  02/23/02   S/P sudden cardiac death; three vessel coronary artery disease; preserved overall left ventricular systolic function with loculated mild to moderate anterolateral hypokinesis; no mitral regurgitation noted   CARDIOVERSION N/A 11/17/2022   Procedure: CARDIOVERSION;  Surgeon: [name], MD;  Location: [facility];  Service: Cardiovascular;  Laterality: N/A;   CORONARY ARTERY BYPASS GRAFT  2003   HERNIA REPAIR     LEFT HEART CATH AND CORS/GRAFTS ANGIOGRAPHY N/A 04/23/2020   Procedure: LEFT HEART CATH AND CORS/GRAFTS ANGIOGRAPHY;  Surgeon: Lorretta Harp, MD;  Location: [facility];  Service: Cardiovascular;  Laterality: N/A;   LUMBAR LAMINECTOMY/ DECOMPRESSION WITH MET-RX Left 07/08/2014   Procedure: Left Lumbar Four-Five Metrex foraminal microdiskectomy;  Surgeon: Consuella Lose, MD;  Location: [facility];  Service: Neurosurgery;  Laterality: Left;  Left Lumbar Four-Five Metrex foraminal microdiskectomy   PELVIC LAPAROSCOPY  2005   SHOULDER SURGERY     UMBILICAL HERNIA REPAIR  11/17/2011   Procedure: HERNIA REPAIR UMBILICAL ADULT;  Surgeon: Harl Bowie, MD;  Location: [facility];  Service: General;  Laterality: N/A;  umbilical hernia repair with mesh     Current Outpatient Medications  Medication Sig Dispense Refill   acetaminophen (TYLENOL) 500 MG tablet Take 1,000-1,500 mg by mouth every 6 (six) hours as needed for moderate pain.     albuterol (VENTOLIN HFA) 108 (90 Base) MCG/ACT inhaler  Inhale 2 puffs into the lungs every 6 (six) hours as needed for shortness of breath.      ALPRAZolam (XANAX) 0.5 MG tablet Take 0.25 mg by mouth at bedtime.     amiodarone (PACERONE) 200 MG tablet Take 1 tablet (200 mg total) by mouth daily. 30 tablet 6   ammonium lactate (LAC-HYDRIN) 12 % lotion Apply 1 application  topically daily.     aspirin EC 81 MG tablet Take 81 mg by mouth daily.      cetirizine (ZYRTEC) 10 MG tablet Take 1 tablet (10 mg total) by mouth once as needed for up to 1 dose for allergies or rhinitis. (Patient taking differently: Take 10 mg by mouth daily as needed for allergies or rhinitis.) 5 tablet 0   Elastic Bandages & Supports (MEDICAL COMPRESSION STOCKINGS) MISC Knee High 20-30 mm compression stockings 2 each 0   ezetimibe (ZETIA) 10 MG tablet Take 1 tablet (10 mg total) by mouth daily. 100 tablet 3   fluticasone (FLONASE) 50 MCG/ACT nasal spray Place 1 spray into both nostrils daily as needed for allergies or rhinitis.     folic acid (FOLVITE) 1 MG tablet Take 1 mg by mouth daily.     furosemide (LASIX) 80 MG tablet TAKE 1 TABLET(80 MG) BY MOUTH DAILY 90 tablet 2   HYDROcodone-acetaminophen (NORCO/VICODIN) 5-325 MG tablet Take 1 tablet by mouth every 6 (six) hours as needed (pain).     losartan (COZAAR) 25 MG tablet TAKE 1 TABLET(25 MG) BY MOUTH DAILY 90 tablet 3   metFORMIN (GLUCOPHAGE) 1000 MG tablet Take 1,000 mg by mouth 2 (two) times daily with a meal.     Misc Natural Products (IMMUNE FORMULA PO) Take 1 each by mouth daily. Immune: Vitamin C, D and Zinc. Chew 1 tablets daily.     omeprazole (PRILOSEC) 40 MG capsule TAKE ONE CAPSULE BY MOUTH DAILY AS NEEDED FOR HEARTBURN 90 capsule 3   ondansetron (ZOFRAN) 4 MG tablet Take 1 tablet (4 mg total) by mouth every 8 (eight) hours as needed for nausea or vomiting. 20 tablet 0   pioglitazone (ACTOS) 45 MG tablet Take 45 mg by mouth daily.      rosuvastatin (CRESTOR) 20 MG tablet Take 1 tablet (20 mg total) by mouth daily. 30 tablet 6   Semaglutide,0.25 or 0.5MG/DOS, (OZEMPIC, 0.25 OR 0.5 MG/DOSE,) 2 MG/3ML SOPN Inject 0.25 mg into the skin once a week. 3 mL 0   Vitamin D, Ergocalciferol, (DRISDOL) 1.25 MG (50000 UNIT) CAPS capsule Take 1 capsule (50,000 Units total) by mouth every 7 (seven) days. 5 capsule 0   warfarin (COUMADIN) 3 MG tablet Take 3 mg by mouth daily.     warfarin (COUMADIN) 4 MG tablet Take 4  mg by mouth See admin instructions. Take Sunday and Tuesday and Thursday     warfarin (COUMADIN) 5 MG tablet Take 5 mg by mouth See admin instructions. Take on Monday,  Wednesday, Friday and Saturday     carvedilol (COREG) 6.25 MG tablet Take 6.25 mg by mouth 2 (two) times daily with a meal.     No current facility-administered medications for this visit.    Allergies:   Morphine and related   Social History:  The patient  reports that he has never smoked. He has never used smokeless tobacco. He reports that he does not drink alcohol and does not use drugs.   Family History:  The patient's family history includes Diabetes in his brother and mother; Hypertension in his  brother.   ROS:  Please see the history of present illness.   Otherwise, review of systems is positive for none.   All other systems are reviewed and negative.   PHYSICAL EXAM: VS:  BP 125/78   Pulse (!) 53   Ht 5' 6"$  (1.676 m)   Wt 247 lb (112 kg)   SpO2 97%   BMI 39.87 kg/m  , BMI Body mass index is 39.87 kg/m. GEN: Well nourished, well developed, in no acute distress  HEENT: normal  Neck: no JVD, carotid bruits, or masses Cardiac: RRR; no murmurs, rubs, or gallops,no edema  Respiratory:  clear to auscultation bilaterally, normal work of breathing GI: soft, nontender, nondistended, + BS MS: no deformity or atrophy  Skin: warm and dry Neuro:  Strength and sensation are intact Psych: euthymic mood, full affect  EKG:  EKG is ordered today. Personal review of the ekg ordered shows sinus rhythm    Recent Labs: 10/24/2022: ALT 13 11/17/2022: BUN 19; Creatinine, Ser 0.90; Hemoglobin 12.2; Potassium 3.6; Sodium 138    Lipid Panel     Component Value Date/Time   CHOL 128 11/24/2021 0000   CHOL 117 10/21/2020 1152   TRIG 131 11/24/2021 0000   HDL 47 11/24/2021 0000   HDL 44 10/21/2020 1152   CHOLHDL 2.7 10/21/2020 1152   CHOLHDL 3.6 06/26/2007 1510   VLDL 25 06/26/2007 1510   LDLCALC 82 11/24/2021 0000    LDLCALC 59 10/21/2020 1152     Wt Readings from Last 3 Encounters:  01/16/23 247 lb (112 kg)  01/05/23 247 lb (112 kg)  12/22/22 250 lb (113.4 kg)      Other studies Reviewed: Additional studies/ records that were reviewed today include: TTE 04/21/21  Review of the above records today demonstrates:   1. Left ventricular ejection fraction, by estimation, is 55 to 60%. The  left ventricle has normal function. The left ventricle has no regional  wall motion abnormalities. There is mild left ventricular hypertrophy.  Left ventricular diastolic parameters  were normal.   2. Right ventricular systolic function is normal. The right ventricular  size is normal.   3. The mitral valve is grossly normal. Trivial mitral valve  regurgitation.   4. The aortic valve is tricuspid. Aortic valve regurgitation is not  visualized.   5. The inferior vena cava is normal in size with greater than 50%  respiratory variability, suggesting right atrial pressure of 3 mmHg.    ASSESSMENT AND PLAN:  1.  Persistent atrial fibrillation: CHA2DS2-VASc of 3.  Currently on Coumadin.  Was recently started on amiodarone.  Remains in sinus rhythm post cardioversion.  Feeling well.  No changes.  2.  Coronary artery disease: Status post CABG x 4.  No current chest pain.  Plan per primary cardiology.  3.  Hypertension: Currently well-controlled  4.  Hyperlipidemia: Continue simvastatin per primary cardiology  5.  Secondary hypercoagulable state: Currently on Coumadin for atrial fibrillation as above  6.  High risk medication monitoring: Currently on amiodarone.  Labs without abnormality.   Current medicines are reviewed at length with the patient today.   The patient does not have concerns regarding his medicines.  The following changes were made today: None  Labs/ tests ordered today include:  Orders Placed This Encounter  Procedures   EKG 12-Lead     Disposition:   FU 6 months  Signed, Aser Nylund Meredith Leeds, MD  01/16/2023 2:50 PM     [facility] [address] [place] [address] [phone] (office) [phone] (fax)

## 2023-01-19 ENCOUNTER — Encounter: Payer: Self-pay | Admitting: Bariatrics

## 2023-01-19 ENCOUNTER — Ambulatory Visit (INDEPENDENT_AMBULATORY_CARE_PROVIDER_SITE_OTHER): Payer: PPO | Admitting: Bariatrics

## 2023-01-19 VITALS — BP 117/65 | HR 60 | Ht 66.0 in | Wt 243.0 lb

## 2023-01-19 DIAGNOSIS — Z6841 Body Mass Index (BMI) 40.0 and over, adult: Secondary | ICD-10-CM

## 2023-01-19 DIAGNOSIS — E559 Vitamin D deficiency, unspecified: Secondary | ICD-10-CM | POA: Diagnosis not present

## 2023-01-19 DIAGNOSIS — Z7985 Long-term (current) use of injectable non-insulin antidiabetic drugs: Secondary | ICD-10-CM | POA: Diagnosis not present

## 2023-01-19 DIAGNOSIS — E1169 Type 2 diabetes mellitus with other specified complication: Secondary | ICD-10-CM

## 2023-01-19 MED ORDER — VITAMIN D (ERGOCALCIFEROL) 1.25 MG (50000 UNIT) PO CAPS: 50000.0000 [IU] | ORAL_CAPSULE | ORAL | 0 refills | Status: DC

## 2023-01-19 MED ORDER — OZEMPIC (0.25 OR 0.5 MG/DOSE) 2 MG/3ML ~~LOC~~ SOPN: 0.2500 mg | PEN_INJECTOR | SUBCUTANEOUS | 0 refills | Status: DC

## 2023-01-19 NOTE — Progress Notes (Unsigned)
Chief Complaint:   OBESITY Eduardo Butler is here to discuss his progress with his obesity treatment plan along with follow-up of his obesity related diagnoses. Eduardo Butler is on the Category 3 plan and states he is following his eating plan approximately 90% of the time. Eduardo Butler states he has not been exercising.  Today's visit was #: 6 Starting weight: 253 lbs Starting date: 10/24/22 Today's weight: 247 lbs Today's date: 01/05/23 Total lbs lost to date: 6 Total lbs lost since last in-office visit: -3  Interim History: He is down an additional 3 pounds since his last visit.  Subjective:   1. Vitamin D insufficiency Taking vitamin D supplementation.  2. Type 2 diabetes mellitus with other specified complication, without long-term current use of insulin (HCC) Mild nausea with Ozempic.  Assessment/Plan:   1. Vitamin D insufficiency Refill vitamin D 50,000 IU weekly, #5, no refills.   2. Type 2 diabetes mellitus with other specified complication, without long-term current use of insulin (Eduardo Butler) 1.  Per insurance -semaglutide-will only pay for 1 month supply.  2.  Refill: Ozempic 0.25 mg into the skin weekly 3 ml with 0 refills. He can go up to the 0.5 dose if tolerated.  - ondansetron (ZOFRAN) 4 MG tablet; Take 1 tablet (4 mg total) by mouth every 8 (eight) hours as needed for nausea or vomiting.  Dispense: 20 tablet; Refill: 0  3. Morbid obesity, BMI 40.0-44.9, adult ([place]) 1.  Meal planning. 2.  Will continue to adhere closely to the plan.  Eduardo Butler is currently in the action stage of change. As such, his goal is to continue with weight loss efforts. He has agreed to the Category 3 plan.  Exercise goals: All adults should avoid inactivity. Some physical activity is better than none, and adults who participate in any amount of physical activity gain some health benefits.  Behavioral modification strategies: increasing lean protein intake, decreasing simple carbohydrates, increasing vegetables,  increasing water intake, decreasing eating out, no skipping meals, meal planning and cooking strategies, keeping healthy foods in the home, and planning for success.  Eduardo Butler has agreed to follow-up with our clinic in 2 weeks. He was informed of the importance of frequent follow-up visits to maximize his success with intensive lifestyle modifications for his multiple health conditions.   Objective:   Blood pressure 118/64, pulse (!) 58, temperature 97.7 F (36.5 C), height '5\' 6"'$  (1.676 m), weight 247 lb (112 kg), SpO2 96 %. Body mass index is 39.87 kg/m.  General: Cooperative, alert, well developed, in no acute distress. HEENT: Conjunctivae and lids unremarkable. Cardiovascular: Regular rhythm.  Lungs: Normal work of breathing. Neurologic: No focal deficits.   Lab Results  Component Value Date   CREATININE 0.90 11/17/2022   BUN 19 11/17/2022   NA 138 11/17/2022   K 3.6 11/17/2022   CL 97 (L) 11/17/2022   CO2 27 10/24/2022   Lab Results  Component Value Date   ALT 13 10/24/2022   AST 18 10/24/2022   ALKPHOS 64 10/24/2022   BILITOT 0.4 10/24/2022   No results found for: "HGBA1C" Lab Results  Component Value Date   INSULIN 8.2 10/24/2022   Lab Results  Component Value Date   TSH 1.454 ***Test methodology is 3rd generation TSH*** 06/26/2007   Lab Results  Component Value Date   CHOL 128 11/24/2021   HDL 47 11/24/2021   LDLCALC 82 11/24/2021   TRIG 131 11/24/2021   CHOLHDL 2.7 10/21/2020   Lab Results  Component Value Date  VD25OH 33.8 10/24/2022   Lab Results  Component Value Date   WBC 8.5 04/15/2020   HGB 12.2 (L) 11/17/2022   HCT 36.0 (L) 11/17/2022   MCV 86 04/15/2020   PLT 278 04/15/2020   No results found for: "IRON", "TIBC", "FERRITIN"  Attestation Statements:   Reviewed by clinician on day of visit: allergies, medications, problem list, medical history, surgical history, family history, social history, and previous encounter notes.  I, Dawn  Whitmire, FNP-C, am acting as transcriptionist for Dr. Jearld Lesch.  I have reviewed the above documentation for accuracy and completeness, and I agree with the above. Jearld Lesch, DO

## 2023-01-23 ENCOUNTER — Other Ambulatory Visit: Payer: Self-pay | Admitting: Cardiovascular Disease

## 2023-01-24 ENCOUNTER — Encounter: Payer: Self-pay | Admitting: Bariatrics

## 2023-01-25 ENCOUNTER — Encounter: Payer: Self-pay | Admitting: Bariatrics

## 2023-01-30 DIAGNOSIS — Z7901 Long term (current) use of anticoagulants: Secondary | ICD-10-CM | POA: Diagnosis not present

## 2023-01-30 DIAGNOSIS — I4891 Unspecified atrial fibrillation: Secondary | ICD-10-CM | POA: Diagnosis not present

## 2023-01-31 NOTE — Progress Notes (Unsigned)
Chief Complaint:   OBESITY Eduardo Butler is here to discuss his progress with his obesity treatment plan along with follow-up of his obesity related diagnoses. Eduardo Butler is on the Category 3 plan and states he is following his eating plan approximately 90% of the time. Eduardo Butler states he is golfing for 3 hours 1 time per week.  Today's visit was #: 7 Starting weight: 253 lbs Starting date: 10/24/22 Today's weight: 243 lbs Today's date: 01/19/23 Total lbs lost to date: 10 Total lbs lost since last in-office visit: -4  Interim History: He is down another 4 pounds since his last visit.  He is not drinking enough water.  Subjective:   1. Type 2 diabetes mellitus with other specified complication, without long-term current use of insulin (HCC) Taking Ozempic/helps with appetite.  Occasional nausea with Ozempic.  Has Zofran.  2. Vitamin D insufficiency Taking vitamin D.   Assessment/Plan:   1. Type 2 diabetes mellitus with other specified complication, without long-term current use of insulin (HCC) Refill- - Semaglutide,0.25 or 0.'5MG'$ /DOS, (OZEMPIC, 0.25 OR 0.5 MG/DOSE,) 2 MG/3ML SOPN; Inject 0.25 mg into the skin once a week.  Dispense: 3 mL; Refill: 0 Continue Zofran as needed.  2. Vitamin D insufficiency Refill- - Vitamin D, Ergocalciferol, (DRISDOL) 1.25 MG (50000 UNIT) CAPS capsule; Take 1 capsule (50,000 Units total) by mouth every 7 (seven) days.  Dispense: 5 capsule; Refill: 0  3. Morbid obesity ([place]) BMI 40.0-44.9, adult ([place]) 1.  Increase water ([place]). 2.  Keep protein high.  Eduardo Butler is currently in the action stage of change. As such, his goal is to continue with weight loss efforts. He has agreed to the Category 3 plan.  Exercise goals:  as is  Behavioral modification strategies: increasing lean protein intake, decreasing simple carbohydrates, increasing vegetables, increasing water intake, decreasing eating out, no skipping meals, meal planning and cooking strategies,  keeping healthy foods in the home, avoiding temptations, and planning for success.  Eduardo Butler has agreed to follow-up with our clinic in 3-4 weeks. He was informed of the importance of frequent follow-up visits to maximize his success with intensive lifestyle modifications for his multiple health conditions.    Objective:   Blood pressure 117/65, pulse 60, height '5\' 6"'$  (1.676 m), weight 243 lb (110.2 kg), SpO2 96 %. Body mass index is 39.22 kg/m.  General: Cooperative, alert, well developed, in no acute distress. HEENT: Conjunctivae and lids unremarkable. Cardiovascular: Regular rhythm.  Lungs: Normal work of breathing. Neurologic: No focal deficits.   Lab Results  Component Value Date   CREATININE 0.90 11/17/2022   BUN 19 11/17/2022   NA 138 11/17/2022   K 3.6 11/17/2022   CL 97 (L) 11/17/2022   CO2 27 10/24/2022   Lab Results  Component Value Date   ALT 13 10/24/2022   AST 18 10/24/2022   ALKPHOS 64 10/24/2022   BILITOT 0.4 10/24/2022   No results found for: "HGBA1C" Lab Results  Component Value Date   INSULIN 8.2 10/24/2022   Lab Results  Component Value Date   TSH 1.454 ***Test methodology is 3rd generation TSH*** 06/26/2007   Lab Results  Component Value Date   CHOL 128 11/24/2021   HDL 47 11/24/2021   LDLCALC 82 11/24/2021   TRIG 131 11/24/2021   CHOLHDL 2.7 10/21/2020   Lab Results  Component Value Date   VD25OH 33.8 10/24/2022   Lab Results  Component Value Date   WBC 8.5 04/15/2020   HGB 12.2 (L) 11/17/2022  HCT 36.0 (L) 11/17/2022   MCV 86 04/15/2020   PLT 278 04/15/2020   No results found for: "IRON", "TIBC", "FERRITIN"  Attestation Statements:   Reviewed by clinician on day of visit: allergies, medications, problem list, medical history, surgical history, family history, social history, and previous encounter notes.  I, Dawn Whitmire, FNP-C, am acting as transcriptionist for Dr. Jearld Lesch.  I have reviewed the above documentation for  accuracy and completeness, and I agree with the above. -  ***

## 2023-02-02 ENCOUNTER — Telehealth (INDEPENDENT_AMBULATORY_CARE_PROVIDER_SITE_OTHER): Payer: Self-pay | Admitting: Bariatrics

## 2023-02-02 ENCOUNTER — Encounter: Payer: Self-pay | Admitting: Bariatrics

## 2023-02-02 NOTE — Telephone Encounter (Signed)
Notified patient per his prescription, Dr. Owens Shark prescribed 0.25 mg once a week. Patient verbalized understanding.

## 2023-02-07 DIAGNOSIS — G4733 Obstructive sleep apnea (adult) (pediatric): Secondary | ICD-10-CM | POA: Diagnosis not present

## 2023-02-13 ENCOUNTER — Other Ambulatory Visit: Payer: Self-pay | Admitting: Cardiology

## 2023-02-13 DIAGNOSIS — E1169 Type 2 diabetes mellitus with other specified complication: Secondary | ICD-10-CM | POA: Diagnosis not present

## 2023-02-13 DIAGNOSIS — D6869 Other thrombophilia: Secondary | ICD-10-CM | POA: Diagnosis not present

## 2023-02-13 DIAGNOSIS — Z7901 Long term (current) use of anticoagulants: Secondary | ICD-10-CM | POA: Diagnosis not present

## 2023-02-13 DIAGNOSIS — I10 Essential (primary) hypertension: Secondary | ICD-10-CM | POA: Diagnosis not present

## 2023-02-13 DIAGNOSIS — I25810 Atherosclerosis of coronary artery bypass graft(s) without angina pectoris: Secondary | ICD-10-CM | POA: Diagnosis not present

## 2023-02-13 DIAGNOSIS — I4891 Unspecified atrial fibrillation: Secondary | ICD-10-CM | POA: Diagnosis not present

## 2023-02-13 DIAGNOSIS — Z125 Encounter for screening for malignant neoplasm of prostate: Secondary | ICD-10-CM | POA: Diagnosis not present

## 2023-02-13 DIAGNOSIS — E119 Type 2 diabetes mellitus without complications: Secondary | ICD-10-CM | POA: Diagnosis not present

## 2023-02-13 DIAGNOSIS — E782 Mixed hyperlipidemia: Secondary | ICD-10-CM | POA: Diagnosis not present

## 2023-02-13 DIAGNOSIS — F419 Anxiety disorder, unspecified: Secondary | ICD-10-CM | POA: Diagnosis not present

## 2023-02-16 DIAGNOSIS — G4733 Obstructive sleep apnea (adult) (pediatric): Secondary | ICD-10-CM | POA: Diagnosis not present

## 2023-02-20 ENCOUNTER — Encounter: Payer: Self-pay | Admitting: Bariatrics

## 2023-02-20 ENCOUNTER — Ambulatory Visit (INDEPENDENT_AMBULATORY_CARE_PROVIDER_SITE_OTHER): Payer: PPO | Admitting: Bariatrics

## 2023-02-20 VITALS — BP 110/54 | HR 57 | Temp 97.4°F | Ht 66.0 in | Wt 235.0 lb

## 2023-02-20 DIAGNOSIS — E1169 Type 2 diabetes mellitus with other specified complication: Secondary | ICD-10-CM | POA: Diagnosis not present

## 2023-02-20 DIAGNOSIS — Z6841 Body Mass Index (BMI) 40.0 and over, adult: Secondary | ICD-10-CM | POA: Diagnosis not present

## 2023-02-20 DIAGNOSIS — Z7985 Long-term (current) use of injectable non-insulin antidiabetic drugs: Secondary | ICD-10-CM

## 2023-02-20 DIAGNOSIS — E559 Vitamin D deficiency, unspecified: Secondary | ICD-10-CM

## 2023-02-20 MED ORDER — VITAMIN D (ERGOCALCIFEROL) 1.25 MG (50000 UNIT) PO CAPS: 50000.0000 [IU] | ORAL_CAPSULE | ORAL | 0 refills | Status: DC

## 2023-02-21 NOTE — Progress Notes (Unsigned)
Chief Complaint:   OBESITY Eduardo Butler is here to discuss his progress with his obesity treatment plan along with follow-up of his obesity related diagnoses. Eduardo Butler is on the Category 3 Plan and states he is following his eating plan approximately 75% of the time. Eduardo Butler states he is playing golf for 3 hours 1 time per week.    Today's visit was #: 8 Starting weight: 253 lbs Starting date: 10/24/2022 Today's weight: 235 lbs Today's date: 02/20/2023 Total lbs lost to date: 18 Total lbs lost since last in-office visit: 8  Interim History: Eduardo Butler is down 8 pounds since his last visit.  Subjective:   1. Vitamin D insufficiency Eduardo Butler is taking prescription vitamin D as directed.  2. Type 2 diabetes mellitus with other specified complication, without long-term current use of insulin (HCC) Eduardo Butler's last A1c has decreased from 7.2 to 6.4.  He denies side effects.  Assessment/Plan:   1. Vitamin D insufficiency Eduardo Butler will continue prescription vitamin D, and we will refill for 1 month.  - Vitamin D, Ergocalciferol, (DRISDOL) 1.25 MG (50000 UNIT) CAPS capsule; Take 1 capsule (50,000 Units total) by mouth every 7 (seven) days.  Dispense: 5 capsule; Refill: 0  2. Type 2 diabetes mellitus with other specified complication, without long-term current use of insulin (HCC) Eduardo Butler will continue Ozempic, and he will take Zofran if needed.  3. Morbid obesity ([place])  4. BMI 40.0-44.9, adult [facility]) Eduardo Butler is currently in the action stage of change. As such, his goal is to continue with weight loss efforts. He has agreed to the Category 3 Plan.   Meal planning and intentional eating were discussed.  Handout on high protein sources was given.  Exercise goals: As is.   Behavioral modification strategies: increasing lean protein intake, decreasing simple carbohydrates, increasing vegetables, increasing water intake, decreasing eating out, no skipping meals, meal planning and cooking strategies, keeping healthy foods  in the home, and planning for success.  Eduardo Butler has agreed to follow-up with our clinic in 3 weeks. He was informed of the importance of frequent follow-up visits to maximize his success with intensive lifestyle modifications for his multiple health conditions.   Objective:   Blood pressure (!) 110/54, pulse (!) 57, temperature (!) 97.4 F (36.3 C), height 5\' 6"  (1.676 m), weight 235 lb (106.6 kg), SpO2 98 %. Body mass index is 37.93 kg/m.  General: Cooperative, alert, well developed, in no acute distress. HEENT: Conjunctivae and lids unremarkable. Cardiovascular: Regular rhythm.  Lungs: Normal work of breathing. Neurologic: No focal deficits.   Lab Results  Component Value Date   CREATININE 0.90 11/17/2022   BUN 19 11/17/2022   NA 138 11/17/2022   K 3.6 11/17/2022   CL 97 (L) 11/17/2022   CO2 27 10/24/2022   Lab Results  Component Value Date   ALT 13 10/24/2022   AST 18 10/24/2022   ALKPHOS 64 10/24/2022   BILITOT 0.4 10/24/2022   No results found for: "HGBA1C" Lab Results  Component Value Date   INSULIN 8.2 10/24/2022   Lab Results  Component Value Date   TSH 1.454 ***Test methodology is 3rd generation TSH*** 06/26/2007   Lab Results  Component Value Date   CHOL 128 11/24/2021   HDL 47 11/24/2021   LDLCALC 82 11/24/2021   TRIG 131 11/24/2021   CHOLHDL 2.7 10/21/2020   Lab Results  Component Value Date   VD25OH 33.8 10/24/2022   Lab Results  Component Value Date   WBC 8.5 04/15/2020  HGB 12.2 (L) 11/17/2022   HCT 36.0 (L) 11/17/2022   MCV 86 04/15/2020   PLT 278 04/15/2020   No results found for: "IRON", "TIBC", "FERRITIN"  Attestation Statements:   Reviewed by clinician on day of visit: allergies, medications, problem list, medical history, surgical history, family history, social history, and previous encounter notes.   Wilhemena Durie, am acting as Location manager for [employer], DO.  I have reviewed the above documentation for accuracy and  completeness, and I agree with the above. -  ***

## 2023-03-10 DIAGNOSIS — G4733 Obstructive sleep apnea (adult) (pediatric): Secondary | ICD-10-CM | POA: Diagnosis not present

## 2023-03-14 ENCOUNTER — Encounter: Payer: Self-pay | Admitting: Bariatrics

## 2023-03-14 ENCOUNTER — Ambulatory Visit (INDEPENDENT_AMBULATORY_CARE_PROVIDER_SITE_OTHER): Payer: PPO | Admitting: Bariatrics

## 2023-03-14 VITALS — BP 115/67 | HR 55 | Temp 97.4°F | Ht 66.0 in | Wt 232.0 lb

## 2023-03-14 DIAGNOSIS — Z6837 Body mass index (BMI) 37.0-37.9, adult: Secondary | ICD-10-CM

## 2023-03-14 DIAGNOSIS — E669 Obesity, unspecified: Secondary | ICD-10-CM | POA: Diagnosis not present

## 2023-03-14 DIAGNOSIS — E785 Hyperlipidemia, unspecified: Secondary | ICD-10-CM | POA: Diagnosis not present

## 2023-03-14 DIAGNOSIS — E782 Mixed hyperlipidemia: Secondary | ICD-10-CM

## 2023-03-14 DIAGNOSIS — E1169 Type 2 diabetes mellitus with other specified complication: Secondary | ICD-10-CM

## 2023-03-14 MED ORDER — OZEMPIC (0.25 OR 0.5 MG/DOSE) 2 MG/3ML ~~LOC~~ SOPN: 0.2500 mg | PEN_INJECTOR | SUBCUTANEOUS | 0 refills | Status: DC

## 2023-03-14 NOTE — Progress Notes (Signed)
   WEIGHT SUMMARY AND BIOMETRICS  Weight Lost Since Last Visit: 3lb   Vitals Temp: (!) 97.4 F (36.3 C) BP: 115/67 Pulse Rate: (!) 55 SpO2: 96 %   Anthropometric Measurements Height:  (1.676 m) Weight: 232 lb (105.2 kg) BMI (Calculated): 37.46 Weight at Last Visit: 23lb Weight Lost Since Last Visit: 3lb Starting Weight: 253lb Total Weight Loss (lbs): 21 lb (9.526 kg)   Body Composition  Body Fat %: 36.1 % Fat Mass (lbs): 83.8 lbs Muscle Mass (lbs): 140.8 lbs Total Body Water (lbs): 121.8 lbs Visceral Fat Rating : 24   Other Clinical Data Fasting: no Labs: no Today's Visit #: 9 Starting Date: 10/24/22    OBESITY Eduardo Butler is here to discuss his progress with his obesity treatment plan along with follow-up of his obesity related diagnoses.     Nutrition Plan: the Category 3 plan - 50% adherence.  Current exercise:  Golf  Interim History:  He is down another 3 lbs since his last visit.  Protein intake is as prescribed and Is not skipping meals  Pharmacotherapy: Eduardo Butler is on  Metformin 1,000 mg Bid daily with a meal. Adverse side effects: None Hunger is well controlled.  Cravings are moderately controlled.  Assessment/Plan:   Hyperlipidemia  Medication(s): Zetia 10 mg, Actos and Ozempic Cardiovascular risk factors: advanced age (older than 15 for men, 30 for women), diabetes mellitus, dyslipidemia, male gender, obesity (BMI >= 30 kg/m2), and sedentary lifestyle  Lab Results  Component Value Date   CHOL 128 11/24/2021   HDL 47 11/24/2021   LDLCALC 82 11/24/2021   TRIG 131 11/24/2021   CHOLHDL 2.7 10/21/2020   Lab Results  Component Value Date   ALT 13 10/24/2022   AST 18 10/24/2022   ALKPHOS 64 10/24/2022   BILITOT 0.4 10/24/2022   The ASCVD Risk score (Arnett DK, et al., 2019) failed to calculate for the following reasons:   The valid total cholesterol range is 130 to 320 mg/dL  Plan:  Continue statin.  Information sheet on  healthy vs unhealthy fats.  Will avoid all trans fats.  Will read labels Will minimize saturated fats except the following: low fat meats in moderation, diary, and limited dark chocolate.  Increase Omega 3 in foods, and consider an Omega 3 supplement.    Generalized Obesity: Current BMI BMI (Calculated): 37.46   Pharmacotherapy Plan Continue and refill  Ozempic 0.25 mg SQ weekly  Eduardo Butler is currently in the action stage of change. As such, his goal is to continue with weight loss efforts.  He has agreed to the Category 3 plan.  Exercise goals: Older adults should determine their level of effort for physical activity relative to their level of fitness.   Behavioral modification strategies: increasing lean protein intake, no meal skipping, and better snacking choices.  Eduardo Butler has agreed to follow-up with our clinic in 4 weeks.      Objective:   VITALS: Per patient if applicable, see vitals. GENERAL: Alert and in no acute distress. CARDIOPULMONARY: No increased WOB. Speaking in clear sentences.  PSYCH: Pleasant and cooperative. Speech normal rate and rhythm. Affect is appropriate. Insight and judgement are appropriate. Attention is focused, linear, and appropriate.  NEURO: Oriented as arrived to appointment on time with no prompting.   Attestation Statements:   This was prepared with the assistance of Engineer, civil (consulting).  Occasional wrong-word or sound-a-like substitutions may have occurred due to the inherent limitations of voice recognition software.   Corinna Capra, DO

## 2023-03-16 DIAGNOSIS — I4891 Unspecified atrial fibrillation: Secondary | ICD-10-CM | POA: Diagnosis not present

## 2023-03-16 DIAGNOSIS — Z7901 Long term (current) use of anticoagulants: Secondary | ICD-10-CM | POA: Diagnosis not present

## 2023-03-16 DIAGNOSIS — E1169 Type 2 diabetes mellitus with other specified complication: Secondary | ICD-10-CM | POA: Diagnosis not present

## 2023-03-20 ENCOUNTER — Telehealth (INDEPENDENT_AMBULATORY_CARE_PROVIDER_SITE_OTHER): Payer: Self-pay | Admitting: Bariatrics

## 2023-03-20 ENCOUNTER — Other Ambulatory Visit: Payer: Self-pay | Admitting: Bariatrics

## 2023-03-20 DIAGNOSIS — E1169 Type 2 diabetes mellitus with other specified complication: Secondary | ICD-10-CM

## 2023-03-20 NOTE — Telephone Encounter (Signed)
Left message to return call 

## 2023-03-20 NOTE — Telephone Encounter (Signed)
Patient called back. Please return his call.

## 2023-03-20 NOTE — Telephone Encounter (Signed)
Pt called 03/20/23 he checked with his pharmacy for his refill on Ozempic he was told by the pharmacy They need a Prior Auth to refill his ozempic.

## 2023-03-21 NOTE — Telephone Encounter (Signed)
Spoke to patient regarding his Ozempic and patient stated he has already picked up his medication.

## 2023-03-30 DIAGNOSIS — I4891 Unspecified atrial fibrillation: Secondary | ICD-10-CM | POA: Diagnosis not present

## 2023-03-30 DIAGNOSIS — Z7901 Long term (current) use of anticoagulants: Secondary | ICD-10-CM | POA: Diagnosis not present

## 2023-04-06 ENCOUNTER — Encounter: Payer: Self-pay | Admitting: Bariatrics

## 2023-04-06 ENCOUNTER — Ambulatory Visit (INDEPENDENT_AMBULATORY_CARE_PROVIDER_SITE_OTHER): Payer: PPO | Admitting: Bariatrics

## 2023-04-06 VITALS — BP 127/75 | HR 52 | Temp 97.5°F | Ht 66.0 in | Wt 229.0 lb

## 2023-04-06 DIAGNOSIS — Z6836 Body mass index (BMI) 36.0-36.9, adult: Secondary | ICD-10-CM | POA: Diagnosis not present

## 2023-04-06 DIAGNOSIS — Z6841 Body Mass Index (BMI) 40.0 and over, adult: Secondary | ICD-10-CM

## 2023-04-06 DIAGNOSIS — E1169 Type 2 diabetes mellitus with other specified complication: Secondary | ICD-10-CM

## 2023-04-06 DIAGNOSIS — Z7985 Long-term (current) use of injectable non-insulin antidiabetic drugs: Secondary | ICD-10-CM

## 2023-04-09 DIAGNOSIS — G4733 Obstructive sleep apnea (adult) (pediatric): Secondary | ICD-10-CM | POA: Diagnosis not present

## 2023-04-11 DIAGNOSIS — E1151 Type 2 diabetes mellitus with diabetic peripheral angiopathy without gangrene: Secondary | ICD-10-CM | POA: Diagnosis not present

## 2023-04-11 DIAGNOSIS — B351 Tinea unguium: Secondary | ICD-10-CM | POA: Diagnosis not present

## 2023-04-11 NOTE — Progress Notes (Unsigned)
Chief Complaint:   OBESITY Eduardo Butler is here to discuss his progress with his obesity treatment plan along with follow-up of his obesity related diagnoses. Eduardo Butler is on the Category 3 Plan and states he is following his eating plan approximately 50% of the time. Vandy states he is playing golf and doing yard work.    Today's visit was #: 10 Starting weight: 253 lbs Starting date: 10/24/2022 Today's weight: 229 lbs Today's date: 04/06/2023 Total lbs lost to date: 24 Total lbs lost since last in-office visit: 3  Interim History: Eduardo Butler is down 3 lbs since his last visit.   Subjective:   1. Type 2 diabetes mellitus with other specified complication, without long-term current use of insulin (HCC) Eduardo Butler is taking Ozempic and he notes occasional nausea. His last A1c was 6.4.  Assessment/Plan:   1. Type 2 diabetes mellitus with other specified complication, without long-term current use of insulin (HCC) Adit will continue Ozempic. He will keep all carbohydrates low (sugar and starches).   2. Morbid obesity (HCC)  3. BMI 40.0-44.9, adult [facility]) Eduardo Butler is currently in the action stage of change. As such, his goal is to continue with weight loss efforts. He has agreed to the Category 3 Plan.   Meal planning was discussed. He will adhere to the plan 85-95%. Increase water to 64 oz, but do not exceed secondary to heart disease.   Exercise goals: As is.   Behavioral modification strategies: increasing lean protein intake, decreasing simple carbohydrates, increasing vegetables, increasing water intake, decreasing eating out, no skipping meals, meal planning and cooking strategies, and keeping healthy foods in the home.  Eduardo Butler has agreed to follow-up with our clinic in 4 weeks. He was informed of the importance of frequent follow-up visits to maximize his success with intensive lifestyle modifications for his multiple health conditions.   Objective:   Blood pressure 127/75, pulse (!) 52, temperature  (!) 97.5 F (36.4 C), height 5\' 6"  (1.676 m), weight 229 lb (103.9 kg), SpO2 96 %. Body mass index is 36.96 kg/m.  General: Cooperative, alert, well developed, in no acute distress. HEENT: Conjunctivae and lids unremarkable. Cardiovascular: Regular rhythm.  Lungs: Normal work of breathing. Neurologic: No focal deficits.   Lab Results  Component Value Date   CREATININE 0.90 11/17/2022   BUN 19 11/17/2022   NA 138 11/17/2022   K 3.6 11/17/2022   CL 97 (L) 11/17/2022   CO2 27 10/24/2022   Lab Results  Component Value Date   ALT 13 10/24/2022   AST 18 10/24/2022   ALKPHOS 64 10/24/2022   BILITOT 0.4 10/24/2022   No results found for: "HGBA1C" Lab Results  Component Value Date   INSULIN 8.2 10/24/2022   Lab Results  Component Value Date   TSH 1.454 ***Test methodology is 3rd generation TSH*** 06/26/2007   Lab Results  Component Value Date   CHOL 128 11/24/2021   HDL 47 11/24/2021   LDLCALC 82 11/24/2021   TRIG 131 11/24/2021   CHOLHDL 2.7 10/21/2020   Lab Results  Component Value Date   VD25OH 33.8 10/24/2022   Lab Results  Component Value Date   WBC 8.5 04/15/2020   HGB 12.2 (L) 11/17/2022   HCT 36.0 (L) 11/17/2022   MCV 86 04/15/2020   PLT 278 04/15/2020   No results found for: "IRON", "TIBC", "FERRITIN"  Attestation Statements:   Reviewed by clinician on day of visit: allergies, medications, problem list, medical history, surgical history, family history, social history, and  previous encounter notes.   Trude Mcburney, am acting as Energy manager for [employer], DO.  I have reviewed the above documentation for accuracy and completeness, and I agree with the above. -  ***

## 2023-04-17 DIAGNOSIS — Z7901 Long term (current) use of anticoagulants: Secondary | ICD-10-CM | POA: Diagnosis not present

## 2023-04-17 DIAGNOSIS — I4891 Unspecified atrial fibrillation: Secondary | ICD-10-CM | POA: Diagnosis not present

## 2023-04-20 ENCOUNTER — Other Ambulatory Visit: Payer: Self-pay | Admitting: Bariatrics

## 2023-04-20 DIAGNOSIS — E1169 Type 2 diabetes mellitus with other specified complication: Secondary | ICD-10-CM

## 2023-04-28 DIAGNOSIS — I4891 Unspecified atrial fibrillation: Secondary | ICD-10-CM | POA: Diagnosis not present

## 2023-04-28 DIAGNOSIS — Z7901 Long term (current) use of anticoagulants: Secondary | ICD-10-CM | POA: Diagnosis not present

## 2023-04-30 ENCOUNTER — Other Ambulatory Visit: Payer: Self-pay | Admitting: Cardiovascular Disease

## 2023-04-30 ENCOUNTER — Other Ambulatory Visit: Payer: Self-pay | Admitting: Cardiology

## 2023-05-02 ENCOUNTER — Ambulatory Visit: Payer: PPO | Admitting: Bariatrics

## 2023-05-10 DIAGNOSIS — G4733 Obstructive sleep apnea (adult) (pediatric): Secondary | ICD-10-CM | POA: Diagnosis not present

## 2023-05-11 ENCOUNTER — Ambulatory Visit (INDEPENDENT_AMBULATORY_CARE_PROVIDER_SITE_OTHER): Payer: PPO | Admitting: Bariatrics

## 2023-05-11 ENCOUNTER — Encounter: Payer: Self-pay | Admitting: Bariatrics

## 2023-05-11 VITALS — BP 120/66 | HR 46 | Ht 66.0 in | Wt 229.0 lb

## 2023-05-11 DIAGNOSIS — I4891 Unspecified atrial fibrillation: Secondary | ICD-10-CM | POA: Diagnosis not present

## 2023-05-11 DIAGNOSIS — E669 Obesity, unspecified: Secondary | ICD-10-CM

## 2023-05-11 DIAGNOSIS — E559 Vitamin D deficiency, unspecified: Secondary | ICD-10-CM | POA: Diagnosis not present

## 2023-05-11 DIAGNOSIS — Z6836 Body mass index (BMI) 36.0-36.9, adult: Secondary | ICD-10-CM | POA: Diagnosis not present

## 2023-05-11 DIAGNOSIS — E1169 Type 2 diabetes mellitus with other specified complication: Secondary | ICD-10-CM | POA: Diagnosis not present

## 2023-05-11 DIAGNOSIS — Z7985 Long-term (current) use of injectable non-insulin antidiabetic drugs: Secondary | ICD-10-CM

## 2023-05-11 DIAGNOSIS — Z7901 Long term (current) use of anticoagulants: Secondary | ICD-10-CM | POA: Diagnosis not present

## 2023-05-11 MED ORDER — ONDANSETRON HCL 4 MG PO TABS: 4.0000 mg | ORAL_TABLET | Freq: Three times a day (TID) | ORAL | 0 refills | Status: DC | PRN

## 2023-05-11 MED ORDER — OZEMPIC (0.25 OR 0.5 MG/DOSE) 2 MG/3ML ~~LOC~~ SOPN: PEN_INJECTOR | SUBCUTANEOUS | 0 refills | Status: DC

## 2023-05-11 MED ORDER — VITAMIN D (ERGOCALCIFEROL) 1.25 MG (50000 UNIT) PO CAPS: 50000.0000 [IU] | ORAL_CAPSULE | ORAL | 0 refills | Status: DC

## 2023-05-11 NOTE — Progress Notes (Signed)
WEIGHT SUMMARY AND BIOMETRICS  Weight Lost Since Last Visit: 0  Weight Gained Since Last Visit: 0   No data recorded Anthropometric Measurements Height: 5\' 6"  (1.676 m) Weight: 229 lb (103.9 kg) BMI (Calculated): 36.98 Weight at Last Visit: 229lb Weight Lost Since Last Visit: 0 Weight Gained Since Last Visit: 0 Starting Weight: 253lb Total Weight Loss (lbs): 24 lb (10.9 kg)   Body Composition  Body Fat %: 37.5 % Fat Mass (lbs): 86 lbs Muscle Mass (lbs): 136 lbs Visceral Fat Rating : 24   Other Clinical Data Fasting: no Labs: no Today's Visit #: 11 Starting Date: 10/24/22    OBESITY Shayon is here to discuss his progress with his obesity treatment plan along with follow-up of his obesity related diagnoses.     Nutrition Plan: the Category 3 plan - 50% adherence.  Current exercise: yard work and golf.  Interim History:  His weight remains the same.  Eating all of the food on the plan., Protein intake is as prescribed, Is not skipping meals, and Water intake is inadequate.  Pharmacotherapy: Dietrick is on Ozempic 0.25 mg SQ weekly Adverse side effects: Nausea  and some vomiting.  Hunger is moderately controlled.  Cravings are moderately controlled.  Assessment/Plan:   1. Vitamin D insufficiency Vitamin D Deficiency Vitamin D is not at goal of 50.  Most recent vitamin D level was 33. He is on  prescription ergocalciferol 50,000 IU weekly. Lab Results  Component Value Date   VD25OH 33.8 10/24/2022    Plan: Continue vitamin D.   2. Type 2 diabetes mellitus with other specified complication, without long-term current use of insulin (HCC)  HgbA1c is at goal.  CBGs: Not checking      Episodes of hypoglycemia: no Medication(s): Ozempic 0.25 mg SQ weekly  No results found for: "HGBA1C" Lab Results  Component Value Date   LDLCALC 82 11/24/2021   CREATININE 0.90 11/17/2022     Plan: Continue and refill Ozempic 0.25 mg SQ weekly Will continue  to take Zofran as needed.  Continue all other medications.  Will keep all carbohydrates low both sweets and starches.  Will continue exercise regimen to 30 to 60 minutes on most days of the week.  Aim for 7 to 9 hours of sleep nightly.  Eat more low glycemic index foods.    Generalized Obesity: Current BMI BMI (Calculated): 36.98   Pharmacotherapy Plan Continue and refill  Ozempic 0.25 mg SQ weekly  Goran is currently in the action stage of change. As such, his goal is to continue with weight loss efforts.  He has agreed to the Category 3 plan.  Exercise goals: Older adults should follow the adult guidelines. When older adults cannot meet the adult guidelines, they should be as physically active as their abilities and conditions will allow.   Behavioral modification strategies: increasing lean protein intake, meal planning , increase water intake, and better snacking choices.  Serafino has agreed to follow-up with our clinic in 4 weeks.      Objective:   VITALS: Per patient if applicable, see vitals. GENERAL: Alert and in no acute distress. CARDIOPULMONARY: No increased WOB. Speaking in clear sentences.  PSYCH: Pleasant and cooperative. Speech normal rate and rhythm. Affect is appropriate. Insight and judgement are appropriate. Attention is focused, linear, and appropriate.  NEURO: Oriented as arrived to appointment on time with no prompting.   Attestation Statements:   This was prepared with the assistance of Engineer, civil (consulting).  Occasional wrong-word or  sound-a-like substitutions may have occurred due to the inherent limitations of voice recognition software.   Corinna Capra, DO

## 2023-05-22 ENCOUNTER — Other Ambulatory Visit: Payer: Self-pay | Admitting: Cardiology

## 2023-05-25 DIAGNOSIS — Z7901 Long term (current) use of anticoagulants: Secondary | ICD-10-CM | POA: Diagnosis not present

## 2023-05-25 DIAGNOSIS — D692 Other nonthrombocytopenic purpura: Secondary | ICD-10-CM | POA: Diagnosis not present

## 2023-06-08 DIAGNOSIS — Z7901 Long term (current) use of anticoagulants: Secondary | ICD-10-CM | POA: Diagnosis not present

## 2023-06-08 DIAGNOSIS — Z86718 Personal history of other venous thrombosis and embolism: Secondary | ICD-10-CM | POA: Diagnosis not present

## 2023-06-09 DIAGNOSIS — G4733 Obstructive sleep apnea (adult) (pediatric): Secondary | ICD-10-CM | POA: Diagnosis not present

## 2023-06-12 ENCOUNTER — Telehealth: Payer: Self-pay | Admitting: Bariatrics

## 2023-06-12 ENCOUNTER — Encounter: Payer: Self-pay | Admitting: Bariatrics

## 2023-06-12 ENCOUNTER — Other Ambulatory Visit (INDEPENDENT_AMBULATORY_CARE_PROVIDER_SITE_OTHER): Payer: Self-pay | Admitting: Bariatrics

## 2023-06-12 DIAGNOSIS — E1169 Type 2 diabetes mellitus with other specified complication: Secondary | ICD-10-CM

## 2023-06-12 MED ORDER — OZEMPIC (0.25 OR 0.5 MG/DOSE) 2 MG/3ML ~~LOC~~ SOPN
PEN_INJECTOR | SUBCUTANEOUS | 0 refills | Status: DC
Start: 2023-06-12 — End: 2023-08-03

## 2023-06-12 NOTE — Telephone Encounter (Signed)
Patient wants Dr. Manson Passey to prescribe Ozempic .25 to [facility] at [place]. They have a 100 pack of needles that the patient wants. Please call pt asap at [phone].

## 2023-06-15 ENCOUNTER — Ambulatory Visit (INDEPENDENT_AMBULATORY_CARE_PROVIDER_SITE_OTHER): Payer: PPO | Admitting: Bariatrics

## 2023-06-15 ENCOUNTER — Encounter: Payer: Self-pay | Admitting: Bariatrics

## 2023-06-15 VITALS — BP 108/63 | HR 44 | Temp 97.4°F | Ht 66.0 in | Wt 225.0 lb

## 2023-06-15 DIAGNOSIS — E669 Obesity, unspecified: Secondary | ICD-10-CM | POA: Diagnosis not present

## 2023-06-15 DIAGNOSIS — Z7985 Long-term (current) use of injectable non-insulin antidiabetic drugs: Secondary | ICD-10-CM

## 2023-06-15 DIAGNOSIS — Z6836 Body mass index (BMI) 36.0-36.9, adult: Secondary | ICD-10-CM

## 2023-06-15 DIAGNOSIS — E782 Mixed hyperlipidemia: Secondary | ICD-10-CM

## 2023-06-15 DIAGNOSIS — E785 Hyperlipidemia, unspecified: Secondary | ICD-10-CM | POA: Diagnosis not present

## 2023-06-15 DIAGNOSIS — E1169 Type 2 diabetes mellitus with other specified complication: Secondary | ICD-10-CM | POA: Diagnosis not present

## 2023-06-15 NOTE — Progress Notes (Signed)
WEIGHT SUMMARY AND BIOMETRICS  Weight Lost Since Last Visit: 4lb  Vitals Temp: (!) 97.4 F (36.3 C) BP: 108/63 Pulse Rate: (!) 44 SpO2: 97 %   Anthropometric Measurements Height: 5\' 6"  (1.676 m) Weight: 225 lb (102.1 kg) BMI (Calculated): 36.33 Weight at Last Visit: 229lb Weight Lost Since Last Visit: 4lb Starting Weight: 253lb Total Weight Loss (lbs): 2 lb (0.907 kg)   Body Composition  Body Fat %: 36.7 % Fat Mass (lbs): 82.8 lbs Muscle Mass (lbs): 135.8 lbs Visceral Fat Rating : 24   Other Clinical Data Fasting: no Labs: no Today's Visit #: 12 Starting Date: 10/24/52    OBESITY Eduardo Butler is here to discuss his progress with his obesity treatment plan along with follow-up of his obesity related diagnoses.     Nutrition Plan: the Category 3 plan - 25% adherence.  Current exercise: yard work and Naval [employer]  Interim History:  He is down 4 lbs since his last wist.  Eating all of the food on the plan., Protein intake is as prescribed, Water intake is inadequate., and Denies polyphagia  Pharmacotherapy: Benz is on Ozempic 0.25 mg SQ weekly Adverse side effects: Nausea Hunger is moderately controlled.  Cravings are moderately controlled.  Assessment/Plan:   Type II Diabetes HgbA1c is at goal. Last A1c was 6.4 CBGs: Not checking      Episodes of hypoglycemia: no Medication(s): Ozempic 0.25 mg SQ weekly  No results found for: "HGBA1C" Lab Results  Component Value Date   LDLCALC 82 11/24/2021   CREATININE 0.90 11/17/2022    Plan: Continue and refill Ozempic 0.25 mg SQ weekly Continue all other medications.  Will keep all carbohydrates low both sweets and starches.  Will continue exercise regimen to 30 to 60 minutes on most days of the week.  Aim for 7 to 9 hours of sleep nightly.  Eat more low glycemic index foods.    Generalized Obesity: Current BMI BMI (Calculated): 36.33   Hyperlipidemia LDL is not at goal. Medication(s):  Crestor Cardiovascular risk factors: advanced age (older than 72 for men, 88 for women), diabetes mellitus, dyslipidemia, male gender, obesity (BMI >= 30 kg/m2), and sedentary lifestyle  Lab Results  Component Value Date   CHOL 128 11/24/2021   HDL 47 11/24/2021   LDLCALC 82 11/24/2021   TRIG 131 11/24/2021   CHOLHDL 2.7 10/21/2020   Lab Results  Component Value Date   ALT 13 10/24/2022   AST 18 10/24/2022   ALKPHOS 64 10/24/2022   BILITOT 0.4 10/24/2022   The ASCVD Risk score (Arnett DK, et al., 2019) failed to calculate for the following reasons:   The valid total cholesterol range is 130 to 320 mg/dL  Plan:  Continue statin.  Information sheet on healthy vs unhealthy fats.  Will avoid all trans fats.  Will read labels Will minimize saturated fats except the following: low fat meats in moderation, diary, and limited dark chocolate.  Increase Omega 3 in foods, and consider an Omega 3 supplement.   Pharmacotherapy Plan Continue and refill  Ozempic 0.25 mg SQ weekly  Eduardo Butler is currently in the action stage of change. As such, his goal is to continue with weight loss efforts.  He has agreed to the Category 3 plan.  Exercise goals: Older adults should do exercises that maintain or improve balance if they are at risk of falling.   Behavioral modification strategies: decreasing simple carbohydrates , meal planning , planning for success, increasing lower sugar fruits, get rid of junk  food in the home, decrease snacking , and keep healthy foods in the home.  Eduardo Butler has agreed to follow-up with our clinic in 4 weeks.    Objective:   VITALS: Per patient if applicable, see vitals. GENERAL: Alert and in no acute distress. CARDIOPULMONARY: No increased WOB. Speaking in clear sentences.  PSYCH: Pleasant and cooperative. Speech normal rate and rhythm. Affect is appropriate. Insight and judgement are appropriate. Attention is focused, linear, and appropriate.  NEURO: Oriented as arrived  to appointment on time with no prompting.   Attestation Statements:   This was prepared with the assistance of Engineer, civil (consulting).  Occasional wrong-word or sound-a-like substitutions may have occurred due to the inherent limitations of voice recognition software.   Corinna Capra, DO

## 2023-06-20 ENCOUNTER — Telehealth: Payer: Self-pay | Admitting: Bariatrics

## 2023-06-20 DIAGNOSIS — E1169 Type 2 diabetes mellitus with other specified complication: Secondary | ICD-10-CM

## 2023-06-20 NOTE — Telephone Encounter (Signed)
Patient called stating that he needs Ozempic Needles 32g at 4mm size. Patient would like a call back at the number on file.

## 2023-06-21 MED ORDER — BD PEN NEEDLE NANO 2ND GEN 32G X 4 MM MISC
0 refills | Status: DC
Start: 2023-06-21 — End: 2024-03-19

## 2023-07-11 DIAGNOSIS — Z7901 Long term (current) use of anticoagulants: Secondary | ICD-10-CM | POA: Diagnosis not present

## 2023-07-11 DIAGNOSIS — I4891 Unspecified atrial fibrillation: Secondary | ICD-10-CM | POA: Diagnosis not present

## 2023-07-25 ENCOUNTER — Ambulatory Visit: Payer: PPO | Admitting: Bariatrics

## 2023-07-26 DIAGNOSIS — G4733 Obstructive sleep apnea (adult) (pediatric): Secondary | ICD-10-CM | POA: Diagnosis not present

## 2023-08-01 DIAGNOSIS — I10 Essential (primary) hypertension: Secondary | ICD-10-CM | POA: Diagnosis not present

## 2023-08-01 DIAGNOSIS — E119 Type 2 diabetes mellitus without complications: Secondary | ICD-10-CM | POA: Diagnosis not present

## 2023-08-01 DIAGNOSIS — I4891 Unspecified atrial fibrillation: Secondary | ICD-10-CM | POA: Diagnosis not present

## 2023-08-01 DIAGNOSIS — Z7901 Long term (current) use of anticoagulants: Secondary | ICD-10-CM | POA: Diagnosis not present

## 2023-08-01 DIAGNOSIS — Z0000 Encounter for general adult medical examination without abnormal findings: Secondary | ICD-10-CM | POA: Diagnosis not present

## 2023-08-01 DIAGNOSIS — Z23 Encounter for immunization: Secondary | ICD-10-CM | POA: Diagnosis not present

## 2023-08-01 DIAGNOSIS — D6869 Other thrombophilia: Secondary | ICD-10-CM | POA: Diagnosis not present

## 2023-08-01 DIAGNOSIS — F419 Anxiety disorder, unspecified: Secondary | ICD-10-CM | POA: Diagnosis not present

## 2023-08-01 DIAGNOSIS — E1169 Type 2 diabetes mellitus with other specified complication: Secondary | ICD-10-CM | POA: Diagnosis not present

## 2023-08-01 DIAGNOSIS — E1151 Type 2 diabetes mellitus with diabetic peripheral angiopathy without gangrene: Secondary | ICD-10-CM | POA: Diagnosis not present

## 2023-08-01 DIAGNOSIS — I25810 Atherosclerosis of coronary artery bypass graft(s) without angina pectoris: Secondary | ICD-10-CM | POA: Diagnosis not present

## 2023-08-01 DIAGNOSIS — B351 Tinea unguium: Secondary | ICD-10-CM | POA: Diagnosis not present

## 2023-08-01 DIAGNOSIS — E782 Mixed hyperlipidemia: Secondary | ICD-10-CM | POA: Diagnosis not present

## 2023-08-03 ENCOUNTER — Ambulatory Visit (INDEPENDENT_AMBULATORY_CARE_PROVIDER_SITE_OTHER): Payer: PPO | Admitting: Nurse Practitioner

## 2023-08-03 ENCOUNTER — Encounter: Payer: Self-pay | Admitting: Nurse Practitioner

## 2023-08-03 ENCOUNTER — Ambulatory Visit: Payer: PPO | Admitting: Bariatrics

## 2023-08-03 VITALS — BP 113/67 | HR 49 | Temp 97.5°F | Ht 66.0 in | Wt 232.0 lb

## 2023-08-03 DIAGNOSIS — Z6837 Body mass index (BMI) 37.0-37.9, adult: Secondary | ICD-10-CM | POA: Diagnosis not present

## 2023-08-03 DIAGNOSIS — Z7985 Long-term (current) use of injectable non-insulin antidiabetic drugs: Secondary | ICD-10-CM | POA: Diagnosis not present

## 2023-08-03 DIAGNOSIS — E1169 Type 2 diabetes mellitus with other specified complication: Secondary | ICD-10-CM

## 2023-08-03 MED ORDER — OZEMPIC (0.25 OR 0.5 MG/DOSE) 2 MG/3ML ~~LOC~~ SOPN
PEN_INJECTOR | SUBCUTANEOUS | 0 refills | Status: DC
Start: 2023-08-03 — End: 2023-11-14

## 2023-08-03 NOTE — Progress Notes (Signed)
Office: [phone]  /  Fax: [phone]  WEIGHT SUMMARY AND BIOMETRICS  Weight Lost Since Last Visit: 0lb  Weight Gained Since Last Visit: 7lb   Vitals Temp: (!) 97.5 F (36.4 C) BP: 113/67 Pulse Rate: (!) 49 SpO2: 96 %   Anthropometric Measurements Height: 5\' 6"  (1.676 m) Weight: 232 lb (105.2 kg) BMI (Calculated): 37.46 Weight at Last Visit: 225lb Weight Lost Since Last Visit: 0lb Weight Gained Since Last Visit: 7lb Starting Weight: 253lb Total Weight Loss (lbs): 21 lb (9.526 kg)   Body Composition  Body Fat %: 40.5 % Fat Mass (lbs): 94 lbs Muscle Mass (lbs): 131.2 lbs Visceral Fat Rating : 26   Other Clinical Data Fasting: no Labs: no Today's Visit #: 13 Starting Date: 10/[birth year]     HPI  Chief Complaint: OBESITY  Eduardo Butler is here to discuss his progress with his obesity treatment plan. He is on the the Category 3 Plan and states he is following his eating plan approximately 25 % of the time. He states he is exercising 60 minutes 2 days per week.   Interval History:  Since last office visit he has gained 7 pounds.  He went on vacation since his last visit. He is eating 2-3 meals per day.   He is struggling with meeting protein and water goals.  He's having a hard time drinking or wanting to drink water.  He is playing golf and doing yard work to stay active.    BF:  cereal or oatmeal or gravy biscuit Snack:  none Lunch:  sandwich or leftovers Snack:  none Dinner:  protein, vegetable, bread and sometimes dessert Fruit:  2 per day Drinks:  1% milk     Pharmacotherapy for weight loss: He is not currently taking medications  for medical weight loss.     Bariatric surgery:  Patient has not had bariatric surgery.  Pharmacotherapy for DMT2:  He is currently taking Ozempic 0.25mg , Metformin 1,000 mg BID & actos 45mg .  Denies side effects.   Last A1c was 6.4 He is not checking BS at home.   Episodes of hypoglycemia: no On ARB, ASA 81mg  and statin.   Last eye exam:  2023-plans to schedule appt  No results found for: "HGBA1C" Lab Results  Component Value Date   LDLCALC 82 11/24/2021   CREATININE 0.90 11/17/2022       PHYSICAL EXAM:  Blood pressure 113/67, pulse (!) 49, temperature (!) 97.5 F (36.4 C), height 5\' 6"  (1.676 m), weight 232 lb (105.2 kg), SpO2 96%. Body mass index is 37.45 kg/m.  General: He is overweight, cooperative, alert, well developed, and in no acute distress. PSYCH: Has normal mood, affect and thought process.   Extremities: No edema.  Neurologic: No gross sensory or motor deficits. No tremors or fasciculations noted.    DIAGNOSTIC DATA REVIEWED:  BMET    Component Value Date/Time   NA 138 11/17/2022 0949   NA 141 10/24/2022 1202   K 3.6 11/17/2022 0949   CL 97 (L) 11/17/2022 0949   CO2 27 10/24/2022 1202   GLUCOSE 130 (H) 11/17/2022 0949   BUN 19 11/17/2022 0949   BUN 17 10/24/2022 1202   CREATININE 0.90 11/17/2022 0949   CREATININE 1.01 07/07/2016 1304   CALCIUM 9.0 10/24/2022 1202   GFRNONAA 87 04/15/2020 1641   GFRAA 100 04/15/2020 1641   No results found for: "HGBA1C" Lab Results  Component Value Date   INSULIN 8.2 10/24/2022    CBC    Component  Value Date/Time   WBC 8.5 04/15/2020 1641   WBC 11.2 (H) 06/12/2018 0428   RBC 4.21 04/15/2020 1641   RBC 4.45 06/12/2018 0428   HGB 12.2 (L) 11/17/2022 0949   HGB 11.7 (L) 04/15/2020 1641   HCT 36.0 (L) 11/17/2022 0949   HCT 36.1 (L) 04/15/2020 1641   PLT 278 04/15/2020 1641   MCV 86 04/15/2020 1641   MCH 27.8 04/15/2020 1641   MCH 28.1 06/12/2018 0428   MCHC 32.4 04/15/2020 1641   MCHC 32.4 06/12/2018 0428   RDW 13.8 04/15/2020 1641   Iron Studies No results found for: "IRON", "TIBC", "FERRITIN", "IRONPCTSAT" Lipid Panel     Component Value Date/Time   CHOL 128 11/24/2021 0000   CHOL 117 10/21/2020 1152   TRIG 131 11/24/2021 0000   HDL 47 11/24/2021 0000   HDL 44 10/21/2020 1152   CHOLHDL 2.7 10/21/2020 1152    CHOLHDL 3.6 06/26/2007 1510   VLDL 25 06/26/2007 1510   LDLCALC 82 11/24/2021 0000   LDLCALC 59 10/21/2020 1152   Hepatic Function Panel     Component Value Date/Time   PROT 6.5 10/24/2022 1202   ALBUMIN 4.3 10/24/2022 1202   AST 18 10/24/2022 1202   ALT 13 10/24/2022 1202   ALKPHOS 64 10/24/2022 1202   BILITOT 0.4 10/24/2022 1202   BILIDIR 0.16 10/21/2020 1152   IBILI 0.8 06/26/2007 1510    Nutritional Lab Results  Component Value Date   VD25OH 33.8 10/24/2022     ASSESSMENT AND PLAN  TREATMENT PLAN FOR OBESITY:  Recommended Dietary Goals  Eduardo Butler is currently in the action stage of change. As such, his goal is to continue weight management plan. He has agreed to keeping a food journal and adhering to recommended goals of 1500 calories and 90+ protein. I've asked him to track and will review at next visit.  Eating too many carbs and not enough protein  Behavioral Intervention  We discussed the following Behavioral Modification Strategies today: increasing lean protein intake, decreasing simple carbohydrates , increasing vegetables, increasing lower glycemic fruits, increasing fiber rich foods, avoiding skipping meals, increasing water intake, work on meal planning and preparation, continue to practice mindfulness when eating, and planning for success.  Additional resources provided today: NA  Recommended Physical Activity Goals  Eduardo Butler has been advised to work up to 150 minutes of moderate intensity aerobic activity a week and strengthening exercises 2-3 times per week for cardiovascular health, weight loss maintenance and preservation of muscle mass.   He has agreed to Think about ways to increase daily physical activity and overcoming barriers to exercise and Increase physical activity in their day and reduce sedentary time (increase NEAT).    ASSOCIATED CONDITIONS ADDRESSED TODAY  Action/Plan  Type 2 diabetes mellitus with other specified complication, without  long-term current use of insulin (HCC) -     Ozempic (0.25 or 0.5 MG/DOSE); INJECT 0.25MG  INTO THE SKIN ONCE WEEKLY  Dispense: 3 mL; Refill: 0  Good blood sugar control is important to decrease the likelihood of diabetic complications such as nephropathy, neuropathy, limb loss, blindness, coronary artery disease, and death. Intensive lifestyle modification including diet, exercise and weight loss are the first line of treatment for diabetes.    Morbid obesity (HCC)  BMI 37.0-37.9, adult         Return in about 4 weeks (around 08/31/2023).[name] He was informed of the importance of frequent follow up visits to maximize his success with intensive lifestyle modifications for his multiple health  conditions.   ATTESTASTION STATEMENTS:  Reviewed by clinician on day of visit: allergies, medications, problem list, medical history, surgical history, family history, social history, and previous encounter notes.     Theodis Sato. Donell Tomkins FNP-C

## 2023-08-10 ENCOUNTER — Ambulatory Visit
Admission: RE | Admit: 2023-08-10 | Discharge: 2023-08-10 | Disposition: A | Discharge status: Home / Self Care | Payer: PPO | Source: Ambulatory Visit | Attending: Family Medicine | Admitting: Family Medicine

## 2023-08-10 ENCOUNTER — Other Ambulatory Visit: Payer: Self-pay | Admitting: Family Medicine

## 2023-08-10 DIAGNOSIS — M545 Low back pain, unspecified: Secondary | ICD-10-CM

## 2023-08-10 DIAGNOSIS — Z7901 Long term (current) use of anticoagulants: Secondary | ICD-10-CM | POA: Diagnosis not present

## 2023-08-10 DIAGNOSIS — M47816 Spondylosis without myelopathy or radiculopathy, lumbar region: Secondary | ICD-10-CM | POA: Diagnosis not present

## 2023-08-14 ENCOUNTER — Other Ambulatory Visit: Payer: Self-pay | Admitting: Cardiovascular Disease

## 2023-08-16 ENCOUNTER — Other Ambulatory Visit: Payer: Self-pay

## 2023-08-16 MED ORDER — CARVEDILOL 6.25 MG PO TABS: 6.2500 mg | ORAL_TABLET | Freq: Two times a day (BID) | ORAL | 1 refills | Status: DC

## 2023-08-16 NOTE — Telephone Encounter (Signed)
Pt's medication was sent to pt's pharmacy as requested. Confirmation received.  °

## 2023-08-24 DIAGNOSIS — M5011 Cervical disc disorder with radiculopathy,  high cervical region: Secondary | ICD-10-CM | POA: Diagnosis not present

## 2023-08-24 DIAGNOSIS — M9901 Segmental and somatic dysfunction of cervical region: Secondary | ICD-10-CM | POA: Diagnosis not present

## 2023-08-30 DIAGNOSIS — M5011 Cervical disc disorder with radiculopathy,  high cervical region: Secondary | ICD-10-CM | POA: Diagnosis not present

## 2023-08-30 DIAGNOSIS — M9901 Segmental and somatic dysfunction of cervical region: Secondary | ICD-10-CM | POA: Diagnosis not present

## 2023-08-31 DIAGNOSIS — Z7901 Long term (current) use of anticoagulants: Secondary | ICD-10-CM | POA: Diagnosis not present

## 2023-08-31 DIAGNOSIS — I4891 Unspecified atrial fibrillation: Secondary | ICD-10-CM | POA: Diagnosis not present

## 2023-09-05 ENCOUNTER — Ambulatory Visit (INDEPENDENT_AMBULATORY_CARE_PROVIDER_SITE_OTHER): Payer: PPO | Admitting: Bariatrics

## 2023-09-05 ENCOUNTER — Encounter: Payer: Self-pay | Admitting: Bariatrics

## 2023-09-05 VITALS — BP 114/47 | HR 45 | Temp 97.5°F | Ht 66.0 in | Wt 223.0 lb

## 2023-09-05 DIAGNOSIS — Z7984 Long term (current) use of oral hypoglycemic drugs: Secondary | ICD-10-CM

## 2023-09-05 DIAGNOSIS — E1169 Type 2 diabetes mellitus with other specified complication: Secondary | ICD-10-CM

## 2023-09-05 DIAGNOSIS — I10 Essential (primary) hypertension: Secondary | ICD-10-CM | POA: Diagnosis not present

## 2023-09-05 DIAGNOSIS — Z7985 Long-term (current) use of injectable non-insulin antidiabetic drugs: Secondary | ICD-10-CM | POA: Diagnosis not present

## 2023-09-05 DIAGNOSIS — E559 Vitamin D deficiency, unspecified: Secondary | ICD-10-CM | POA: Diagnosis not present

## 2023-09-05 DIAGNOSIS — Z6836 Body mass index (BMI) 36.0-36.9, adult: Secondary | ICD-10-CM | POA: Diagnosis not present

## 2023-09-05 DIAGNOSIS — E669 Obesity, unspecified: Secondary | ICD-10-CM | POA: Diagnosis not present

## 2023-09-05 MED ORDER — VITAMIN D (ERGOCALCIFEROL) 1.25 MG (50000 UNIT) PO CAPS
50000.0000 [IU] | ORAL_CAPSULE | ORAL | 0 refills | Status: DC
Start: 2023-09-05 — End: 2024-01-25

## 2023-09-05 NOTE — Progress Notes (Signed)
WEIGHT SUMMARY AND BIOMETRICS  Weight Lost Since Last Visit: 9lb  Weight Gained Since Last Visit: 0   Vitals Temp: (!) 97.5 F (36.4 C) BP: (!) 96/56 Pulse Rate: (!) 45 SpO2: 97 %   Anthropometric Measurements Height: 5\' 6"  (1.676 m) Weight: 223 lb (101.2 kg) BMI (Calculated): 36.01 Weight at Last Visit: 232lb Weight Lost Since Last Visit: 9lb Weight Gained Since Last Visit: 0 Starting Weight: 253lb Total Weight Loss (lbs): 30 lb (13.6 kg)   Body Composition  Body Fat %: 37.5 % Fat Mass (lbs): 83.8 lbs Muscle Mass (lbs): 132.8 lbs Visceral Fat Rating : 24   Other Clinical Data Fasting: no Labs: no Today's Visit #: 14 Starting Date: 10/24/22    OBESITY Melik is here to discuss his progress with his obesity treatment plan along with follow-up of his obesity related diagnoses.     Nutrition Plan: the Category 3 plan - 25% adherence.  Current exercise: yard work and Multimedia programmer.  Interim History:  He is down 9 lbs since his last visit.  Eating all of the food on the plan., Protein intake is as prescribed, and Water intake is inadequate.  Pharmacotherapy: Cordel is on Ozempic 0.25 mg SQ weekly Adverse side effects: Nausea Hunger is moderately controlled.  Cravings are moderately controlled.  Assessment/Plan:   1. Vitamin D insufficiency  Vitamin D is not at goal of 50.  Most recent vitamin D level was 33.8. He is on  prescription ergocalciferol 50,000 IU weekly. Lab Results  Component Value Date   VD25OH 33.8 10/24/2022    Plan: Refill prescription vitamin D 50,000 IU weekly.   2. Type 2 diabetes mellitus with other specified complication, without long-term current use of insulin (HCC) Type II Diabetes HgbA1c is at goal. Last A1c was 6.1 per patient.  CBGs: Not checking      Episodes of hypoglycemia: no Medication(s): Ozempic 0.25 mg SQ weekly He is also taking Metformin. He states that he is having more nausea, but no changes in his  medications.  He has a tendency toward nausea with the GLP-1 and has been unable to increase the dose.   No results found for: "HGBA1C" Lab Results  Component Value Date   LDLCALC 82 11/24/2021   CREATININE 0.90 11/17/2022   No results found for: "GFR"  Plan: Continue Ozempic 0.25 mg SQ weekly he will reduce his dose of metformin from 1000 twice daily to 500 twice daily Will keep all carbohydrates low both sweets and starches.  Will continue exercise regimen.  Eat more low glycemic index foods.   Hypertension Hypertension control uncertain. His initial blood pressure today was 96/56 , with a later reading of 114/47.  Medication(s): Coreg 6.25 mg twice daily and Losartan in the am. He states that he took his medication this a.m will continue  BP Readings from Last 3 Encounters:  09/05/23 (!) 114/47  08/03/23 113/67  06/15/23 108/63   Lab Results  Component Value Date   CREATININE 0.90 11/17/2022   CREATININE 0.98 10/24/2022   CREATININE 0.90 04/15/2020   No results found for: "GFR"  Plan: Continue his Coreg and will take 1/2 Tablet of the Losartan tomorrow.  Will check his blood pressure throughout the weekend, and if his blood pressure remains low he will take 1/2 tablet of the Coreg and 1/2 tablet of the Losartan.  He will continue to check his blood pressures and will record. He will follow-up with his cardiologist on Monday of this upcoming week No  added salt. Will keep sodium content to 1,500 mg or less per day.    Nausea:   He states that his nausea has been more pronounced over the last few weeks.  He has been taking his metformin without food.    Plan: He will reduce his metformin dose from 1000 mg twice daily to 500 mg twice daily.  He will take his Metformin only if he eats.  Generalized Obesity: Current BMI BMI (Calculated): 36.01   Pharmacotherapy Plan Continue  Ozempic 0.25 mg SQ weekly  [place] is currently in the action stage of change. As such, his goal is  to continue with weight loss efforts.  He has agreed to the Category 3 plan.  Exercise goals: Older adults should follow the adult guidelines. When older adults cannot meet the adult guidelines, they should be as physically active as their abilities and conditions will allow.   Behavioral modification strategies: increasing lean protein intake, decreasing simple carbohydrates , no meal skipping, increase water intake, and planning for success.  Kenley has agreed to follow-up with our clinic in 4 weeks.     Objective:   VITALS: Per patient if applicable, see vitals. GENERAL: Alert and in no acute distress. CARDIOPULMONARY: No increased WOB. Speaking in clear sentences.  PSYCH: Pleasant and cooperative. Speech normal rate and rhythm. Affect is appropriate. Insight and judgement are appropriate. Attention is focused, linear, and appropriate.  NEURO: Oriented as arrived to appointment on time with no prompting.   Attestation Statements:    This was prepared with the assistance of Engineer, civil (consulting).  Occasional wrong-word or sound-a-like substitutions may have occurred due to the inherent limitations of voice recognition software. Corinna Capra, DO

## 2023-09-07 DIAGNOSIS — M9901 Segmental and somatic dysfunction of cervical region: Secondary | ICD-10-CM | POA: Diagnosis not present

## 2023-09-07 DIAGNOSIS — M5011 Cervical disc disorder with radiculopathy,  high cervical region: Secondary | ICD-10-CM | POA: Diagnosis not present

## 2023-09-11 ENCOUNTER — Encounter: Payer: Self-pay | Admitting: Cardiovascular Disease

## 2023-09-11 ENCOUNTER — Ambulatory Visit: Payer: PPO | Attending: Cardiovascular Disease | Admitting: Cardiovascular Disease

## 2023-09-11 VITALS — BP 117/62 | HR 48 | Ht 66.0 in | Wt 229.2 lb

## 2023-09-11 DIAGNOSIS — Z951 Presence of aortocoronary bypass graft: Secondary | ICD-10-CM | POA: Diagnosis not present

## 2023-09-11 DIAGNOSIS — I25810 Atherosclerosis of coronary artery bypass graft(s) without angina pectoris: Secondary | ICD-10-CM | POA: Diagnosis not present

## 2023-09-11 DIAGNOSIS — E782 Mixed hyperlipidemia: Secondary | ICD-10-CM | POA: Diagnosis not present

## 2023-09-11 DIAGNOSIS — R072 Precordial pain: Secondary | ICD-10-CM

## 2023-09-11 DIAGNOSIS — I10 Essential (primary) hypertension: Secondary | ICD-10-CM

## 2023-09-11 DIAGNOSIS — I480 Paroxysmal atrial fibrillation: Secondary | ICD-10-CM | POA: Diagnosis not present

## 2023-09-11 DIAGNOSIS — R600 Localized edema: Secondary | ICD-10-CM | POA: Diagnosis not present

## 2023-09-11 DIAGNOSIS — G4733 Obstructive sleep apnea (adult) (pediatric): Secondary | ICD-10-CM

## 2023-09-11 MED ORDER — CARVEDILOL 6.25 MG PO TABS: 6.2500 mg | ORAL_TABLET | Freq: Two times a day (BID) | ORAL | Status: DC

## 2023-09-11 NOTE — Assessment & Plan Note (Signed)
History of obstructive sleep apnea on CPAP followed by Dr. Tresa Endo

## 2023-09-11 NOTE — Assessment & Plan Note (Signed)
History of morbid obesity having lost 30 pounds on Ozempic.

## 2023-09-11 NOTE — Assessment & Plan Note (Signed)
History of hyperlipidemia on Zetia and Crestor with lipid profile performed 08/01/2023 revealing total cholesterol 82, LDL of 36 and HDL of 34.

## 2023-09-11 NOTE — Patient Instructions (Addendum)
Medication Instructions:  Your physician recommends that you continue on your current medications as directed. Please refer to the Current Medication list given to you today.  *If you need a refill on your cardiac medications before your next appointment, please call your pharmacy*   Testing/Procedures: See below   Follow-Up: At [facility], you and your health needs are our priority.  As part of our continuing mission to provide you with exceptional heart care, we have created designated Provider Care Teams.  These Care Teams include your primary Cardiologist (physician) and Advanced Practice Providers (APPs -  Physician Assistants and Nurse Practitioners) who all work together to provide you with the care you need, when you need it.  We recommend signing up for the patient portal called "MyChart".  Sign up information is provided on this After Visit Summary.  MyChart is used to connect with patients for Virtual Visits (Telemedicine).  Patients are able to view lab/test results, encounter notes, upcoming appointments, etc.  Non-urgent messages can be sent to your provider as well.   To learn more about what you can do with MyChart, go to [email or web address].    Your next appointment:   6 month(s)  Provider:   Azalee Course, PA-C       Then, Nanetta Batty, MD will plan to see you again in 12 month(s).    Other Instructions How to Prepare for Your Cardiac PET/CT Stress Test:  1. Please do not take these medications before your test:   Medications that may interfere with the cardiac pharmacological stress agent (ex. nitrates - including erectile dysfunction medications, isosorbide mononitrate, tamulosin or beta-blockers) the day of the exam. (Erectile dysfunction medication should be held for at least 72 hrs prior to test) **no carvdilol (coreg)** Theophylline containing medications for 12 hours. Dipyridamole 48 hours prior to the test. Your remaining medications may be  taken with water. Hold furosemide (lasix) on the day of the procedure.  2. Nothing to eat or drink, except water, 3 hours prior to arrival time.   NO caffeine/decaffeinated products, or chocolate 12 hours prior to arrival.  3. NO perfume, cologne or lotion on chest or abdomen area.          4. Total time is 1 to 2 hours; you may want to bring reading material for the waiting time.  5. Please report to Radiology at the [facility] Main Entrance 30 minutes early for your test.  [address] [place], [place] [address]   Diabetic Preparation:  Hold oral medications. You may take NPH and Lantus insulin. Do not take Humalog or Humulin R (Regular Insulin) the day of your test. Check blood sugars prior to leaving the house. If able to eat breakfast prior to 3 hour fasting, you may take all medications, including your insulin, Do not worry if you miss your breakfast dose of insulin - start at your next meal. Patients who wear a continuous glucose monitor MUST remove the device prior to scanning.   In preparation for your appointment, medication and supplies will be purchased.  Appointment availability is limited, so if you need to cancel or reschedule, please call the Radiology Department at [phone] [facility]) OR [phone] [facility])  24 hours in advance to avoid a cancellation fee of $100.00  What to Expect After you Arrive:  Once you arrive and check in for your appointment, you will be taken to a preparation room within the Radiology Department.  A technologist or Nurse will obtain  your medical history, verify that you are correctly prepped for the exam, and explain the procedure.  Afterwards,  an IV will be started in your arm and electrodes will be placed on your skin for EKG monitoring during the stress portion of the exam. Then you will be escorted to the PET/CT scanner.  There, staff will get you positioned on the scanner and obtain a blood pressure and EKG.  During  the exam, you will continue to be connected to the EKG and blood pressure machines.  A small, safe amount of a radioactive tracer will be injected in your IV to obtain a series of pictures of your heart along with an injection of a stress agent.    After your Exam:  It is recommended that you eat a meal and drink a caffeinated beverage to counter act any effects of the stress agent.  Drink plenty of fluids for the remainder of the day and urinate frequently for the first couple of hours after the exam.  Your doctor will inform you of your test results within 7-10 business days.  For more information and frequently asked questions, please visit our website : [email or web address]  For questions about your test or how to prepare for your test, please call: Cardiac Imaging Nurse Navigators Office: [phone]

## 2023-09-11 NOTE — Addendum Note (Signed)
Addended by: Bernita Buffy on: 09/11/2023 12:07 PM   Modules accepted: Orders

## 2023-09-11 NOTE — Addendum Note (Signed)
Addended by: [name] on: 09/11/2023 11:56 AM   Modules accepted: Orders

## 2023-09-11 NOTE — Assessment & Plan Note (Signed)
History of CAD status post coronary artery bypass grafting times 29 January 2002 with a LIMA to his LAD, free radial to the circumflex marginal and sequential vein to the acute marginal of the right and distal RCA.  I last catheterized him 04/23/2020 revealing widely patent grafts and normal LV function.  He denies chest pain.

## 2023-09-11 NOTE — Assessment & Plan Note (Signed)
History of PAF maintaining sinus rhythm/sinus bradycardia on amiodarone and Coumadin followed by Dr. Elberta Fortis.  He did have successful DC cardioversion performed by Dr. Bjorn Pippin 11/17/2022 and has maintained sinus rhythm since that time.

## 2023-09-11 NOTE — Assessment & Plan Note (Signed)
Bilateral lower extreme edema which is 1 or 2+ today on furosemide 80 mg a day.  We did talk about adding 40 mg in the evening however he takes his medications with a day making twice daily dosing difficult.

## 2023-09-11 NOTE — Addendum Note (Signed)
Addended by: Bernita Buffy on: 09/11/2023 11:56 AM   Modules accepted: Orders

## 2023-09-11 NOTE — Assessment & Plan Note (Signed)
History of essential hypertension blood pressure measured today at 117/62.  He is on losartan and carvedilol both of which have been down titrated because of hypotension.

## 2023-09-11 NOTE — Progress Notes (Addendum)
09/11/2023 Eduardo Butler   [birth year]  [id number]  Primary Physician Eduardo Rua, MD Primary Cardiologist: Eduardo Gess MD FACP, [place], [place], [place]  HPI:  Eduardo Butler is a 72 y.o.  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 Clarene Butler follows. His other problems include hyperlipidemia and erectile dysfunction. I catheterized him at the [facility] September 28th , 2007 revealing patent grafts with normal LV function .  This suggested that he had a false positive Myoview stress test and medical therapy was recommended.  His last Myoview performed 01/04/13 was nonischemic Dr. Izola Butler follows his lipid profile. When I saw him 2 months ago he was complaining of dyspnea on exertion. He saw one of our PAs a month later with increasing dyspnea on exertion. His diastolic was cut in half but remains bradycardic although he was clinically improved. His when necessary hydrochlorothiazide was changed to when necessary Lasix which she took over the weekend for several doses which resulted in improved dyspnea and decreased edema. I did discuss with him today so restriction. A 2-D echo was ordered that showed normal FUNCTION in a Myoview stress test that showed apical thinning with new ischemia in the inferior wall compared to a previous Myoview 2 years ago. Since I saw him 3 months ago he continued to do well.  He says he is stronger.  I did adjust his diuretics which improved his lower extremity edema.  He also admits to dietary indiscretion.  He denies  chest pain does have chronic dyspnea on exertion.   He has seen Eduardo Course PA-C in the office 03/29/2020...  His major complaint is of dyspnea.  He has not lost any weight.  He denies chest pain.  An echo performed 03/07/2018 was completely normal.  He has been more dyspneic and weaker since I saw him back in December.  A Myoview stress test ordered by Eduardo Butler 04/08/2020 showed subtle anterior ischemia only mildly different from the last Myoview performed 7/17.  However, given the fact that his last cath was 14 years ago I decided to perform outpatient cardiac catheterization on 04/23/2020 revealing patent grafts.  2D echo revealed normal LV function.   He  was seen in the ER 09/13/2020 with dyspnea with negative work-up.  Chest x-ray showed no active disease.  He saw Dr. Izola Butler in the office on 10/16/2020 and an EKG on that date did show atrial fibrillation.  He remains on warfarin anticoagulation.  His hemoglobin was apparently in the mid 12 range.   He had an event monitor that showed predominantly sinus rhythm with PACs, PVCs, short runs of SVT and not as VT as well as runs of PAF with slow ventricular response.  He is on warfarin oral anticoagulation.  I referred him to Dr. Elberta Butler for evaluation of A-fib who discussed A-fib ablation versus dofetilide.  They decided to pursue weight reduction and evaluation for sleep apnea to begin with.  He feels much better in sinus rhythm.  He is on warfarin and maintains INR is in  therapeutic window.  I did refer him to Dr. Tresa Endo and he is currently using CPAP and feels somewhat clinically improved.  He is in A-fib today.  He does complain of persistent dyspnea.  He has been unable to lose weight on his own.  Since I saw him a year ago I did refer him to Dr. Elberta Butler.  He had successful outpatient cardioversion by Dr. Erroll Butler 11/18/2019 and was clinically improved after that.  He is on amiodarone.  He has been on Ozempic and is lost 30 pounds.  He says that over the last  several months when he walks he does get some chest pain rating to his neck and arms.   Current Meds  Medication Sig   acetaminophen (TYLENOL) 500 MG tablet Take 1,000-1,500 mg by mouth every 6 (six) hours as needed for moderate pain.   albuterol (VENTOLIN HFA) 108 (90 Base) MCG/ACT inhaler Inhale 2 puffs into the lungs every 6 (six) hours as needed for shortness of breath.    ALPRAZolam (XANAX) 0.5 MG tablet Take 0.25 mg by mouth at bedtime.   amiodarone (PACERONE) 200 MG tablet TAKE 1 TABLET(200 MG) BY MOUTH DAILY   ammonium lactate (LAC-HYDRIN) 12 % lotion Apply 1 application  topically daily.   aspirin EC 81 MG tablet Take 81 mg by mouth daily.   cetirizine (ZYRTEC) 10 MG tablet Take 1 tablet (10 mg total) by mouth once as needed for up to 1 dose for allergies or rhinitis. (Patient taking differently: Take 10 mg by mouth daily as needed for allergies or rhinitis.)   Elastic Bandages & Supports (MEDICAL COMPRESSION STOCKINGS) MISC Knee High 20-30 mm compression stockings   ezetimibe (ZETIA) 10 MG tablet Take 1 tablet (10 mg total) by mouth daily.   fluticasone (FLONASE) 50 MCG/ACT nasal spray Place 1 spray into both nostrils daily as needed for allergies or rhinitis.   folic acid (FOLVITE) 1 MG tablet Take 1 mg by mouth daily.   furosemide (LASIX) 80 MG tablet TAKE 1 TABLET(80 MG) BY MOUTH DAILY   HYDROcodone-acetaminophen (NORCO/VICODIN) 5-325 MG tablet Take 1 tablet by mouth every 6 (six) hours as needed (pain).   Insulin Pen Needle (BD PEN NEEDLE NANO 2ND GEN) 32G X 4 MM MISC BS BID   losartan (COZAAR) 25 MG tablet TAKE 1 TABLET(25 MG) BY MOUTH DAILY   metFORMIN (GLUCOPHAGE) 1000 MG tablet Take 500 mg by mouth 2 (two) times daily with a meal.   Misc Natural Products (IMMUNE FORMULA PO) Take 1 each by mouth daily. Immune: Vitamin C, D and Zinc. Chew 1 tablets daily.   omeprazole (PRILOSEC) 40 MG capsule TAKE ONE CAPSULE BY MOUTH DAILY AS NEEDED FOR HEARTBURN   ondansetron (ZOFRAN) 4 MG  tablet Take 1 tablet (4 mg total) by mouth every 8 (eight) hours as needed for nausea or vomiting.   pioglitazone (ACTOS) 45 MG tablet Take 45 mg by mouth daily.    rosuvastatin (CRESTOR) 20 MG tablet TAKE 1 TABLET(20 MG) BY MOUTH DAILY   Semaglutide,0.25 or 0.5MG /DOS, (OZEMPIC, 0.25 OR 0.5 MG/DOSE,) 2 MG/3ML SOPN INJECT 0.25MG  INTO THE SKIN ONCE WEEKLY   Vitamin D, Ergocalciferol, (DRISDOL) 1.25 MG (50000 UNIT) CAPS capsule Take 1 capsule (50,000 Units total) by mouth every 7 (seven) days.   warfarin (COUMADIN) 3 MG tablet Take 3 mg by mouth daily.     Allergies  Allergen Reactions   Morphine And Codeine     hallucinations      Social History   Socioeconomic  History   Marital status: Married    Spouse name: Not on file   Number of children: Not on file   Years of education: Not on file   Highest education level: Not on file  Occupational History   Not on file  Tobacco Use   Smoking status: Never   Smokeless tobacco: Never  Substance and Sexual Activity   Alcohol use: No    Comment: none since 1979   Drug use: No   Sexual activity: Not on file  Other Topics Concern   Not on file  Social History Narrative   Not on file   Social Determinants of Health   Financial Resource Strain: Low Risk  (04/07/2022)   Received from [facility], [facility]   Overall Financial Resource Strain (CARDIA)    Difficulty of Paying Living Expenses: Not very hard  Food Insecurity: Patient Declined (04/11/2023)   Received from [facility], [facility]   Hunger Vital Sign    Worried About Running Out of Food in the Last Year: Patient declined    Ran Out of Food in the Last Year: Patient declined  Transportation Needs: Patient Declined (04/11/2023)   Received from [facility], [facility]   [facility] - Transportation    Lack of Transportation (Medical): Patient declined    Lack of Transportation (Non-Medical): Patient declined  Physical Activity: Unknown (04/11/2023)   Received from  [facility], [facility]   Exercise Vital Sign    Days of Exercise per Week: Patient declined    Minutes of Exercise per Session: Not on file  Stress: Patient Declined (04/11/2023)   Received from [facility], [facility]   [employer] of Occupational Health - Occupational Stress Questionnaire    Feeling of Stress : Patient declined  Social Connections: Unknown (03/31/2022)   Received from [facility], [facility]   Social Network    Social Network: Not on file  Intimate Partner Violence: Unknown (04/09/2023)   Received from [facility], [facility]   HITS    Physically Hurt: Not on file    Insult or Talk Down To: Not on file    Threaten Physical Harm: Not on file    Scream or Curse: Not on file     Review of Systems: General: negative for chills, fever, night sweats or weight changes.  Cardiovascular: negative for chest pain, dyspnea on exertion, edema, orthopnea, palpitations, paroxysmal nocturnal dyspnea or shortness of breath Dermatological: negative for rash Respiratory: negative for cough or wheezing Urologic: negative for hematuria Abdominal: negative for nausea, vomiting, diarrhea, bright red blood per rectum, melena, or hematemesis Neurologic: negative for visual changes, syncope, or dizziness All other systems reviewed and are otherwise negative except as noted above.    Blood pressure 117/62, pulse (!) 48, height 5\' 6"  (1.676 m), weight 229 lb 3.2 oz (104 kg), SpO2 90%.  General appearance: alert and no distress Neck: no adenopathy, no carotid bruit, no JVD, supple, symmetrical, trachea midline, and thyroid not enlarged, symmetric, no tenderness/mass/nodules Lungs: clear to auscultation bilaterally Heart: regular rate and rhythm, S1, S2 normal, no murmur, click, rub or gallop Extremities: 1-2+ pitting edema bilaterally Pulses: 2+ and symmetric Skin: Skin color, texture, turgor normal. No rashes or lesions Neurologic: Grossly normal  EKG EKG  Interpretation Date/Time:  Monday September 11 2023 11:14:07 EDT Ventricular Rate:  48 PR Interval:  180 QRS Duration:  102 QT Interval:  366 QTC Calculation: 326 R Axis:   139  Text Interpretation: Sinus bradycardia Right  axis deviation Low voltage QRS Nonspecific ST and T wave abnormality When compared with ECG of 17-Nov-2022 11:10, QRS axis Shifted right Nonspecific T wave abnormality now evident in Inferior leads Nonspecific T wave abnormality, worse in Anterolateral leads QT has shortened Confirmed by Nanetta Batty [phone]) on 09/11/2023 11:25:01 AM    ASSESSMENT AND PLAN:   Hyperlipidemia History of hyperlipidemia on Zetia and Crestor with lipid profile performed 08/01/2023 revealing total cholesterol 82, LDL of 36 and HDL of 34.  Essential hypertension History of essential hypertension blood pressure measured today at 117/62.  He is on losartan and carvedilol both of which have been down titrated because of hypotension.  Coronary artery disease History of CAD status post coronary artery bypass grafting times 29 January 2002 with a LIMA to his LAD, free radial to the circumflex marginal and sequential vein to the acute marginal of the right and distal RCA.  I last catheterized him 04/23/2020 revealing widely patent grafts and normal LV function.  He denies chest pain.  Paroxysmal atrial fibrillation (HCC) History of PAF maintaining sinus rhythm/sinus bradycardia on amiodarone and Coumadin followed by Dr. Elberta Butler.  He did have successful DC cardioversion performed by Dr. Bjorn Pippin 11/17/2022 and has maintained sinus rhythm since that time.  Obstructive sleep apnea History of obstructive sleep apnea on CPAP followed by Dr. Tresa Endo.  Morbid obesity (HCC) History of morbid obesity having lost 30 pounds on Ozempic.  Bilateral lower extremity edema Bilateral lower extreme edema which is 1 or 2+ today on furosemide 80 mg a day.  We did talk about adding 40 mg in the evening however he takes his  medications with a day making twice daily dosing difficult.     Eduardo Gess MD FACP,FACC,FAHA, [facility] 09/11/2023 11:43 AM

## 2023-09-14 DIAGNOSIS — M9901 Segmental and somatic dysfunction of cervical region: Secondary | ICD-10-CM | POA: Diagnosis not present

## 2023-09-14 DIAGNOSIS — M5011 Cervical disc disorder with radiculopathy,  high cervical region: Secondary | ICD-10-CM | POA: Diagnosis not present

## 2023-09-21 DIAGNOSIS — Z7901 Long term (current) use of anticoagulants: Secondary | ICD-10-CM | POA: Diagnosis not present

## 2023-09-21 DIAGNOSIS — D6869 Other thrombophilia: Secondary | ICD-10-CM | POA: Diagnosis not present

## 2023-09-22 ENCOUNTER — Encounter (HOSPITAL_COMMUNITY): Payer: Self-pay

## 2023-09-26 ENCOUNTER — Telehealth (HOSPITAL_COMMUNITY): Payer: Self-pay | Admitting: *Deleted

## 2023-09-26 NOTE — Telephone Encounter (Addendum)
Reaching out to patient to offer assistance regarding upcoming cardiac imaging study; pt verbalizes understanding of appt date/time, parking situation and where to check in, pre-test NPO status and medications ordered, and verified current allergies; name and call back number provided for further questions should they arise Eduardo Frame RN Navigator Cardiac Imaging [facility] [phone] office [phone] cell  Patient aware to avoid caffeine 12 hours prior to test.

## 2023-09-26 NOTE — Telephone Encounter (Signed)
Attempted to call patient regarding upcoming cardiac PET appointment. Left message on voicemail with name and callback number [name] RN Navigator Cardiac Imaging [facility] [phone] Office  Advised patient to avoid caffeine for 12 hours prior.

## 2023-09-27 ENCOUNTER — Ambulatory Visit (HOSPITAL_COMMUNITY)
Admission: RE | Admit: 2023-09-27 | Discharge: 2023-09-27 | Disposition: A | Discharge status: Home / Self Care | Payer: PPO | Source: Ambulatory Visit | Attending: Cardiovascular Disease | Admitting: Cardiovascular Disease

## 2023-09-27 DIAGNOSIS — E782 Mixed hyperlipidemia: Secondary | ICD-10-CM | POA: Diagnosis not present

## 2023-09-27 DIAGNOSIS — Z951 Presence of aortocoronary bypass graft: Secondary | ICD-10-CM | POA: Insufficient documentation

## 2023-09-27 DIAGNOSIS — I25810 Atherosclerosis of coronary artery bypass graft(s) without angina pectoris: Secondary | ICD-10-CM | POA: Insufficient documentation

## 2023-09-27 DIAGNOSIS — R072 Precordial pain: Secondary | ICD-10-CM | POA: Diagnosis not present

## 2023-09-27 DIAGNOSIS — I10 Essential (primary) hypertension: Secondary | ICD-10-CM | POA: Insufficient documentation

## 2023-09-27 LAB — NM PET CT CARDIAC PERFUSION MULTI W/ABSOLUTE BLOODFLOW
LV dias vol: 166 mL (ref 62–150)
Nuc Rest EF: 45 %
Nuc Stress EF: 55 %
Peak HR: 63 {beats}/min
Rest HR: 50 {beats}/min
Rest Nuclear Isotope Dose: 26.9 mCi
Rest perfusion cavity size (mL): 166 mL
ST Depression (mm): 0 mm
Stress Nuclear Isotope Dose: 26.9 mCi
Stress perfusion cavity size (mL): 179 mL
TID: 0.9

## 2023-09-27 MED ORDER — RUBIDIUM RB82 GENERATOR (RUBYFILL)
26.9500 | PACK | Freq: Once | INTRAVENOUS | Status: AC
  Administered 2023-09-27: 26.95 via INTRAVENOUS

## 2023-09-27 MED ORDER — RUBIDIUM RB82 GENERATOR (RUBYFILL)
26.9400 | PACK | Freq: Once | INTRAVENOUS | Status: AC
  Administered 2023-09-27: 26.94 via INTRAVENOUS

## 2023-09-27 MED ORDER — REGADENOSON 0.4 MG/5ML IV SOLN
INTRAVENOUS | Status: AC
  Filled 2023-09-27: qty 5

## 2023-09-27 MED ORDER — REGADENOSON 0.4 MG/5ML IV SOLN
0.4000 mg | Freq: Once | INTRAVENOUS | Status: AC
  Administered 2023-09-27: 0.4 mg via INTRAVENOUS

## 2023-10-05 DIAGNOSIS — M9901 Segmental and somatic dysfunction of cervical region: Secondary | ICD-10-CM | POA: Diagnosis not present

## 2023-10-05 DIAGNOSIS — M5011 Cervical disc disorder with radiculopathy,  high cervical region: Secondary | ICD-10-CM | POA: Diagnosis not present

## 2023-10-06 ENCOUNTER — Telehealth: Payer: Self-pay | Admitting: Cardiovascular Disease

## 2023-10-06 NOTE — Telephone Encounter (Signed)
Called to relay results to patient. He has concerns about finding of  on his PET test . Findings are consistent with infarction. He reports he is having tightness in his chest, dizziness, SOB and weakness at times mostly on exertion. He is asking if these could be symptoms of the findings on his test? He deny having any symptoms at this time.   Low risk non ischemic study.  Findings are consistent with infarction.

## 2023-10-06 NOTE — Telephone Encounter (Signed)
Patient is requesting call back to discuss results of test.

## 2023-10-09 ENCOUNTER — Telehealth: Payer: Self-pay | Admitting: Cardiovascular Disease

## 2023-10-09 NOTE — Telephone Encounter (Signed)
Received message from Dr. Allyson Sabal:   This was a low risk study w/o evid of ischemia  ____________________________________________________________ Patient identification verified by 2 forms. Marilynn Rail, RN   Called and spoke to patient  Relayed provider message Patient verbalized understanding, no questions at this time

## 2023-10-09 NOTE — Telephone Encounter (Signed)
STAT if patient feels like he/she is going to faint   Are you dizzy now? No  Do you feel faint or have you passed out?   Do you have any other symptoms? Weakness  Have you checked your HR and BP (record if available)?   Patient stated when he gets up and starts to walk he will get dizzy. Patient states he has to be really still in order for the dizziness to go away. Please advise.

## 2023-10-09 NOTE — Telephone Encounter (Signed)
Pt returning call

## 2023-10-10 ENCOUNTER — Inpatient Hospital Stay (HOSPITAL_COMMUNITY): Admission: RE | Admit: 2023-10-10 | Payer: PPO | Source: Ambulatory Visit

## 2023-10-10 ENCOUNTER — Ambulatory Visit: Payer: PPO | Admitting: Bariatrics

## 2023-10-10 ENCOUNTER — Ambulatory Visit (INDEPENDENT_AMBULATORY_CARE_PROVIDER_SITE_OTHER): Payer: PPO | Admitting: Bariatrics

## 2023-10-10 ENCOUNTER — Encounter: Payer: Self-pay | Admitting: Bariatrics

## 2023-10-10 VITALS — BP 118/61 | HR 47 | Ht 66.0 in | Wt 233.0 lb

## 2023-10-10 DIAGNOSIS — R632 Polyphagia: Secondary | ICD-10-CM

## 2023-10-10 DIAGNOSIS — E669 Obesity, unspecified: Secondary | ICD-10-CM | POA: Diagnosis not present

## 2023-10-10 DIAGNOSIS — Z6837 Body mass index (BMI) 37.0-37.9, adult: Secondary | ICD-10-CM

## 2023-10-10 DIAGNOSIS — I480 Paroxysmal atrial fibrillation: Secondary | ICD-10-CM

## 2023-10-10 NOTE — Progress Notes (Signed)
WEIGHT SUMMARY AND BIOMETRICS  Weight Lost Since Last Visit: 0  Weight Gained Since Last Visit: 10lb   Vitals BP: 118/61 Pulse Rate: (!) 47 SpO2: 94 %   Anthropometric Measurements Height: 5\' 6"  (1.676 m) Weight: 233 lb (105.7 kg) BMI (Calculated): 37.63 Weight at Last Visit: 223lb Weight Lost Since Last Visit: 0 Weight Gained Since Last Visit: 10lb Starting Weight: 253lb Total Weight Loss (lbs): 20 lb (9.072 kg)   Body Composition  Body Fat %: 41.4 % Fat Mass (lbs): 96.8 lbs Muscle Mass (lbs): 130.2 lbs Visceral Fat Rating : 27   Other Clinical Data Fasting: no Labs: no Today's Visit #: 15 Starting Date: 10/24/22    OBESITY Michaela is here to discuss his progress with his obesity treatment plan along with follow-up of his obesity related diagnoses.    Nutrition Plan: the Category 3 plan - 25% adherence.  Current exercise: yard work and golf  Interim History:  He is up 10 lbs since his last visit. He has been off the Ozempic for 2 weeks.  Eating all of the food on the plan., Protein intake is as prescribed, Water intake is adequate., and Reports polyphagia   Pharmacotherapy: Jozsef has been on Qzempic, but currently on hold.  May resume in the future. Adverse side effects: Nausea Hunger is moderately controlled.  Cravings are moderately controlled.  Assessment/Plan:   Keshav Showman endorses excessive hunger.  Medication(s): Ozempic on hold.  Effects of medication:  moderately controlled. Cravings are moderately controlled.   Plan: Medication(s):Hold at this time.  Ozempic 0.25 mg SQ weekly Will increase water, protein and fiber to help assuage hunger.  Will minimize foods that have a high glucose index/load to minimize reactive hypoglycemia.   Paroxysmal atrial fibrillation:  He complains of not feeling well for several months with  weakness and dizziness.  He is being followed by a cardiologist and his PCP.  He has an history of atrial fibrillation.  He had a stress test on October 30.  He has a strong cardiac history with a history of multiple cardiac blockages 21 years ago.  He currently denies any chest pain or shortness of breath, although he states that he does get slightly short of breath when he walks in from the parking lot but this has not changed.  He has a history of bradycardia which has been consistent for months.  His pulse today was 47.   Plan: He will follow-up with cardiology tomorrow.  If he has any increased symptoms, chest pain, or shortness of breath then he will go directly to the emergency room.   Generalized Obesity: Current BMI BMI (Calculated): 37.63   Pharmacotherapy Plan Discontinue  Ozempic 0.25 mg SQ weekly he stopped Ozempic approximately 2 weeks ago because he thought that the medication might be causing some of his symptoms.  Since he has stopped I asked  him to not resume the medication until after he has his cardiac evaluation.  He will follow-up with his cardiologist tomorrow.   Irbin is currently in the action stage of change. As such, his goal is to continue with weight loss efforts.  He has agreed to the Category 3 plan.  Exercise goals: Older adults should follow the adult guidelines. When older adults cannot meet the adult guidelines, they should be as physically active as their abilities and conditions will allow.   Behavioral modification strategies: increasing lean protein intake, no meal skipping, meal planning , increase water intake, better snacking choices, planning for success, increasing vegetables, avoiding temptations, keep healthy foods in the home, and mindful eating.  Tylen has agreed to follow-up with our clinic in 4 weeks.   Objective:   VITALS: Per patient if applicable, see vitals. GENERAL: Alert and in no acute distress. CARDIOPULMONARY: No increased WOB. Speaking  in clear sentences.  Heart rate was 47 and regular.  No evidence of A-fib at this time.  Lungs were clear to auscultation. PSYCH: Pleasant and cooperative. Speech normal rate and rhythm. Affect is appropriate. Insight and judgement are appropriate. Attention is focused, linear, and appropriate.  NEURO: Oriented as arrived to appointment on time with no prompting.   Attestation Statements:     This was prepared with the assistance of Engineer, civil (consulting).  Occasional wrong-word or sound-a-like substitutions may have occurred due to the inherent limitations of voice recognition   Corinna Capra, DO

## 2023-10-10 NOTE — Progress Notes (Unsigned)
Cardiology Office Note:    Date:  10/11/2023   ID:  REXX MCCARSON, DOB [birth year], MRN [id number]  PCP:  Joycelyn Rua, MD  Cardiologist:  Nanetta Batty, MD  Electrophysiologist:  Regan Lemming, MD   Referring MD: Joycelyn Rua, MD   Chief Complaint  Patient presents with   Dizziness    History of Present Illness:    Eduardo Butler is a 72 y.o. 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  He follows with Dr. Allyson Sabal, last seen 09/11/2023.  He underwent stress PET 09/27/2023 which was low risk study, small fixed mid lateral wall defect consistent with prior infarct, no ischemia, rest EF 45% improved to 55% with stress.  He reports he has been having lightheadedness for months, worse over the last few weeks.  He denies any syncope.  States that when he gets up and moves around he feels dizzy.  Also has no energy, feels tired all the time.  Reports he has had near syncopal episodes but never passed out.  He denies any chest pain.  His PCP manages his INR, reports was 2.5 two weeks ago.  He denies any bleeding issues.   Past Medical History:  Diagnosis Date   Anxiety    Arthritis    CHF (congestive heart failure) (HCC)    Coronary artery disease    Diabetes (HCC)    H/O cardiac catheterization 08/25/2006   normal cath-patent grafts   H/O Doppler ultrasound 02/21/2011   lower ext.venous doppler,, no treatment except support stockings are recommended if clinically indicated   H/O echocardiogram 12/02/2010   EF 55% , no significant abnormalities   History of stress test 01/04/2013   Lexiscan Myoview ; scattered PVC's ; LV EF 54% ; Normal stress test    Hyperlipidemia    Hypertension    Obesity    Peroneal DVT (deep venous thrombosis) (HCC)     Shortness of breath     Past Surgical History:  Procedure Laterality Date   BYPASS GRAFT  2003   4 bypass    CARDIAC CATHETERIZATION  28-Feb-2002   S/P sudden cardiac death; three vessel coronary artery disease; preserved overall left ventricular systolic function with loculated mild to moderate anterolateral hypokinesis; no mitral regurgitation noted   CARDIOVERSION N/A 11/17/2022   Procedure: CARDIOVERSION;  Surgeon: Little Ishikawa, MD;  Location: [facility] ENDOSCOPY;  Service: Cardiovascular;  Laterality: N/A;   CORONARY ARTERY BYPASS GRAFT  2003   HERNIA REPAIR     LEFT HEART CATH AND CORS/GRAFTS ANGIOGRAPHY N/A 04/23/2020   Procedure: LEFT HEART CATH AND CORS/GRAFTS ANGIOGRAPHY;  Surgeon: [name], MD;  Location: [facility] INVASIVE CV LAB;  Service: Cardiovascular;  Laterality: N/A;   LUMBAR LAMINECTOMY/ DECOMPRESSION WITH MET-RX Left 07/08/2014   Procedure: Left Lumbar Four-Five Metrex foraminal microdiskectomy;  Surgeon: Lisbeth Renshaw, MD;  Location: [facility] NEURO ORS;  Service: Neurosurgery;  Laterality: Left;  Left Lumbar Four-Five Metrex foraminal microdiskectomy   PELVIC LAPAROSCOPY  2005   SHOULDER SURGERY     UMBILICAL HERNIA REPAIR  11/17/2011   Procedure: HERNIA REPAIR UMBILICAL ADULT;  Surgeon: Shelly Rubenstein, MD;  Location: [facility];  Service: General;  Laterality: N/A;  umbilical hernia repair with mesh    Current Medications: Current Meds  Medication Sig  acetaminophen (TYLENOL) 500 MG tablet Take 1,000-1,500 mg by mouth every 6 (six) hours as needed for moderate pain.   albuterol (VENTOLIN HFA) 108 (90 Base) MCG/ACT inhaler Inhale 2 puffs into the lungs every 6 (six) hours as needed for shortness of breath.    ALPRAZolam (XANAX) 0.5 MG tablet Take 0.25 mg by mouth at bedtime.   amiodarone (PACERONE) 200 MG tablet TAKE 1 TABLET(200 MG) BY MOUTH DAILY   ammonium lactate (LAC-HYDRIN) 12 % lotion Apply 1 application  topically daily.   aspirin EC  81 MG tablet Take 81 mg by mouth daily.   cetirizine (ZYRTEC) 10 MG tablet Take 1 tablet (10 mg total) by mouth once as needed for up to 1 dose for allergies or rhinitis. (Patient taking differently: Take 10 mg by mouth daily as needed for allergies or rhinitis.)   Elastic Bandages & Supports (MEDICAL COMPRESSION STOCKINGS) MISC Knee High 20-30 mm compression stockings   ezetimibe (ZETIA) 10 MG tablet Take 1 tablet (10 mg total) by mouth daily.   fluticasone (FLONASE) 50 MCG/ACT nasal spray Place 1 spray into both nostrils daily as needed for allergies or rhinitis.   folic acid (FOLVITE) 1 MG tablet Take 1 mg by mouth daily.   furosemide (LASIX) 80 MG tablet TAKE 1 TABLET(80 MG) BY MOUTH DAILY   HYDROcodone-acetaminophen (NORCO/VICODIN) 5-325 MG tablet Take 1 tablet by mouth every 6 (six) hours as needed (pain).   Insulin Pen Needle (BD PEN NEEDLE NANO 2ND GEN) 32G X 4 MM MISC BS BID   losartan (COZAAR) 25 MG tablet TAKE 1 TABLET(25 MG) BY MOUTH DAILY   metFORMIN (GLUCOPHAGE) 1000 MG tablet Take 500 mg by mouth 2 (two) times daily with a meal.   Misc Natural Products (IMMUNE FORMULA PO) Take 1 each by mouth daily. Immune: Vitamin C, D and Zinc. Chew 1 tablets daily.   omeprazole (PRILOSEC) 40 MG capsule TAKE ONE CAPSULE BY MOUTH DAILY AS NEEDED FOR HEARTBURN   ondansetron (ZOFRAN) 4 MG tablet Take 1 tablet (4 mg total) by mouth every 8 (eight) hours as needed for nausea or vomiting.   pioglitazone (ACTOS) 45 MG tablet Take 45 mg by mouth daily.    rosuvastatin (CRESTOR) 20 MG tablet TAKE 1 TABLET(20 MG) BY MOUTH DAILY   Semaglutide,0.25 or 0.5MG /DOS, (OZEMPIC, 0.25 OR 0.5 MG/DOSE,) 2 MG/3ML SOPN INJECT 0.25MG  INTO THE SKIN ONCE WEEKLY   Vitamin D, Ergocalciferol, (DRISDOL) 1.25 MG (50000 UNIT) CAPS capsule Take 1 capsule (50,000 Units total) by mouth every 7 (seven) days.   warfarin (COUMADIN) 3 MG tablet Take 3 mg by mouth daily.   [DISCONTINUED] carvedilol (COREG) 6.25 MG tablet Take 1 tablet  (6.25 mg total) by mouth 2 (two) times daily with a meal.     Allergies:   Morphine and codeine   Social History   Socioeconomic History   Marital status: Married    Spouse name: Not on file   Number of children: Not on file   Years of education: Not on file   Highest education level: Not on file  Occupational History   Not on file  Tobacco Use   Smoking status: Never   Smokeless tobacco: Never  Substance and Sexual Activity   Alcohol use: No    Comment: none since 1979   Drug use: No   Sexual activity: Not on file  Other Topics Concern   Not on file  Social History Narrative   Not on file   Social Determinants of Health  Financial Resource Strain: Low Risk  (04/07/2022)   Received from [facility], [facility]   Overall Financial Resource Strain (CARDIA)    Difficulty of Paying Living Expenses: Not very hard  Food Insecurity: Patient Declined (04/11/2023)   Received from [facility], [facility]   Hunger Vital Sign    Worried About Running Out of Food in the Last Year: Patient declined    Ran Out of Food in the Last Year: Patient declined  Transportation Needs: Patient Declined (04/11/2023)   Received from [facility], [facility]   [facility] - Transportation    Lack of Transportation (Medical): Patient declined    Lack of Transportation (Non-Medical): Patient declined  Physical Activity: Unknown (04/11/2023)   Received from [facility], [facility]   Exercise Vital Sign    Days of Exercise per Week: Patient declined    Minutes of Exercise per Session: Not on file  Stress: Patient Declined (04/11/2023)   Received from [facility], [facility]   [employer] [facility] [employer] [facility] - Occupational Stress Questionnaire    Feeling of Stress : Patient declined  Social Connections: Unknown (03/31/2022)   Received from [facility], [facility]   Social Network    Social Network: Not on file     Family History: The patient's family history  includes Diabetes in his brother and mother; Hypertension in his brother. There is no history of Heart attack or Stroke.  ROS:   Please see the history of present illness.     All other systems reviewed and are negative.  EKGs/Labs/Other Studies Reviewed:    The following studies were reviewed today:   EKG:   10/11/2023: Sinus bradycardia, rate 41, low voltage, QTc 450  Recent Labs: 10/24/2022: ALT 13 11/17/2022: BUN 19; Creatinine, Ser 0.90; Hemoglobin 12.2; Potassium 3.6; Sodium 138  Recent Lipid Panel    Component Value Date/Time   CHOL 128 11/24/2021 0000   CHOL 117 10/21/2020 1152   TRIG 131 11/24/2021 0000   HDL 47 11/24/2021 0000   HDL 44 10/21/2020 1152   CHOLHDL 2.7 10/21/2020 1152   CHOLHDL 3.6 06/26/2007 1510   VLDL 25 06/26/2007 1510   LDLCALC 82 11/24/2021 0000   LDLCALC 59 10/21/2020 1152    Physical Exam:    VS:  BP (!) 142/78 (BP Location: Left Arm, Patient Position: Sitting, Cuff Size: Large)   Pulse (!) 41   Ht 5\' 6"  (1.676 m)   Wt 242 lb (109.8 kg)   SpO2 93%   BMI 39.06 kg/m     Wt Readings from Last 3 Encounters:  10/11/23 242 lb (109.8 kg)  10/10/23 233 lb (105.7 kg)  09/11/23 229 lb 3.2 oz (104 kg)     GEN:  Well nourished, well developed in no acute distress HEENT: Normal NECK: No JVD; No carotid bruits CARDIAC: RRR, no murmurs, rubs, gallops RESPIRATORY:  Clear to auscultation without rales, wheezing or rhonchi  ABDOMEN: Soft, non-tender, non-distended MUSCULOSKELETAL:  No edema; No deformity  SKIN: Warm and dry NEUROLOGIC:  Alert and oriented x 3 PSYCHIATRIC:  Normal affect   ASSESSMENT:    1. Dizziness   2. Essential hypertension   3. Hyperlipidemia, unspecified hyperlipidemia type   4. Bradycardia   5. Coronary artery disease due to lipid rich plaque    PLAN:    Lightheadedness: Suspect due to bradycardia.  Heart rate 40bpm  in clinic today.  He is on amiodarone and carvedilol.   -Recommend stopping carvedilol -Check  Zio patch x  7 days -Check CMET, TSH, CBC  CAD: Status post CABG x 4 in 2023.  Stress PET 09/27/2023 was low risk study, fixed lateral wall defect but no ischemia  Chronic diastolic heart failure: Last echo in 2021 showed EF 55 to 60%, normal RV function, no significant valvular disease.  On Lasix 80 mg daily.  Check CMET, magnesium  Recurrent PE: Due to lupus anticoagulant.  On long-term warfarin.  Check CBC  Persistent atrial fibrillation: Currently maintaining sinus rhythm on amiodarone but with significant bradycardia as above.  Will stop carvedilol.  Can continue amiodarone, will check Zio patch x 7 days.  Check TSH, LFTs  Hypertension: On carvedilol 6.25 mg twice daily and Lasix 80 mg daily and losartan 25 mg daily.  Discontinue carvedilol due to bradycardia as above  Hyperlipidemia: On rosuvastatin 20 mg daily and Zetia 10 mg daily  Obesity: On Ozempic  T2DM: On Actos, Ozempic, metformin  RTC in 1 month  Medication Adjustments/Labs and Tests Ordered: Current medicines are reviewed at length with the patient today.  Concerns regarding medicines are outlined above.  Orders Placed This Encounter  Procedures   Comprehensive Metabolic Panel (CMET)   TSH   CBC w/Diff/Platelet   Magnesium   LONG TERM MONITOR (3-14 DAYS)   EKG 12-Lead   ECHOCARDIOGRAM COMPLETE   No orders of the defined types were placed in this encounter.   Patient Instructions  Medication Instructions:  Stop Coreg( carvedilol)  6.25 mg tablet Continue all current medications *If you need a refill on your cardiac medications before your next appointment, please call your pharmacy*   Lab Work: CMET, TSH, CBC Today If you have labs (blood work) drawn today and your tests are completely normal, you will receive your results only by: MyChart Message (if you have MyChart) OR A paper copy in the mail If you have any lab test that is abnormal or we need to change your treatment, we will call you to review the  results.   Testing/Procedures: Echo Your physician has requested that you have an echocardiogram. Echocardiography is a painless test that uses sound waves to create images of your heart. It provides your doctor with information about the size and shape of your heart and how well your heart's chambers and valves are working. This procedure takes approximately one hour. There are no restrictions for this procedure. Please do NOT wear cologne, perfume, aftershave, or lotions (deodorant is allowed). Please arrive 15 minutes prior to your appointment time.  Please note: We ask at that you not bring children with you during ultrasound (echo/ vascular) testing. Due to room size and safety concerns, children are not allowed in the ultrasound rooms during exams. Our front office staff cannot provide observation of children in our lobby area while testing is being conducted. An adult accompanying a patient to their appointment will only be allowed in the ultrasound room at the discretion of the ultrasound technician under special circumstances. We apologize for any inconvenience.      And   ZIO XT- Long Term Monitor Instructions  Your physician has requested you wear a ZIO patch monitor for 7 days.  This is a single patch monitor. [employer] supplies one patch monitor per enrollment. Additional stickers are not available. Please do not apply patch if you will be having a Nuclear Stress Test,  Echocardiogram, Cardiac CT, MRI, or Chest Xray during the period you would be wearing the  monitor. The patch cannot be worn during these tests. You cannot remove  and re-apply the  ZIO XT patch monitor.  Your ZIO patch monitor will be mailed 3 day USPS to your address on file. It may take 3-5 days  to receive your monitor after you have been enrolled.  Once you have received your monitor, please review the enclosed instructions. Your monitor  has already been registered assigning a specific monitor serial # to  you.  Billing and Patient Assistance Program Information  We have supplied [employer] with any of your insurance information on file for billing purposes. [employer] offers a sliding scale Patient Assistance Program for patients that do not have  insurance, or whose insurance does not completely cover the cost of the ZIO monitor.  You must apply for the Patient Assistance Program to qualify for this discounted rate.  To apply, please call [employer] at [phone], select option 4, select option 2, ask to apply for  Patient Assistance Program. Meredeth Ide will ask your household income, and how many people  are in your household. They will quote your out-of-pocket cost based on that information.  [employer] will also be able to set up a 45-month, interest-free payment plan if needed.  Applying the monitor   Shave hair from upper left chest.  Hold abrader disc by orange tab. Rub abrader in 40 strokes over the upper left chest as  indicated in your monitor instructions.  Clean area with 4 enclosed alcohol pads. Let dry.  Apply patch as indicated in monitor instructions. Patch will be placed under collarbone on left  side of chest with arrow pointing upward.  Rub patch adhesive wings for 2 minutes. Remove white label marked "1". Remove the white  label marked "2". Rub patch adhesive wings for 2 additional minutes.  While looking in a mirror, press and release button in center of patch. A small green light will  flash 3-4 times. This will be your only indicator that the monitor has been turned on.  Do not shower for the first 24 hours. You may shower after the first 24 hours.  Press the button if you feel a symptom. You will hear a small click. Record Date, Time and  Symptom in the Patient Logbook.  When you are ready to remove the patch, follow instructions on the last 2 pages of Patient  Logbook. Stick patch monitor onto the last page of Patient Logbook.  Place Patient Logbook in the blue and white box.  Use locking tab on box and tape box closed  securely. The blue and white box has prepaid postage on it. Please place it in the mailbox as  soon as possible. Your physician should have your test results approximately 7 days after the  monitor has been mailed back to [facility].  Call [facility] Customer Care at [phone] if you have questions regarding  your ZIO XT patch monitor. Call them immediately if you see an orange light blinking on your  monitor.  If your monitor falls off in less than 4 days, contact our Monitor department at [phone].  If your monitor becomes loose or falls off after 4 days call [employer] at [phone] for  suggestions on securing your monitor   Follow-Up: At [facility], you and your health needs are our priority.  As part of our continuing mission to provide you with exceptional heart care, we have created designated Provider Care Teams.  These Care Teams include your primary Cardiologist (physician) and Advanced Practice Providers (APPs -  Physician Assistants and Nurse Practitioners) who all work together  to provide you with the care you need, when you need it.  We recommend signing up for the patient portal called "MyChart".  Sign up information is provided on this After Visit Summary.  MyChart is used to connect with patients for Virtual Visits (Telemedicine).  Patients are able to view lab/test results, encounter notes, upcoming appointments, etc.  Non-urgent messages can be sent to your provider as well.   To learn more about what you can do with MyChart, go to [email or web address].    Your next appointment:   1 month(s) with APP or Dr. Allyson Sabal  Provider:   Nanetta Batty, MD       Signed, Little Ishikawa, MD  10/11/2023 3:04 PM    [facility]

## 2023-10-10 NOTE — Telephone Encounter (Signed)
Patient identification verified by 2 forms. Marilynn Rail, RN   Called and spoke to patient  Patient states:   -has dizziness sometimes when he gets up   -mainly happens when changing position from sitting to standing   -occurs 2-3x a day   -never goes a day without feeling dizzy   -last week he got so dizzy he started staggering and almost fell Patient denies:   -LOC/falls   -heart palpitation  Patient scheduled for OV 11/13 at 1:30pm with Dr. Bjorn Pippin  Reviewed strict ED warning signs/precautions  Patient verbalized understanding, no questions at this time

## 2023-10-11 ENCOUNTER — Encounter: Payer: Self-pay | Admitting: Cardiology

## 2023-10-11 ENCOUNTER — Ambulatory Visit: Payer: PPO | Attending: Cardiology | Admitting: Cardiology

## 2023-10-11 ENCOUNTER — Ambulatory Visit: Payer: PPO

## 2023-10-11 VITALS — BP 142/78 | HR 41 | Ht 66.0 in | Wt 242.0 lb

## 2023-10-11 DIAGNOSIS — R42 Dizziness and giddiness: Secondary | ICD-10-CM | POA: Diagnosis not present

## 2023-10-11 DIAGNOSIS — R001 Bradycardia, unspecified: Secondary | ICD-10-CM

## 2023-10-11 DIAGNOSIS — E785 Hyperlipidemia, unspecified: Secondary | ICD-10-CM | POA: Diagnosis not present

## 2023-10-11 DIAGNOSIS — I2510 Atherosclerotic heart disease of native coronary artery without angina pectoris: Secondary | ICD-10-CM | POA: Diagnosis not present

## 2023-10-11 DIAGNOSIS — I2583 Coronary atherosclerosis due to lipid rich plaque: Secondary | ICD-10-CM | POA: Diagnosis not present

## 2023-10-11 DIAGNOSIS — I10 Essential (primary) hypertension: Secondary | ICD-10-CM | POA: Diagnosis not present

## 2023-10-11 NOTE — Progress Notes (Unsigned)
Enrolled patient for a 7 day Zio XT monitor to be mailed to patients home.  

## 2023-10-11 NOTE — Patient Instructions (Addendum)
Medication Instructions:  Stop Coreg( carvedilol)  6.25 mg tablet Continue all current medications *If you need a refill on your cardiac medications before your next appointment, please call your pharmacy*   Lab Work: CMET, TSH, CBC Today If you have labs (blood work) drawn today and your tests are completely normal, you will receive your results only by: MyChart Message (if you have MyChart) OR A paper copy in the mail If you have any lab test that is abnormal or we need to change your treatment, we will call you to review the results.   Testing/Procedures: Echo Your physician has requested that you have an echocardiogram. Echocardiography is a painless test that uses sound waves to create images of your heart. It provides your doctor with information about the size and shape of your heart and how well your heart's chambers and valves are working. This procedure takes approximately one hour. There are no restrictions for this procedure. Please do NOT wear cologne, perfume, aftershave, or lotions (deodorant is allowed). Please arrive 15 minutes prior to your appointment time.  Please note: We ask at that you not bring children with you during ultrasound (echo/ vascular) testing. Due to room size and safety concerns, children are not allowed in the ultrasound rooms during exams. Our front office staff cannot provide observation of children in our lobby area while testing is being conducted. An adult accompanying a patient to their appointment will only be allowed in the ultrasound room at the discretion of the ultrasound technician under special circumstances. We apologize for any inconvenience.      And   ZIO XT- Long Term Monitor Instructions  Your physician has requested you wear a ZIO patch monitor for 7 days.  This is a single patch monitor. [employer] supplies one patch monitor per enrollment. Additional stickers are not available. Please do not apply patch if you will be having a  Nuclear Stress Test,  Echocardiogram, Cardiac CT, MRI, or Chest Xray during the period you would be wearing the  monitor. The patch cannot be worn during these tests. You cannot remove and re-apply the  ZIO XT patch monitor.  Your ZIO patch monitor will be mailed 3 day USPS to your address on file. It may take 3-5 days  to receive your monitor after you have been enrolled.  Once you have received your monitor, please review the enclosed instructions. Your monitor  has already been registered assigning a specific monitor serial # to you.  Billing and Patient Assistance Program Information  We have supplied [employer] with any of your insurance information on file for billing purposes. [employer] offers a sliding scale Patient Assistance Program for patients that do not have  insurance, or whose insurance does not completely cover the cost of the ZIO monitor.  You must apply for the Patient Assistance Program to qualify for this discounted rate.  To apply, please call [employer] at [phone], select option 4, select option 2, ask to apply for  Patient Assistance Program. Meredeth Ide will ask your household income, and how many people  are in your household. They will quote your out-of-pocket cost based on that information.  [employer] will also be able to set up a 17-month, interest-free payment plan if needed.  Applying the monitor   Shave hair from upper left chest.  Hold abrader disc by orange tab. Rub abrader in 40 strokes over the upper left chest as  indicated in your monitor instructions.  Clean area with 4 enclosed alcohol pads. Let dry.  Apply patch as indicated in monitor instructions. Patch will be placed under collarbone on left  side of chest with arrow pointing upward.  Rub patch adhesive wings for 2 minutes. Remove white label marked "1". Remove the white  label marked "2". Rub patch adhesive wings for 2 additional minutes.  While looking in a mirror, press and release button in center  of patch. A small green light will  flash 3-4 times. This will be your only indicator that the monitor has been turned on.  Do not shower for the first 24 hours. You may shower after the first 24 hours.  Press the button if you feel a symptom. You will hear a small click. Record Date, Time and  Symptom in the Patient Logbook.  When you are ready to remove the patch, follow instructions on the last 2 pages of Patient  Logbook. Stick patch monitor onto the last page of Patient Logbook.  Place Patient Logbook in the blue and white box. Use locking tab on box and tape box closed  securely. The blue and white box has prepaid postage on it. Please place it in the mailbox as  soon as possible. Your physician should have your test results approximately 7 days after the  monitor has been mailed back to [facility].  Call [facility] Customer Care at [phone] if you have questions regarding  your ZIO XT patch monitor. Call them immediately if you see an orange light blinking on your  monitor.  If your monitor falls off in less than 4 days, contact our Monitor department at [phone].  If your monitor becomes loose or falls off after 4 days call [employer] at [phone] for  suggestions on securing your monitor   Follow-Up: At [facility], you and your health needs are our priority.  As part of our continuing mission to provide you with exceptional heart care, we have created designated Provider Care Teams.  These Care Teams include your primary Cardiologist (physician) and Advanced Practice Providers (APPs -  Physician Assistants and Nurse Practitioners) who all work together to provide you with the care you need, when you need it.  We recommend signing up for the patient portal called "MyChart".  Sign up information is provided on this After Visit Summary.  MyChart is used to connect with patients for Virtual Visits (Telemedicine).  Patients are able to view lab/test results,  encounter notes, upcoming appointments, etc.  Non-urgent messages can be sent to your provider as well.   To learn more about what you can do with MyChart, go to [email or web address].    Your next appointment:   1 month(s) with APP or Dr. Allyson Sabal  Provider:   Nanetta Batty, MD

## 2023-10-12 ENCOUNTER — Inpatient Hospital Stay (HOSPITAL_COMMUNITY)
Admission: EM | Admit: 2023-10-12 | Discharge: 2023-10-21 | DRG: 286 | Disposition: A | Discharge status: Home / Self Care | Payer: PPO | Attending: Internal Medicine | Admitting: Internal Medicine

## 2023-10-12 ENCOUNTER — Encounter (HOSPITAL_COMMUNITY): Payer: Self-pay

## 2023-10-12 ENCOUNTER — Telehealth: Payer: Self-pay | Admitting: *Deleted

## 2023-10-12 ENCOUNTER — Other Ambulatory Visit: Payer: Self-pay

## 2023-10-12 ENCOUNTER — Emergency Department (HOSPITAL_COMMUNITY): Payer: PPO

## 2023-10-12 DIAGNOSIS — I272 Pulmonary hypertension, unspecified: Secondary | ICD-10-CM | POA: Diagnosis present

## 2023-10-12 DIAGNOSIS — E039 Hypothyroidism, unspecified: Secondary | ICD-10-CM | POA: Diagnosis present

## 2023-10-12 DIAGNOSIS — Z7901 Long term (current) use of anticoagulants: Secondary | ICD-10-CM

## 2023-10-12 DIAGNOSIS — R0609 Other forms of dyspnea: Secondary | ICD-10-CM | POA: Diagnosis not present

## 2023-10-12 DIAGNOSIS — T502X5A Adverse effect of carbonic-anhydrase inhibitors, benzothiadiazides and other diuretics, initial encounter: Secondary | ICD-10-CM | POA: Diagnosis present

## 2023-10-12 DIAGNOSIS — I5033 Acute on chronic diastolic (congestive) heart failure: Secondary | ICD-10-CM | POA: Diagnosis present

## 2023-10-12 DIAGNOSIS — I5031 Acute diastolic (congestive) heart failure: Secondary | ICD-10-CM | POA: Diagnosis not present

## 2023-10-12 DIAGNOSIS — Z885 Allergy status to narcotic agent status: Secondary | ICD-10-CM

## 2023-10-12 DIAGNOSIS — E1165 Type 2 diabetes mellitus with hyperglycemia: Secondary | ICD-10-CM

## 2023-10-12 DIAGNOSIS — G4733 Obstructive sleep apnea (adult) (pediatric): Secondary | ICD-10-CM | POA: Diagnosis not present

## 2023-10-12 DIAGNOSIS — Z86718 Personal history of other venous thrombosis and embolism: Secondary | ICD-10-CM

## 2023-10-12 DIAGNOSIS — D6862 Lupus anticoagulant syndrome: Secondary | ICD-10-CM | POA: Diagnosis not present

## 2023-10-12 DIAGNOSIS — I2510 Atherosclerotic heart disease of native coronary artery without angina pectoris: Secondary | ICD-10-CM | POA: Diagnosis not present

## 2023-10-12 DIAGNOSIS — E876 Hypokalemia: Secondary | ICD-10-CM | POA: Diagnosis not present

## 2023-10-12 DIAGNOSIS — Z86711 Personal history of pulmonary embolism: Secondary | ICD-10-CM

## 2023-10-12 DIAGNOSIS — Z833 Family history of diabetes mellitus: Secondary | ICD-10-CM

## 2023-10-12 DIAGNOSIS — E785 Hyperlipidemia, unspecified: Secondary | ICD-10-CM | POA: Diagnosis not present

## 2023-10-12 DIAGNOSIS — I110 Hypertensive heart disease with heart failure: Secondary | ICD-10-CM | POA: Diagnosis not present

## 2023-10-12 DIAGNOSIS — Z79899 Other long term (current) drug therapy: Secondary | ICD-10-CM

## 2023-10-12 DIAGNOSIS — D649 Anemia, unspecified: Principal | ICD-10-CM | POA: Diagnosis present

## 2023-10-12 DIAGNOSIS — E860 Dehydration: Secondary | ICD-10-CM | POA: Diagnosis not present

## 2023-10-12 DIAGNOSIS — E119 Type 2 diabetes mellitus without complications: Secondary | ICD-10-CM | POA: Diagnosis not present

## 2023-10-12 DIAGNOSIS — F419 Anxiety disorder, unspecified: Secondary | ICD-10-CM | POA: Diagnosis present

## 2023-10-12 DIAGNOSIS — Z7984 Long term (current) use of oral hypoglycemic drugs: Secondary | ICD-10-CM | POA: Diagnosis not present

## 2023-10-12 DIAGNOSIS — N179 Acute kidney failure, unspecified: Secondary | ICD-10-CM | POA: Diagnosis present

## 2023-10-12 DIAGNOSIS — Z8249 Family history of ischemic heart disease and other diseases of the circulatory system: Secondary | ICD-10-CM | POA: Diagnosis not present

## 2023-10-12 DIAGNOSIS — I4819 Other persistent atrial fibrillation: Secondary | ICD-10-CM | POA: Diagnosis present

## 2023-10-12 DIAGNOSIS — J9811 Atelectasis: Secondary | ICD-10-CM | POA: Diagnosis not present

## 2023-10-12 DIAGNOSIS — J90 Pleural effusion, not elsewhere classified: Secondary | ICD-10-CM | POA: Diagnosis not present

## 2023-10-12 DIAGNOSIS — D509 Iron deficiency anemia, unspecified: Secondary | ICD-10-CM | POA: Diagnosis not present

## 2023-10-12 DIAGNOSIS — R9431 Abnormal electrocardiogram [ECG] [EKG]: Secondary | ICD-10-CM | POA: Diagnosis not present

## 2023-10-12 DIAGNOSIS — Z7985 Long-term (current) use of injectable non-insulin antidiabetic drugs: Secondary | ICD-10-CM

## 2023-10-12 DIAGNOSIS — Z7982 Long term (current) use of aspirin: Secondary | ICD-10-CM

## 2023-10-12 DIAGNOSIS — D519 Vitamin B12 deficiency anemia, unspecified: Secondary | ICD-10-CM | POA: Diagnosis present

## 2023-10-12 DIAGNOSIS — R001 Bradycardia, unspecified: Secondary | ICD-10-CM | POA: Diagnosis not present

## 2023-10-12 DIAGNOSIS — I209 Angina pectoris, unspecified: Secondary | ICD-10-CM | POA: Diagnosis not present

## 2023-10-12 DIAGNOSIS — R079 Chest pain, unspecified: Secondary | ICD-10-CM | POA: Diagnosis not present

## 2023-10-12 DIAGNOSIS — R0789 Other chest pain: Secondary | ICD-10-CM | POA: Diagnosis not present

## 2023-10-12 DIAGNOSIS — I10 Essential (primary) hypertension: Secondary | ICD-10-CM | POA: Diagnosis present

## 2023-10-12 DIAGNOSIS — R0989 Other specified symptoms and signs involving the circulatory and respiratory systems: Secondary | ICD-10-CM | POA: Diagnosis not present

## 2023-10-12 DIAGNOSIS — Z951 Presence of aortocoronary bypass graft: Secondary | ICD-10-CM | POA: Diagnosis not present

## 2023-10-12 LAB — VITAMIN B12: Vitamin B-12: 169 pg/mL — ABNORMAL LOW (ref 180–914)

## 2023-10-12 LAB — BASIC METABOLIC PANEL
Anion gap: 9 (ref 5–15)
BUN: 24 mg/dL — ABNORMAL HIGH (ref 8–23)
CO2: 27 mmol/L (ref 22–32)
Calcium: 8.5 mg/dL — ABNORMAL LOW (ref 8.9–10.3)
Chloride: 101 mmol/L (ref 98–111)
Creatinine, Ser: 1.56 mg/dL — ABNORMAL HIGH (ref 0.61–1.24)
GFR, Estimated: 47 mL/min — ABNORMAL LOW (ref >60)
Glucose, Bld: 108 mg/dL — ABNORMAL HIGH (ref 70–99)
Potassium: 3.5 mmol/L (ref 3.5–5.1)
Sodium: 137 mmol/L (ref 135–145)

## 2023-10-12 LAB — COMPREHENSIVE METABOLIC PANEL
ALT: 27 [IU]/L (ref 0–44)
AST: 37 [IU]/L (ref 0–40)
Albumin: 4 g/dL (ref 3.8–4.8)
Alkaline Phosphatase: 94 [IU]/L (ref 44–121)
BUN/Creatinine Ratio: 13 (ref 10–24)
BUN: 20 mg/dL (ref 8–27)
Bilirubin Total: 0.7 mg/dL (ref 0.0–1.2)
CO2: 26 mmol/L (ref 20–29)
Calcium: 9 mg/dL (ref 8.6–10.2)
Chloride: 100 mmol/L (ref 96–106)
Creatinine, Ser: 1.52 mg/dL — ABNORMAL HIGH (ref 0.76–1.27)
Globulin, Total: 2 g/dL (ref 1.5–4.5)
Glucose: 67 mg/dL — ABNORMAL LOW (ref 70–99)
Potassium: 4.5 mmol/L (ref 3.5–5.2)
Sodium: 139 mmol/L (ref 134–144)
Total Protein: 6 g/dL (ref 6.0–8.5)
eGFR: 48 mL/min/{1.73_m2} — ABNORMAL LOW (ref >59)

## 2023-10-12 LAB — TYPE AND SCREEN
ABO/RH(D): A POS
Antibody Screen: NEGATIVE

## 2023-10-12 LAB — CBC WITH DIFFERENTIAL/PLATELET
Basophils Absolute: 0 10*3/uL (ref 0.0–0.2)
Basos: 1 %
EOS (ABSOLUTE): 0 10*3/uL (ref 0.0–0.4)
Eos: 1 %
Hematocrit: 29.2 % — ABNORMAL LOW (ref 37.5–51.0)
Hemoglobin: 8.9 g/dL — ABNORMAL LOW (ref 13.0–17.7)
Immature Grans (Abs): 0 10*3/uL (ref 0.0–0.1)
Immature Granulocytes: 0 %
Lymphocytes Absolute: 0.8 10*3/uL (ref 0.7–3.1)
Lymphs: 17 %
MCH: 28.1 pg (ref 26.6–33.0)
MCHC: 30.5 g/dL — ABNORMAL LOW (ref 31.5–35.7)
MCV: 92 fL (ref 79–97)
Monocytes Absolute: 0.7 10*3/uL (ref 0.1–0.9)
Monocytes: 16 %
Neutrophils Absolute: 3 10*3/uL (ref 1.4–7.0)
Neutrophils: 65 %
Platelets: 184 10*3/uL (ref 150–450)
RBC: 3.17 x10E6/uL — ABNORMAL LOW (ref 4.14–5.80)
RDW: 15 % (ref 11.6–15.4)
WBC: 4.5 10*3/uL (ref 3.4–10.8)

## 2023-10-12 LAB — RETICULOCYTES
Immature Retic Fract: 22 % — ABNORMAL HIGH (ref 2.3–15.9)
RBC.: 3.04 MIL/uL — ABNORMAL LOW (ref 4.22–5.81)
Retic Count, Absolute: 47.1 10*3/uL (ref 19.0–186.0)
Retic Ct Pct: 1.6 % (ref 0.4–3.1)

## 2023-10-12 LAB — CBC
HCT: 27.2 % — ABNORMAL LOW (ref 39.0–52.0)
Hemoglobin: 8.5 g/dL — ABNORMAL LOW (ref 13.0–17.0)
MCH: 27.9 pg (ref 26.0–34.0)
MCHC: 31.3 g/dL (ref 30.0–36.0)
MCV: 89.2 fL (ref 80.0–100.0)
Platelets: 182 10*3/uL (ref 150–400)
RBC: 3.05 MIL/uL — ABNORMAL LOW (ref 4.22–5.81)
RDW: 17.4 % — ABNORMAL HIGH (ref 11.5–15.5)
WBC: 5.5 10*3/uL (ref 4.0–10.5)
nRBC: 0 % (ref 0.0–0.2)

## 2023-10-12 LAB — T4, FREE: Free T4: 1.39 ng/dL — ABNORMAL HIGH (ref 0.61–1.12)

## 2023-10-12 LAB — TROPONIN I (HIGH SENSITIVITY)
Troponin I (High Sensitivity): 7 ng/L (ref <18)
Troponin I (High Sensitivity): 6 ng/L (ref <18)

## 2023-10-12 LAB — IRON AND TIBC
Iron: 47 ug/dL (ref 45–182)
Saturation Ratios: 13 % — ABNORMAL LOW (ref 17.9–39.5)
TIBC: 368 ug/dL (ref 250–450)
UIBC: 321 ug/dL

## 2023-10-12 LAB — TSH
TSH: 3.01 u[IU]/mL (ref 0.450–4.500)
TSH: 1.807 u[IU]/mL (ref 0.350–4.500)

## 2023-10-12 LAB — PROTIME-INR
INR: 2.4 — ABNORMAL HIGH (ref 0.8–1.2)
Prothrombin Time: 26.3 s — ABNORMAL HIGH (ref 11.4–15.2)

## 2023-10-12 LAB — GLUCOSE, CAPILLARY: Glucose-Capillary: 77 mg/dL (ref 70–99)

## 2023-10-12 LAB — POC OCCULT BLOOD, ED: Fecal Occult Bld: NEGATIVE

## 2023-10-12 LAB — HEMOGLOBIN A1C
Hgb A1c MFr Bld: 6.2 % — ABNORMAL HIGH (ref 4.8–5.6)
Mean Plasma Glucose: 131.24 mg/dL

## 2023-10-12 LAB — BRAIN NATRIURETIC PEPTIDE: B Natriuretic Peptide: 385.4 pg/mL — ABNORMAL HIGH (ref 0.0–100.0)

## 2023-10-12 LAB — FOLATE: Folate: 28.7 ng/mL (ref >5.9)

## 2023-10-12 LAB — FERRITIN: Ferritin: 54 ng/mL (ref 24–336)

## 2023-10-12 LAB — MAGNESIUM: Magnesium: 2 mg/dL (ref 1.6–2.3)

## 2023-10-12 MED ORDER — EZETIMIBE 10 MG PO TABS
10.0000 mg | ORAL_TABLET | Freq: Every day | ORAL | Status: DC
  Administered 2023-10-13 – 2023-10-21 (×9): 10 mg via ORAL
  Filled 2023-10-12 (×9): qty 1

## 2023-10-12 MED ORDER — INSULIN ASPART 100 UNIT/ML IJ SOLN
0.0000 [IU] | Freq: Three times a day (TID) | INTRAMUSCULAR | Status: DC
  Administered 2023-10-13: 1 [IU] via SUBCUTANEOUS
  Administered 2023-10-14: 2 [IU] via SUBCUTANEOUS
  Administered 2023-10-15: 1 [IU] via SUBCUTANEOUS
  Administered 2023-10-16: 2 [IU] via SUBCUTANEOUS
  Administered 2023-10-16 – 2023-10-18 (×3): 1 [IU] via SUBCUTANEOUS
  Administered 2023-10-18: 2 [IU] via SUBCUTANEOUS
  Administered 2023-10-19 (×2): 1 [IU] via SUBCUTANEOUS
  Administered 2023-10-20: 2 [IU] via SUBCUTANEOUS
  Administered 2023-10-21: 1 [IU] via SUBCUTANEOUS

## 2023-10-12 MED ORDER — BISACODYL 5 MG PO TBEC
5.0000 mg | DELAYED_RELEASE_TABLET | Freq: Every day | ORAL | Status: DC | PRN
  Administered 2023-10-16: 5 mg via ORAL
  Filled 2023-10-12: qty 1

## 2023-10-12 MED ORDER — FOLIC ACID 1 MG PO TABS
1.0000 mg | ORAL_TABLET | Freq: Every day | ORAL | Status: DC
  Administered 2023-10-12 – 2023-10-21 (×10): 1 mg via ORAL
  Filled 2023-10-12 (×10): qty 1

## 2023-10-12 MED ORDER — ASPIRIN 81 MG PO TBEC
81.0000 mg | DELAYED_RELEASE_TABLET | Freq: Every day | ORAL | Status: DC
  Administered 2023-10-13 – 2023-10-21 (×8): 81 mg via ORAL
  Filled 2023-10-12 (×8): qty 1

## 2023-10-12 MED ORDER — ACETAMINOPHEN 325 MG PO TABS: 650.0000 mg | ORAL_TABLET | Freq: Four times a day (QID) | ORAL | Status: DC | PRN

## 2023-10-12 MED ORDER — WARFARIN - PHARMACIST DOSING INPATIENT: Freq: Every day | Status: DC

## 2023-10-12 MED ORDER — FUROSEMIDE 10 MG/ML IJ SOLN
40.0000 mg | Freq: Once | INTRAMUSCULAR | Status: AC
  Administered 2023-10-12: 40 mg via INTRAVENOUS
  Filled 2023-10-12: qty 4

## 2023-10-12 MED ORDER — ACETAMINOPHEN 650 MG RE SUPP: 650.0000 mg | Freq: Four times a day (QID) | RECTAL | Status: DC | PRN

## 2023-10-12 MED ORDER — ROSUVASTATIN CALCIUM 20 MG PO TABS
20.0000 mg | ORAL_TABLET | Freq: Every day | ORAL | Status: DC
  Administered 2023-10-13 – 2023-10-21 (×9): 20 mg via ORAL
  Filled 2023-10-12 (×9): qty 1

## 2023-10-12 MED ORDER — ALPRAZOLAM 0.25 MG PO TABS
0.2500 mg | ORAL_TABLET | Freq: Every day | ORAL | Status: DC
  Administered 2023-10-12 – 2023-10-15 (×4): 0.5 mg via ORAL
  Filled 2023-10-12 (×4): qty 2

## 2023-10-12 MED ORDER — POLYETHYLENE GLYCOL 3350 17 G PO PACK
17.0000 g | PACK | Freq: Every day | ORAL | Status: DC
  Administered 2023-10-13 – 2023-10-21 (×6): 17 g via ORAL
  Filled 2023-10-12 (×8): qty 1

## 2023-10-12 MED ORDER — WARFARIN SODIUM 2 MG PO TABS
2.0000 mg | ORAL_TABLET | Freq: Once | ORAL | Status: DC
  Filled 2023-10-12: qty 1

## 2023-10-12 NOTE — ED Notes (Signed)
ED TO INPATIENT HANDOFF REPORT  ED Nurse Name and Phone #: Tori [phone]  S Name/Age/Gender Eduardo Butler 72 y.o. male Room/Bed: 011C/011C  Code Status   Code Status: Full Code  Home/SNF/Other Home Patient oriented to: self, place, time, and situation Is this baseline? Yes   Triage Complete: Triage complete  Chief Complaint Bradycardia [R00.1]  Triage Note Pt to ED recommendation from cardiology office, reports had apt yesterday, was called today to come to ED for further eval due to drop in hgb. Pt takes blood thinner, warfarin. Pt also c/o chest discomfort and SHOB x 3 months, worse with exertion. NAD in triage.    Allergies Allergies  Allergen Reactions   Morphine And Codeine     hallucinations      Level of Care/Admitting Diagnosis ED Disposition     ED Disposition  Admit   Condition  --   Comment  Hospital Area: [facility] [[id number]]  Level of Care: Telemetry Cardiac [103]  May admit patient to [facility] or [facility] if equivalent level of care is available:: No  Covid Evaluation: Asymptomatic - no recent exposure (last 10 days) testing not required  Diagnosis: Bradycardia [191850]  Admitting Physician: Leeroy Bock [[id number]]  Attending Physician: Leeroy Bock [[id number]]  Certification:: I certify this patient will need inpatient services for at least 2 midnights  Expected Medical Readiness: 10/15/2023          B Medical/Surgery History Past Medical History:  Diagnosis Date   Anxiety    Arthritis    CHF (congestive heart failure) (HCC)    Coronary artery disease    Diabetes (HCC)    H/O cardiac catheterization 08/25/2006   normal cath-patent grafts   H/O Doppler ultrasound 02/21/2011   lower ext.venous doppler,, no treatment except support stockings are recommended if clinically indicated   H/O echocardiogram 12/02/2010   EF 55% , no significant abnormalities   History of stress test 01/04/2013   Lexiscan Myoview  ; scattered PVC's ; LV EF 54% ; Normal stress test    Hyperlipidemia    Hypertension    Obesity    Peroneal DVT (deep venous thrombosis) (HCC)    Shortness of breath    Past Surgical History:  Procedure Laterality Date   BYPASS GRAFT  2003   4 bypass    CARDIAC CATHETERIZATION  03-02-02   S/P sudden cardiac death; three vessel coronary artery disease; preserved overall left ventricular systolic function with loculated mild to moderate anterolateral hypokinesis; no mitral regurgitation noted   CARDIOVERSION N/A 11/17/2022   Procedure: CARDIOVERSION;  Surgeon: Little Ishikawa, MD;  Location[facility] ENDOSCOPY;  Service: Cardiovascular;  Laterality: N/A;   CORONARY ARTERY BYPASS GRAFT  2003   HERNIA REPAIR     LEFT HEART CATH AND CORS/GRAFTS ANGIOGRAPHY N/A 04/23/2020   Procedure: LEFT HEART CATH AND CORS/GRAFTS ANGIOGRAPHY;  Surgeon: [name], MD;  Location: [facility] INVASIVE CV LAB;  Service: Cardiovascular;  Laterality: N/A;   LUMBAR LAMINECTOMY/ DECOMPRESSION WITH MET-RX Left 07/08/2014   Procedure: Left Lumbar Four-Five Metrex foraminal microdiskectomy;  Surgeon: Lisbeth Renshaw, MD;  Location: [facility] NEURO ORS;  Service: Neurosurgery;  Laterality: Left;  Left Lumbar Four-Five Metrex foraminal microdiskectomy   PELVIC LAPAROSCOPY  2005   SHOULDER SURGERY     UMBILICAL HERNIA REPAIR  11/17/2011   Procedure: HERNIA REPAIR UMBILICAL ADULT;  Surgeon: Shelly Rubenstein, MD;  Location: [facility];  Service: General;  Laterality: N/A;  umbilical hernia repair  with mesh     A IV Location/Drains/Wounds Patient Lines/Drains/Airways Status     Active Line/Drains/Airways     Name Placement date Placement time Site Days   Peripheral IV 10/12/23 20 G Left Antecubital 10/12/23  1154  Antecubital  less than 1            Intake/Output Last 24 hours  Intake/Output Summary (Last 24 hours) at 10/12/2023 1419 Last data filed at 10/12/2023 1343 Gross per 24 hour  Intake  --  Output 1000 ml  Net -1000 ml    Labs/Imaging Results for orders placed or performed during the hospital encounter of 10/12/23 (from the past 48 hour(s))  Basic metabolic panel     Status: Abnormal   Collection Time: 10/12/23 11:06 AM  Result Value Ref Range   Sodium 137 135 - 145 mmol/L   Potassium 3.5 3.5 - 5.1 mmol/L   Chloride 101 98 - 111 mmol/L   CO2 27 22 - 32 mmol/L   Glucose, Bld 108 (H) 70 - 99 mg/dL    Comment: Glucose reference range applies only to samples taken after fasting for at least 8 hours.   BUN 24 (H) 8 - 23 mg/dL   Creatinine, Ser 6.43 (H) 0.61 - 1.24 mg/dL   Calcium 8.5 (L) 8.9 - 10.3 mg/dL   GFR, Estimated 47 (L) >60 mL/min    Comment: (NOTE) Calculated using the CKD-EPI Creatinine Equation (2021)    Anion gap 9 5 - 15    Comment: Performed at [facility], [address], [place], [place] [address]  CBC     Status: Abnormal   Collection Time: 10/12/23 11:06 AM  Result Value Ref Range   WBC 5.5 4.0 - 10.5 K/uL   RBC 3.05 (L) 4.22 - 5.81 MIL/uL   Hemoglobin 8.5 (L) 13.0 - 17.0 g/dL   HCT 88.4 (L) 16.6 - 06.3 %   MCV 89.2 80.0 - 100.0 fL   MCH 27.9 26.0 - 34.0 pg   MCHC 31.3 30.0 - 36.0 g/dL   RDW 01.6 (H) 01.0 - 93.2 %   Platelets 182 150 - 400 K/uL   nRBC 0.0 0.0 - 0.2 %    Comment: Performed at [facility], [address], [place], [place] [address]  Troponin I (High Sensitivity)     Status: None   Collection Time: 10/12/23 11:06 AM  Result Value Ref Range   Troponin I (High Sensitivity) 7 <18 ng/L    Comment: (NOTE) Elevated high sensitivity troponin I (hsTnI) values and significant  changes across serial measurements may suggest ACS but many other  chronic and acute conditions are known to elevate hsTnI results.  Refer to the "Links" section for chest pain algorithms and additional  guidance. Performed at [facility], [address], [place], [place] [address]   Protime-INR     Status: Abnormal   Collection Time:  10/12/23 11:49 AM  Result Value Ref Range   Prothrombin Time 26.3 (H) 11.4 - 15.2 seconds   INR 2.4 (H) 0.8 - 1.2    Comment: (NOTE) INR goal varies based on device and disease states. Performed at [facility], [address], [place], [place] [address]   Type and screen [facility]     Status: None   Collection Time: 10/12/23 11:49 AM  Result Value Ref Range   ABO/RH(D) A POS    Antibody Screen NEG    Sample Expiration  10/15/2023,2359 Performed at [facility], [address], [place], [place] [address]   Brain natriuretic peptide     Status: Abnormal   Collection Time: 10/12/23 11:49 AM  Result Value Ref Range   B Natriuretic Peptide 385.4 (H) 0.0 - 100.0 pg/mL    Comment: Performed at [facility], [address], [place], [place] [address]  POC occult blood, ED     Status: None   Collection Time: 10/12/23  1:19 PM  Result Value Ref Range   Fecal Occult Bld NEGATIVE NEGATIVE   No results found.  Pending Labs Unresulted Labs (From admission, onward)     Start     Ordered   10/13/23 0500  Basic metabolic panel  Tomorrow morning,   R        10/12/23 1359   10/13/23 0500  CBC  Tomorrow morning,   R        10/12/23 1359            Vitals/Pain Today's Vitals   10/12/23 1101 10/12/23 1245 10/12/23 1330 10/12/23 1342  BP:  125/67 125/64   Pulse:  (!) 42 (!) 42   Resp:  18 17   Temp:    97.8 F (36.6 C)  TempSrc:    Oral  SpO2:  95% 98%   Weight: 109.8 kg     Height: 5\' 6"  (1.676 m)     PainSc: 0-No pain   0-No pain    Isolation Precautions No active isolations  Medications Medications  acetaminophen (TYLENOL) tablet 650 mg (has no administration in time range)    Or  acetaminophen (TYLENOL) suppository 650 mg (has no administration in time range)  polyethylene glycol (MIRALAX / GLYCOLAX) packet 17 g (has no administration in time range)  bisacodyl (DULCOLAX) EC tablet 5 mg (has no administration in time range)     Mobility walks     Focused Assessments Cardiac Assessment Handoff:    Lab Results  Component Value Date   CKTOTAL 105 06/27/2007   CKMB 2.1 06/27/2007   TROPONINI 0.01        NO INDICATION OF MYOCARDIAL INJURY. 06/27/2007   No results found for: "DDIMER" Does the Patient currently have chest pain? No    R Recommendations: See Admitting Provider Note  Report given to:   Additional Notes: axox4, sinus brady

## 2023-10-12 NOTE — Telephone Encounter (Signed)
Called left message to call back 

## 2023-10-12 NOTE — Telephone Encounter (Signed)
Little Ishikawa, MD 10/12/2023  8:48 AM EST Back to Top    Hgb has fallen significantly since last check, and also with AKI.  Concerned he is bleeding, as he is on warfarin.  He has been feeling lightheaded and was very bradycardic at appointment yesterday.  Discontinued his coreg.  Would recommend going to ED given his anemia, AKI, and bradycardia.  I tried calling patient but unable to reach.  Can we try and get in touch with him to let him know recommended to go to ED?

## 2023-10-12 NOTE — Progress Notes (Signed)
Spoke to patient and advised patient of Dr. Bjorn Pippin Recommendations. Patient stated he feels better and really did not want to go to ED. The patient reports his BP values this morning: BP  136/64 HR 48. Patient reports he's getting dress and heading to hospital.  Patient verbalizes understanding and will go to ED.

## 2023-10-12 NOTE — H&P (Signed)
History and Physical  Eduardo Butler OZH:[id number] DOB: 1951-04-22 DOA: 10/12/2023 PCP: Joycelyn Rua, MD  Chief Complaint: abnormal labs from cardiology appointment Historian: patient  HPI:  Eduardo Butler is a 72 y.o. male with a PMH significant for HLD, CAD, anxiety, PE on Coumadin, HTN, lupus anticoagulant positive, PAF, OSA, type 2 diabetes mellitus, obesity. At baseline, they live at home with their wife and are independent with ADLs.  They presented from home to the ED on 10/12/2023 due to the guidance of their cardiologist in setting of anemia and AKI on outpatient labs.   On labs drawn 11/14 he was seen to have AKI, hemoglobin 8 from baseline of 12.  He was also found to be bradycardic with heart rate in the 40s and told to hold his beta-blocker.  Patient has been experiencing generalized body fatigue without any focal weakness for several months.  He states that this condition has worsened significantly over the past 2 to 3 weeks.  Denies any incident at onset.  He is now struggling to exert himself even small distances such as between his vehicle and home.  He denies any chest pain or shortness of breath associated with this fatigue.  He states that he feels dizzy when he is exerting himself.  He has no dizziness at rest or with change in head position.  Denies any episodes of syncope or presyncope.  He has recently started on Ozempic but has not had any other recent medication modifications.  He started drinking Kool-Aid with Splenda additives with his water so has increased his fluid intake over the past couple of weeks but otherwise was not drinking hardly any water for the past several months.  He has chronic lower extremity edema for which he takes 80 mg furosemide daily and was held yesterday but did take this morning.  His lower extremity edema is slightly worse than baseline currently.  When he did not take his Lasix yesterday he had almost no urine output but has had 3 large voids  today.  He denies having hematochezia, hematemesis or any other known sources of bleeding.  He states that his last colonoscopy was approximately 15 years ago and was told that it was normal.  He held his beta-blocker today as instructed by his cardiologist.  He denies any palpitations or heart flutter sensations. S/p ablation last December for afib, remains on coumadin and endorses good adherence. Of note, he is lupus anticoagulant positive.   In the ED, it was found that they had HR in 40s, RR 18, blood pressure 125/67, SpO2 95% on room air.  Significant findings included: Na+ 137, K+ 3.5, glucose 108, creatinine 1.5 (baseline 0.9), hemoglobin 8.5 (8.9 yesterday and last reading was 10 months ago 12.2), WBC 5.5.  Troponin 7>6.  INR 2.4.  BNP 385.4.  Fecal occult blood test negative  They were initially treated with nothing in the ED.   Patient was admitted to medicine service for further workup and management of bradycardia, anemia, AKI, fatigue as outlined in detail below.  Assessment/Plan Principal Problem:   Bradycardia Active Problems:   Symptomatic anemia   AKI (acute kidney injury) (HCC)   Generalized Fatigue-likely multifactorial and has been going on for several months with worsening over the past couple of weeks.  Likely contributors are bradycardia, anemia, poor diet and dehydration.  Presents with symptoms consistent with hypothyroid.  History and exam is completely nonfocal so suspicion for stroke is very low.  -Thyroid panel - PT/OT -Treat underlying  causes  Bradycardia-heart rate in the 40s and can contribute to his fatigue and dizziness with exertion.  His carvedilol has been held for today. S/p ablation 10/2022 for afib. ECG shows sinus bradycardia 56HR -Continue to hold beta-blocker and amiodarone -Complete echo ordered -Cardiology consulted by EDP -Continuous telemetry  AKI- hypervolemic on exam with 3+ LE edema and pulmonary congestion with small left pleural  effusion. recently started ozempic and drinking kool-aid with splenda to increase his fluid intake. Baseline Cr 10 months ago was normal at 0.9. 1.5 on presentation today and in office yesterday.  - trial of IV lasix with close monitoring of his renal function. May need to switch to IV hydration if elevated more - strict I/O - hold additional renal toxic agents  Anemia- unknown origin. Denies known source of bleeding. Occult fecal negative. Baseline hgb around 12. 8.9 outpatient yesterday and 8.5 today. Thinks his last colonoscopy was in 2007 and was normal. Denies changes in stool habits. Hemodynamically stable currently.  INR 2.4 on admission - anemia panel - transfusion threshold <8 -Hold home Coumadin for possible occult bleeding source and consider if needs to be continued or can be transition to DOAC.  Also has history of PE and DVT with positive lupus anticoagulant -INR in the a.m.  HTN- blood pressure is well controlled currently. Did take morning dose of losartan at home - holding home losartan in setting of AKI - titrate back on antihypertensives if consistently elevated pressures. Does not need PRN blood pressure control  OSA- -CPAP nightly  Type II diabetes mellitus -Hemoglobin A1c pending -Sensitive sliding scale insulin -Hold home glycemic agents  CAD-history of CABG 2003.  No current chest pain -Continue aspirin, Zetia, rosuvastatin  Anxiety- chronic and not in acute flare - continue home anxiolytics PRN at bedtime   Past Medical History:  Diagnosis Date   Anxiety    Arthritis    CHF (congestive heart failure) (HCC)    Coronary artery disease    Diabetes (HCC)    H/O cardiac catheterization 08/25/2006   normal cath-patent grafts   H/O Doppler ultrasound 02/21/2011   lower ext.venous doppler,, no treatment except support stockings are recommended if clinically indicated   H/O echocardiogram 12/02/2010   EF 55% , no significant abnormalities   History of stress  test 01/04/2013   Lexiscan Myoview ; scattered PVC's ; LV EF 54% ; Normal stress test    Hyperlipidemia    Hypertension    Obesity    Peroneal DVT (deep venous thrombosis) (HCC)    Shortness of breath     Past Surgical History:  Procedure Laterality Date   BYPASS GRAFT  2003   4 bypass    CARDIAC CATHETERIZATION  2002-02-07   S/P sudden cardiac death; three vessel coronary artery disease; preserved overall left ventricular systolic function with loculated mild to moderate anterolateral hypokinesis; no mitral regurgitation noted   CARDIOVERSION N/A 11/17/2022   Procedure: CARDIOVERSION;  Surgeon: Little Ishikawa, MD;  Location: [facility] ENDOSCOPY;  Service: Cardiovascular;  Laterality: N/A;   CORONARY ARTERY BYPASS GRAFT  2003   HERNIA REPAIR     LEFT HEART CATH AND CORS/GRAFTS ANGIOGRAPHY N/A 04/23/2020   Procedure: LEFT HEART CATH AND CORS/GRAFTS ANGIOGRAPHY;  Surgeon: [name], MD;  Location: [facility] INVASIVE CV LAB;  Service: Cardiovascular;  Laterality: N/A;   LUMBAR LAMINECTOMY/ DECOMPRESSION WITH MET-RX Left 07/08/2014   Procedure: Left Lumbar Four-Five Metrex foraminal microdiskectomy;  Surgeon: Lisbeth Renshaw, MD;  Location: [facility] NEURO ORS;  Service:  Neurosurgery;  Laterality: Left;  Left Lumbar Four-Five Metrex foraminal microdiskectomy   PELVIC LAPAROSCOPY  2005   SHOULDER SURGERY     UMBILICAL HERNIA REPAIR  11/17/2011   Procedure: HERNIA REPAIR UMBILICAL ADULT;  Surgeon: Shelly Rubenstein, MD;  Location: [facility];  Service: General;  Laterality: N/A;  umbilical hernia repair with mesh     reports that he has never smoked. He has never used smokeless tobacco. He reports that he does not drink alcohol and does not use drugs.  Allergies  Allergen Reactions   Morphine And Codeine     hallucinations      Family History  Problem Relation Age of Onset   Diabetes Mother    Hypertension Brother    Diabetes Brother    Heart attack Neg Hx    Stroke  Neg Hx     Prior to Admission medications   Medication Sig Start Date End Date Taking? Authorizing Provider  acetaminophen (TYLENOL) 500 MG tablet Take 1,000-1,500 mg by mouth every 6 (six) hours as needed for moderate pain.   Yes [provider]  ALPRAZolam (XANAX) 0.5 MG tablet Take 0.25-0.5 mg by mouth at bedtime.   Yes [provider]  amiodarone (PACERONE) 200 MG tablet TAKE 1 TABLET(200 MG) BY MOUTH DAILY 02/13/23  Yes Camnitz, Will Daphine Deutscher, MD  ammonium lactate (LAC-HYDRIN) 12 % lotion Apply 1 application  topically as needed for dry skin. 12/27/21  Yes [provider]  aspirin EC 81 MG tablet Take 81 mg by mouth daily.   Yes [provider]  ezetimibe (ZETIA) 10 MG tablet Take 1 tablet (10 mg total) by mouth daily. 05/01/15  Yes [name], MD  folic acid (FOLVITE) 1 MG tablet Take 1 mg by mouth daily.   Yes [provider]  furosemide (LASIX) 80 MG tablet TAKE 1 TABLET(80 MG) BY MOUTH DAILY 08/15/23  Yes Lennette Bihari, MD  losartan (COZAAR) 25 MG tablet TAKE 1 TABLET(25 MG) BY MOUTH DAILY 05/01/23  Yes [name], MD  metFORMIN (GLUCOPHAGE) 1000 MG tablet Take 500 mg by mouth 2 (two) times daily with a meal.   Yes [provider]  Misc Natural Products (IMMUNE FORMULA PO) Take 1 each by mouth daily. Immune: Vitamin C, D and Zinc. Chew 1 tablets daily.   Yes [provider]  omeprazole (PRILOSEC) 40 MG capsule TAKE ONE CAPSULE BY MOUTH DAILY AS NEEDED FOR HEARTBURN 05/17/22  Yes [name], MD  pioglitazone (ACTOS) 45 MG tablet Take 45 mg by mouth daily.    Yes [provider]  rosuvastatin (CRESTOR) 20 MG tablet TAKE 1 TABLET(20 MG) BY MOUTH DAILY 05/01/23  Yes Camnitz, Will Daphine Deutscher, MD  albuterol (VENTOLIN HFA) 108 (90 Base) MCG/ACT inhaler Inhale 2 puffs into the lungs every 6 (six) hours as needed for shortness of breath.  Patient not taking: Reported on 10/12/2023 12/25/19   [provider]   Elastic Bandages & Supports (MEDICAL COMPRESSION STOCKINGS) MISC Knee High 20-30 mm compression stockings 03/02/18   [name], MD  fluticasone [facility]) 50 MCG/ACT nasal spray Place 1 spray into both nostrils daily as needed for allergies or rhinitis.    [provider]  HYDROcodone-acetaminophen (NORCO/VICODIN) 5-325 MG tablet Take 1 tablet by mouth every 6 (six) hours as needed (pain). 06/23/20   [provider]  Insulin Pen Needle (BD PEN NEEDLE NANO 2ND GEN) 32G X 4 MM MISC BS BID 06/21/23   Corinna Capra  A, DO  ondansetron (ZOFRAN) 4 MG tablet Take 1 tablet (4 mg total) by mouth every 8 (eight) hours as needed for nausea or vomiting. 05/11/23   Corinna Capra A, DO  Semaglutide,0.25 or 0.5MG /DOS, (OZEMPIC, 0.25 OR 0.5 MG/DOSE,) 2 MG/3ML SOPN INJECT 0.25MG  INTO THE SKIN ONCE WEEKLY 08/03/23   Irene Limbo, FNP  Vitamin D, Ergocalciferol, (DRISDOL) 1.25 MG (50000 UNIT) CAPS capsule Take 1 capsule (50,000 Units total) by mouth every 7 (seven) days. 09/05/23   Corinna Capra A, DO  warfarin (COUMADIN) 3 MG tablet Take 3 mg by mouth daily.    [provider]  warfarin (COUMADIN) 4 MG tablet Take 4 mg by mouth See admin instructions. Take Sunday and Tuesday and Thursday Patient not taking: Reported on 10/11/2023 07/17/19   [provider]  warfarin (COUMADIN) 5 MG tablet Take 5 mg by mouth See admin instructions. Take on Monday,  Wednesday, Friday and Saturday Patient not taking: Reported on 10/11/2023    [provider]   I have personally, briefly reviewed patient's prior medical records in [facility]  Objective: Blood pressure 125/64, pulse (!) 42, temperature 97.8 F (36.6 C), temperature source Oral, resp. rate 17, height 5\' 6"  (1.676 m), weight 109.8 kg, SpO2 98%.   Constitutional: NAD, calm, comfortable. Tired appearing, pale  HEENT: lids and conjunctivae normal. MMM. Posterior pharynx clear of any exudate or lesions. Normal  dentition.  Neck: normal, supple, no masses, no thyromegaly Respiratory: CTAB, no wheezing, no crackles. Normal respiratory effort. No accessory muscle use.  Cardiovascular: slow rate, regular rhythm. No murmur appreciated. Significant 3+ pitting edema to knees bilateral LE Abdomen: soft, NT, ND, no masses or HSM palpated. Musculoskeletal: No joint deformity upper and lower extremities. Skin: dry, flaking Neurologic: Alert and oriented x 3. Normal speech. Grossly non-focal exam. PERRL Psychiatric: Normal mood. Congruent affect.  Labs on Admission: I have personally reviewed admission labs and imaging studies  CBC    Component Value Date/Time   WBC 5.5 10/12/2023 1106   RBC 3.05 (L) 10/12/2023 1106   HGB 8.5 (L) 10/12/2023 1106   HGB 8.9 (L) 10/11/2023 1456   HCT 27.2 (L) 10/12/2023 1106   HCT 29.2 (L) 10/11/2023 1456   PLT 182 10/12/2023 1106   PLT 184 10/11/2023 1456   MCV 89.2 10/12/2023 1106   MCV 92 10/11/2023 1456   MCH 27.9 10/12/2023 1106   MCHC 31.3 10/12/2023 1106   RDW 17.4 (H) 10/12/2023 1106   RDW 15.0 10/11/2023 1456   LYMPHSABS 0.8 10/11/2023 1456   MONOABS 0.7 06/12/2018 0428   EOSABS 0.0 10/11/2023 1456   BASOSABS 0.0 10/11/2023 1456   CMP     Component Value Date/Time   NA 137 10/12/2023 1106   NA 139 10/11/2023 1456   K 3.5 10/12/2023 1106   CL 101 10/12/2023 1106   CO2 27 10/12/2023 1106   GLUCOSE 108 (H) 10/12/2023 1106   BUN 24 (H) 10/12/2023 1106   BUN 20 10/11/2023 1456   CREATININE 1.56 (H) 10/12/2023 1106   CREATININE 1.01 07/07/2016 1304   CALCIUM 8.5 (L) 10/12/2023 1106   PROT 6.0 10/11/2023 1456   ALBUMIN 4.0 10/11/2023 1456   AST 37 10/11/2023 1456   ALT 27 10/11/2023 1456   ALKPHOS 94 10/11/2023 1456   BILITOT 0.7 10/11/2023 1456   GFRNONAA 47 (L) 10/12/2023 1106   GFRAA 100 04/15/2020 1641    Radiological Exams on Admission: DG Chest 2 View  Result Date: 10/12/2023 CLINICAL DATA:  Chest pain. EXAM: CHEST - 2 VIEW COMPARISON:   01/07/2022 FINDINGS: Poor inspiration. Mildly enlarged cardiac silhouette without gross change. Mild pulmonary vascular congestion, accentuated by the poor inspiration. Small left pleural effusion. Small amount of linear density at the left lung base. Post CABG changes. No acute bony abnormality. IMPRESSION: 1. Poor inspiration. 2. Mild cardiomegaly and mild pulmonary vascular congestion. 3. Small left pleural effusion. 4. Small amount of left basilar atelectasis. Electronically Signed   By: Beckie Salts M.D.   On: 10/12/2023 15:43    EKG: Independently reviewed. Sinus bradycardia   DVT prophylaxis: SCDs Start: 10/12/23 1358   Code Status: full. Discussed with husband and wife and he endorses consistent with his wishes previously as discussed with his daughter   Family Communication: wife at bedside  Disposition Plan: admit to telemetry cardiac bed  Consults called: cardiology    Leeroy Bock, DO [facility]  10/12/2023, 4:22 PM    To contact the appropriate [facility] Attending or Consulting provider: Check [email or web address] for coverage from 7pm-7am

## 2023-10-12 NOTE — ED Triage Notes (Signed)
Pt to ED recommendation from cardiology office, reports had apt yesterday, was called today to come to ED for further eval due to drop in hgb. Pt takes blood thinner, warfarin. Pt also c/o chest discomfort and SHOB x 3 months, worse with exertion. NAD in triage.

## 2023-10-12 NOTE — ED Notes (Signed)
Pt states he had a large BM this morning and is refusing miralax at this time

## 2023-10-12 NOTE — Consult Note (Addendum)
Cardiology Consultation   Patient ID: Eduardo Butler MRN: [id number]; DOB: [birth year]  Admit date: 10/12/2023 Date of Consult: 10/13/2023  PCP:  Joycelyn Rua, MD   [facility] Providers Cardiologist:  Nanetta Batty, MD  Electrophysiologist:  Will Jorja Loa, MD  Sleep Medicine:  Nicki Guadalajara, MD      Cc: Abnormal labs.  Reason of consultation: Bradycardia  Referring physician: Dr. Anitra Lauth  Patient Profile:   Eduardo Butler is a 72 y.o. male with a hx of CAD status post CABG 2003 (LIMA to LAD, free radial to LCx, sequential vein graft to acute marginal and distal RCA, persistent atrial fibrillation, chronic HFpEF, recurrent pulmonary embolism positive lupus anticoagulant on Coumadin, hyperlipidemia, hypertension, non-insulin-dependent diabetes melitis type II who is being seen 10/13/2023 for the evaluation of bradycardia at the request of Dr. Anitra Lauth.  History of Present Illness:   Mr. Renna Caucasian gentleman with a complex cardiovascular history as outlined above presents with a chief complaint of abnormal labs.  Patient is accompanied by his wife at bedside.  Chest pain/shortness of breath: Ongoing for the last 6 months at least. He describes the pain as tightness like sensation in the chest and shoulder. Worse with effort related activities (i.e. taking garbage out) Intensity 5 out of 10. Improves with resting He had a CT/PET scan in October 2024 which was reported to be low risk, see report for additional details However, he states that the pain is worse over the last 2 or 3 weeks. He enjoys playing golf but notices getting winded quickly and his buddies have noted that it gives out quickly.  He was recently seen by one of my partners Dr. Bjorn Pippin on 10/11/2023 for lightheadedness.  Symptoms are usually brought on when he gets up, moves around, decreased energy, fatigue as well.  At times he reports near syncopal events.  But no syncope.  Patient was  recommended to stop carvedilol given the bradycardia.  The office staff reach out to him of the labs came back concerning for anemia.  His hemoglobin back in December 2023 was 12.2 g/dL and as of yesterday October 11, 2023 it was approximately 8.9 g/dL.  Patient does endorse dark/black color stools.  He was asked to come to the ED for further evaluation and management.  Currently he is sitting upright eating dinner in no chest pain at rest.  Past Medical History:  Diagnosis Date   Anxiety    Arthritis    CHF (congestive heart failure) (HCC)    Coronary artery disease    Diabetes (HCC)    H/O cardiac catheterization 08/25/2006   normal cath-patent grafts   H/O Doppler ultrasound 02/21/2011   lower ext.venous doppler,, no treatment except support stockings are recommended if clinically indicated   H/O echocardiogram 12/02/2010   EF 55% , no significant abnormalities   History of stress test 01/04/2013   Lexiscan Myoview ; scattered PVC's ; LV EF 54% ; Normal stress test    Hyperlipidemia    Hypertension    Obesity    Peroneal DVT (deep venous thrombosis) (HCC)    Shortness of breath     Past Surgical History:  Procedure Laterality Date   BYPASS GRAFT  2003   4 bypass    CARDIAC CATHETERIZATION  2002/02/06   S/P sudden cardiac death; three vessel coronary artery disease; preserved overall left ventricular systolic function with loculated mild to moderate anterolateral hypokinesis; no mitral regurgitation noted   CARDIOVERSION N/A 11/17/2022   Procedure: CARDIOVERSION;  Surgeon: Little Ishikawa, MD;  Location: [facility] ENDOSCOPY;  Service: Cardiovascular;  Laterality: N/A;   CORONARY ARTERY BYPASS GRAFT  2003   HERNIA REPAIR     LEFT HEART CATH AND CORS/GRAFTS ANGIOGRAPHY N/A 04/23/2020   Procedure: LEFT HEART CATH AND CORS/GRAFTS ANGIOGRAPHY;  Surgeon: [name], MD;  Location: [facility] INVASIVE CV LAB;  Service: Cardiovascular;  Laterality: N/A;   LUMBAR LAMINECTOMY/  DECOMPRESSION WITH MET-RX Left 07/08/2014   Procedure: Left Lumbar Four-Five Metrex foraminal microdiskectomy;  Surgeon: Lisbeth Renshaw, MD;  Location: [facility] NEURO ORS;  Service: Neurosurgery;  Laterality: Left;  Left Lumbar Four-Five Metrex foraminal microdiskectomy   PELVIC LAPAROSCOPY  2005   SHOULDER SURGERY     UMBILICAL HERNIA REPAIR  11/17/2011   Procedure: HERNIA REPAIR UMBILICAL ADULT;  Surgeon: Shelly Rubenstein, MD;  Location: [facility];  Service: General;  Laterality: N/A;  umbilical hernia repair with mesh     Home Medications:  Prior to Admission medications   Medication Sig Start Date End Date Taking? Authorizing Provider  acetaminophen (TYLENOL) 500 MG tablet Take 1,000-1,500 mg by mouth every 6 (six) hours as needed for moderate pain.   Yes [provider]  ALPRAZolam (XANAX) 0.5 MG tablet Take 0.25-0.5 mg by mouth at bedtime.   Yes [provider]  amiodarone (PACERONE) 200 MG tablet TAKE 1 TABLET(200 MG) BY MOUTH DAILY 02/13/23  Yes Camnitz, Andree Coss, MD  aspirin EC 81 MG tablet Take 81 mg by mouth daily.   Yes [provider]  ezetimibe (ZETIA) 10 MG tablet Take 1 tablet (10 mg total) by mouth daily. 05/01/15  Yes [name], MD  folic acid (FOLVITE) 1 MG tablet Take 1 mg by mouth daily.   Yes [provider]  furosemide (LASIX) 80 MG tablet TAKE 1 TABLET(80 MG) BY MOUTH DAILY 08/15/23  Yes Lennette Bihari, MD  losartan (COZAAR) 25 MG tablet TAKE 1 TABLET(25 MG) BY MOUTH DAILY 05/01/23  Yes [name], MD  metFORMIN (GLUCOPHAGE) 1000 MG tablet Take 500 mg by mouth 2 (two) times daily with a meal.   Yes [provider]  Misc Natural Products (IMMUNE FORMULA PO) Take 1 each by mouth daily. Immune: Vitamin C, D and Zinc. Chew 1 tablets daily.   Yes [provider]  omeprazole (PRILOSEC) 40 MG capsule TAKE ONE CAPSULE BY MOUTH DAILY AS NEEDED FOR HEARTBURN 05/17/22  Yes [name], MD   pioglitazone (ACTOS) 45 MG tablet Take 45 mg by mouth daily.    Yes [provider]  rosuvastatin (CRESTOR) 20 MG tablet TAKE 1 TABLET(20 MG) BY MOUTH DAILY 05/01/23  Yes Camnitz, Andree Coss, MD  Vitamin D, Ergocalciferol, (DRISDOL) 1.25 MG (50000 UNIT) CAPS capsule Take 1 capsule (50,000 Units total) by mouth every 7 (seven) days. 09/05/23  Yes Corinna Capra A, DO  warfarin (COUMADIN) 1 MG tablet Take 1 mg by mouth See admin instructions. Alternate 1mg  with 2mg  dose on a schedule of once daily 2mg , 2mg , 1mg  and repeat pattern.   Yes [provider]  warfarin (COUMADIN) 2 MG tablet Take 2 mg by mouth See admin instructions. Alternate 1mg  with 2mg  dose on a schedule of once daily 2mg , 2mg , 1mg  and repeat pattern.   Yes [provider]  albuterol (VENTOLIN HFA) 108 (90 Base) MCG/ACT inhaler Inhale 2 puffs into the lungs every 6 (six) hours as needed for shortness of breath.  Patient not taking: Reported on 10/12/2023 12/25/19   [provider]  ammonium lactate (LAC-HYDRIN) 12 % lotion Apply 1 application  topically as needed for dry skin. Patient not taking: Reported on 10/12/2023 12/27/21   [provider]  carvedilol (COREG) 6.25 MG tablet Take 6.25 mg by mouth 2 (two) times daily with a meal. Patient not taking: Reported on 10/12/2023    [provider]  Elastic Bandages & Supports (MEDICAL COMPRESSION STOCKINGS) MISC Knee High 20-30 mm compression stockings 03/02/18   [name], MD  fluticasone (FLONASE) 50 MCG/ACT nasal spray Place 1 spray into both nostrils daily as needed for allergies or rhinitis. Patient not taking: Reported on 10/12/2023    [provider]  Insulin Pen Needle (BD PEN NEEDLE NANO 2ND GEN) 32G X 4 MM MISC BS BID 06/21/23   Corinna Capra A, DO  Semaglutide,0.25 or 0.5MG /DOS, (OZEMPIC, 0.25 OR 0.5 MG/DOSE,) 2 MG/3ML SOPN INJECT 0.25MG  INTO THE SKIN ONCE WEEKLY Patient not taking: Reported on 10/12/2023 08/03/23    Irene Limbo, FNP    Inpatient Medications: Scheduled Meds:  ALPRAZolam  0.25-0.5 mg Oral QHS   amiodarone  100 mg Oral Daily   aspirin EC  81 mg Oral Daily   cyanocobalamin  1,000 mcg Intramuscular Daily   ezetimibe  10 mg Oral Daily   folic acid  1 mg Oral Daily   furosemide  40 mg Intravenous Q12H   insulin aspart  0-9 Units Subcutaneous TID WC   pantoprazole  80 mg Oral Daily   polyethylene glycol  17 g Oral Daily   potassium chloride  40 mEq Oral Q4H   rosuvastatin  20 mg Oral Daily   Continuous Infusions:  PRN Meds: acetaminophen **OR** acetaminophen, bisacodyl  Allergies:    Allergies  Allergen Reactions   Morphine And Codeine     hallucinations      Social History:   Social History   Socioeconomic History   Marital status: Married    Spouse name: Not on file   Number of children: Not on file   Years of education: Not on file   Highest education level: Not on file  Occupational History   Not on file  Tobacco Use   Smoking status: Never   Smokeless tobacco: Never  Substance and Sexual Activity   Alcohol use: No    Comment: none since 1979   Drug use: No   Sexual activity: Not on file  Other Topics Concern   Not on file  Social History Narrative   Not on file   Social Determinants of Health   Financial Resource Strain: Low Risk  (04/07/2022)   Received from [facility], [facility]   Overall Financial Resource Strain (CARDIA)    Difficulty of Paying Living Expenses: Not very hard  Food Insecurity: No Food Insecurity (10/12/2023)   Hunger Vital Sign    Worried About Running Out of Food in the Last Year: Never true    Ran Out of Food in the Last Year: Never true  Transportation Needs: No Transportation Needs (10/12/2023)   PRAPARE - Administrator, Civil Service (Medical): No    Lack of Transportation (Non-Medical): No  Physical Activity: Unknown (04/11/2023)   Received from [facility], [facility]   Exercise Vital  Sign    Days of Exercise per Week: Patient declined    Minutes of Exercise per Session: Not on file  Stress: Patient Declined (04/11/2023)   [facility], [facility]   [facility] - Occupational  Stress Questionnaire    Feeling of Stress : Patient declined  Social Connections: Unknown (03/31/2022)   Received from [facility], [facility]   Social Network    Social Network: Not on file  Intimate Partner Violence: Not At Risk (10/12/2023)   Humiliation, Afraid, Rape, and Kick questionnaire    Fear of Current or Ex-Partner: No    Emotionally Abused: No    Physically Abused: No    Sexually Abused: No    Family History:   Family History  Problem Relation Age of Onset   Diabetes Mother    Hypertension Brother    Diabetes Brother    Heart attack Neg Hx    Stroke Neg Hx      ROS:  Review of Systems  Constitutional: Positive for malaise/fatigue.  Cardiovascular:  Positive for chest pain, dyspnea on exertion, leg swelling and near-syncope. Negative for claudication, irregular heartbeat, orthopnea, palpitations, paroxysmal nocturnal dyspnea and syncope.  Hematologic/Lymphatic: Negative for bleeding problem.  Gastrointestinal:        Black stools   Neurological:  Positive for dizziness and light-headedness.   Physical Exam/Data:   Vitals:   10/12/23 1930 10/12/23 2331 10/13/23 0401 10/13/23 0722  BP: 106/76 107/61 121/64 131/70  Pulse: (!) 50 (!) 44 (!) 42 (!) 43  Resp: 19 18 20 20   Temp: (!) 97.5 F (36.4 C) 97.6 F (36.4 C) 98.7 F (37.1 C) 98.5 F (36.9 C)  TempSrc: Oral Oral Oral Oral  SpO2: 95% 96% 92% 98%  Weight:      Height:        Intake/Output Summary (Last 24 hours) at 10/13/2023 1113 Last data filed at 10/13/2023 0445 Gross per 24 hour  Intake 597 ml  Output 2300 ml  Net -1703 ml      10/12/2023    4:43 PM 10/12/2023   11:01 AM 10/11/2023    1:41 PM  Last 3 Weights  Weight (lbs) 237 lb 10.5 oz 242 lb  242 lb  Weight (kg) 107.8 kg 109.77 kg 109.77 kg     Body mass index is 38.36 kg/m.  Physical Exam  Constitutional: No distress. He appears chronically ill.  hemodynamically stable  Neck: JVD present.  Cardiovascular: Regular rhythm, S1 normal and S2 normal. Bradycardia present. Exam reveals no gallop, no S3 and no S4.  No murmur heard. Pulses:      Dorsalis pedis pulses are 2+ on the right side and 2+ on the left side.  Pulmonary/Chest: Effort normal. No stridor. He has no wheezes. He has bibasilar rales.  Abdominal: Soft. Bowel sounds are normal. He exhibits no distension. There is no abdominal tenderness.  Musculoskeletal:        General: Edema (2+ bilateral lower extremity swelling extending to bilateral lateral thighs) present.     Cervical back: Neck supple.  Neurological: He is alert and oriented to person, place, and time. He has intact cranial nerves (2-12).  Skin: Skin is warm.   EKG:  10/12/2023: Sinus bradycardia, 56 bpm, low voltage, without underlying injury pattern. Telemetry: Sinus bradycardia, independently reviewed  Relevant CV Studies: Cardiac PET/CT: October 2024   Small, mild, fixed defect in the mid lateral wall consistent with prior infarction. No ischemia. MBFR not reported due to prior CABG. Low-risk study.   LV perfusion is abnormal. There is no evidence of ischemia. There is evidence of infarction. Defect 1: There is a small defect with mild reduction in uptake present in the mid lateral location(s) that is fixed. There is  normal wall motion in the defect area. Consistent with infarction. The defect is consistent with abnormal perfusion in the LCx territory.   Rest left ventricular function is normal. Rest EF: 45%. Stress left ventricular function is normal. Stress EF: 55%. End diastolic cavity size is mildly enlarged.   Myocardial blood flow reserve is not reported in this patient due to technical or patient-specific concerns that affect accuracy.   Coronary  calcium assessment not performed due to prior revascularization.   Findings are consistent with infarction. The study is low risk.  Heart catheterization: May 2021: IMPRESSION: Mr. Bonton has widely patent grafts with three-vessel native disease and normal filling pressures.  His echo revealed normal LV function.  I believe his dyspnea is multifactorial probably related to obesity and deconditioning.  His grafts are amazingly free of disease 17 years status post bypass grafting in 14 years since his last cath.   Laboratory Data:  High Sensitivity Troponin:   Recent Labs  Lab 10/12/23 1106 10/12/23 1341  TROPONINIHS 7 6     Chemistry Recent Labs  Lab 10/11/23 1456 10/11/23 1457 10/12/23 1106 10/13/23 0544  NA 139  --  137 134*  K 4.5  --  3.5 3.4*  CL 100  --  101 99  CO2 26  --  27 26  GLUCOSE 67*  --  108* 90  BUN 20  --  24* 21  CREATININE 1.52*  --  1.56* 1.37*  CALCIUM 9.0  --  8.5* 8.5*  MG  --  2.0  --   --   GFRNONAA  --   --  47* 55*  ANIONGAP  --   --  9 9    Recent Labs  Lab 10/11/23 1456  PROT 6.0  ALBUMIN 4.0  AST 37  ALT 27  ALKPHOS 94  BILITOT 0.7   Lipids No results for input(s): "CHOL", "TRIG", "HDL", "LABVLDL", "LDLCALC", "CHOLHDL" in the last 168 hours.  Hematology Recent Labs  Lab 10/11/23 1456 10/12/23 1106 10/12/23 1745 10/13/23 0544  WBC 4.5 5.5  --  4.3  RBC 3.17* 3.05* 3.04* 3.02*  HGB 8.9* 8.5*  --  8.3*  HCT 29.2* 27.2*  --  26.4*  MCV 92 89.2  --  87.4  MCH 28.1 27.9  --  27.5  MCHC 30.5* 31.3  --  31.4  RDW 15.0 17.4*  --  17.5*  PLT 184 182  --  166   Thyroid  Recent Labs  Lab 10/12/23 1745  TSH 1.807  FREET4 1.39*    BNP Recent Labs  Lab 10/12/23 1149  BNP 385.4*    DDimer No results for input(s): "DDIMER" in the last 168 hours.   Radiology/Studies:  DG Chest 2 View  Result Date: 10/12/2023 CLINICAL DATA:  Chest pain. EXAM: CHEST - 2 VIEW COMPARISON:  01/07/2022 FINDINGS: Poor inspiration. Mildly enlarged  cardiac silhouette without gross change. Mild pulmonary vascular congestion, accentuated by the poor inspiration. Small left pleural effusion. Small amount of linear density at the left lung base. Post CABG changes. No acute bony abnormality. IMPRESSION: 1. Poor inspiration. 2. Mild cardiomegaly and mild pulmonary vascular congestion. 3. Small left pleural effusion. 4. Small amount of left basilar atelectasis. Electronically Signed   By: Beckie Salts M.D.   On: 10/12/2023 15:43     Assessment and Plan:   Bradycardia: Likely symptomatic. Agree with holding carvedilol for now. Will continue amiodarone given his history of persistent atrial fibrillation with close monitoring on telemetry. Ventricular rate  is improving after stopping carvedilol 10/11/2023. TSH within normal limits  Anginal chest pain Coronary disease status post four-vessel CABG in 2003 Dyspnea on exertion Patient been having symptoms for the last 6 months but more progressive over the last 2 or 3 weeks. Overall functional capacity/physical endurance has reduced. EKG is nonischemic Echo will be ordered to evaluate for structural heart disease and left ventricular systolic function. Despite the recent cardiac PET/CT reported to be low risk he continues to have symptoms.  Balanced ischemia cannot be entirely ruled out vs. Demand ischemia and therefore once the anemic workup is complete would recommend left heart catheterization prior to discharge. High sensitive troponins negative x 2  Acute exacerbation of HFpEF Last echo 2021-LVEF preserved, RV function preserved, no significant valvular heart disease. He has bilateral lower extremity swelling extending to the lateral thighs. His chronic dyspnea on exertion has progressed. BMP slightly elevated Continue IV Lasix Strict I's and O's and daily weights Will uptitrate GDMT as hemodynamics and laboratory values allow.  Normocytic normochromic anemia: Endorses black-colored  stools Fecal occult negative Hemoglobin December 2023 12.2 g/dL. Hemoglobin currently is 8.5 g/dL. Checked care everywhere unable to find a recent hemoglobin level.  Requested records from PCP Defer anemia workup to primary. Once we know there is no source of bleeding recommend left heart catheterization to reevaluate for obstructive disease  Persistent atrial fibrillation: Currently normal sinus rhythm Rate control carvedilol, held secondary to bradycardia Rhythm control: Amiodarone Thromboembolic prophylaxis: On Coumadin given his history of pulmonary embolism and lupus anticoagulant.  Currently being held due to concerns for anemia. Click Here to Calculate/Change CHADS2VASc Score The patient's CHADS2-VASc score is 4, indicating a 4.8% annual risk of stroke.   CHF History: Yes HTN History: Yes Diabetes History: Yes Stroke History: No Vascular Disease History: No  Hypertension: Continue home medications.Monitor for now.  Obstructive sleep apnea: Endorses compliance with CPAP.  Diabetes mellitus type 2: Management per primary team.  Reemphasized importance of glycemic control.  Risk Assessment/Risk Scores:     New York Heart Association (NYHA) Functional Class NYHA Class III  CHA2DS2-VASc Score = 4   This indicates a 4.8% annual risk of stroke. The patient's score is based upon: CHF History: 1 HTN History: 1 Diabetes History: 1 Stroke History: 0 Vascular Disease History: 0 Age Score: 1 Gender Score: 0   For questions or updates, please contact [facility] Please consult [email or web address] for contact info under    Signed, Tessa Lerner, DO, [facility]  [facility]  [address] [place], [place] [address] Pager: [phone] Office: [phone]

## 2023-10-12 NOTE — Plan of Care (Signed)

## 2023-10-12 NOTE — ED Provider Notes (Addendum)
[facility] Provider Note   CSN: [id number] Arrival date & time: 10/12/23  1046     History  Chief Complaint  Patient presents with   Chest Pain   Abnormal Lab    Eduardo Butler is a 72 y.o. male.  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  Patient went to the cardiologist yesterday due to sensations of feeling lightheaded and dizzy for some time now but seems to be worsening.  He reports that definitely over the last month or so it is gotten significantly worse and anytime he is up in the last week even to walk short distances he starts having some chest tightness and feeling lightheaded and dizzy which improves when he sits down and rests.  He has not had any syncopal events.  He denies any recent cough, fever.  He has been having issues with back pain but denies any abdominal pain nausea or vomiting.  He has not had any diarrhea but did note that his stool looked very dark this morning.  He cannot recall the last time he had a colonoscopy but reports it has been years ago but they have always been normal in the past.  The history is provided by the patient and medical records.  Chest Pain Abnormal Lab      Home Medications Prior to Admission medications   Medication Sig Start Date End Date Taking? Authorizing Provider  acetaminophen (TYLENOL) 500 MG tablet Take 1,000-1,500 mg by mouth every 6 (six) hours as needed for moderate pain.    [provider]  albuterol (VENTOLIN HFA) 108 (90 Base) MCG/ACT inhaler Inhale 2 puffs into the lungs every 6 (six) hours as needed for shortness of breath.  12/25/19   [provider]  ALPRAZolam Prudy Feeler) 0.5 MG tablet Take 0.25 mg by mouth at bedtime.    [provider]  amiodarone (PACERONE) 200 MG tablet  TAKE 1 TABLET(200 MG) BY MOUTH DAILY 02/13/23   Camnitz, Andree Coss, MD  ammonium lactate (LAC-HYDRIN) 12 % lotion Apply 1 application  topically daily. 12/27/21   [provider]  aspirin EC 81 MG tablet Take 81 mg by mouth daily.    [provider]  cetirizine (ZYRTEC) 10 MG tablet Take 1 tablet (10 mg total) by mouth once as needed for up to 1 dose for allergies or rhinitis. Patient taking differently: Take 10 mg by mouth daily as needed for allergies or rhinitis. 09/13/20   Derwood Kaplan, MD  Elastic Bandages & Supports (MEDICAL COMPRESSION STOCKINGS) MISC Knee High 20-30 mm compression stockings 03/02/18   [name], MD  ezetimibe (ZETIA) 10 MG tablet Take 1 tablet (10 mg total) by mouth daily. 05/01/15   [name], MD  fluticasone (FLONASE) 50 MCG/ACT nasal spray Place 1 spray into both nostrils daily as needed for allergies or rhinitis.    [provider]  folic acid (FOLVITE) 1 MG tablet Take 1 mg by mouth daily.    [provider]  furosemide (LASIX) 80 MG tablet TAKE 1 TABLET(80 MG) BY MOUTH DAILY 08/15/23   Lennette Bihari, MD  HYDROcodone-acetaminophen (NORCO/VICODIN) 5-325 MG tablet Take 1 tablet by mouth every 6 (six) hours as needed (pain). 06/23/20   [provider]  Insulin Pen Needle (BD PEN NEEDLE NANO 2ND GEN) 32G  X 4 MM MISC BS BID 06/21/23   Corinna Capra A, DO  losartan (COZAAR) 25 MG tablet TAKE 1 TABLET(25 MG) BY MOUTH DAILY 05/01/23   [name], MD  metFORMIN (GLUCOPHAGE) 1000 MG tablet Take 500 mg by mouth 2 (two) times daily with a meal.    [provider]  Misc Natural Products (IMMUNE FORMULA PO) Take 1 each by mouth daily. Immune: Vitamin C, D and Zinc. Chew 1 tablets daily.    [provider]  omeprazole (PRILOSEC) 40 MG capsule TAKE ONE CAPSULE BY MOUTH DAILY AS NEEDED FOR HEARTBURN 05/17/22   [name], MD  ondansetron (ZOFRAN) 4 MG tablet Take 1 tablet (4 mg total) by mouth every 8  (eight) hours as needed for nausea or vomiting. 05/11/23   Corinna Capra A, DO  pioglitazone (ACTOS) 45 MG tablet Take 45 mg by mouth daily.     [provider]  rosuvastatin (CRESTOR) 20 MG tablet TAKE 1 TABLET(20 MG) BY MOUTH DAILY 05/01/23   Camnitz, Andree Coss, MD  Semaglutide,0.25 or 0.5MG /DOS, (OZEMPIC, 0.25 OR 0.5 MG/DOSE,) 2 MG/3ML SOPN INJECT 0.25MG  INTO THE SKIN ONCE WEEKLY 08/03/23   Irene Limbo, FNP  Vitamin D, Ergocalciferol, (DRISDOL) 1.25 MG (50000 UNIT) CAPS capsule Take 1 capsule (50,000 Units total) by mouth every 7 (seven) days. 09/05/23   Corinna Capra A, DO  warfarin (COUMADIN) 3 MG tablet Take 3 mg by mouth daily.    [provider]  warfarin (COUMADIN) 4 MG tablet Take 4 mg by mouth See admin instructions. Take Sunday and Tuesday and Thursday Patient not taking: Reported on 10/11/2023 07/17/19   [provider]  warfarin (COUMADIN) 5 MG tablet Take 5 mg by mouth See admin instructions. Take on Monday,  Wednesday, Friday and Saturday Patient not taking: Reported on 10/11/2023    [provider]      Allergies    Morphine and codeine    Review of Systems   Review of Systems  Cardiovascular:  Positive for chest pain.    Physical Exam Updated Vital Signs BP 125/67   Pulse (!) 42   Temp 97.7 F (36.5 C) (Oral)   Resp 18   Ht 5\' 6"  (1.676 m)   Wt 109.8 kg   SpO2 95%   BMI 39.06 kg/m  Physical Exam Vitals and nursing note reviewed.  Constitutional:      General: He is not in acute distress.    Appearance: He is well-developed.  HENT:     Head: Normocephalic and atraumatic.  Eyes:     Conjunctiva/sclera: Conjunctivae normal.     Pupils: Pupils are equal, round, and reactive to light.  Cardiovascular:     Rate and Rhythm: Normal rate and regular rhythm.     Heart sounds: No murmur heard. Pulmonary:     Effort: Pulmonary effort is normal. No respiratory distress.     Breath sounds: Normal breath sounds. No wheezing or  rales.  Abdominal:     General: There is no distension.     Palpations: Abdomen is soft.     Tenderness: There is no abdominal tenderness. There is no guarding or rebound.  Genitourinary:    Rectum: Normal. Guaiac result negative.  Musculoskeletal:        General: No tenderness. Normal range of motion.     Cervical back: Normal range of motion and neck supple.     Right lower leg: Edema present.     Left lower leg: Edema present.  Skin:    General: Skin is warm and dry.     Findings: No erythema or rash.  Neurological:     Mental Status: He is alert and oriented to person, place, and time. Mental status is at baseline.  Psychiatric:        Behavior: Behavior normal.     ED Results / Procedures / Treatments   Labs (all labs ordered are listed, but only abnormal results are displayed) Labs Reviewed  BASIC METABOLIC PANEL - Abnormal; Notable for the following components:      Result Value   Glucose, Bld 108 (*)    BUN 24 (*)    Creatinine, Ser 1.56 (*)    Calcium 8.5 (*)    GFR, Estimated 47 (*)    All other components within normal limits  CBC - Abnormal; Notable for the following components:   RBC 3.05 (*)    Hemoglobin 8.5 (*)    HCT 27.2 (*)    RDW 17.4 (*)    All other components within normal limits  PROTIME-INR - Abnormal; Notable for the following components:   Prothrombin Time 26.3 (*)    INR 2.4 (*)    All other components within normal limits  BRAIN NATRIURETIC PEPTIDE - Abnormal; Notable for the following components:   B Natriuretic Peptide 385.4 (*)    All other components within normal limits  POC OCCULT BLOOD, ED  TYPE AND SCREEN  TROPONIN I (HIGH SENSITIVITY)  TROPONIN I (HIGH SENSITIVITY)    EKG EKG Interpretation Date/Time:  Thursday October 12 2023 10:53:17 EST Ventricular Rate:  56 PR Interval:  178 QRS Duration:  92 QT Interval:  478 QTC Calculation: 461 R Axis:   97  Text Interpretation: Sinus bradycardia Rightward axis Low voltage  QRS bradycardia somewhat improved When compared with ECG of 11-Oct-2023 13:45, PREVIOUS ECG IS PRESENT Confirmed by Gwyneth Sprout ([id number]) on 10/12/2023 1:36:48 PM  Radiology No results found.  Procedures Procedures    Medications Ordered in ED Medications - No data to display  ED Course/ Medical Decision Making/ A&P                                 Medical Decision Making Amount and/or Complexity of Data Reviewed External Data Reviewed: notes. Labs: ordered. Decision-making details documented in ED Course. Radiology: ordered and independent interpretation performed. Decision-making details documented in ED Course. ECG/medicine tests: ordered and independent interpretation performed. Decision-making details documented in ED Course.  Risk Decision regarding hospitalization.   Pt with multiple medical problems and comorbidities and presenting today with a complaint that caries a high risk for morbidity and mortality.  Here today with complaints of lightheadedness, dizziness, chest pain with exertion and shortness of breath with exertion.  This has been waxing and waning but worsening over the last several months.  Followed up with cardiology yesterday found to be bradycardic in the office and beta-blocker was discontinued but lab work yesterday showed new anemia with hemoglobin from 12-8.9 as well as new AKI.  Patient was contacted instructed to come to the emergency room.  He reports there is really been no significant change since yesterday except heart rate has improved a little bit.  He continues to have significant swelling in his legs and took his Lasix this morning.  While here he has already urinated approximately 1 L.  He felt like he had a dark stool this morning however Hemoccult was negative  for blood and stool was brown.  Patient does take Coumadin and has been compliant with that medication.  I independently interpreted patient's EKG and labs.  EKG is unchanged today except  for improved heart rate in the 50s however he is on continuous cardiac monitoring and by my independent interpretation is still bradycardic and at times will be in the 40s and at the most his heart rate increases to the 50s.  INR today is therapeutic at 2.4, BNP is elevated at 385, BMP with creatinine of 1.58 which is above his baseline of 0.810 months ago and hemoglobin is 8.5 from 8.9 yesterday.  Troponin is normal at 7.  He also has evidence for fluid overload on exam and concern for multifactorial process causing his symptoms including concern for CHF, anemia, possible CAD.  No symptoms to suggest ACS today and EKG shows no evidence concerning for STEMI.  Low suspicion for infectious etiology at this time.  Patient's TSH yesterday was normal.    I have independently visualized and interpreted pt's images today.  Chest x-ray not significant findings of fluid overload today.  Feel that patient needs admission for symptomatic anemia as well as new AKI and bradycardia.  Discussed this with the patient and his wife.  Will hold transfusion at this time and continue to watch.  Consulted cards to see.  Consulted hospitalist for admission.         Final Clinical Impression(s) / ED Diagnoses Final diagnoses:  Symptomatic anemia  Bradycardia  AKI (acute kidney injury) [facility])    Rx / DC Orders ED Discharge Orders     None         Gwyneth Sprout, MD 10/12/23 1352    Gwyneth Sprout, MD 10/12/23 1353

## 2023-10-12 NOTE — Telephone Encounter (Signed)
Called patient  x 2   RN able to talk to patient / informed patient that Dr Bjorn Pippin reviewed his lab work ,due to the abnormal finding  requested patient to follow up at [facility] ER today for more evaluation. Patient is agreeable and state he will go  today.  Patient states he has taken his furosemide already today. Patient states his heart rate is up but did not give a number.

## 2023-10-12 NOTE — Progress Notes (Signed)
PHARMACY - ANTICOAGULATION CONSULT NOTE  Pharmacy Consult for warfarin Indication:  history of PE, lupus anticoagulant positive  Allergies  Allergen Reactions   Morphine And Codeine     hallucinations      Patient Measurements: Height: 5\' 6"  (167.6 cm) Weight: 109.8 kg (242 lb) IBW/kg (Calculated) : 63.8  Vital Signs: Temp: 97.8 F (36.6 C) (11/14 1342) Temp Source: Oral (11/14 1342) BP: 118/62 (11/14 1415) Pulse Rate: 43 (11/14 1415)  Labs: Recent Labs    10/11/23 1456 10/12/23 1106 10/12/23 1149 10/12/23 1341  HGB 8.9* 8.5*  --   --   HCT 29.2* 27.2*  --   --   PLT 184 182  --   --   LABPROT  --   --  26.3*  --   INR  --   --  2.4*  --   CREATININE 1.52* 1.56*  --   --   TROPONINIHS  --  7  --  6    Estimated Creatinine Clearance: 49.8 mL/min (A) (by C-G formula based on SCr of 1.56 mg/dL (H)).   Medical History: Past Medical History:  Diagnosis Date   Anxiety    Arthritis    CHF (congestive heart failure) (HCC)    Coronary artery disease    Diabetes (HCC)    H/O cardiac catheterization 08/25/2006   normal cath-patent grafts   H/O Doppler ultrasound 02/21/2011   lower ext.venous doppler,, no treatment except support stockings are recommended if clinically indicated   H/O echocardiogram 12/02/2010   EF 55% , no significant abnormalities   History of stress test 01/04/2013   Lexiscan Myoview ; scattered PVC's ; LV EF 54% ; Normal stress test    Hyperlipidemia    Hypertension    Obesity    Peroneal DVT (deep venous thrombosis) (HCC)    Shortness of breath     Assessment: 72yoM on warfarin for history of PE, peroneal DVT, pAF, anticoagulant positive lupus. Takes warfarin on a 2 mg, 2 mg, 1 mg patterned schedule rather than weekly schedule. No ongoing bleeding noted by provider evaluation however patient is anemic at this time with Hgb in 8's. INR today therapeutic at 2.4 so will continue home regimen.  11/12 2 mg 11/13 1 mg 11/14 2 mg  Goal of  Therapy:  INR 2-3 Monitor platelets by anticoagulation protocol: Yes   Plan:  Warfarin 2 mg x1 dose ordered 11/14 Pt will be due for 2 mg warfarin 11/15 Continue to monitor H&H and platelets Evaluate all new orders for interactions with warfarin  Rutherford Nail, PharmD PGY2 Critical Care Pharmacy Resident 10/12/2023,4:10 PM

## 2023-10-13 ENCOUNTER — Inpatient Hospital Stay (HOSPITAL_COMMUNITY): Payer: PPO

## 2023-10-13 DIAGNOSIS — R9431 Abnormal electrocardiogram [ECG] [EKG]: Secondary | ICD-10-CM

## 2023-10-13 DIAGNOSIS — I209 Angina pectoris, unspecified: Secondary | ICD-10-CM | POA: Diagnosis not present

## 2023-10-13 DIAGNOSIS — E119 Type 2 diabetes mellitus without complications: Secondary | ICD-10-CM

## 2023-10-13 DIAGNOSIS — N179 Acute kidney failure, unspecified: Secondary | ICD-10-CM | POA: Diagnosis not present

## 2023-10-13 DIAGNOSIS — I5031 Acute diastolic (congestive) heart failure: Secondary | ICD-10-CM | POA: Diagnosis not present

## 2023-10-13 DIAGNOSIS — R001 Bradycardia, unspecified: Secondary | ICD-10-CM | POA: Diagnosis not present

## 2023-10-13 DIAGNOSIS — Z951 Presence of aortocoronary bypass graft: Secondary | ICD-10-CM

## 2023-10-13 DIAGNOSIS — D649 Anemia, unspecified: Secondary | ICD-10-CM | POA: Diagnosis not present

## 2023-10-13 HISTORY — DX: Presence of aortocoronary bypass graft: Z95.1

## 2023-10-13 LAB — ECHOCARDIOGRAM COMPLETE
Area-P 1/2: 5.2 cm2
Calc EF: 66.1 %
Height: 66 in
MV M vel: 3.31 m/s
MV Peak grad: 43.7 mm[Hg]
MV VTI: 1.92 cm2
S' Lateral: 3.7 cm
Single Plane A2C EF: 67.2 %
Single Plane A4C EF: 64.6 %
Weight: 3802.49 [oz_av]

## 2023-10-13 LAB — BASIC METABOLIC PANEL
Anion gap: 9 (ref 5–15)
BUN: 21 mg/dL (ref 8–23)
CO2: 26 mmol/L (ref 22–32)
Calcium: 8.5 mg/dL — ABNORMAL LOW (ref 8.9–10.3)
Chloride: 99 mmol/L (ref 98–111)
Creatinine, Ser: 1.37 mg/dL — ABNORMAL HIGH (ref 0.61–1.24)
GFR, Estimated: 55 mL/min — ABNORMAL LOW (ref >60)
Glucose, Bld: 90 mg/dL (ref 70–99)
Potassium: 3.4 mmol/L — ABNORMAL LOW (ref 3.5–5.1)
Sodium: 134 mmol/L — ABNORMAL LOW (ref 135–145)

## 2023-10-13 LAB — CBC
HCT: 26.4 % — ABNORMAL LOW (ref 39.0–52.0)
Hemoglobin: 8.3 g/dL — ABNORMAL LOW (ref 13.0–17.0)
MCH: 27.5 pg (ref 26.0–34.0)
MCHC: 31.4 g/dL (ref 30.0–36.0)
MCV: 87.4 fL (ref 80.0–100.0)
Platelets: 166 10*3/uL (ref 150–400)
RBC: 3.02 MIL/uL — ABNORMAL LOW (ref 4.22–5.81)
RDW: 17.5 % — ABNORMAL HIGH (ref 11.5–15.5)
WBC: 4.3 10*3/uL (ref 4.0–10.5)
nRBC: 0 % (ref 0.0–0.2)

## 2023-10-13 LAB — PROTIME-INR
INR: 2.2 — ABNORMAL HIGH (ref 0.8–1.2)
Prothrombin Time: 24.3 s — ABNORMAL HIGH (ref 11.4–15.2)

## 2023-10-13 LAB — GLUCOSE, CAPILLARY
Glucose-Capillary: 76 mg/dL (ref 70–99)
Glucose-Capillary: 109 mg/dL — ABNORMAL HIGH (ref 70–99)
Glucose-Capillary: 146 mg/dL — ABNORMAL HIGH (ref 70–99)

## 2023-10-13 LAB — ABO/RH: ABO/RH(D): A POS

## 2023-10-13 MED ORDER — AMIODARONE HCL 200 MG PO TABS
100.0000 mg | ORAL_TABLET | Freq: Every day | ORAL | Status: DC
  Administered 2023-10-15 – 2023-10-21 (×7): 100 mg via ORAL
  Filled 2023-10-13 (×9): qty 1

## 2023-10-13 MED ORDER — PANTOPRAZOLE SODIUM 40 MG PO TBEC
80.0000 mg | DELAYED_RELEASE_TABLET | Freq: Every day | ORAL | Status: DC
  Administered 2023-10-13 – 2023-10-21 (×9): 80 mg via ORAL
  Filled 2023-10-13 (×9): qty 2

## 2023-10-13 MED ORDER — CYANOCOBALAMIN 1000 MCG/ML IJ SOLN
1000.0000 ug | Freq: Every day | INTRAMUSCULAR | Status: DC
  Administered 2023-10-13 – 2023-10-17 (×5): 1000 ug via INTRAMUSCULAR
  Filled 2023-10-13 (×7): qty 1

## 2023-10-13 MED ORDER — POTASSIUM CHLORIDE CRYS ER 20 MEQ PO TBCR
40.0000 meq | EXTENDED_RELEASE_TABLET | ORAL | Status: AC
Start: 2023-10-13 — End: 2023-10-13
  Administered 2023-10-13 (×2): 40 meq via ORAL
  Filled 2023-10-13 (×2): qty 2

## 2023-10-13 MED ORDER — FUROSEMIDE 10 MG/ML IJ SOLN
40.0000 mg | Freq: Two times a day (BID) | INTRAMUSCULAR | Status: DC
  Administered 2023-10-13 – 2023-10-14 (×3): 40 mg via INTRAVENOUS
  Filled 2023-10-13 (×3): qty 4

## 2023-10-13 NOTE — Hospital Course (Signed)
72 y.o. male with a PMH significant for HLD, CAD, anxiety, PE on Coumadin, HTN, lupus anticoagulant positive, PAF, OSA, type 2 diabetes mellitus, obesity. Pt presented from home to the ED on 10/12/2023 due to the guidance of their cardiologist in setting of anemia and AKI on outpatient labs. Pt also had chest pains PTA. Pt had been taking ozempic since 2/24 with GI symptoms starting about 2-3 months later. Pt reports dark brown stools, not black, red, or maroon.  In the ED, pt was found to have HR in the 40's. Stool was heme neg. Hgb of 8.3. Cardiology was consulted.

## 2023-10-13 NOTE — Progress Notes (Signed)
  Progress Note   Patient: Eduardo Butler:[id number] DOB: [birth year] DOA: 10/12/2023     1 DOS: the patient was seen and examined on 10/13/2023   Brief hospital course: 72 y.o. male with a PMH significant for HLD, CAD, anxiety, PE on Coumadin, HTN, lupus anticoagulant positive, PAF, OSA, type 2 diabetes mellitus, obesity. Pt presented from home to the ED on 10/12/2023 due to the guidance of their cardiologist in setting of anemia and AKI on outpatient labs. Pt also had chest pains PTA. Pt had been taking ozempic since 2/24 with GI symptoms starting about 2-3 months later. Pt reports dark brown stools, not black, red, or maroon.  In the ED, pt was found to have HR in the 40's. Stool was heme neg. Hgb of 8.3. Cardiology was consulted.   Assessment and Plan: Bradycardia -heart rate in the 40s at presentation -Cardiology following -TSH 1.807 -cont to hold Beta blocker and amio. Per Cardiology, consider lower dose of amio prior to d/c -cont to monitor on tele   AKI - hypervolemic on exam with 3+ LE edema and pulmonary congestion with small left pleural effusion. recently started ozempic and drinking kool-aid with splenda to increase his fluid intake. Baseline Cr 10 months ago was normal at 0.9. 1.5 on presentation today and in office yesterday.  - Pt is continued on IV lasix -Cr improved this AM -Recheck bmet in AM   Anemia- -No obvious sign of blood loss. Stool is heme neg on admit. Pt reports dark brown stool, no melena or red stools -Iron level normal, folate normal -B12 is low at 169 -Have ordered daily b12 injections, would plan for 7 days, then once monthly -continue to monitor closely for blood loss -currently off coumadin   HTN-  -Currently well controlled - holding home losartan in setting of AKI   OSA- -CPAP nightly   Type II diabetes mellitus -Hemoglobin A1c 6.2 -Sensitive sliding scale insulin while in hospital -cont to hold home glycemic agents   CAD-history of  CABG 2003.   -Continue aspirin, Zetia, rosuvastatin -Discussed with Cardiology. Tentative plan for heart cath on 11/18 once stable from a hematologic standpoint   Anxiety- chronic and not in acute flare - continue home anxiolytics PRN at bedtime    Subjective: Feeling generally weak  Physical Exam: Vitals:   10/12/23 2331 10/13/23 0401 10/13/23 0722 10/13/23 1215  BP: 107/61 121/64 131/70 (!) 121/59  Pulse: (!) 44 (!) 42 (!) 43 (!) 42  Resp: 18 20 20 18   Temp: 97.6 F (36.4 C) 98.7 F (37.1 C) 98.5 F (36.9 C) 98 F (36.7 C)  TempSrc: Oral Oral Oral Oral  SpO2: 96% 92% 98% 99%  Weight:      Height:       General exam: Awake, laying in bed, in nad Respiratory system: Normal respiratory effort, no wheezing Cardiovascular system: regular rate, s1, s2 Gastrointestinal system: Soft, nondistended, positive BS Central nervous system: CN2-12 grossly intact, strength intact Extremities: Perfused, no clubbing Skin: Normal skin turgor, no notable skin lesions seen Psychiatry: Mood normal // no visual hallucinations   Data Reviewed:  Labs reviewed: Na 134, K 3.4, Cr 1.37, Vit B12 169, Fe 47, Folate 28.7   Family Communication: Pt in room, family at bedside  Disposition: Status is: Inpatient Remains inpatient appropriate because: severity of illness  Planned Discharge Destination: Home    Author: Rickey Barbara, MD 10/13/2023 4:13 PM  For on call review [email or web address].

## 2023-10-13 NOTE — Plan of Care (Signed)
  Problem: Education: Goal: Ability to describe self-care measures that may prevent or decrease complications (Diabetes Survival Skills Education) will improve 10/13/2023 1039 by Joanne Chars, RN Outcome: Progressing 10/13/2023 1039 by Joanne Chars, RN Outcome: Progressing Goal: Individualized Educational Video(s) 10/13/2023 1039 by Joanne Chars, RN Outcome: Progressing 10/13/2023 1039 by Joanne Chars, RN Outcome: Progressing   Problem: Coping: Goal: Ability to adjust to condition or change in health will improve 10/13/2023 1039 by Joanne Chars, RN Outcome: Progressing 10/13/2023 1039 by Joanne Chars, RN Outcome: Progressing   Problem: Fluid Volume: Goal: Ability to maintain a balanced intake and output will improve 10/13/2023 1039 by Joanne Chars, RN Outcome: Progressing 10/13/2023 1039 by Joanne Chars, RN Outcome: Progressing   Problem: Health Behavior/Discharge Planning: Goal: Ability to identify and utilize available resources and services will improve 10/13/2023 1039 by Joanne Chars, RN Outcome: Progressing 10/13/2023 1039 by Joanne Chars, RN Outcome: Progressing Goal: Ability to manage health-related needs will improve 10/13/2023 1039 by Joanne Chars, RN Outcome: Progressing 10/13/2023 1039 by Joanne Chars, RN Outcome: Progressing   Problem: Nutritional: Goal: Maintenance of adequate nutrition will improve 10/13/2023 1039 by Joanne Chars, RN Outcome: Progressing 10/13/2023 1039 by Joanne Chars, RN Outcome: Progressing Goal: Progress toward achieving an optimal weight will improve 10/13/2023 1039 by Joanne Chars, RN Outcome: Progressing 10/13/2023 1039 by Joanne Chars, RN Outcome: Progressing   Problem: Skin Integrity: Goal: Risk for impaired skin integrity will decrease 10/13/2023 1039 by Joanne Chars, RN Outcome: Progressing 10/13/2023 1039 by Joanne Chars, RN Outcome: Progressing

## 2023-10-13 NOTE — Progress Notes (Signed)
Pt. Refused cpap for tonight. 

## 2023-10-13 NOTE — Progress Notes (Signed)
Patient Name: Eduardo Butler Date of Encounter: 10/13/2023 [facility] Cardiologist: Nanetta Batty, MD   Interval Summary  .    Feels less short of breath. No near-syncope or syncopal events Wife present at bedside  Vital Signs .    Vitals:   10/12/23 1930 10/12/23 2331 10/13/23 0401 10/13/23 0722  BP: 106/76 107/61 121/64 131/70  Pulse: (!) 50 (!) 44 (!) 42 (!) 43  Resp: 19 18 20 20   Temp: (!) 97.5 F (36.4 C) 97.6 F (36.4 C) 98.7 F (37.1 C) 98.5 F (36.9 C)  TempSrc: Oral Oral Oral Oral  SpO2: 95% 96% 92% 98%  Weight:      Height:        Intake/Output Summary (Last 24 hours) at 10/13/2023 1004 Last data filed at 10/13/2023 0445 Gross per 24 hour  Intake 597 ml  Output 2300 ml  Net -1703 ml   Net IO Since Admission: -1,703 mL [10/13/23 1004]     10/12/2023    4:43 PM 10/12/2023   11:01 AM 10/11/2023    1:41 PM  Last 3 Weights  Weight (lbs) 237 lb 10.5 oz 242 lb 242 lb  Weight (kg) 107.8 kg 109.77 kg 109.77 kg      Telemetry/ECG    Sinus bradycardia without arrhythmia- Personally Reviewed  Physical Exam .   GEN: No acute distress, chronically ill, hemodynamically stable Neck: Positive JVP Cardiac: Regular, bradycardic,no murmurs, rubs, or gallops.  Respiratory: Clear to auscultation bilaterally in the upper lung fields with rales at the bases. GI: Soft, nontender, non-distended  MS: +2 bilateral pitting edema extending to the lateral thighs bilaterally  Assessment & Plan .     Bradycardia: Felt to be symptomatic as he was complaining of feeling lightheaded, dizzy. Carvedilol has been discontinued. Patient is on amiodarone 200 mg p.o. daily at home -currently held secondary to bradycardia.  Reconsider prior to discharge likely at a lower dose depending on his ventricular rate at that time. Telemetry notes underlying rhythm to be sinus bradycardia without pauses arrhythmias Tsh is w/n normal limits.  No identifiable reversible  causes. No syncope.  Acute exacerbation of HFpEF: Last echo from 2021 notes LVEF to be preserved, RV function preserved, no significant valvular heart disease. Repeat echocardiogram is pending.  Continues to have swelling extending up to the knees.   Chronic dyspnea last several weeks. Net IO Since Admission: -1,703 mL [10/13/23 1101] Will add Lasix 40 mg IV push twice daily. Strict I's and O's and daily weight. Will uptitrate GDMT closer to discharge as hemodynamics and laboratory values allow. Patient is on Actos at home-in the setting of HFpEF with acute exacerbation would avoid this class of medication  Anginal chest pain: Heart disease status post four-vessel bypass in 2003 Dyspnea on exertion Symptoms have been ongoing for the last 6 months, progressive over the last 2 or 3 weeks. Severely volume overloaded on physical examination. Will focus on diuresis for now. EKG on arrival is nonischemic. Echocardiogram pending. Cardiac PET/CT performed recently reported to be low risk however in the setting of progressive symptoms, exertional discomfort, balanced ischemia cannot be entirely ruled out versus demand ischemia given his hemoglobin levels. I would recommend completing the workup for anemia and once cleared to be on antiplatelets and anticoagulation schedule left and right heart catheterization.  In the interim we will focus on diuresis. Will hold aspirin until the anemia workup is complete. Continue statin therapy and Zetia  Normocytic normochromic anemia: Endorses black-colored stools. Fecal  occult negative during this admission. Last hemoglobin available from December 2023 was 12.2 g/dL. Most recent hemoglobin prior to arrival 8.5 g/dL. Not sure if the drop in hemoglobin is secondary to blood loss versus dilutional anemia. Will do a workup for anemia to primary team.  However once cleared would recommend angiography as discussed above.  Persistent atrial  fibrillation: Currently in sinus rhythm-bradycardia For rate control he was on carvedilol which is now discontinued secondary to bradycardia. For rhythm control was on amiodarone which was also held secondary to his bradycardia. Thromboembolic prophylaxis: Coumadin, history of PE/lupus anticoagulant Recommend restarting anticoagulation once the anemia workup is complete and the patient is cleared  Hypertension: Blood pressures are well-controlled. Will focus on diuresis for now.  Will uptitrate GDMT. Monitor for now.  Obstructive sleep apnea: He endorses compliance with CPAP.  Diabetes mellitus type 2: Reemphasized importance of glycemic control.  Management per primary team.  Cardiology will follow the patient with you over the weekend.  For questions or updates, please contact [facility] Please consult [email or web address] for contact info under     Signed,  Tessa Lerner, DO, [facility]  [facility]  [address] [place], [place] [address] Pager: [phone] Office: [phone] 11:09 AM 10/13/23

## 2023-10-13 NOTE — Progress Notes (Signed)
  Echocardiogram 2D Echocardiogram has been performed.  Eduardo Butler 10/13/2023, 2:29 PM

## 2023-10-14 DIAGNOSIS — R001 Bradycardia, unspecified: Secondary | ICD-10-CM | POA: Diagnosis not present

## 2023-10-14 DIAGNOSIS — D649 Anemia, unspecified: Secondary | ICD-10-CM | POA: Diagnosis not present

## 2023-10-14 DIAGNOSIS — N179 Acute kidney failure, unspecified: Secondary | ICD-10-CM | POA: Diagnosis not present

## 2023-10-14 LAB — CBC
HCT: 25.9 % — ABNORMAL LOW (ref 39.0–52.0)
Hemoglobin: 8.3 g/dL — ABNORMAL LOW (ref 13.0–17.0)
MCH: 27.5 pg (ref 26.0–34.0)
MCHC: 32 g/dL (ref 30.0–36.0)
MCV: 85.8 fL (ref 80.0–100.0)
Platelets: 165 10*3/uL (ref 150–400)
RBC: 3.02 MIL/uL — ABNORMAL LOW (ref 4.22–5.81)
RDW: 17.2 % — ABNORMAL HIGH (ref 11.5–15.5)
WBC: 4.7 10*3/uL (ref 4.0–10.5)
nRBC: 0 % (ref 0.0–0.2)

## 2023-10-14 LAB — COMPREHENSIVE METABOLIC PANEL
ALT: 33 U/L (ref 0–44)
AST: 42 U/L — ABNORMAL HIGH (ref 15–41)
Albumin: 3.2 g/dL — ABNORMAL LOW (ref 3.5–5.0)
Alkaline Phosphatase: 71 U/L (ref 38–126)
Anion gap: 7 (ref 5–15)
BUN: 19 mg/dL (ref 8–23)
CO2: 26 mmol/L (ref 22–32)
Calcium: 8.4 mg/dL — ABNORMAL LOW (ref 8.9–10.3)
Chloride: 99 mmol/L (ref 98–111)
Creatinine, Ser: 1.26 mg/dL — ABNORMAL HIGH (ref 0.61–1.24)
GFR, Estimated: >60 mL/min (ref >60)
Glucose, Bld: 96 mg/dL (ref 70–99)
Potassium: 3.7 mmol/L (ref 3.5–5.1)
Sodium: 132 mmol/L — ABNORMAL LOW (ref 135–145)
Total Bilirubin: 1.1 mg/dL (ref <1.2)
Total Protein: 5.8 g/dL — ABNORMAL LOW (ref 6.5–8.1)

## 2023-10-14 LAB — GLUCOSE, CAPILLARY
Glucose-Capillary: 101 mg/dL — ABNORMAL HIGH (ref 70–99)
Glucose-Capillary: 151 mg/dL — ABNORMAL HIGH (ref 70–99)
Glucose-Capillary: 114 mg/dL — ABNORMAL HIGH (ref 70–99)

## 2023-10-14 MED ORDER — FUROSEMIDE 10 MG/ML IJ SOLN
60.0000 mg | Freq: Two times a day (BID) | INTRAMUSCULAR | Status: DC
  Administered 2023-10-14 – 2023-10-15 (×2): 60 mg via INTRAVENOUS
  Filled 2023-10-14 (×3): qty 6

## 2023-10-14 NOTE — Progress Notes (Signed)
Progress Note  Patient Name: Eduardo Butler Date of Encounter: 10/14/2023 [facility] Cardiologist: Nanetta Batty, MD   Subjective   Eduardo Butler  presents with worsening heart failure symptoms compared to the previous year. He reports increased abdominal and lower extremity edema, but denies any bleeding issues. Despite a heart rate of fifty, he was able to ambulate without difficulty.  The patient's spouse notes that the edema is most prominent in the abdomen and lower extremities. The patient has been asymptomatic despite some evidence of chronotropic incompetency. He reports feeling less short of breath and has had no near syncope.   Vital Signs    Vitals:   10/14/23 0458 10/14/23 0800 10/14/23 0857 10/14/23 1243  BP: (!) 113/46   115/61  Pulse: (!) 46 (!) 43 (!) 48   Resp: 20   18  Temp: 97.6 F (36.4 C)   97.7 F (36.5 C)  TempSrc: Oral   Oral  SpO2: 100% 95% 94%   Weight:      Height:        Intake/Output Summary (Last 24 hours) at 10/14/2023 1256 Last data filed at 10/14/2023 0458 Gross per 24 hour  Intake 150 ml  Output 1551 ml  Net -1401 ml   Filed Weights   10/12/23 1101 10/12/23 1643  Weight: 109.8 kg 107.8 kg    Physical Exam   GEN: No acute distress.   Neck: JVD+ Cardiac: regular bradycardia, no murmurs, rubs, or gallops.  Respiratory: Clear to auscultation bilaterally. GI: Soft, nontender, distended with fluid wave MS: +2 edema bilaterally  Labs   Telemetry: Sinus bradycardia   Chemistry Recent Labs  Lab 10/11/23 1456 10/12/23 1106 10/13/23 0544 10/14/23 0145  NA 139 137 134* 132*  K 4.5 3.5 3.4* 3.7  CL 100 101 99 99  CO2 26 27 26 26   GLUCOSE 67* 108* 90 96  BUN 20 24* 21 19  CREATININE 1.52* 1.56* 1.37* 1.26*  CALCIUM 9.0 8.5* 8.5* 8.4*  PROT 6.0  --   --  5.8*  ALBUMIN 4.0  --   --  3.2*  AST 37  --   --  42*  ALT 27  --   --  33  ALKPHOS 94  --   --  71  BILITOT 0.7  --   --  1.1  GFRNONAA  --  47* 55* >60  ANIONGAP  --  9 9 7       Hematology Recent Labs  Lab 10/12/23 1106 10/12/23 1745 10/13/23 0544 10/14/23 0145  WBC 5.5  --  4.3 4.7  RBC 3.05* 3.04* 3.02* 3.02*  HGB 8.5*  --  8.3* 8.3*  HCT 27.2*  --  26.4* 25.9*  MCV 89.2  --  87.4 85.8  MCH 27.9  --  27.5 27.5  MCHC 31.3  --  31.4 32.0  RDW 17.4*  --  17.5* 17.2*  PLT 182  --  166 165    Cardiac EnzymesNo results for input(s): "TROPONINI" in the last 168 hours. No results for input(s): "TROPIPOC" in the last 168 hours.   BNP Recent Labs  Lab 10/12/23 1149  BNP 385.4*     DDimer No results for input(s): "DDIMER" in the last 168 hours.   Cardiac Studies   Cardiac Studies & Procedures   CARDIAC CATHETERIZATION  CARDIAC CATHETERIZATION 04/23/2020  Narrative Images from the original result were not included.   Prox RCA to Mid RCA lesion is 99% stenosed.  RV Branch lesion is 99% stenosed.  Ost LAD to Prox LAD lesion is 100% stenosed.  Prox Cx lesion is 95% stenosed.  Mid Cx lesion is 95% stenosed.  Eduardo Butler is a 72 y.o. male   [id number] LOCATION:  FACILITY: [facility] PHYSICIAN: Nanetta Batty, M.D. 11-23-1951   DATE OF PROCEDURE:  04/23/2020  DATE OF DISCHARGE:     CARDIAC CATHETERIZATION    History obtained from chart review.Eduardo Butler is a 72 y.o.  moderately overweight married Caucasian male, father of 2, grandfather to 2 grandchildren who I last saw in the office 11/07/2019.[name]  He is accompanied by his wife [name] today.  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 Clarene Duke follows. His other problems include hyperlipidemia and erectile dysfunction. I catheterized him at the [facility]  September 28th , 2007 revealing patent grafts with normal LV function .  This suggested that he had a false positive Myoview stress test and medical therapy was recommended.  His last Myoview performed 01/04/13 was nonischemic Dr. Izola Price follows his lipid profile. When I saw him 2 months ago he was complaining of dyspnea on exertion. He saw one of our PAs a month later with increasing dyspnea on exertion. His diastolic was cut in half but remains bradycardic although he was clinically improved. His when necessary hydrochlorothiazide was changed to when necessary Lasix which she took over the weekend for several doses which resulted in improved dyspnea and decreased edema. I did discuss with him today so restriction. A 2-D echo was ordered that showed normal FUNCTION in a Myoview stress test that showed apical thinning with new ischemia in the inferior wall compared to a previous Myoview 2 years ago. Since I saw him 3 months ago he continued to do well.  He says he is stronger.  I did adjust his diuretics which improved his lower extremity edema.  He also admits to dietary indiscretion.  He denies chest pain does have chronic dyspnea on exertion.  He has seen Azalee Course PA-C in the office 03/29/2020...  His major complaint is  dyspnea.  He has not lost any weight.  He denies chest pain.  An echo performed 03/07/2018 was completely normal.  He has been more dyspneic and weaker since I saw him back in December.  A Myoview stress test ordered by Azalee Course 04/08/2020 showed subtle anterior ischemia only mildly different from the last Myoview performed 7/17.  However, given the fact that his last cath was 14 years ago I am going to plan on performing diagnostic coronary angiography after a Lovenox bridge.  Impression Eduardo Butler has widely patent grafts with three-vessel native disease and normal filling pressures.  His echo revealed normal LV function.  I believe his dyspnea is multifactorial probably related to obesity and  deconditioning.  His grafts are amazingly free of disease 17 years status post bypass grafting in 14 years since his last cath.  A Mynx closure device was successfully deployed.  He will be discharged home this morning as an outpatient and will restart his Lovenox bridging outpatient Coumadin therapy.  He already has a follow-up appointment scheduled.  Nanetta Batty. MD, [facility] 04/23/2020 9:07 AM  Findings Coronary Findings Diagnostic  Dominance:  Right  Left Anterior Descending Ost LAD to Prox LAD lesion is 100% stenosed.  Left Circumflex Prox Cx lesion is 95% stenosed. Mid Cx lesion is 95% stenosed.  Right Coronary Artery Prox RCA to Mid RCA lesion is 99% stenosed.  Right Ventricular Branch RV Branch lesion is 99% stenosed.  LIMA Graft To Dist LAD  Graft To Dist Cx  Sequential Graft To RV Branch, Dist RCA  Intervention  No interventions have been documented.   STRESS TESTS  NM PET CT CARDIAC PERFUSION MULTI W/ABSOLUTE BLOODFLOW 09/27/2023  Narrative   Small, mild, fixed defect in the mid lateral wall consistent with prior infarction. No ischemia. MBFR not reported due to prior CABG. Low-risk study.   LV perfusion is abnormal. There is no evidence of ischemia. There is evidence of infarction. Defect 1: There is a small defect with mild reduction in uptake present in the mid lateral location(s) that is fixed. There is normal wall motion in the defect area. Consistent with infarction. The defect is consistent with abnormal perfusion in the LCx territory.   Rest left ventricular function is normal. Rest EF: 45%. Stress left ventricular function is normal. Stress EF: 55%. End diastolic cavity size is mildly enlarged.   Myocardial blood flow reserve is not reported in this patient due to technical or patient-specific concerns that affect accuracy.   Coronary calcium assessment not performed due to prior revascularization.   Findings are consistent with infarction. The study is low  risk.  Electronically signed by Lennie Odor, MD _________________________________________________________________________________________________________  CLINICAL DATA:  This over-read does not include interpretation of cardiac or coronary anatomy or pathology. The cardiac PET interpretation by the cardiologist is attached.  COMPARISON:  None Available.  FINDINGS: Small left pleural effusion lying dependently. No right pleural effusion. Cardiomegaly with atherosclerotic calcifications in the thoracic aorta as well as the left main, left anterior descending, left circumflex and right coronary arteries. Status post median sternotomy for CABG including LIMA to the LAD. Within the visualized portions of the thorax there are no suspicious appearing pulmonary nodules or masses, there is no acute consolidative airspace disease, no pneumothorax and no lymphadenopathy. Visualized portions of the upper abdomen are unremarkable. There are no aggressive appearing lytic or blastic lesions noted in the visualized portions of the skeleton.  IMPRESSION: 1. Small left pleural effusion. 2. Cardiomegaly. 3. Atherosclerosis of the thoracic aorta as well as the left main, left anterior descending, left circumflex and right coronary arteries. Patient is status post median sternotomy for CABG including LIMA to the LAD.   Electronically Signed By: Trudie Reed M.D. On: 09/27/2023 14:10   ECHOCARDIOGRAM  ECHOCARDIOGRAM COMPLETE 10/13/2023  Narrative ECHOCARDIOGRAM REPORT    Patient Name:   FINAS OLAVE Date of Exam: 10/13/2023 Medical Rec #:  [id number]    Height:       66.0 in Accession #:    [id number]   Weight:       237.7 lb Date of Birth:  September 21, 1951     BSA:          2.152 m Patient Age:    72 years     BP:           121/59 mmHg Patient Gender: M            HR:           43 bpm. Exam Location:  Inpatient  Procedure: 2D Echo, Cardiac Doppler and Color Doppler  Indications:     Abnormal ECG  History:        Patient has prior history of Echocardiogram examinations, most recent 04/21/2020. CHF, CAD, Arrythmias:Atrial Fibrillation, Signs/Symptoms:Shortness of Breath; Risk Factors:Hypertension, Dyslipidemia, Diabetes and Obesity.  Sonographer:    Milda Smart Referring Phys: [id number] CHELSEY L ANDERSON  IMPRESSIONS   1. Left ventricular ejection fraction, by estimation, is 55 to 60%. The left ventricle has normal function. The left ventricle has no regional wall motion abnormalities. Left ventricular diastolic function could not be evaluated. 2. Right ventricular systolic function is mildly reduced. The right ventricular size is mildly enlarged. There is mildly elevated pulmonary artery systolic pressure. The estimated right ventricular systolic pressure is 41.4 mmHg. 3. The mitral valve is normal in structure. Trivial mitral valve regurgitation. No evidence of mitral stenosis. 4. The aortic valve is tricuspid. Aortic valve regurgitation is not visualized. Aortic valve sclerosis/calcification is present, without any evidence of aortic stenosis. 5. The inferior vena cava is dilated in size with <50% respiratory variability, suggesting right atrial pressure of 15 mmHg.  FINDINGS Left Ventricle: Left ventricular ejection fraction, by estimation, is 55 to 60%. The left ventricle has normal function. The left ventricle has no regional wall motion abnormalities. The left ventricular internal cavity size was normal in size. There is no left ventricular hypertrophy. Left ventricular diastolic function could not be evaluated due to atrial fibrillation. Left ventricular diastolic function could not be evaluated.  Right Ventricle: The right ventricular size is mildly enlarged. No increase in right ventricular wall thickness. Right ventricular systolic function is mildly reduced. There is mildly elevated pulmonary artery systolic pressure. The tricuspid regurgitant velocity is  2.57 m/s, and with an assumed right atrial pressure of 15 mmHg, the estimated right ventricular systolic pressure is 41.4 mmHg.  Left Atrium: Left atrial size was normal in size.  Right Atrium: Right atrial size was normal in size.  Pericardium: There is no evidence of pericardial effusion.  Mitral Valve: The mitral valve is normal in structure. Mild mitral annular calcification. Trivial mitral valve regurgitation. No evidence of mitral valve stenosis. MV peak gradient, 6.0 mmHg. The mean mitral valve gradient is 1.0 mmHg.  Tricuspid Valve: The tricuspid valve is normal in structure. Tricuspid valve regurgitation is mild . No evidence of tricuspid stenosis.  Aortic Valve: The aortic valve is tricuspid. Aortic valve regurgitation is not visualized. Aortic valve sclerosis/calcification is present, without any evidence of aortic stenosis.  Pulmonic Valve: The pulmonic valve was normal in structure. Pulmonic valve regurgitation is trivial. No evidence of pulmonic stenosis.  Aorta: The aortic root is normal in size and structure.  Venous: The inferior vena cava is dilated in size with less than 50% respiratory variability, suggesting right atrial pressure of 15 mmHg.  IAS/Shunts: No atrial level shunt detected by color flow Doppler.   LEFT VENTRICLE PLAX 2D LVIDd:         5.00 cm      Diastology LVIDs:         3.70 cm      LV e' medial:    5.87 cm/s LV PW:         1.00 cm      LV E/e' medial:  20.6 LV IVS:        0.70 cm      LV e' lateral:   7.40 cm/s LVOT diam:     2.00 cm      LV E/e' lateral: 16.4 LV SV:         86 LV SV Index:  40 LVOT Area:     3.14 cm  LV Volumes (MOD) LV vol d, MOD A2C: 130.0 ml LV vol d, MOD A4C: 128.0 ml LV vol s, MOD A2C: 42.6 ml LV vol s, MOD A4C: 45.3 ml LV SV MOD A2C:     87.4 ml LV SV MOD A4C:     128.0 ml LV SV MOD BP:      86.2 ml  RIGHT VENTRICLE RV Basal diam:  4.20 cm RV S prime:     10.80 cm/s TAPSE (M-mode): 1.4 cm  LEFT ATRIUM              Index        RIGHT ATRIUM           Index LA diam:        3.20 cm 1.49 cm/m   RA Area:     21.60 cm LA Vol (A2C):   63.2 ml 29.37 ml/m  RA Volume:   60.10 ml  27.93 ml/m LA Vol (A4C):   64.8 ml 30.11 ml/m LA Biplane Vol: 69.6 ml 32.34 ml/m AORTIC VALVE LVOT Vmax:   112.00 cm/s LVOT Vmean:  74.700 cm/s LVOT VTI:    0.273 m  AORTA Ao Root diam: 3.30 cm Ao Asc diam:  3.60 cm  MITRAL VALVE                TRICUSPID VALVE MV Area (PHT): 5.20 cm     TR Peak grad:   26.4 mmHg MV Area VTI:   1.92 cm     TR Mean grad:   21.0 mmHg MV Peak grad:  6.0 mmHg     TR Vmax:        257.00 cm/s MV Mean grad:  1.0 mmHg     TR Vmean:       220.0 cm/s MV Vmax:       1.22 m/s MV Vmean:      53.0 cm/s    SHUNTS MV Decel Time: 146 msec     Systemic VTI:  0.27 m MR Peak grad: 43.7 mmHg     Systemic Diam: 2.00 cm MR Vmax:      330.67 cm/s MV E velocity: 121.00 cm/s  Armanda Magic MD Electronically signed by Armanda Magic MD Signature Date/Time: 10/13/2023/3:26:11 PM    Final    MONITORS  LONG TERM MONITOR (3-14 DAYS) 11/18/2020  Narrative 1. SR/SB/ST 2. Occasional PACs and PVCs 3. Short runs of SVT and NSVT 4. PAF with slow and RVR 4. Needs ROV to discuss             Assessment & Plan   Acute on Chronic Heart Failure with Preserved Ejection Fraction (HFpEF) Exacerbation - Worsening heart failure symptoms compared to last year's admission. Notable abdominal distension and lower extremity edema. Stable creatinine- echo stable.  - Responded well to Lasix 40 mg IV BID. Less shortness of breath, no near syncope, ambulating well despite heart rate of 50. - Increase Lasix to 60 mg IV today - Consider adding SGLT2 inhibitor  Coronary Artery Disease; Obstructive - asymptomatic on current therapy on my exam- anatomy: 4 V CABG no L radial access - planned for LHC and RHC when euvolemic if hgb stable; will honor that plan by previous cardiologist; ASA may be Dc'ed if no new obstructive  disease, no angina on my exam today  Persistent Bradycardia - Regular bradycardia with heart rate of 50 with ambulation.  Chronotropic incompetence noted but asymptomatic. Coreg held  Persistent Atrial Fibrillation - Status post cardioversion, currently in sinus bradycardia. Plan to restart Lovenox as patient approaches heart catheterization. Cleared for angiography by primary  medicine team. - Restart Lovenox - low dose amiodarone  Iron Deficiency Anemia - Plan to evaluate for iron deficiency anemia. Treating with IV iron may improve long-term symptoms and longevity. Evaluation to be coordinated with primary maternal medicine team. - Order iron deficiency anemia evaluation - Consider IV iron treatment  Type 2 Diabetes Mellitus On Ozempic at home. May benefit from SGLT2 inhibitor as above.  Hyperlipidemia On statins and Zetia.   Sleep Apnea On CPAP therapy.    For questions or updates, please contact [facility] Please consult [email or web address] for contact info under Cardiology/STEMI.      Riley Lam, MD FASE [facility] Cardiologist [facility]  [address] [place], [address] [place], [place] [address] [phone]  12:56 PM

## 2023-10-14 NOTE — Progress Notes (Signed)
Progress Note   Patient: Eduardo Butler ZOX:[id number] DOB: [birth year] DOA: 10/12/2023     2 DOS: the patient was seen and examined on 10/14/2023   Brief hospital course: 72 y.o. male with a PMH significant for HLD, CAD, anxiety, PE on Coumadin, HTN, lupus anticoagulant positive, PAF, OSA, type 2 diabetes mellitus, obesity. Pt presented from home to the ED on 10/12/2023 due to the guidance of their cardiologist in setting of anemia and AKI on outpatient labs. Pt also had chest pains PTA. Pt had been taking ozempic since 2/24 with GI symptoms starting about 2-3 months later. Pt reports dark brown stools, not black, red, or maroon.  In the ED, pt was found to have HR in the 40's. Stool was heme neg. Hgb of 8.3. Cardiology was consulted.   Assessment and Plan: Bradycardia -heart rate in the 40s at presentation -Cardiology following -TSH 1.807 -cont to hold Beta blocker and amio. Per Cardiology, consider lower dose of amio prior to d/c -cont to monitor on tele   AKI - hypervolemic on exam with 3+ LE edema and pulmonary congestion with small left pleural effusion. recently started ozempic and drinking kool-aid with splenda to increase his fluid intake. Baseline Cr 10 months ago was normal at 0.9. 1.5 on presentation today and in office yesterday.  - Pt is continued on IV lasix -Cr improved this AM -Recheck bmet in AM   Anemia secondary to B12 deficiency -No obvious sign of blood loss. Stool is heme neg on admit. Pt reports dark brown stool, no melena or red stools -Iron level normal, folate normal -B12 is low at 169 -Have ordered daily b12 injections, would plan for 7 days, then once monthly -continue to monitor closely for blood loss, although doubt blood loss at this point -currently off coumadin   HTN-  -Currently well controlled - holding home losartan in setting of AKI   OSA- -CPAP nightly   Type II diabetes mellitus -Hemoglobin A1c 6.2 -Sensitive sliding scale insulin while in  hospital -cont to hold home glycemic agents  ARF -Baseline Cr noted to be <1 -Presenting Cr in excess of 1.5 -Cr is trending down after holding ARB -recheck bmet in AM   CAD-history of CABG 2003.   -Continue aspirin, Zetia, rosuvastatin -Discussed with Cardiology. Tentative plan for heart cath on 11/18 once stable from a hematologic standpoint   Anxiety- chronic and not in acute flare - continue home anxiolytics PRN at bedtime    Subjective: Reports passing normal brown stools recently, no evidence of blood  Physical Exam: Vitals:   10/14/23 0458 10/14/23 0800 10/14/23 0857 10/14/23 1243  BP: (!) 113/46   115/61  Pulse: (!) 46 (!) 43 (!) 48   Resp: 20   18  Temp: 97.6 F (36.4 C)   97.7 F (36.5 C)  TempSrc: Oral   Oral  SpO2: 100% 95% 94%   Weight:      Height:       General exam: Conversant, in no acute distress Respiratory system: normal chest rise, clear, no audible wheezing Cardiovascular system: regular rhythm, s1-s2 Gastrointestinal system: Nondistended, nontender, pos BS Central nervous system: No seizures, no tremors Extremities: No cyanosis, no joint deformities Skin: No rashes, no pallor Psychiatry: Affect normal // no auditory hallucinations   Data Reviewed:  Labs reviewed: Na 132, K 3.7, Cr 1.26, WBC 4.7, Hgb 8.3, Plts 165   Family Communication: Pt in room, family at bedside  Disposition: Status is: Inpatient Remains inpatient appropriate because: severity  of illness  Planned Discharge Destination: Home    Author: Rickey Barbara, MD 10/14/2023 4:30 PM  For on call review [email or web address].

## 2023-10-14 NOTE — Progress Notes (Signed)
Patient declines CPAP. No unit in room at this time. 

## 2023-10-15 DIAGNOSIS — D649 Anemia, unspecified: Secondary | ICD-10-CM | POA: Diagnosis not present

## 2023-10-15 DIAGNOSIS — N179 Acute kidney failure, unspecified: Secondary | ICD-10-CM | POA: Diagnosis not present

## 2023-10-15 DIAGNOSIS — R001 Bradycardia, unspecified: Secondary | ICD-10-CM | POA: Diagnosis not present

## 2023-10-15 LAB — CBC
HCT: 27.3 % — ABNORMAL LOW (ref 39.0–52.0)
Hemoglobin: 8.5 g/dL — ABNORMAL LOW (ref 13.0–17.0)
MCH: 27.3 pg (ref 26.0–34.0)
MCHC: 31.1 g/dL (ref 30.0–36.0)
MCV: 87.8 fL (ref 80.0–100.0)
Platelets: 179 10*3/uL (ref 150–400)
RBC: 3.11 MIL/uL — ABNORMAL LOW (ref 4.22–5.81)
RDW: 17.4 % — ABNORMAL HIGH (ref 11.5–15.5)
WBC: 4.6 10*3/uL (ref 4.0–10.5)
nRBC: 0 % (ref 0.0–0.2)

## 2023-10-15 LAB — VITAMIN B12: Vitamin B-12: 6842 pg/mL — ABNORMAL HIGH (ref 180–914)

## 2023-10-15 LAB — IRON AND TIBC
Iron: 51 ug/dL (ref 45–182)
Saturation Ratios: 13 % — ABNORMAL LOW (ref 17.9–39.5)
TIBC: 407 ug/dL (ref 250–450)
UIBC: 356 ug/dL

## 2023-10-15 LAB — RETICULOCYTES
Immature Retic Fract: 18.7 % — ABNORMAL HIGH (ref 2.3–15.9)
RBC.: 3.2 MIL/uL — ABNORMAL LOW (ref 4.22–5.81)
Retic Count, Absolute: 42.2 10*3/uL (ref 19.0–186.0)
Retic Ct Pct: 1.3 % (ref 0.4–3.1)

## 2023-10-15 LAB — FERRITIN: Ferritin: 68 ng/mL (ref 24–336)

## 2023-10-15 LAB — GLUCOSE, CAPILLARY
Glucose-Capillary: 104 mg/dL — ABNORMAL HIGH (ref 70–99)
Glucose-Capillary: 135 mg/dL — ABNORMAL HIGH (ref 70–99)
Glucose-Capillary: 117 mg/dL — ABNORMAL HIGH (ref 70–99)
Glucose-Capillary: 109 mg/dL — ABNORMAL HIGH (ref 70–99)

## 2023-10-15 LAB — FOLATE: Folate: 25.7 ng/mL (ref >5.9)

## 2023-10-15 MED ORDER — FUROSEMIDE 10 MG/ML IJ SOLN
80.0000 mg | Freq: Two times a day (BID) | INTRAMUSCULAR | Status: DC
  Administered 2023-10-15 – 2023-10-16 (×2): 80 mg via INTRAVENOUS
  Filled 2023-10-15 (×2): qty 8

## 2023-10-15 NOTE — Progress Notes (Signed)
Progress Note  Patient Name: Eduardo Butler Date of Encounter: 10/15/2023 Primary Cardiologist: Eduardo Batty, MD   Subjective   Increased volume removal in interval Creatinine continues to improved  Vital Signs    Vitals:   10/14/23 1243 10/14/23 2128 10/14/23 2158 10/15/23 0401  BP: 115/61 (!) 103/52  (!) 103/56  Pulse:   (!) 45 (!) 44  Resp: 18 18  17   Temp: 97.7 F (36.5 C) 98.7 F (37.1 C)  98.4 F (36.9 C)  TempSrc: Oral Oral  Oral  SpO2:   97% 100%  Weight:      Height:        Intake/Output Summary (Last 24 hours) at 10/15/2023 1156 Last data filed at 10/15/2023 0644 Gross per 24 hour  Intake --  Output 2950 ml  Net -2950 ml   Filed Weights   10/12/23 1101 10/12/23 1643  Weight: 109.8 kg 107.8 kg    Physical Exam   GEN: No acute distress.   Neck: JVD+ Cardiac: regular bradycardia, no murmurs, rubs, or gallops.  Respiratory: Clear to auscultation bilaterally. GI: Soft, nontender, distended with fluid wave MS: +2 edema bilaterally  Labs   Telemetry: Sinus bradycardia   Chemistry Recent Labs  Lab 10/11/23 1456 10/12/23 1106 10/13/23 0544 10/14/23 0145  NA 139 137 134* 132*  K 4.5 3.5 3.4* 3.7  CL 100 101 99 99  CO2 26 27 26 26   GLUCOSE 67* 108* 90 96  BUN 20 24* 21 19  CREATININE 1.52* 1.56* 1.37* 1.26*  CALCIUM 9.0 8.5* 8.5* 8.4*  PROT 6.0  --   --  5.8*  ALBUMIN 4.0  --   --  3.2*  AST 37  --   --  42*  ALT 27  --   --  33  ALKPHOS 94  --   --  71  BILITOT 0.7  --   --  1.1  GFRNONAA  --  47* 55* >60  ANIONGAP  --  9 9 7      Hematology Recent Labs  Lab 10/13/23 0544 10/14/23 0145 10/15/23 0548 10/15/23 1116  WBC 4.3 4.7  --  4.6  RBC 3.02* 3.02* 3.20* 3.11*  HGB 8.3* 8.3*  --  8.5*  HCT 26.4* 25.9*  --  27.3*  MCV 87.4 85.8  --  87.8  MCH 27.5 27.5  --  27.3  MCHC 31.4 32.0  --  31.1  RDW 17.5* 17.2*  --  17.4*  PLT 166 165  --  179    Cardiac EnzymesNo results for input(s): "TROPONINI" in the last 168 hours. No  results for input(s): "TROPIPOC" in the last 168 hours.   BNP Recent Labs  Lab 10/12/23 1149  BNP 385.4*     DDimer No results for input(s): "DDIMER" in the last 168 hours.   Cardiac Studies   Cardiac Studies & Procedures   CARDIAC CATHETERIZATION  CARDIAC CATHETERIZATION 04/23/2020  Narrative Images from the original result were not included.   Prox RCA to Mid RCA lesion is 99% stenosed.  RV Branch lesion is 99% stenosed.  Ost LAD to Prox LAD lesion is 100% stenosed.  Prox Cx lesion is 95% stenosed.  Mid Cx lesion is 95% stenosed.  Eduardo Butler is a 72 y.o. male   [id number] LOCATION:  FACILITY: [facility] PHYSICIAN: Eduardo Butler, M.D. October 28, 1951   DATE OF PROCEDURE:  04/23/2020  DATE OF DISCHARGE:     CARDIAC CATHETERIZATION    History obtained from chart review.Eduardo Church  Butler is a 72 y.o.  moderately overweight married Caucasian male, father of 2, grandfather to 2 grandchildren who I last saw in the office 11/07/2019.[name]  He is accompanied by his wife Eduardo Butler today.  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 Clarene Butler follows. His other problems include hyperlipidemia and erectile dysfunction. I catheterized him at the [facility] September 28th , 2007 revealing patent grafts with normal LV function .  This suggested that he had a false positive Myoview stress test and medical therapy was recommended.  His last Myoview performed 01/04/13 was nonischemic Dr. Izola Price follows his lipid profile. When I saw him 2 months ago he was complaining of dyspnea on exertion. He saw one of our PAs a month later with increasing dyspnea on exertion. His diastolic  was cut in half but remains bradycardic although he was clinically improved. His when necessary hydrochlorothiazide was changed to when necessary Lasix which she took over the weekend for several doses which resulted in improved dyspnea and decreased edema. I did discuss with him today so restriction. A 2-D echo was ordered that showed normal FUNCTION in a Myoview stress test that showed apical thinning with new ischemia in the inferior wall compared to a previous Myoview 2 years ago. Since I saw him 3 months ago he continued to do well.  He says he is stronger.  I did adjust his diuretics which improved his lower extremity edema.  He also admits to dietary indiscretion.  He denies chest pain does have chronic dyspnea on exertion.  He has seen Azalee Course PA-C in the office 03/29/2020...  His major complaint is  dyspnea.  He has not lost any weight.  He denies chest pain.  An echo performed 03/07/2018 was completely normal.  He has been more dyspneic and weaker since I saw him back in December.  A Myoview stress test ordered by Azalee Course 04/08/2020 showed subtle anterior ischemia only mildly different from the last Myoview performed 7/17.  However, given the fact that his last cath was 14 years ago I am going to plan on performing diagnostic coronary angiography after a Lovenox bridge.  Impression [name] has widely patent grafts with three-vessel native disease and normal filling pressures.  His echo revealed normal LV function.  I believe his dyspnea is multifactorial probably related to obesity and deconditioning.  His grafts are amazingly free of disease 17 years status post bypass grafting in 14 years since his last cath.  A Mynx closure device was successfully deployed.  He will be discharged home this morning as an outpatient and will restart his Lovenox bridging outpatient Coumadin therapy.  He already has a follow-up appointment scheduled.  Eduardo Butler. MD, [facility] 04/23/2020 9:07 AM  Findings Coronary  Findings Diagnostic  Dominance: Right  Left Anterior Descending Ost LAD to Prox LAD lesion is 100% stenosed.  Left Circumflex Prox Cx lesion is 95% stenosed. Mid Cx lesion is 95% stenosed.  Right Coronary Artery Prox RCA to Mid RCA lesion is 99% stenosed.  Right Ventricular Branch RV Branch lesion is 99% stenosed.  LIMA Graft To Dist LAD  Graft To Dist Cx  Sequential Graft To RV Branch, Dist  RCA  Intervention  No interventions have been documented.   STRESS TESTS  NM PET CT CARDIAC PERFUSION MULTI W/ABSOLUTE BLOODFLOW 09/27/2023  Narrative   Small, mild, fixed defect in the mid lateral wall consistent with prior infarction. No ischemia. MBFR not reported due to prior CABG. Low-risk study.   LV perfusion is abnormal. There is no evidence of ischemia. There is evidence of infarction. Defect 1: There is a small defect with mild reduction in uptake present in the mid lateral location(s) that is fixed. There is normal wall motion in the defect area. Consistent with infarction. The defect is consistent with abnormal perfusion in the LCx territory.   Rest left ventricular function is normal. Rest EF: 45%. Stress left ventricular function is normal. Stress EF: 55%. End diastolic cavity size is mildly enlarged.   Myocardial blood flow reserve is not reported in this patient due to technical or patient-specific concerns that affect accuracy.   Coronary calcium assessment not performed due to prior revascularization.   Findings are consistent with infarction. The study is low risk.  Electronically signed by Lennie Odor, MD _________________________________________________________________________________________________________  CLINICAL DATA:  This over-read does not include interpretation of cardiac or coronary anatomy or pathology. The cardiac PET interpretation by the cardiologist is attached.  COMPARISON:  None Available.  FINDINGS: Small left pleural effusion lying  dependently. No right pleural effusion. Cardiomegaly with atherosclerotic calcifications in the thoracic aorta as well as the left main, left anterior descending, left circumflex and right coronary arteries. Status post median sternotomy for CABG including LIMA to the LAD. Within the visualized portions of the thorax there are no suspicious appearing pulmonary nodules or masses, there is no acute consolidative airspace disease, no pneumothorax and no lymphadenopathy. Visualized portions of the upper abdomen are unremarkable. There are no aggressive appearing lytic or blastic lesions noted in the visualized portions of the skeleton.  IMPRESSION: 1. Small left pleural effusion. 2. Cardiomegaly. 3. Atherosclerosis of the thoracic aorta as well as the left main, left anterior descending, left circumflex and right coronary arteries. Patient is status post median sternotomy for CABG including LIMA to the LAD.   Electronically Signed By: Trudie Reed M.D. On: 09/27/2023 14:10   ECHOCARDIOGRAM  ECHOCARDIOGRAM COMPLETE 10/13/2023  Narrative ECHOCARDIOGRAM REPORT    Patient Name:   GORO SHOWELL Date of Exam: 10/13/2023 Medical Rec #:  [id number]    Height:       66.0 in Accession #:    [id number]   Weight:       237.7 lb Date of Birth:  [birth year]     BSA:          2.152 m Patient Age:    72 years     BP:           121/59 mmHg Patient Gender: M            HR:           43 bpm. Exam Location:  Inpatient  Procedure: 2D Echo, Cardiac Doppler and Color Doppler  Indications:    Abnormal ECG  History:        Patient has prior history of Echocardiogram examinations, most recent 04/21/2020. CHF, CAD, Arrythmias:Atrial Fibrillation, Signs/Symptoms:Shortness of Breath; Risk Factors:Hypertension, Dyslipidemia, Diabetes and Obesity.  Sonographer:    Milda Smart Referring Phys: [id number] CHELSEY L ANDERSON  IMPRESSIONS   1. Left ventricular ejection fraction, by estimation, is 55  to 60%. The left ventricle has normal function. The left ventricle has no  regional wall motion abnormalities. Left ventricular diastolic function could not be evaluated. 2. Right ventricular systolic function is mildly reduced. The right ventricular size is mildly enlarged. There is mildly elevated pulmonary artery systolic pressure. The estimated right ventricular systolic pressure is 41.4 mmHg. 3. The mitral valve is normal in structure. Trivial mitral valve regurgitation. No evidence of mitral stenosis. 4. The aortic valve is tricuspid. Aortic valve regurgitation is not visualized. Aortic valve sclerosis/calcification is present, without any evidence of aortic stenosis. 5. The inferior vena cava is dilated in size with <50% respiratory variability, suggesting right atrial pressure of 15 mmHg.  FINDINGS Left Ventricle: Left ventricular ejection fraction, by estimation, is 55 to 60%. The left ventricle has normal function. The left ventricle has no regional wall motion abnormalities. The left ventricular internal cavity size was normal in size. There is no left ventricular hypertrophy. Left ventricular diastolic function could not be evaluated due to atrial fibrillation. Left ventricular diastolic function could not be evaluated.  Right Ventricle: The right ventricular size is mildly enlarged. No increase in right ventricular wall thickness. Right ventricular systolic function is mildly reduced. There is mildly elevated pulmonary artery systolic pressure. The tricuspid regurgitant velocity is 2.57 m/s, and with an assumed right atrial pressure of 15 mmHg, the estimated right ventricular systolic pressure is 41.4 mmHg.  Left Atrium: Left atrial size was normal in size.  Right Atrium: Right atrial size was normal in size.  Pericardium: There is no evidence of pericardial effusion.  Mitral Valve: The mitral valve is normal in structure. Mild mitral annular calcification. Trivial mitral valve  regurgitation. No evidence of mitral valve stenosis. MV peak gradient, 6.0 mmHg. The mean mitral valve gradient is 1.0 mmHg.  Tricuspid Valve: The tricuspid valve is normal in structure. Tricuspid valve regurgitation is mild . No evidence of tricuspid stenosis.  Aortic Valve: The aortic valve is tricuspid. Aortic valve regurgitation is not visualized. Aortic valve sclerosis/calcification is present, without any evidence of aortic stenosis.  Pulmonic Valve: The pulmonic valve was normal in structure. Pulmonic valve regurgitation is trivial. No evidence of pulmonic stenosis.  Aorta: The aortic root is normal in size and structure.  Venous: The inferior vena cava is dilated in size with less than 50% respiratory variability, suggesting right atrial pressure of 15 mmHg.  IAS/Shunts: No atrial level shunt detected by color flow Doppler.   LEFT VENTRICLE PLAX 2D LVIDd:         5.00 cm      Diastology LVIDs:         3.70 cm      LV e' medial:    5.87 cm/s LV PW:         1.00 cm      LV E/e' medial:  20.6 LV IVS:        0.70 cm      LV e' lateral:   7.40 cm/s LVOT diam:     2.00 cm      LV E/e' lateral: 16.4 LV SV:         86 LV SV Index:   40 LVOT Area:     3.14 cm  LV Volumes (MOD) LV vol d, MOD A2C: 130.0 ml LV vol d, MOD A4C: 128.0 ml LV vol s, MOD A2C: 42.6 ml LV vol s, MOD A4C: 45.3 ml LV SV MOD A2C:     87.4 ml LV SV MOD A4C:     128.0 ml LV SV MOD BP:  86.2 ml  RIGHT VENTRICLE RV Basal diam:  4.20 cm RV S prime:     10.80 cm/s TAPSE (M-mode): 1.4 cm  LEFT ATRIUM             Index        RIGHT ATRIUM           Index LA diam:        3.20 cm 1.49 cm/m   RA Area:     21.60 cm LA Vol (A2C):   63.2 ml 29.37 ml/m  RA Volume:   60.10 ml  27.93 ml/m LA Vol (A4C):   64.8 ml 30.11 ml/m LA Biplane Vol: 69.6 ml 32.34 ml/m AORTIC VALVE LVOT Vmax:   112.00 cm/s LVOT Vmean:  74.700 cm/s LVOT VTI:    0.273 m  AORTA Ao Root diam: 3.30 cm Ao Asc diam:  3.60 cm  MITRAL  VALVE                TRICUSPID VALVE MV Area (PHT): 5.20 cm     TR Peak grad:   26.4 mmHg MV Area VTI:   1.92 cm     TR Mean grad:   21.0 mmHg MV Peak grad:  6.0 mmHg     TR Vmax:        257.00 cm/s MV Mean grad:  1.0 mmHg     TR Vmean:       220.0 cm/s MV Vmax:       1.22 m/s MV Vmean:      53.0 cm/s    SHUNTS MV Decel Time: 146 msec     Systemic VTI:  0.27 m MR Peak grad: 43.7 mmHg     Systemic Diam: 2.00 cm MR Vmax:      330.67 cm/s MV E velocity: 121.00 cm/s  Armanda Magic MD Electronically signed by Armanda Magic MD Signature Date/Time: 10/13/2023/3:26:11 PM    Final    MONITORS  LONG TERM MONITOR (3-14 DAYS) 11/18/2020  Narrative 1. SR/SB/ST 2. Occasional PACs and PVCs 3. Short runs of SVT and NSVT 4. PAF with slow and RVR 4. Needs ROV to discuss             Assessment & Plan   Acute on Chronic Heart Failure with Preserved Ejection Fraction (HFpEF) Exacerbation - Worsening heart failure symptoms compared to last year's admission. Notable abdominal distension and lower extremity edema. Stable creatinine- echo stable. Mild RV dysfunction - Increase Lasix to 80 mg IV today - Consider adding SGLT2 inhibitor through course - Reviewed his prior testing for wife, his daughter is an Charity fundraiser  Coronary Artery Disease; Obstructive - asymptomatic on current therapy on my exam- anatomy: 4 V CABG no L radial access - planned for LHC and RHC when euvolemic if hgb stable; will honor that plan by previous cardiologist; ASA may be Dc'ed if no new obstructive disease - will defer LHC/RHC until he is near euvolemic; he is presently still notable volume overloaded  Persistent Bradycardia - Regular bradycardia with heart rate of 50 with ambulation.  Chronotropic incompetence noted but asymptomatic. Coreg held  Persistent Atrial Fibrillation - Status post cardioversion, currently in sinus bradycardia. Plan to restart Lovenox as patient approaches heart catheterization. Cleared for  angiography by primary  medicine team. - Restart Lovenox - low dose amiodarone  Iron Deficiency Anemia - Plan to evaluate for iron deficiency anemia. Treating with IV iron may improve long-term symptoms and longevity. Evaluation to be coordinated with primary maternal medicine team. -  low normal ferritin - Consider IV iron treatment  Type 2 Diabetes Mellitus On Ozempic at home. May benefit from SGLT2 inhibitor as above.  Hyperlipidemia On statins and Zetia.   Sleep Apnea On CPAP therapy.    For questions or updates, please contact [facility] Please consult [email or web address] for contact info under Cardiology/STEMI.      Riley Lam, MD FASE [facility] Cardiologist [facility]  [address] [place], [address] [place], [place] [address] [phone]  11:56 AM

## 2023-10-15 NOTE — Progress Notes (Signed)
Progress Note   Patient: Eduardo Butler:[id number] DOB: 1951-04-30 DOA: 10/12/2023     3 DOS: the patient was seen and examined on 10/15/2023   Brief hospital course: 72 y.o. male with a PMH significant for HLD, CAD, anxiety, PE on Coumadin, HTN, lupus anticoagulant positive, PAF, OSA, type 2 diabetes mellitus, obesity. Pt presented from home to the ED on 10/12/2023 due to the guidance of their cardiologist in setting of anemia and AKI on outpatient labs. Pt also had chest pains PTA. Pt had been taking ozempic since 2/24 with GI symptoms starting about 2-3 months later. Pt reports dark brown stools, not black, red, or maroon.  In the ED, pt was found to have HR in the 40's. Stool was heme neg. Hgb of 8.3. Cardiology was consulted.   Assessment and Plan: Bradycardia -heart rate in the 40s at presentation -Cardiology following -TSH 1.807 -cont to hold Beta blocker and amio. Per Cardiology, consider lower dose of amio prior to d/c -cont to monitor on tele   AKI - hypervolemic on exam with 3+ LE edema and pulmonary congestion with small left pleural effusion. recently started ozempic and drinking kool-aid with splenda to increase his fluid intake. Baseline Cr 10 months ago was normal at 0.9. 1.5 on presentation today and in office yesterday.  - Pt is continued on IV lasix -Cr improved this AM -Recheck bmet in AM   Anemia secondary to B12 deficiency and likely iron deficiency -No obvious sign of blood loss. Stool is heme neg on admit. Pt reports dark brown stool, no melena or red stools -Iron level normal, folate normal -B12 is low at 169 -Iron sat low -Have ordered daily b12 injections, would plan for 7 days, then once monthly -continue to monitor closely for blood loss, although doubt blood loss at this point -currently off coumadin -consider iron replacement   HTN-  -Currently well controlled - holding home losartan in setting of AKI   OSA- -CPAP nightly   Type II diabetes  mellitus -Hemoglobin A1c 6.2 -Sensitive sliding scale insulin while in hospital -cont to hold home glycemic agents  ARF -Baseline Cr noted to be <1 -Presenting Cr in excess of 1.5 -Cr is trending down after holding ARB -recheck bmet in AM   CAD-history of CABG 2003.   -Continue aspirin, Zetia, rosuvastatin -Discussed with Cardiology. Tentative plan for heart cath on 11/18 once stable from a hematologic standpoint and when euvolemic   Anxiety- chronic and not in acute flare - continue home anxiolytics PRN at bedtime    Subjective: No evidence of blood loss still.  Physical Exam: Vitals:   10/14/23 2158 10/15/23 0401 10/15/23 0848 10/15/23 1432  BP:  (!) 103/56 110/78 (!) 102/57  Pulse: (!) 45 (!) 44    Resp:  17 16 18   Temp:  98.4 F (36.9 C) 98.2 F (36.8 C) 97.9 F (36.6 C)  TempSrc:  Oral Oral Oral  SpO2: 97% 100%    Weight:      Height:       General exam: Awake, laying in bed, in nad Respiratory system: Normal respiratory effort, no wheezing Cardiovascular system: regular rate, s1, s2 Gastrointestinal system: Soft, nondistended, positive BS Central nervous system: CN2-12 grossly intact, strength intact Extremities: Perfused, no clubbing Skin: Normal skin turgor, no notable skin lesions seen Psychiatry: Mood normal // no visual hallucinations   Data Reviewed:  Labs reviewed: Na 132, K 3.7, Cr 1.26, WBC 4.6, Hgb 8.5, Plts 179   Family Communication: Pt  in room, family at bedside  Disposition: Status is: Inpatient Remains inpatient appropriate because: severity of illness  Planned Discharge Destination: Home    Author: Rickey Barbara, MD 10/15/2023 2:57 PM  For on call review [email or web address].

## 2023-10-15 NOTE — Progress Notes (Signed)
   10/15/23 2355  BiPAP/CPAP/SIPAP  Reason BIPAP/CPAP not in use Non-compliant

## 2023-10-16 ENCOUNTER — Other Ambulatory Visit (HOSPITAL_COMMUNITY): Payer: Self-pay

## 2023-10-16 DIAGNOSIS — N179 Acute kidney failure, unspecified: Secondary | ICD-10-CM | POA: Diagnosis not present

## 2023-10-16 DIAGNOSIS — D649 Anemia, unspecified: Secondary | ICD-10-CM | POA: Diagnosis not present

## 2023-10-16 DIAGNOSIS — I10 Essential (primary) hypertension: Secondary | ICD-10-CM | POA: Diagnosis not present

## 2023-10-16 DIAGNOSIS — I5031 Acute diastolic (congestive) heart failure: Secondary | ICD-10-CM | POA: Diagnosis not present

## 2023-10-16 DIAGNOSIS — R001 Bradycardia, unspecified: Secondary | ICD-10-CM | POA: Diagnosis not present

## 2023-10-16 LAB — GLUCOSE, CAPILLARY
Glucose-Capillary: 111 mg/dL — ABNORMAL HIGH (ref 70–99)
Glucose-Capillary: 164 mg/dL — ABNORMAL HIGH (ref 70–99)
Glucose-Capillary: 125 mg/dL — ABNORMAL HIGH (ref 70–99)
Glucose-Capillary: 197 mg/dL — ABNORMAL HIGH (ref 70–99)

## 2023-10-16 LAB — COMPREHENSIVE METABOLIC PANEL
ALT: 48 U/L — ABNORMAL HIGH (ref 0–44)
AST: 65 U/L — ABNORMAL HIGH (ref 15–41)
Albumin: 3.2 g/dL — ABNORMAL LOW (ref 3.5–5.0)
Alkaline Phosphatase: 86 U/L (ref 38–126)
Anion gap: 7 (ref 5–15)
BUN: 21 mg/dL (ref 8–23)
CO2: 28 mmol/L (ref 22–32)
Calcium: 8.5 mg/dL — ABNORMAL LOW (ref 8.9–10.3)
Chloride: 101 mmol/L (ref 98–111)
Creatinine, Ser: 1.65 mg/dL — ABNORMAL HIGH (ref 0.61–1.24)
GFR, Estimated: 44 mL/min — ABNORMAL LOW (ref >60)
Glucose, Bld: 116 mg/dL — ABNORMAL HIGH (ref 70–99)
Potassium: 3.4 mmol/L — ABNORMAL LOW (ref 3.5–5.1)
Sodium: 136 mmol/L (ref 135–145)
Total Bilirubin: 1 mg/dL (ref <1.2)
Total Protein: 6 g/dL — ABNORMAL LOW (ref 6.5–8.1)

## 2023-10-16 LAB — CBC
HCT: 27.1 % — ABNORMAL LOW (ref 39.0–52.0)
HCT: 29.5 % — ABNORMAL LOW (ref 39.0–52.0)
Hemoglobin: 8.6 g/dL — ABNORMAL LOW (ref 13.0–17.0)
Hemoglobin: 9.5 g/dL — ABNORMAL LOW (ref 13.0–17.0)
MCH: 27.9 pg (ref 26.0–34.0)
MCH: 28.4 pg (ref 26.0–34.0)
MCHC: 31.7 g/dL (ref 30.0–36.0)
MCHC: 32.2 g/dL (ref 30.0–36.0)
MCV: 88 fL (ref 80.0–100.0)
MCV: 88.3 fL (ref 80.0–100.0)
Platelets: 176 10*3/uL (ref 150–400)
Platelets: 196 10*3/uL (ref 150–400)
RBC: 3.08 MIL/uL — ABNORMAL LOW (ref 4.22–5.81)
RBC: 3.34 MIL/uL — ABNORMAL LOW (ref 4.22–5.81)
RDW: 17.5 % — ABNORMAL HIGH (ref 11.5–15.5)
RDW: 17.3 % — ABNORMAL HIGH (ref 11.5–15.5)
WBC: 4.5 10*3/uL (ref 4.0–10.5)
WBC: 4.6 10*3/uL (ref 4.0–10.5)
nRBC: 0 % (ref 0.0–0.2)
nRBC: 0 % (ref 0.0–0.2)

## 2023-10-16 LAB — CREATININE, SERUM
Creatinine, Ser: 1.58 mg/dL — ABNORMAL HIGH (ref 0.61–1.24)
GFR, Estimated: 46 mL/min — ABNORMAL LOW (ref >60)

## 2023-10-16 LAB — PROTIME-INR
INR: 1.6 — ABNORMAL HIGH (ref 0.8–1.2)
Prothrombin Time: 19.6 s — ABNORMAL HIGH (ref 11.4–15.2)

## 2023-10-16 MED ORDER — ENOXAPARIN SODIUM 100 MG/ML IJ SOSY
100.0000 mg | PREFILLED_SYRINGE | Freq: Two times a day (BID) | INTRAMUSCULAR | Status: DC
  Administered 2023-10-16 – 2023-10-20 (×7): 100 mg via SUBCUTANEOUS
  Filled 2023-10-16 (×7): qty 1

## 2023-10-16 MED ORDER — ALPRAZOLAM 0.5 MG PO TABS
0.5000 mg | ORAL_TABLET | Freq: Every day | ORAL | Status: DC
  Administered 2023-10-16 – 2023-10-20 (×5): 0.5 mg via ORAL
  Filled 2023-10-16 (×6): qty 1

## 2023-10-16 MED ORDER — POTASSIUM CHLORIDE CRYS ER 20 MEQ PO TBCR
40.0000 meq | EXTENDED_RELEASE_TABLET | Freq: Once | ORAL | Status: AC
  Administered 2023-10-16: 40 meq via ORAL
  Filled 2023-10-16: qty 2

## 2023-10-16 MED ORDER — ENOXAPARIN SODIUM 40 MG/0.4ML IJ SOSY
40.0000 mg | PREFILLED_SYRINGE | INTRAMUSCULAR | Status: DC
  Administered 2023-10-16: 40 mg via SUBCUTANEOUS
  Filled 2023-10-16: qty 0.4

## 2023-10-16 NOTE — Plan of Care (Signed)
  Problem: Fluid Volume: Goal: Ability to maintain a balanced intake and output will improve Outcome: Progressing   Problem: Education: Goal: Knowledge of General Education information will improve Description: Including pain rating scale, medication(s)/side effects and non-pharmacologic comfort measures Outcome: Progressing   Problem: Health Behavior/Discharge Planning: Goal: Ability to manage health-related needs will improve Outcome: Progressing   Problem: Clinical Measurements: Goal: Ability to maintain clinical measurements within normal limits will improve Outcome: Progressing   Problem: Activity: Goal: Risk for activity intolerance will decrease Outcome: Progressing   Problem: Pain Management: Goal: General experience of comfort will improve Outcome: Progressing   Problem: Nutritional: Goal: Maintenance of adequate nutrition will improve Outcome: Completed/Met   Problem: Nutrition: Goal: Adequate nutrition will be maintained Outcome: Completed/Met   Problem: Coping: Goal: Level of anxiety will decrease Outcome: Completed/Met   Problem: Elimination: Goal: Will not experience complications related to urinary retention Outcome: Completed/Met

## 2023-10-16 NOTE — Plan of Care (Signed)
  Problem: Education: Goal: Ability to describe self-care measures that may prevent or decrease complications (Diabetes Survival Skills Education) will improve Outcome: Progressing Goal: Individualized Educational Video(s) Outcome: Progressing   Problem: Coping: Goal: Ability to adjust to condition or change in health will improve Outcome: Progressing   Problem: Fluid Volume: Goal: Ability to maintain a balanced intake and output will improve Outcome: Progressing   Problem: Health Behavior/Discharge Planning: Goal: Ability to identify and utilize available resources and services will improve Outcome: Progressing Goal: Ability to manage health-related needs will improve Outcome: Progressing   Problem: Metabolic: Goal: Ability to maintain appropriate glucose levels will improve Outcome: Progressing   Problem: Nutritional: Goal: Progress toward achieving an optimal weight will improve Outcome: Progressing   Problem: Skin Integrity: Goal: Risk for impaired skin integrity will decrease Outcome: Progressing   Problem: Tissue Perfusion: Goal: Adequacy of tissue perfusion will improve Outcome: Progressing   Problem: Education: Goal: Knowledge of General Education information will improve Description: Including pain rating scale, medication(s)/side effects and non-pharmacologic comfort measures Outcome: Progressing   Problem: Health Behavior/Discharge Planning: Goal: Ability to manage health-related needs will improve Outcome: Progressing   Problem: Clinical Measurements: Goal: Ability to maintain clinical measurements within normal limits will improve Outcome: Progressing Goal: Will remain free from infection Outcome: Progressing Goal: Diagnostic test results will improve Outcome: Progressing Goal: Respiratory complications will improve Outcome: Progressing Goal: Cardiovascular complication will be avoided Outcome: Progressing   Problem: Activity: Goal: Risk for  activity intolerance will decrease Outcome: Progressing   Problem: Elimination: Goal: Will not experience complications related to bowel motility Outcome: Progressing   Problem: Pain Management: Goal: General experience of comfort will improve Outcome: Progressing   Problem: Safety: Goal: Ability to remain free from injury will improve Outcome: Progressing   Problem: Skin Integrity: Goal: Risk for impaired skin integrity will decrease Outcome: Progressing

## 2023-10-16 NOTE — Progress Notes (Signed)
Orthopedic Tech Progress Note Patient Details:  Eduardo VANDERSCHAAF [birth year] [id number]  Ortho Devices Type of Ortho Device: Roland Rack boot Ortho Device/Splint Location: Bi LE Ortho Device/Splint Interventions: Application   Post Interventions Patient Tolerated: Well  Basim Bartnik E Yannick Steuber 10/16/2023, 11:20 AM

## 2023-10-16 NOTE — Progress Notes (Signed)
Transition of Care [facility]) - Inpatient Brief Assessment   Patient Details  Name: Eduardo Butler MRN: [id number] Date of Birth: 05-Jun-1951  Transition of Care [facility]) CM/SW Contact:    Ronny Bacon, RN Phone Number: 10/16/2023, 4:36 PM   Clinical Narrative: From home, no TOC needs at this time.   Transition of Care Asessment: Insurance and Status: (P) Insurance coverage has been reviewed Patient has primary care physician: (P) Yes Home environment has been reviewed: (P) Home     Social Determinants of Health Reivew: (P) SDOH reviewed no interventions necessary Readmission risk has been reviewed: (P) Yes Transition of care needs: (P) no transition of care needs at this time

## 2023-10-16 NOTE — Progress Notes (Signed)
PHARMACY - ANTICOAGULATION CONSULT NOTE  Pharmacy Consult for warfarin Indication:  history of PE, lupus anticoagulant positive  Allergies  Allergen Reactions   Morphine And Codeine     hallucinations      Patient Measurements: Height: 5\' 6"  (167.6 cm) Weight: 107.8 kg (237 lb 10.5 oz) IBW/kg (Calculated) : 63.8  Vital Signs: Temp: 98 F (36.7 C) (11/18 1124) Temp Source: Oral (11/18 1124) BP: 108/51 (11/18 1124) Pulse Rate: 45 (11/18 1124)  Labs: Recent Labs    10/14/23 0145 10/15/23 1116 10/16/23 0741 10/16/23 1102  HGB 8.3* 8.5* 8.6* 9.5*  HCT 25.9* 27.3* 27.1* 29.5*  PLT 165 179 176 196  LABPROT  --   --   --  19.6*  INR  --   --   --  1.6*  CREATININE 1.26*  --  1.65* 1.58*    Estimated Creatinine Clearance: 48.7 mL/min (A) (by C-G formula based on SCr of 1.58 mg/dL (H)).   Medical History: Past Medical History:  Diagnosis Date   Anxiety    Arthritis    CHF (congestive heart failure) (HCC)    Coronary artery disease    Diabetes (HCC)    H/O cardiac catheterization 08/25/2006   normal cath-patent grafts   H/O Doppler ultrasound 02/21/2011   lower ext.venous doppler,, no treatment except support stockings are recommended if clinically indicated   H/O echocardiogram 12/02/2010   EF 55% , no significant abnormalities   History of stress test 01/04/2013   Lexiscan Myoview ; scattered PVC's ; LV EF 54% ; Normal stress test    Hyperlipidemia    Hypertension    Obesity    Peroneal DVT (deep venous thrombosis) (HCC)    Shortness of breath     Assessment: 72yoM on warfarin for history of recurrent PE, peroneal DVT, pAF, 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  Warfarin held on admission for procedures.  Pharmacy consulted to dose therapeutic Lovenox when INR < 2.   PTA warfarin regimen = 2 mg x2 days, then 1 mg and repeat (11/12 2 mg, 11/13 1 mg, 11/14 2 mg).  INR 1.6 subtherapeutic after holding warfarin x3 days.  Hgb 9.5, pltc  196.  Goal of Therapy:  INR 2-3 Monitor platelets by anticoagulation protocol: Yes   Plan:  START Lovenox 100 mg [place] Q12h (~1 mg/kg) Continue to monitor H&H and platelets F/u intervention plans  Trixie Rude, PharmD Clinical Pharmacist 10/16/2023  12:29 PM

## 2023-10-16 NOTE — Progress Notes (Signed)
Patient Name: Eduardo Butler Date of Encounter: 10/16/2023 [facility] Cardiologist: Nanetta Batty, MD   Interval Summary  .    Feeling better.  Heaviness in his chest is improving. No lightheadedness or dizziness.  Vital Signs .    Vitals:   10/15/23 0848 10/15/23 1432 10/15/23 1920 10/16/23 0351  BP: 110/78 (!) 102/57 123/67 (!) 107/53  Pulse:   (!) 45 (!) 46  Resp: 16 18 16 16   Temp: 98.2 F (36.8 C) 97.9 F (36.6 C) 97.6 F (36.4 C) 98.4 F (36.9 C)  TempSrc: Oral Oral Oral Oral  SpO2:   100% 98%  Weight:      Height:        Intake/Output Summary (Last 24 hours) at 10/16/2023 0928 Last data filed at 10/16/2023 0811 Gross per 24 hour  Intake --  Output 4700 ml  Net -4700 ml      10/12/2023    4:43 PM 10/12/2023   11:01 AM 10/11/2023    1:41 PM  Last 3 Weights  Weight (lbs) 237 lb 10.5 oz 242 lb 242 lb  Weight (kg) 107.8 kg 109.77 kg 109.77 kg      Telemetry/ECG    Sinus bradycardia. - Personally Reviewed  Echo 10/13/23:    1. Left ventricular ejection fraction, by estimation, is 55 to 60%. The  left ventricle has normal function. The left ventricle has no regional  wall motion abnormalities. Left ventricular diastolic function could not  be evaluated.   2. Right ventricular systolic function is mildly reduced. The right  ventricular size is mildly enlarged. There is mildly elevated pulmonary  artery systolic pressure. The estimated right ventricular systolic  pressure is 41.4 mmHg.   3. The mitral valve is normal in structure. Trivial mitral valve  regurgitation. No evidence of mitral stenosis.   4. The aortic valve is tricuspid. Aortic valve regurgitation is not  visualized. Aortic valve sclerosis/calcification is present, without any  evidence of aortic stenosis.   5. The inferior vena cava is dilated in size with <50% respiratory  variability, suggesting right atrial pressure of 15 mmHg.   Physical Exam .    VS:  BP (!) 107/53  (BP Location: Right Arm)   Pulse (!) 46   Temp 98.4 F (36.9 C) (Oral)   Resp 16   Ht 5\' 6"  (1.676 m)   Wt 107.8 kg   SpO2 98%   BMI 38.36 kg/m  , BMI Body mass index is 38.36 kg/m. GENERAL:  Well appearing HEENT: Pupils equal round and reactive, fundi not visualized, oral mucosa unremarkable NECK:  +  jugular venous distention to 3 cm above clavicle sitting upright.  +HJR. LUNGS:  Clear to auscultation bilaterally HEART:  Bradycardic.  PMI not displaced or sustained,S1 and S2 within normal limits, no S3, no S4, no clicks, no rubs, II/VI systolic murmur at the LUSB ABD:  Flat, positive bowel sounds normal in frequency in pitch, no bruits, no rebound, no guarding, no midline pulsatile mass, no hepatomegaly, no splenomegaly EXT:  2 plus pulses throughout, 2+ LE edema to the thighs.  No cyanosis no clubbing SKIN:  No rashes no nodules NEURO:  Cranial nerves II through XII grossly intact, motor grossly intact throughout PSYCH:  Cognitively intact, oriented to person place and time   Assessment & Plan .     36M with with HFpEF, CAD status post CABG (LIMA to LAD, free radial to LCx, sequential vein graft to acute marginal and distal RCA), persistent  atrial fibrillation, recurrent pulmonary embolism, lupus anticoagulant on Coumadin, diabetes, hyperlipidemia, and iron deficiency anemia admitted with symptomatic bradycardia and acute on chronic heart failure.  # Acute on chronic HFpEF: Volume overloaded on admission.  He continues to diurese nicely.  He was net -1.9 L so far today and -2.8 L yesterday. He is -10.7 L since admission.  However, creatinine today is increase from 1.3 yesterday to 1.65 today today.  He is already -2 L for the day.  We discussed importance of limiting his fluid intake.  He is increased his fluid intake in the setting of using Ozempic.  We will hold off on additional dose of diuresis today and resume tomorrow.  He is certainly still volume overloaded and needs more  diuresis but will slow the rate down given his AKI.  # Symptomatic bradycardia: Initially reported reported near syncope and exertional chest tightness.  Home carvedilol was held.  He continues to be bradycardic.  He has no symptoms.  No plan for pacemaker at this time.  # CAD: # Hyperlipidemia: Patient presented with chest pain and shortness of breath that have been ongoing for 6 months.  Nuclear PET 08/2023 revealed a small fixed defects in the mid lateral wall consistent with prior infarct but no ischemia.  LVEF 45%.  Status post four-vessel CABG.   Planning for left and right heart cath once euvolemic if his hemoglobin is stable.  Continue statin and Zetia.  We discussed the fact there is no rush for this, especially in light of the fact that his biggest issue right now is volume and his renal function is tenuous.  Overall, I am reassured by his negative stress test and think most of his symptoms are due to heart failure.    # Persistent atrial fibrillation: Currently in sinus bradycardia.  Home carvedilol has been held but he remains on amiodarone.  Home warfarin is on hold.  INR has not been checked since 11/14.  Will check INR.  For now, start prophylactic Lovenox.  Once INR is below 2, start therapeutic Lovenox.  Holding with plans for cath once euvolemic as above.  # Anemia: Admitted with a hemoglobin of 8.9.  Iron/ferrtin levels are low normal.  MCV is normal.  Exertional chest tightness.  Unclear how much of it is due to obstructive coronary disease versus anemia and bradycardia in the setting of underlying coronary disease.  # Diabetes: Home Ozempic on hold.  Recommend adding SGLT2 inhibitor this admission.  # OSA: Continue CPAP   For questions or updates, please contact [facility] Please consult [email or web address] for contact info under        Signed, Chilton Si, MD

## 2023-10-16 NOTE — Progress Notes (Signed)
Progress Note   Patient: Eduardo Butler:[id number] DOB: 26-May-1951 DOA: 10/12/2023     4 DOS: the patient was seen and examined on 10/16/2023   Brief hospital course: 72 y.o. male with a PMH significant for HLD, CAD, anxiety, PE on Coumadin, HTN, lupus anticoagulant positive, PAF, OSA, type 2 diabetes mellitus, obesity. Pt presented from home to the ED on 10/12/2023 due to the guidance of their cardiologist in setting of anemia and AKI on outpatient labs. Pt also had chest pains PTA. Pt had been taking ozempic since 2/24 with GI symptoms starting about 2-3 months later. Pt reports dark brown stools, not black, red, or maroon.  In the ED, pt was found to have HR in the 40's. Stool was heme neg. Hgb of 8.3. Cardiology was co  Assessment and Plan: Bradycardia -heart rate in the 40s at presentation -Cardiology following -TSH 1.807 -cont to hold Beta blocker and amio. Per Cardiology, consider lower dose of amio prior to d/c -Asymptomatic -cont to monitor on tele   AKI - hypervolemic on exam with 3+ LE edema and pulmonary congestion with small left pleural effusion. recently started ozempic and drinking kool-aid with splenda to increase his fluid intake. Baseline Cr 10 months ago was normal at 0.9. 1.5 on presentation today and in office yesterday.  - Pt is continued on IV lasix -Cr has improved   Anemia secondary to B12 deficiency and possible iron deficiency -No obvious sign of blood loss. Stool is heme neg on admit. Pt reports dark brown stool, no melena or red stools -Iron level normal, folate normal -B12 is low at 169 -Iron sat low -Have ordered daily b12 injections, would plan for 7 days, then once monthly -continue to monitor closely for blood loss, although doubt blood loss at this point -currently off coumadin -consider iron replacement   HTN-  -Currently well controlled - holding home losartan in setting of AKI   OSA- -CPAP nightly   Type II diabetes  mellitus -Hemoglobin A1c 6.2 -Sensitive sliding scale insulin while in hospital -cont to hold home glycemic agents  ARF -Baseline Cr noted to be <1 -Presenting Cr in excess of 1.5 -Cr is trending down after holding ARB -recheck bmet in AM   CAD-history of CABG 2003.   -Continue aspirin, Zetia, rosuvastatin -Discussed with Cardiology. Tentative plan for heart cath on 11/18 once stable from a hematologic standpoint and when euvolemic, currently still volume overloaded  Acute on chronic HFpEF -Continuing to diurese per Cardiology   Anxiety- chronic and not in acute flare - continue home anxiolytics PRN at bedtime    Subjective: Without complaints today  Physical Exam: Vitals:   10/15/23 1432 10/15/23 1920 10/16/23 0351 10/16/23 1124  BP: (!) 102/57 123/67 (!) 107/53 (!) 108/51  Pulse:  (!) 45 (!) 46 (!) 45  Resp: 18 16 16 16   Temp: 97.9 F (36.6 C) 97.6 F (36.4 C) 98.4 F (36.9 C) 98 F (36.7 C)  TempSrc: Oral Oral Oral Oral  SpO2:  100% 98% 99%  Weight:      Height:       General exam: Conversant, in no acute distress Respiratory system: normal chest rise, clear, no audible wheezing Cardiovascular system: regular rhythm, s1-s2 Gastrointestinal system: Nondistended, nontender, pos BS Central nervous system: No seizures, no tremors Extremities: No cyanosis, no joint deformities Skin: No rashes, no pallor Psychiatry: Affect normal // no auditory hallucinations   Data Reviewed:  Labs reviewed: Na 136, K 3.4, Cr 1.58, WBC 4.6, Hgb  9.5, Plts 196   Family Communication: Pt in room, family at bedside  Disposition: Status is: Inpatient Remains inpatient appropriate because: severity of illness  Planned Discharge Destination: Home    Author: Rickey Barbara, MD 10/16/2023 4:55 PM  For on call review [email or web address].

## 2023-10-16 NOTE — Progress Notes (Signed)
   Heart Failure Stewardship Pharmacist Progress Note   PCP: Joycelyn Rua, MD PCP-Cardiologist: Nanetta Batty, MD    HPI:  72 yo M with PMH of CHF, CAD s/p CABG 2023, recurrent PE on warfarin, HTN, HLD, T2DM, and afib.  He presented for cardiology follow up on 11/13 when he complained of ongoing dizziness (denies syncope) and fatigue. EKG with sinus brady, rate 41. Hgb drop on labs (8.9 down from 12.2 in 10/2022). Advised to go to the ED for evaluation. Reports chronic LE edema but worse than baseline. BNP 385.4. Hgb down further to 8.5. CXR with cardiomegaly and mild pulmonary vascular congestion. ECHO 11/15 showed LVEF 55-60%, RV mildly reduced, trivial MR. Plan for [facility] once hgb stable.   Reports no shortness of breath. Still with 2+ edema in LE. States that his legs feel numb from the knee down. Up sitting in the chair. Encouraged to keep legs elevated. Currently in the donut hole but states that he can afford medications with donut hole pricing (paid >$200 x 2 for Ozempic). Was concerned that Ozempic was causing the issues he's been experiencing lately so he self-discontinued this 3 weeks ago.   Current HF Medications: None  Prior to admission HF Medications: Diuretic: furosemide 80 mg daily Beta blocker: carvedilol 6.25 mg BID ACE/ARB/ARNI: losartan 25 mg daily  Pertinent Lab Values: Serum creatinine 1.58, BUN 21, Potassium 3.4, Sodium 136, BNP 385.4, Magnesium 2.0, A1c 6.2   Vital Signs: Weight: 237 lbs on admission Blood pressure: 110/50s  Heart rate: 40s  I/O: net -2.7L yesterday; net -10.8L since admission  Medication Assistance / Insurance Benefits Check: Does the patient have prescription insurance?  Yes Type of insurance plan: [employer]  Outpatient Pharmacy:  Prior to admission outpatient pharmacy: [facility] Is the patient willing to use [facility] TOC pharmacy at discharge? Yes Is the patient willing to transition their outpatient pharmacy to  utilize a [facility] outpatient pharmacy?   No    Assessment: 1. Acute on chronic diastolic CHF (LVEF 55-60%). NYHA class II symptoms. - Volume status improved, off IV lasix. Has had good urine output (intake not well documented) but no updated weight since admission. Keep K>4 and Mg>2. - Holding BB with bradycardia - Holding losartan with AKI - planning cath once hgb stable - Consider adding MRA and SGLT2i prior to discharge - Would stop actos on discharge as this can worsen HF and increase risk of hospitalizations   Plan: 1) Medication changes recommended at this time: - Stop Actos on discharge - Add SGLT2i and spironolactone prior to discharge - Daily weights  2) Patient assistance: - Appears patient is in the coverage gap (donut hole) - Farxiga copay $139 - Jardiance copay $146  - May be able to use free 30 day trial + samples if needed to get to the new year  3)  Education  - Initial education completed - Full education to be completed prior to discharge  Sharen Hones, PharmD, BCPS Heart Failure Engineer, building services Phone [phone]

## 2023-10-17 ENCOUNTER — Telehealth: Payer: Self-pay | Admitting: *Deleted

## 2023-10-17 DIAGNOSIS — N179 Acute kidney failure, unspecified: Secondary | ICD-10-CM | POA: Diagnosis not present

## 2023-10-17 DIAGNOSIS — D649 Anemia, unspecified: Secondary | ICD-10-CM | POA: Diagnosis not present

## 2023-10-17 DIAGNOSIS — R001 Bradycardia, unspecified: Secondary | ICD-10-CM | POA: Diagnosis not present

## 2023-10-17 DIAGNOSIS — I10 Essential (primary) hypertension: Secondary | ICD-10-CM | POA: Diagnosis not present

## 2023-10-17 DIAGNOSIS — I5031 Acute diastolic (congestive) heart failure: Secondary | ICD-10-CM | POA: Diagnosis not present

## 2023-10-17 LAB — CBC
HCT: 27.1 % — ABNORMAL LOW (ref 39.0–52.0)
Hemoglobin: 8.6 g/dL — ABNORMAL LOW (ref 13.0–17.0)
MCH: 28 pg (ref 26.0–34.0)
MCHC: 31.7 g/dL (ref 30.0–36.0)
MCV: 88.3 fL (ref 80.0–100.0)
Platelets: 168 10*3/uL (ref 150–400)
RBC: 3.07 MIL/uL — ABNORMAL LOW (ref 4.22–5.81)
RDW: 17.5 % — ABNORMAL HIGH (ref 11.5–15.5)
WBC: 4.7 10*3/uL (ref 4.0–10.5)
nRBC: 0 % (ref 0.0–0.2)

## 2023-10-17 LAB — COMPREHENSIVE METABOLIC PANEL
ALT: 47 U/L — ABNORMAL HIGH (ref 0–44)
AST: 60 U/L — ABNORMAL HIGH (ref 15–41)
Albumin: 3.3 g/dL — ABNORMAL LOW (ref 3.5–5.0)
Alkaline Phosphatase: 87 U/L (ref 38–126)
Anion gap: 7 (ref 5–15)
BUN: 24 mg/dL — ABNORMAL HIGH (ref 8–23)
CO2: 29 mmol/L (ref 22–32)
Calcium: 8.5 mg/dL — ABNORMAL LOW (ref 8.9–10.3)
Chloride: 101 mmol/L (ref 98–111)
Creatinine, Ser: 1.24 mg/dL (ref 0.61–1.24)
GFR, Estimated: >60 mL/min (ref >60)
Glucose, Bld: 97 mg/dL (ref 70–99)
Potassium: 3.9 mmol/L (ref 3.5–5.1)
Sodium: 137 mmol/L (ref 135–145)
Total Bilirubin: 0.9 mg/dL (ref <1.2)
Total Protein: 6.1 g/dL — ABNORMAL LOW (ref 6.5–8.1)

## 2023-10-17 LAB — GLUCOSE, CAPILLARY
Glucose-Capillary: 92 mg/dL (ref 70–99)
Glucose-Capillary: 90 mg/dL (ref 70–99)
Glucose-Capillary: 136 mg/dL — ABNORMAL HIGH (ref 70–99)

## 2023-10-17 MED ORDER — EMPAGLIFLOZIN 10 MG PO TABS
10.0000 mg | ORAL_TABLET | Freq: Every day | ORAL | Status: DC
  Administered 2023-10-17 – 2023-10-21 (×5): 10 mg via ORAL
  Filled 2023-10-17 (×5): qty 1

## 2023-10-17 MED ORDER — FUROSEMIDE 10 MG/ML IJ SOLN
80.0000 mg | Freq: Two times a day (BID) | INTRAMUSCULAR | Status: AC
  Administered 2023-10-17 – 2023-10-20 (×8): 80 mg via INTRAVENOUS
  Filled 2023-10-17 (×8): qty 8

## 2023-10-17 MED ORDER — SODIUM CHLORIDE 0.9 % IV SOLN
100.0000 mg | Freq: Once | INTRAVENOUS | Status: AC
  Administered 2023-10-17: 100 mg via INTRAVENOUS
  Filled 2023-10-17: qty 5

## 2023-10-17 NOTE — Progress Notes (Signed)
Pt blew nose and reported nose was bleeding. Small amount of blood and a tiny clot noted on tissue paper pt saved at bedside. Pt also stated he thought his urine looked blood tinged. Urine I visualized in urinal appeared darker in color (amber) but not bloody. Pt is concerned that he was started on lovenox injections Q12h yesterday-had dose due at 0115 but pt wants to discuss with MD prior to taking any further Lovenox injections/blood thinners.

## 2023-10-17 NOTE — Progress Notes (Signed)
Patient Name: Eduardo Butler Date of Encounter: 10/17/2023 [facility] Cardiologist: Nanetta Batty, MD   Interval Summary  .    Feeling better.  Walked without difficulty.  +Shortness of breath when lying down.  Denies.  Vital Signs .    Vitals:   10/16/23 1124 10/16/23 1957 10/16/23 2052 10/17/23 0505  BP: (!) 108/51 110/61  112/61  Pulse: (!) 45 (!) 46 (!) 51 (!) 50  Resp: 16 16  18   Temp: 98 F (36.7 C) 97.8 F (36.6 C)  98.2 F (36.8 C)  TempSrc: Oral Oral  Oral  SpO2: 99% 98% 95% 90%  Weight:    104.6 kg  Height:        Intake/Output Summary (Last 24 hours) at 10/17/2023 0926 Last data filed at 10/17/2023 0726 Gross per 24 hour  Intake 240 ml  Output 300 ml  Net -60 ml      10/17/2023    5:05 AM 10/12/2023    4:43 PM 10/12/2023   11:01 AM  Last 3 Weights  Weight (lbs) 230 lb 11.2 oz 237 lb 10.5 oz 242 lb  Weight (kg) 104.645 kg 107.8 kg 109.77 kg      Telemetry/ECG    Sinus bradycardia.  Rate 40-50s - Personally Reviewed  Echo 10/13/23:    1. Left ventricular ejection fraction, by estimation, is 55 to 60%. The  left ventricle has normal function. The left ventricle has no regional  wall motion abnormalities. Left ventricular diastolic function could not  be evaluated.   2. Right ventricular systolic function is mildly reduced. The right  ventricular size is mildly enlarged. There is mildly elevated pulmonary  artery systolic pressure. The estimated right ventricular systolic  pressure is 41.4 mmHg.   3. The mitral valve is normal in structure. Trivial mitral valve  regurgitation. No evidence of mitral stenosis.   4. The aortic valve is tricuspid. Aortic valve regurgitation is not  visualized. Aortic valve sclerosis/calcification is present, without any  evidence of aortic stenosis.   5. The inferior vena cava is dilated in size with <50% respiratory  variability, suggesting right atrial pressure of 15 mmHg.   Physical Exam .    VS:   BP 112/61 (BP Location: Right Arm)   Pulse (!) 50   Temp 98.2 F (36.8 C) (Oral)   Resp 18   Ht 5\' 6"  (1.676 m)   Wt 104.6 kg   SpO2 90%   BMI 37.24 kg/m  , BMI Body mass index is 37.24 kg/m. GENERAL:  Well appearing HEENT: Pupils equal round and reactive, fundi not visualized, oral mucosa unremarkable NECK:  +  jugular venous distention to 3 cm above clavicle sitting upright.  +HJR. LUNGS:  Clear to auscultation bilaterally HEART:  Bradycardic.  PMI not displaced or sustained,S1 and S2 within normal limits, no S3, no S4, no clicks, no rubs, II/VI systolic murmur at the LUSB ABD:  Flat, positive bowel sounds normal in frequency in pitch, no bruits, no rebound, no guarding, no midline pulsatile mass, no hepatomegaly, no splenomegaly EXT:  2 plus pulses throughout, 2+ LE edema to the thighs.  No cyanosis no clubbing SKIN:  No rashes no nodules NEURO:  Cranial nerves II through XII grossly intact, motor grossly intact throughout PSYCH:  Cognitively intact, oriented to person place and time   Assessment & Plan .     27M with with HFpEF, CAD status post CABG (LIMA to LAD, free radial to LCx, sequential vein graft to  acute marginal and distal RCA), persistent atrial fibrillation, recurrent pulmonary embolism, lupus anticoagulant on Coumadin, diabetes, hyperlipidemia, and iron deficiency anemia admitted with symptomatic bradycardia and acute on chronic heart failure.  # Acute on chronic HFpEF: Volume overloaded on admission.  He continues to diurese nicely.  He was net -1.9 L yesterday.  He is -10.8 L since admission.  Creatinine has normalized.  We did hold 1 dose of diuretic yesterday due to worsening renal function.  Will resume at 80 mg of Lasix IV twice daily.  We discussed importance of limiting his fluid intake.  He is increased his fluid intake in the setting of using Ozempic. Will add SGLT-2 given that his renal function has normalized.   # Symptomatic bradycardia: Initially  reported reported near syncope and exertional chest tightness.  Home carvedilol was held.  He continues to be bradycardic.  He has no symptoms.  No plan for pacemaker at this time.  # CAD: # Hyperlipidemia: Patient presented with chest pain and shortness of breath that have been ongoing for 6 months.  Nuclear PET 08/2023 revealed a small fixed defects in the mid lateral wall consistent with prior infarct but no ischemia.  LVEF 45%.  Status post four-vessel CABG.   Planning for left and right heart cath once euvolemic if his hemoglobin is stable.  Continue statin and Zetia.  We discussed the fact there is no rush for this, especially in light of the fact that his biggest issue right now is volume and his renal function is tenuous.  Overall, I am reassured by his negative stress test and think most of his symptoms are due to heart failure.    # Persistent atrial fibrillation: Currently in sinus bradycardia.  Home carvedilol has been held but he remains on amiodarone.  Home warfarin is on hold.  INR has not been checked since 11/14.  Will check INR.  INR is now <2 so he was started on therapeutic Lovenox.  Holding with plans for cath once euvolemic as above.  # Anemia: Admitted with a hemoglobin of 8.9.  Iron/ferrtin levels are low normal.  MCV is normal.  Exertional chest tightness.  Unclear how much of it is due to obstructive coronary disease versus anemia and bradycardia in the setting of underlying coronary disease.  Consider Watchman as outpatient.   # Diabetes: Home Ozempic on hold.  Adding dding SGLT2 inhibitor as above.   # OSA: Continue CPAP   For questions or updates, please contact [facility] Please consult [email or web address] for contact info under        Signed, Chilton Si, MD

## 2023-10-17 NOTE — Plan of Care (Signed)
  Problem: Education: Goal: Ability to describe self-care measures that may prevent or decrease complications (Diabetes Survival Skills Education) will improve Outcome: Progressing Goal: Individualized Educational Video(s) Outcome: Progressing   Problem: Coping: Goal: Ability to adjust to condition or change in health will improve Outcome: Progressing   Problem: Fluid Volume: Goal: Ability to maintain a balanced intake and output will improve Outcome: Progressing   Problem: Health Behavior/Discharge Planning: Goal: Ability to identify and utilize available resources and services will improve Outcome: Progressing Goal: Ability to manage health-related needs will improve Outcome: Progressing   Problem: Metabolic: Goal: Ability to maintain appropriate glucose levels will improve Outcome: Progressing   Problem: Nutritional: Goal: Progress toward achieving an optimal weight will improve Outcome: Progressing   Problem: Skin Integrity: Goal: Risk for impaired skin integrity will decrease Outcome: Progressing   Problem: Tissue Perfusion: Goal: Adequacy of tissue perfusion will improve Outcome: Progressing   Problem: Education: Goal: Knowledge of General Education information will improve Description: Including pain rating scale, medication(s)/side effects and non-pharmacologic comfort measures Outcome: Progressing   Problem: Health Behavior/Discharge Planning: Goal: Ability to manage health-related needs will improve Outcome: Progressing   Problem: Clinical Measurements: Goal: Ability to maintain clinical measurements within normal limits will improve Outcome: Progressing Goal: Will remain free from infection Outcome: Progressing Goal: Diagnostic test results will improve Outcome: Progressing Goal: Respiratory complications will improve Outcome: Progressing Goal: Cardiovascular complication will be avoided Outcome: Progressing   Problem: Activity: Goal: Risk for  activity intolerance will decrease Outcome: Progressing   Problem: Elimination: Goal: Will not experience complications related to bowel motility Outcome: Progressing   Problem: Pain Management: Goal: General experience of comfort will improve Outcome: Progressing   Problem: Safety: Goal: Ability to remain free from injury will improve Outcome: Progressing   Problem: Skin Integrity: Goal: Risk for impaired skin integrity will decrease Outcome: Progressing

## 2023-10-17 NOTE — Telephone Encounter (Signed)
Patient would like to speak with doctor or nurse regarding a discussion that was discussed at appt

## 2023-10-17 NOTE — Progress Notes (Signed)
Progress Note   Patient: Eduardo Butler:[id number] DOB: 09-22-1951 DOA: 10/12/2023     5 DOS: the patient was seen and examined on 10/17/2023   Brief hospital course: 72 y.o. male with a PMH significant for HLD, CAD, anxiety, PE on Coumadin, HTN, lupus anticoagulant positive, PAF, OSA, type 2 diabetes mellitus, obesity. Pt presented from home to the ED on 10/12/2023 due to the guidance of their cardiologist in setting of anemia and AKI on outpatient labs. Pt also had chest pains PTA. Pt had been taking ozempic since 2/24 with GI symptoms starting about 2-3 months later. Pt reports dark brown stools, not black, red, or maroon.  In the ED, pt was found to have HR in the 40's. Stool was heme neg. Hgb of 8.3. Cardiology was co  Assessment and Plan: Bradycardia -heart rate in the 40s at presentation -Cardiology following -TSH 1.807 -cont to hold Beta blocker  -Asymptomatic -cont to monitor on tele   AKI - hypervolemic on exam with 3+ LE edema and pulmonary congestion with small left pleural effusion. recently started ozempic and drinking kool-aid with splenda to increase his fluid intake. Baseline Cr 10 months ago was normal at 0.9. 1.5 on presentation today and in office yesterday.  -Lasix resumed per Cardiology -Cr stable -recheck bmet in AM   Anemia secondary to B12 deficiency and possible iron deficiency -No obvious sign of blood loss. Stool is heme neg on admit. Pt reports dark brown stool, no melena or red stools -Iron level normal, folate normal -B12 is low at 169 -Iron sat low -Have ordered daily b12 injections, would plan for 7 days, then once monthly -continue to monitor closely for blood loss, although doubt blood loss at this point -currently off coumadin -Pt now s/p dose of IV iron -Follow cbc trends   HTN-  -Currently well controlled - holding home losartan in setting of AKI   OSA- -CPAP nightly   Type II diabetes mellitus -Hemoglobin A1c 6.2 -Sensitive sliding  scale insulin while in hospital -cont to hold home glycemic agents  ARF -Baseline Cr noted to be <1 -Presenting Cr in excess of 1.5 -Cr is trending down after holding ARB -recheck bmet in AM   CAD-history of CABG 2003.   -Continue aspirin, Zetia, rosuvastatin -Cardiology following. Possible L and R heart cath once pt is euvolemic  Acute on chronic HFpEF -Continuing to diurese per Cardiology   Anxiety- chronic and not in acute flare - continue home anxiolytics PRN at bedtime    Subjective: No complaints today  Physical Exam: Vitals:   10/16/23 1957 10/16/23 2052 10/17/23 0505 10/17/23 1205  BP: 110/61  112/61 109/61  Pulse: (!) 46 (!) 51 (!) 50 (!) 49  Resp: 16  18 18   Temp: 97.8 F (36.6 C)  98.2 F (36.8 C) 97.8 F (36.6 C)  TempSrc: Oral  Oral Oral  SpO2: 98% 95% 90% 99%  Weight:   104.6 kg   Height:       General exam: Awake, laying in bed, in nad Respiratory system: Normal respiratory effort, no wheezing Cardiovascular system: regular rate, s1, s2 Gastrointestinal system: Soft, nondistended, positive BS Central nervous system: CN2-12 grossly intact, strength intact Extremities: Perfused, no clubbing Skin: Normal skin turgor, no notable skin lesions seen Psychiatry: Mood normal // no visual hallucinations   Data Reviewed:  Labs reviewed: Na 137, K 3.9, Cr 1.24, Hgb 8.6, WBC 4.7   Family Communication: Pt in room, family at bedside  Disposition: Status is: Inpatient  Remains inpatient appropriate because: severity of illness  Planned Discharge Destination: Home    Author: Rickey Barbara, MD 10/17/2023 4:29 PM  For on call review [email or web address].

## 2023-10-17 NOTE — Plan of Care (Signed)
  Problem: Coping: Goal: Ability to adjust to condition or change in health will improve Outcome: Progressing   Problem: Metabolic: Goal: Ability to maintain appropriate glucose levels will improve Outcome: Progressing   Problem: Education: Goal: Knowledge of General Education information will improve Description: Including pain rating scale, medication(s)/side effects and non-pharmacologic comfort measures Outcome: Progressing   Problem: Health Behavior/Discharge Planning: Goal: Ability to manage health-related needs will improve Outcome: Progressing   Problem: Clinical Measurements: Goal: Ability to maintain clinical measurements within normal limits will improve Outcome: Progressing Goal: Diagnostic test results will improve Outcome: Progressing   Problem: Activity: Goal: Risk for activity intolerance will decrease Outcome: Progressing   Problem: Pain Management: Goal: General experience of comfort will improve Outcome: Progressing

## 2023-10-17 NOTE — Progress Notes (Signed)
   Heart Failure Stewardship Pharmacist Progress Note   PCP: Joycelyn Rua, MD PCP-Cardiologist: Nanetta Batty, MD    HPI:  72 yo M with PMH of CHF, CAD s/p CABG 2023, recurrent PE on warfarin, HTN, HLD, T2DM, and afib.  He presented for cardiology follow up on 11/13 when he complained of ongoing dizziness (denies syncope) and fatigue. EKG with sinus brady, rate 41. Hgb drop on labs (8.9 down from 12.2 in 10/2022). Advised to go to the ED for evaluation. Reports chronic LE edema but worse than baseline. BNP 385.4. Hgb down further to 8.5. CXR with cardiomegaly and mild pulmonary vascular congestion. ECHO 11/15 showed LVEF 55-60%, RV mildly reduced, trivial MR. Plan for [facility] once euvolemic and if hgb stable.   Reports no shortness of breath at rest, only when laying flat. Still with 2+ edema in LE. Encouraged again to keep legs elevated. Currently in the donut hole but states that he can afford medications with donut hole pricing (paid >$200 x 2 for Ozempic). Was concerned that Ozempic was causing the issues he's been experiencing lately so he self-discontinued this 3 weeks ago.   Current HF Medications: Diuretic: furosemide 80 mg IV BID  Prior to admission HF Medications: Diuretic: furosemide 80 mg daily Beta blocker: carvedilol 6.25 mg BID ACE/ARB/ARNI: losartan 25 mg daily  Pertinent Lab Values: Serum creatinine 1.24, BUN 24, Potassium 3.9, Sodium 137, BNP 385.4, Magnesium 2.0, A1c 6.2   Vital Signs: Weight: 230 lbs (237 lbs on admission) Blood pressure: 110/60s  Heart rate: 40s  I/O: net -2.9L yesterday; net -10.8L since admission  Medication Assistance / Insurance Benefits Check: Does the patient have prescription insurance?  Yes Type of insurance plan: [employer] Medicare  Outpatient Pharmacy:  Prior to admission outpatient pharmacy: [facility] Is the patient willing to use [facility] TOC pharmacy at discharge? Yes Is the patient willing to transition their outpatient  pharmacy to utilize a [facility] outpatient pharmacy?   No    Assessment: 1. Acute on chronic diastolic CHF (LVEF 55-60%). NYHA class III symptoms. - Remains fluid overloaded on exam. Has had good urine output (intake not well documented). Continue furosemide 80 mg IV BID. Daily weights. Keep K>4 and Mg>2. - Holding BB with bradycardia - Held losartan with AKI, creatinine improved - planning cath once euvolemic - Consider adding MRA prior to discharge - Creatinine improving, consider adding Jardiance 10 mg daily - Would stop actos on discharge as this can worsen HF and increase risk of hospitalizations   Plan: 1) Medication changes recommended at this time: - Add Jardiance 10 mg daily - Stop Actos on discharge - Add SGLT2i and spironolactone prior to discharge  2) Patient assistance: - Appears patient is in the coverage gap (donut hole) - Farxiga copay $139 - Jardiance copay $146  - May be able to use free 30 day trial + samples if needed to get to the new year  3)  Education  - Initial education completed - Full education to be completed prior to discharge  Sharen Hones, PharmD, BCPS Heart Failure Engineer, building services Phone [phone]

## 2023-10-17 NOTE — Telephone Encounter (Signed)
Patient identification verified by 2 forms. Marilynn Rail, RN    Called and spoke to patient  Patient states:   -Currently admitted in hospital   -had zio monitor delivered to home   -since he is currently admitted and being monitored unsure if he will need to complete monitor Informed patient:   -plan can be better determined once discharge from hospital   -will send message to Dr. Bjorn Pippin per request  Patient has no further questions at this time

## 2023-10-17 NOTE — Progress Notes (Signed)
   10/17/23 2134  BiPAP/CPAP/SIPAP  Reason BIPAP/CPAP not in use Non-compliant

## 2023-10-18 DIAGNOSIS — N179 Acute kidney failure, unspecified: Secondary | ICD-10-CM | POA: Diagnosis not present

## 2023-10-18 DIAGNOSIS — R001 Bradycardia, unspecified: Secondary | ICD-10-CM | POA: Diagnosis not present

## 2023-10-18 DIAGNOSIS — I5031 Acute diastolic (congestive) heart failure: Secondary | ICD-10-CM | POA: Diagnosis not present

## 2023-10-18 DIAGNOSIS — I209 Angina pectoris, unspecified: Secondary | ICD-10-CM | POA: Diagnosis not present

## 2023-10-18 LAB — COMPREHENSIVE METABOLIC PANEL
ALT: 46 U/L — ABNORMAL HIGH (ref 0–44)
AST: 54 U/L — ABNORMAL HIGH (ref 15–41)
Albumin: 3.3 g/dL — ABNORMAL LOW (ref 3.5–5.0)
Alkaline Phosphatase: 81 U/L (ref 38–126)
Anion gap: 9 (ref 5–15)
BUN: 24 mg/dL — ABNORMAL HIGH (ref 8–23)
CO2: 29 mmol/L (ref 22–32)
Calcium: 8.5 mg/dL — ABNORMAL LOW (ref 8.9–10.3)
Chloride: 99 mmol/L (ref 98–111)
Creatinine, Ser: 1.46 mg/dL — ABNORMAL HIGH (ref 0.61–1.24)
GFR, Estimated: 51 mL/min — ABNORMAL LOW (ref >60)
Glucose, Bld: 75 mg/dL (ref 70–99)
Potassium: 3.4 mmol/L — ABNORMAL LOW (ref 3.5–5.1)
Sodium: 137 mmol/L (ref 135–145)
Total Bilirubin: 0.9 mg/dL (ref <1.2)
Total Protein: 6.2 g/dL — ABNORMAL LOW (ref 6.5–8.1)

## 2023-10-18 LAB — CBC
HCT: 26.4 % — ABNORMAL LOW (ref 39.0–52.0)
Hemoglobin: 8.4 g/dL — ABNORMAL LOW (ref 13.0–17.0)
MCH: 27.9 pg (ref 26.0–34.0)
MCHC: 31.8 g/dL (ref 30.0–36.0)
MCV: 87.7 fL (ref 80.0–100.0)
Platelets: 173 10*3/uL (ref 150–400)
RBC: 3.01 MIL/uL — ABNORMAL LOW (ref 4.22–5.81)
RDW: 17.5 % — ABNORMAL HIGH (ref 11.5–15.5)
WBC: 4.5 10*3/uL (ref 4.0–10.5)
nRBC: 0 % (ref 0.0–0.2)

## 2023-10-18 LAB — GLUCOSE, CAPILLARY
Glucose-Capillary: 68 mg/dL — ABNORMAL LOW (ref 70–99)
Glucose-Capillary: 134 mg/dL — ABNORMAL HIGH (ref 70–99)
Glucose-Capillary: 153 mg/dL — ABNORMAL HIGH (ref 70–99)
Glucose-Capillary: 97 mg/dL (ref 70–99)

## 2023-10-18 MED ORDER — CYANOCOBALAMIN 1000 MCG/ML IJ SOLN
1000.0000 ug | Freq: Every day | INTRAMUSCULAR | Status: AC
  Administered 2023-10-19: 1000 ug via SUBCUTANEOUS
  Filled 2023-10-18: qty 1

## 2023-10-18 MED ORDER — POTASSIUM CHLORIDE CRYS ER 20 MEQ PO TBCR
40.0000 meq | EXTENDED_RELEASE_TABLET | Freq: Once | ORAL | Status: AC
  Administered 2023-10-18: 40 meq via ORAL
  Filled 2023-10-18: qty 2

## 2023-10-18 MED ORDER — EMPAGLIFLOZIN 10 MG PO TABS: 10.0000 mg | ORAL_TABLET | Freq: Every day | ORAL | Status: DC

## 2023-10-18 MED ORDER — ACETAMINOPHEN 325 MG PO TABS
650.0000 mg | ORAL_TABLET | Freq: Four times a day (QID) | ORAL | Status: DC | PRN
  Administered 2023-10-19 – 2023-10-20 (×2): 650 mg via ORAL
  Filled 2023-10-18 (×2): qty 2

## 2023-10-18 NOTE — Plan of Care (Signed)

## 2023-10-18 NOTE — Progress Notes (Signed)
Eduardo Butler  EAV:[id number] DOB: 06/01/1951 DOA: 10/12/2023 PCP: Joycelyn Rua, MD    Brief Narrative:  72 year old with a history of HLD, CAD, PE on chronic Coumadin, lupus anticoagulant, PAF, DM2, HTN, and OSA who presented to the ED 10/12/2023 under the guidance of his cardiologist due to anemia and AKI appreciated on outpatient labs.  In the ER he was found to have a hemoglobin of 8.3 with heme-negative stools.  His heart rate was noted to be in the 40s.  Goals of Care:   Code Status: Full Code   DVT prophylaxis: SCDs Start: 10/12/23 1358 Lovenox  Interim Hx: Afebrile.  Remains bradycardic.  Blood pressure modestly low at 95-101 systolic.  Oxygen saturation 98% room air.  Alert and conversant.  Pleasant.  No new complaints today.  Still has impressive swelling in his feet and legs but tells me that it is dramatically better than when he presented.  Assessment & Plan:  Acute exacerbation of chronic diastolic CHF Responding well to diuresis -EF 55-60% via TTE this admission but unable to comment on diastolic function -net negative approximately 14 L since admission  Sinus bradycardia Heart rate in the 40s at presentation -evaluated by cardiology -holding beta-blocker -TSH 1.8  Acute kidney injury Patient was hypervolemic at presentation -baseline creatinine 0.9 -creatinine 1.5 at presentation -holding steady with diuresis for now -ARB on hold  Mild hypokalemia Due to diuresis -supplement and follow -check magnesium  CAD status post CABG 2003 Continue aspirin Zetia and rosuvastatin -nuclear PET 10/24 revealed a small fixed defect in the mid lateral wall suggestive of prior infarct -did report persistent chest pain with shortness of breath on exertion for approximately 6 months at presentation -plan for left and right heart cath with timing per cardiology  Anemia with B12 deficiency No obvious sign of blood loss -stool heme-negative at presentation -B12 low at 169  HTN Blood  pressure well-controlled  DM2 A1c 6.2 -CBGs controlled  Family Communication: Spoke with spouse at bedside Disposition: From home, anticipate eventual discharge home   Objective: Blood pressure (!) 101/50, pulse (!) 46, temperature 97.8 F (36.6 C), temperature source Oral, resp. rate 16, height 5\' 6"  (1.676 m), weight 103.1 kg, SpO2 100%.  Intake/Output Summary (Last 24 hours) at 10/18/2023 0945 Last data filed at 10/17/2023 1734 Gross per 24 hour  Intake 240 ml  Output 3150 ml  Net -2910 ml   Filed Weights   10/12/23 1643 10/17/23 0505 10/18/23 0541  Weight: 107.8 kg 104.6 kg 103.1 kg    Examination: General: No acute respiratory distress Lungs: Clear to auscultation bilaterally without wheezes or crackles Cardiovascular: Regular rate and rhythm without murmur gallop or rub normal S1 and S2 Abdomen: Nontender, nondistended, soft, bowel sounds positive, no rebound, no ascites, no appreciable mass Extremities: 3+ pedal edema with Unna boots in place bilateral lower extremities  CBC: Recent Labs  Lab 10/11/23 1456 10/12/23 1106 10/16/23 1102 10/17/23 0701 10/18/23 0552  WBC 4.5   < > 4.6 4.7 4.5  NEUTROABS 3.0  --   --   --   --   HGB 8.9*   < > 9.5* 8.6* 8.4*  HCT 29.2*   < > 29.5* 27.1* 26.4*  MCV 92   < > 88.3 88.3 87.7  PLT 184   < > 196 168 173   < > = values in this interval not displayed.   Basic Metabolic Panel: Recent Labs  Lab 10/11/23 1457 10/12/23 1106 10/16/23 0741 10/16/23 1102 10/17/23 0701  10/18/23 0552  NA  --    < > 136  --  137 137  K  --    < > 3.4*  --  3.9 3.4*  CL  --    < > 101  --  101 99  CO2  --    < > 28  --  29 29  GLUCOSE  --    < > 116*  --  97 75  BUN  --    < > 21  --  24* 24*  CREATININE  --    < > 1.65* 1.58* 1.24 1.46*  CALCIUM  --    < > 8.5*  --  8.5* 8.5*  MG 2.0  --   --   --   --   --    < > = values in this interval not displayed.   GFR: Estimated Creatinine Clearance: 51.4 mL/min (A) (by C-G formula based  on SCr of 1.46 mg/dL (H)).   Scheduled Meds:  ALPRAZolam  0.5 mg Oral QHS   amiodarone  100 mg Oral Daily   aspirin EC  81 mg Oral Daily   cyanocobalamin  1,000 mcg Intramuscular Daily   empagliflozin  10 mg Oral Daily   enoxaparin (LOVENOX) injection  100 mg Subcutaneous Q12H   ezetimibe  10 mg Oral Daily   folic acid  1 mg Oral Daily   furosemide  80 mg Intravenous BID   insulin aspart  0-9 Units Subcutaneous TID WC   pantoprazole  80 mg Oral Daily   polyethylene glycol  17 g Oral Daily   rosuvastatin  20 mg Oral Daily      LOS: 6 days   Lonia Blood, MD [facility] Office  [phone] Pager - Text Page per Loretha Stapler  If 7PM-7AM, please contact night-coverage per Amion 10/18/2023, 9:45 AM

## 2023-10-18 NOTE — Progress Notes (Signed)
   Heart Failure Stewardship Pharmacist Progress Note   PCP: Joycelyn Rua, MD PCP-Cardiologist: Nanetta Batty, MD    HPI:  72 yo M with PMH of CHF, CAD s/p CABG 2023, recurrent PE on warfarin, HTN, HLD, T2DM, and afib.  He presented for cardiology follow up on 11/13 when he complained of ongoing dizziness (denies syncope) and fatigue. EKG with sinus brady, rate 41. Hgb drop on labs (8.9 down from 12.2 in 10/2022). Advised to go to the ED for evaluation. Reports chronic LE edema but worse than baseline. BNP 385.4. Hgb down further to 8.5. CXR with cardiomegaly and mild pulmonary vascular congestion. ECHO 11/15 showed LVEF 55-60%, RV mildly reduced, trivial MR. Plan for [facility] once euvolemic and if hgb stable.   Reports no shortness of breath at rest. Still with 2+ edema in LE but improving. Currently in the donut hole but states that he can afford medications with donut hole pricing (paid >$200 x 2 for Ozempic). Was concerned that Ozempic was causing the issues he's been experiencing lately so he self-discontinued this 3 weeks ago.   Plan for [facility] on Thursday or Friday this week.  Current HF Medications: Diuretic: furosemide 80 mg IV BID SGLT2i: Jardiance 10 mg daily  Prior to admission HF Medications: Diuretic: furosemide 80 mg daily Beta blocker: carvedilol 6.25 mg BID ACE/ARB/ARNI: losartan 25 mg daily  Pertinent Lab Values: Serum creatinine 1.46, BUN 24, Potassium 3.4, Sodium 137, BNP 385.4, Magnesium 2.0, A1c 6.2   Vital Signs: Weight: 227 lbs (237 lbs on admission) Blood pressure: 90-100/50s  Heart rate: 40s  I/O: net -3L yesterday; net -13.7L since admission  Medication Assistance / Insurance Benefits Check: Does the patient have prescription insurance?  Yes Type of insurance plan: [employer] Medicare  Outpatient Pharmacy:  Prior to admission [facility]: [facility] Is the patient willing to use [facility] TOC pharmacy at discharge? Yes Is the patient  willing to transition their outpatient pharmacy to utilize a [facility] outpatient pharmacy?   No    Assessment: 1. Acute on chronic diastolic CHF (LVEF 55-60%). NYHA class III symptoms. - Remains fluid overloaded on exam but improving. Creatinine slight bump (after starting Jardiance). Has had good urine output (intake not well documented). Continue furosemide 80 mg IV BID. Daily weights. Keep K>4 and Mg>2. - Holding BB with bradycardia - Held losartan with AKI, creatinine improved but BP low - planning cath once euvolemic - Consider adding MRA prior to discharge once BP improves - Continue Jardiance 10 mg daily - Would stop actos on discharge as this can worsen HF and increase risk of hospitalizations   Plan: 1) Medication changes recommended at this time: - Stop Actos on discharge  2) Patient assistance: - Appears patient is in the coverage gap (donut hole) - Farxiga copay $139 - Jardiance copay $146  - May be able to use free 30 day trial + samples if needed to get to the new year  3)  Education  - Initial education completed - Full education to be completed prior to discharge  Sharen Hones, PharmD, BCPS Heart Failure Engineer, building services Phone [phone]

## 2023-10-18 NOTE — Telephone Encounter (Signed)
We can hold off on ZIO since he has been monitored in the hospital

## 2023-10-18 NOTE — Progress Notes (Signed)
Patient Name: Eduardo Butler Date of Encounter: 10/18/2023 [facility] Cardiologist: Nanetta Batty, MD   Interval Summary  .    Feeling well.  Ambulated the halls without difficulty.  Sees improvement in his edema.  Vital Signs .    Vitals:   10/17/23 1943 10/17/23 2134 10/18/23 0541 10/18/23 0616  BP: (!) 107/51  (!) 95/52 (!) 101/50  Pulse: (!) 47 (!) 48 (!) 50 (!) 46  Resp: 18  16   Temp: 97.8 F (36.6 C)  97.8 F (36.6 C)   TempSrc: Oral  Oral   SpO2: 100% 99% 99% 100%  Weight:   103.1 kg   Height:        Intake/Output Summary (Last 24 hours) at 10/18/2023 0933 Last data filed at 10/17/2023 1734 Gross per 24 hour  Intake 240 ml  Output 3150 ml  Net -2910 ml      10/18/2023    5:41 AM 10/17/2023    5:05 AM 10/12/2023    4:43 PM  Last 3 Weights  Weight (lbs) 227 lb 4.8 oz 230 lb 11.2 oz 237 lb 10.5 oz  Weight (kg) 103.103 kg 104.645 kg 107.8 kg      Telemetry/ECG    Sinus bradycardia.  Rate 40-50s - Personally Reviewed  Echo 10/13/23:    1. Left ventricular ejection fraction, by estimation, is 55 to 60%. The  left ventricle has normal function. The left ventricle has no regional  wall motion abnormalities. Left ventricular diastolic function could not  be evaluated.   2. Right ventricular systolic function is mildly reduced. The right  ventricular size is mildly enlarged. There is mildly elevated pulmonary  artery systolic pressure. The estimated right ventricular systolic  pressure is 41.4 mmHg.   3. The mitral valve is normal in structure. Trivial mitral valve  regurgitation. No evidence of mitral stenosis.   4. The aortic valve is tricuspid. Aortic valve regurgitation is not  visualized. Aortic valve sclerosis/calcification is present, without any  evidence of aortic stenosis.   5. The inferior vena cava is dilated in size with <50% respiratory  variability, suggesting right atrial pressure of 15 mmHg.   Physical Exam .    VS:  BP  (!) 101/50   Pulse (!) 46   Temp 97.8 F (36.6 C) (Oral)   Resp 16   Ht 5\' 6"  (1.676 m)   Wt 103.1 kg   SpO2 100%   BMI 36.69 kg/m  , BMI Body mass index is 36.69 kg/m. GENERAL:  Well appearing HEENT: Pupils equal round and reactive, fundi not visualized, oral mucosa unremarkable NECK:  +  jugular venous distention to 1 cm above clavicle sitting upright.  +HJR. LUNGS:  Clear to auscultation bilaterally HEART:  Bradycardic.  PMI not displaced or sustained,S1 and S2 within normal limits, no S3, no S4, no clicks, no rubs, II/VI systolic murmur at the LUSB ABD:  Flat, positive bowel sounds normal in frequency in pitch, no bruits, no rebound, no guarding, no midline pulsatile mass, no hepatomegaly, no splenomegaly EXT:  2 plus pulses throughout, 2+ LE edema to the mid tibia bilaterally.  Legs wrapped in Unna boots.  No cyanosis no clubbing SKIN:  No rashes no nodules NEURO:  Cranial nerves II through XII grossly intact, motor grossly intact throughout PSYCH:  Cognitively intact, oriented to person place and time   Assessment & Plan .     37M with with HFpEF, CAD status post CABG (LIMA to LAD, free radial  to LCx, sequential vein graft to acute marginal and distal RCA), persistent atrial fibrillation, recurrent pulmonary embolism, lupus anticoagulant on Coumadin, diabetes, hyperlipidemia, and iron deficiency anemia admitted with symptomatic bradycardia and acute on chronic heart failure.  # Acute on chronic HFpEF: Volume overloaded on admission.  He continues to diurese nicely.  He was net -2.9 L yesterday.  He is -13.7 L since admission.  He notes that he is down 19 pounds since admission.  Renal function slightly worse today but he remains volume overloaded.  Will continue to diurese with IV Lasix today.  Reassess in the morning.  Will add SGLT2 today.    # Symptomatic bradycardia: Initially reported reported near syncope and exertional chest tightness.  Home carvedilol was held.  He  continues to be bradycardic.  He has no symptoms.  No plan for pacemaker at this time.  # CAD: # Hyperlipidemia: Patient presented with chest pain and shortness of breath that have been ongoing for 6 months.  Nuclear PET 08/2023 revealed a small fixed defects in the mid lateral wall consistent with prior infarct but no ischemia.  LVEF 45%.  On echo LVEF was 55-60%.  Status post four-vessel CABG.   plan from the meeting team was for left and right heart cath once euvolemic if his hemoglobin is stable.  Continue statin and Zetia.  We discussed the fact there is no rush for this, especially in light of the fact that his biggest issue right now is volume and his renal function is tenuous.  Overall, I am reassured by his negative stress test and think most of his symptoms are due to heart failure.  Consider left heart cath and right heart cath tomorrow or Friday pending renal function.  # Persistent atrial fibrillation: Currently in sinus bradycardia.  Home carvedilol has been held but he remains on amiodarone.  Home warfarin is on hold.  Will check INR.  INR is now <2 so he was started on therapeutic Lovenox.  Holding warfarin with plans for cath once euvolemic as above.  # Anemia: Admitted with a hemoglobin of 8.9.  Iron/ferrtin levels are low normal.  MCV is normal.  Exertional chest tightness.  Unclear how much of it is due to obstructive coronary disease versus anemia and bradycardia in the setting of underlying coronary disease.  Consider Watchman as outpatient.   # Diabetes: Home Ozempic on hold.  Adding dding SGLT2 inhibitor as above.   # OSA: Continue CPAP   For questions or updates, please contact [facility] Please consult [email or web address] for contact info under        Signed, Chilton Si, MD

## 2023-10-18 NOTE — Telephone Encounter (Signed)
Patient identification verified by 2 forms. Marilynn Rail, RN    Called and spoke to patient  Relayed provider message below  Patient states he will outreach company once discharged  Patient has no further questions at this time

## 2023-10-19 DIAGNOSIS — I5031 Acute diastolic (congestive) heart failure: Secondary | ICD-10-CM | POA: Diagnosis not present

## 2023-10-19 DIAGNOSIS — N179 Acute kidney failure, unspecified: Secondary | ICD-10-CM | POA: Diagnosis not present

## 2023-10-19 DIAGNOSIS — R001 Bradycardia, unspecified: Secondary | ICD-10-CM | POA: Diagnosis not present

## 2023-10-19 LAB — MAGNESIUM: Magnesium: 2.4 mg/dL (ref 1.7–2.4)

## 2023-10-19 LAB — BASIC METABOLIC PANEL
Anion gap: 7 (ref 5–15)
BUN: 27 mg/dL — ABNORMAL HIGH (ref 8–23)
CO2: 29 mmol/L (ref 22–32)
Calcium: 8.4 mg/dL — ABNORMAL LOW (ref 8.9–10.3)
Chloride: 100 mmol/L (ref 98–111)
Creatinine, Ser: 1.44 mg/dL — ABNORMAL HIGH (ref 0.61–1.24)
GFR, Estimated: 52 mL/min — ABNORMAL LOW (ref >60)
Glucose, Bld: 85 mg/dL (ref 70–99)
Potassium: 3.5 mmol/L (ref 3.5–5.1)
Sodium: 136 mmol/L (ref 135–145)

## 2023-10-19 LAB — CBC
HCT: 25.4 % — ABNORMAL LOW (ref 39.0–52.0)
Hemoglobin: 8.2 g/dL — ABNORMAL LOW (ref 13.0–17.0)
MCH: 28.2 pg (ref 26.0–34.0)
MCHC: 32.3 g/dL (ref 30.0–36.0)
MCV: 87.3 fL (ref 80.0–100.0)
Platelets: 167 10*3/uL (ref 150–400)
RBC: 2.91 MIL/uL — ABNORMAL LOW (ref 4.22–5.81)
RDW: 17.5 % — ABNORMAL HIGH (ref 11.5–15.5)
WBC: 4.8 10*3/uL (ref 4.0–10.5)
nRBC: 0 % (ref 0.0–0.2)

## 2023-10-19 LAB — GLUCOSE, CAPILLARY
Glucose-Capillary: 79 mg/dL (ref 70–99)
Glucose-Capillary: 89 mg/dL (ref 70–99)
Glucose-Capillary: 132 mg/dL — ABNORMAL HIGH (ref 70–99)
Glucose-Capillary: 140 mg/dL — ABNORMAL HIGH (ref 70–99)

## 2023-10-19 LAB — PROTIME-INR
INR: 1.4 — ABNORMAL HIGH (ref 0.8–1.2)
Prothrombin Time: 17.4 s — ABNORMAL HIGH (ref 11.4–15.2)

## 2023-10-19 MED ORDER — METOLAZONE 5 MG PO TABS
5.0000 mg | ORAL_TABLET | Freq: Once | ORAL | Status: AC
  Administered 2023-10-19: 5 mg via ORAL
  Filled 2023-10-19 (×2): qty 1

## 2023-10-19 MED ORDER — ASPIRIN 81 MG PO CHEW
81.0000 mg | CHEWABLE_TABLET | ORAL | Status: AC
  Administered 2023-10-20: 81 mg via ORAL
  Filled 2023-10-19: qty 1

## 2023-10-19 MED ORDER — SODIUM CHLORIDE 0.9% FLUSH
10.0000 mL | Freq: Two times a day (BID) | INTRAVENOUS | Status: DC
  Administered 2023-10-19 (×2): 10 mL via INTRAVENOUS

## 2023-10-19 NOTE — Progress Notes (Signed)
PHARMACY - ANTICOAGULATION CONSULT NOTE  Pharmacy Consult for warfarin Indication:  history of PE, lupus anticoagulant positive  Allergies  Allergen Reactions   Morphine And Codeine     hallucinations      Patient Measurements: Height: 5\' 6"  (167.6 cm) Weight: 99.5 kg (219 lb 4.8 oz) IBW/kg (Calculated) : 63.8  Vital Signs: Temp: 97.6 F (36.4 C) (11/21 0617) Temp Source: Oral (11/21 0617) BP: 115/57 (11/21 0617) Pulse Rate: 51 (11/21 0617)  Labs: Recent Labs    10/16/23 1102 10/17/23 0701 10/18/23 0552 10/19/23 0428  HGB 9.5* 8.6* 8.4* 8.2*  HCT 29.5* 27.1* 26.4* 25.4*  PLT 196 168 173 167  LABPROT 19.6*  --   --  17.4*  INR 1.6*  --   --  1.4*  CREATININE 1.58* 1.24 1.46* 1.44*    Estimated Creatinine Clearance: 51.2 mL/min (A) (by C-G formula based on SCr of 1.44 mg/dL (H)).   Medical History: Past Medical History:  Diagnosis Date   Anxiety    Arthritis    CHF (congestive heart failure) (HCC)    Coronary artery disease    Diabetes (HCC)    H/O cardiac catheterization 08/25/2006   normal cath-patent grafts   H/O Doppler ultrasound 02/21/2011   lower ext.venous doppler,, no treatment except support stockings are recommended if clinically indicated   H/O echocardiogram 12/02/2010   EF 55% , no significant abnormalities   History of stress test 01/04/2013   Lexiscan Myoview ; scattered PVC's ; LV EF 54% ; Normal stress test    Hyperlipidemia    Hypertension    Obesity    Peroneal DVT (deep venous thrombosis) (HCC)    Shortness of breath     Assessment: 72yoM on warfarin for history of recurrent PE, peroneal DVT, pAF, 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  Warfarin held on admission for procedures.  Pharmacy consulted to dose therapeutic Lovenox when INR < 2.   PTA warfarin regimen = 2 mg x2 days, then 1 mg and repeat (11/12 2 mg, 11/13 1 mg, 11/14 2 mg).  Warfarin remains on hold for cardiac cath, planned for tomorrow. INR  low today at 1.4 as expected, covering with enoxaparin.  Goal of Therapy:  INR 2-3 Monitor platelets by anticoagulation protocol: Yes   Plan:  Continue Lovenox 100 mg [place] Q12h (~1 mg/kg) Continue to monitor H&H and platelets   Fredonia Highland, PharmD, BCPS, [facility] Clinical Pharmacist [phone] Please check AMION for all [facility] Pharmacy numbers 10/19/2023

## 2023-10-19 NOTE — Progress Notes (Signed)
Eduardo Butler  UXN:[id number] DOB: 15-Jul-1951 DOA: 10/12/2023 PCP: Joycelyn Rua, MD    Brief Narrative:  72 year old with a history of HLD, CAD, PE on chronic Coumadin, lupus anticoagulant, PAF, DM2, HTN, and OSA who presented to the ED 10/12/2023 under the guidance of his cardiologist due to anemia and AKI appreciated on outpatient labs.  In the ER he was found to have a hemoglobin of 8.3 with heme-negative stools.  His heart rate was noted to be in the 40s.  Goals of Care:   Code Status: Full Code   DVT prophylaxis: SCDs Start: 10/12/23 1358 Lovenox  Interim Hx: No new events recorded overnight.  Afebrile.  Heart rate 48-55.  Blood pressure preserved.  Saturation 98% room air.  Renal function stable.  Resting company in the bedside chair.  Denies any new complaints.  Reports that he understands and is ready to proceed with the cardiac cath tomorrow.  Assessment & Plan:  Acute exacerbation of chronic diastolic CHF Responding well to diuresis - EF 55-60% via TTE this admission but unable to comment on diastolic function - net negative approximately 16L since admission -total protein 6.2 with albumin of 3.3  Filed Weights   10/17/23 0505 10/18/23 0541 10/19/23 0617  Weight: 104.6 kg 103.1 kg 99.5 kg     Sinus bradycardia Heart rate in the 40s at presentation -evaluated by cardiology -holding beta-blocker -TSH 1.8 -heart rate stable presently  Paroxysmal atrial fibrillation Home beta-blocker discontinued due to bradycardia -on warfarin at home -warfarin presently on hold for potential cardiac cath with Lovenox bridge  Acute kidney injury Patient was hypervolemic at presentation - baseline creatinine 0.9 - creatinine 1.5 at presentation - holding steady with diuresis for now - ARB on hold  Recent Labs  Lab 10/16/23 0741 10/16/23 1102 10/17/23 0701 10/18/23 0552 10/19/23 0428  CREATININE 1.65* 1.58* 1.24 1.46* 1.44*     Mild hypokalemia Due to diuresis -supplement and  follow -magnesium is normal  CAD status post CABG 2003 Continue aspirin Zetia and rosuvastatin -nuclear PET 10/24 revealed a small fixed defect in the mid lateral wall suggestive of prior infarct -did report persistent chest pain with shortness of breath on exertion for approximately 6 months at presentation -plan for left and right heart cath 11/22  Anemia with B12 deficiency No obvious sign of blood loss -stool heme-negative at presentation -B12 low at 169 with replacement ongoing  HTN Blood pressure well-controlled  DM2 A1c 6.2 -CBGs controlled  Family Communication: Spoke with patient and family member at bedside Disposition: From home, anticipate eventual discharge home   Objective: Blood pressure (!) 115/57, pulse (!) 51, temperature 97.6 F (36.4 C), temperature source Oral, resp. rate 17, height 5\' 6"  (1.676 m), weight 99.5 kg, SpO2 93%.  Intake/Output Summary (Last 24 hours) at 10/19/2023 0935 Last data filed at 10/19/2023 2542 Gross per 24 hour  Intake 560 ml  Output 3550 ml  Net -2990 ml   Filed Weights   10/17/23 0505 10/18/23 0541 10/19/23 0617  Weight: 104.6 kg 103.1 kg 99.5 kg    Examination: General: No acute respiratory distress Lungs: Clear to auscultation bilaterally without wheezes or crackles Cardiovascular: Regular rate and rhythm without murmur gallop or rub normal S1 and S2 Abdomen: Nontender, nondistended, soft, bowel sounds positive, no rebound, no ascites, no appreciable mass Extremities: 3+ pedal edema with Unna boots in place bilateral lower extremities without significant change  CBC: Recent Labs  Lab 10/17/23 0701 10/18/23 0552 10/19/23 0428  WBC 4.7 4.5  4.8  HGB 8.6* 8.4* 8.2*  HCT 27.1* 26.4* 25.4*  MCV 88.3 87.7 87.3  PLT 168 173 167   Basic Metabolic Panel: Recent Labs  Lab 10/17/23 0701 10/18/23 0552 10/19/23 0428  NA 137 137 136  K 3.9 3.4* 3.5  CL 101 99 100  CO2 29 29 29   GLUCOSE 97 75 85  BUN 24* 24* 27*   CREATININE 1.24 1.46* 1.44*  CALCIUM 8.5* 8.5* 8.4*  MG  --   --  2.4   GFR: Estimated Creatinine Clearance: 51.2 mL/min (A) (by C-G formula based on SCr of 1.44 mg/dL (H)).   Scheduled Meds:  ALPRAZolam  0.5 mg Oral QHS   amiodarone  100 mg Oral Daily   aspirin EC  81 mg Oral Daily   cyanocobalamin  1,000 mcg Subcutaneous Daily   empagliflozin  10 mg Oral Daily   enoxaparin (LOVENOX) injection  100 mg Subcutaneous Q12H   ezetimibe  10 mg Oral Daily   folic acid  1 mg Oral Daily   furosemide  80 mg Intravenous BID   insulin aspart  0-9 Units Subcutaneous TID WC   pantoprazole  80 mg Oral Daily   polyethylene glycol  17 g Oral Daily   rosuvastatin  20 mg Oral Daily      LOS: 7 days   Lonia Blood, MD [facility] Office  [phone] Pager - Text Page per Loretha Stapler  If 7PM-7AM, please contact night-coverage per Amion 10/19/2023, 9:35 AM

## 2023-10-19 NOTE — Progress Notes (Signed)
   Heart Failure Stewardship Pharmacist Progress Note   PCP: Joycelyn Rua, MD PCP-Cardiologist: Nanetta Batty, MD    HPI:  72 yo M with PMH of CHF, CAD s/p CABG 2023, recurrent PE on warfarin, HTN, HLD, T2DM, and afib.  He presented for cardiology follow up on 11/13 when he complained of ongoing dizziness (denies syncope) and fatigue. EKG with sinus brady, rate 41. Hgb drop on labs (8.9 down from 12.2 in 10/2022). Advised to go to the ED for evaluation. Reports chronic LE edema but worse than baseline. BNP 385.4. Hgb down further to 8.5. CXR with cardiomegaly and mild pulmonary vascular congestion. ECHO 11/15 showed LVEF 55-60%, RV mildly reduced, trivial MR. Plan for [facility] once euvolemic and if hgb stable.   Reports no shortness of breath at rest. Still with 2+ edema in LE but improving. Currently in the donut hole but states that he can afford medications with donut hole pricing (paid >$200 x 2 for Ozempic). Was concerned that Ozempic was causing the issues he's been experiencing lately so he self-discontinued this 3 weeks ago.   Plan for [facility] on Friday this week.  Current HF Medications: Diuretic: furosemide 80 mg IV BID SGLT2i: Jardiance 10 mg daily  Prior to admission HF Medications: Diuretic: furosemide 80 mg daily Beta blocker: carvedilol 6.25 mg BID ACE/ARB/ARNI: losartan 25 mg daily  Pertinent Lab Values: Serum creatinine 1.44, BUN 27, Potassium 3.5, Sodium 136, BNP 385.4, Magnesium 2.4, A1c 6.2   Vital Signs: Weight: 219 lbs (237 lbs on admission) Blood pressure: 100-110/50s  Heart rate: 40-50s  I/O: net -1.6L yesterday; net -16.1L since admission  Medication Assistance / Insurance Benefits Check: Does the patient have prescription insurance?  Yes Type of insurance plan: [employer]  Outpatient Pharmacy:  Prior to admission outpatient pharmacy: [facility] Is the patient willing to use [facility] TOC pharmacy at discharge? Yes Is the patient willing to  transition their outpatient pharmacy to utilize a [facility] outpatient pharmacy?   No    Assessment: 1. Acute on chronic diastolic CHF (LVEF 55-60%). NYHA class III symptoms. - Remains fluid overloaded on exam but improving. Creatinine slight bump (after starting Jardiance) but stable today. Has had good urine output (intake not well documented). Continue furosemide 80 mg IV BID, could consider dose of metolazone today to augment diuresis. Would need to be careful with renal function since cath planned for tomorrow. Daily weights. Keep K>4 and Mg>2. Consider torsemide at discharge.  - Holding BB with bradycardia - Held losartan with AKI - planning cath tomorrow - Consider adding MRA prior to discharge once BP improves - Continue Jardiance 10 mg daily - Would stop actos on discharge as this can worsen HF and increase risk of hospitalizations   Plan: 1) Medication changes recommended at this time: - Continue IV lasix +/- addition of metolazone pending cath tomorrow - Consider torsemide at discharge - Stop Actos on discharge  2) Patient assistance: - Appears patient is in the coverage gap (donut hole) - Farxiga copay $139 - Jardiance copay $146  - May be able to use free 30 day trial + samples if needed to get to the new year  3)  Education  - Initial education completed - Full education to be completed prior to discharge  Sharen Hones, PharmD, BCPS Heart Failure Engineer, building services Phone [phone]

## 2023-10-19 NOTE — Progress Notes (Signed)
Patient Name: Eduardo Butler Date of Encounter: 10/19/2023 [facility] Cardiologist: Nanetta Batty, MD   Interval Summary  .    Feeling well.  Eager to go home but understands that he needs to stay.  Notes that he has been unable to lay flat at home due to orthopnea/PND for at least 6 months.   Vital Signs .    Vitals:   10/18/23 0616 10/18/23 1109 10/18/23 1956 10/19/23 0617  BP: (!) 101/50 (!) 128/59 114/63 (!) 115/57  Pulse: (!) 46 (!) 50 (!) 50 (!) 51  Resp:  18 20 17   Temp:  98.2 F (36.8 C) 97.9 F (36.6 C) 97.6 F (36.4 C)  TempSrc:  Oral Oral Oral  SpO2: 100% 100% 99% 93%  Weight:    99.5 kg  Height:        Intake/Output Summary (Last 24 hours) at 10/19/2023 0906 Last data filed at 10/19/2023 6045 Gross per 24 hour  Intake 560 ml  Output 3550 ml  Net -2990 ml      10/19/2023    6:17 AM 10/18/2023    5:41 AM 10/17/2023    5:05 AM  Last 3 Weights  Weight (lbs) 219 lb 4.8 oz 227 lb 4.8 oz 230 lb 11.2 oz  Weight (kg) 99.474 kg 103.103 kg 104.645 kg      Telemetry/ECG    Sinus bradycardia.  Rate 40-50s - Personally Reviewed  Echo 10/13/23:    1. Left ventricular ejection fraction, by estimation, is 55 to 60%. The  left ventricle has normal function. The left ventricle has no regional  wall motion abnormalities. Left ventricular diastolic function could not  be evaluated.   2. Right ventricular systolic function is mildly reduced. The right  ventricular size is mildly enlarged. There is mildly elevated pulmonary  artery systolic pressure. The estimated right ventricular systolic  pressure is 41.4 mmHg.   3. The mitral valve is normal in structure. Trivial mitral valve  regurgitation. No evidence of mitral stenosis.   4. The aortic valve is tricuspid. Aortic valve regurgitation is not  visualized. Aortic valve sclerosis/calcification is present, without any  evidence of aortic stenosis.   5. The inferior vena cava is dilated in size with  <50% respiratory  variability, suggesting right atrial pressure of 15 mmHg.   Physical Exam .    VS:  BP (!) 115/57 (BP Location: Right Arm)   Pulse (!) 51   Temp 97.6 F (36.4 C) (Oral)   Resp 17   Ht 5\' 6"  (1.676 m)   Wt 99.5 kg   SpO2 93%   BMI 35.40 kg/m  , BMI Body mass index is 35.4 kg/m. GENERAL:  Well appearing HEENT: Pupils equal round and reactive, fundi not visualized, oral mucosa unremarkable NECK:  +  jugular venous distention to 1 cm above clavicle sitting upright.  +HJR. LUNGS:  Clear to auscultation bilaterally HEART:  Bradycardic.  PMI not displaced or sustained,S1 and S2 within normal limits, no S3, no S4, no clicks, no rubs, II/VI systolic murmur at the LUSB ABD:  Flat, positive bowel sounds normal in frequency in pitch, no bruits, no rebound, no guarding, no midline pulsatile mass, no hepatomegaly, no splenomegaly EXT:  2 plus pulses throughout, 2+ LE edema to the mid tibia bilaterally.  Legs wrapped in Unna boots.  No cyanosis no clubbing SKIN:  No rashes no nodules NEURO:  Cranial nerves II through XII grossly intact, motor grossly intact throughout PSYCH:  Cognitively intact, oriented  to person place and time   Assessment & Plan .     7M with with HFpEF, CAD status post CABG (LIMA to LAD, free radial to LCx, sequential vein graft to acute marginal and distal RCA), persistent atrial fibrillation, recurrent pulmonary embolism, lupus anticoagulant on Coumadin, diabetes, hyperlipidemia, and iron deficiency anemia admitted with symptomatic bradycardia and acute on chronic heart failure.  # Acute on chronic HFpEF: Volume overloaded on admission.  He continues to diurese nicely.  He was net -2.9 L yesterday.  He is -16.7 L since admission and remains significantly volume overloaded.  Renal function stable. Will continue to diurese with IV Lasix today.  Reassess in the morning.  Added Jardiance 11/20.  Will add MRA post-cath.    # Symptomatic bradycardia: Initially  reported reported near syncope and exertional chest tightness.  Home carvedilol was held.  He continues to be bradycardic.  He has no symptoms.  No plan for pacemaker at this time.  # CAD: # Hyperlipidemia: Patient presented with chest pain and shortness of breath that have been ongoing for 6 months.  Nuclear PET 08/2023 revealed a small fixed defects in the mid lateral wall consistent with prior infarct but no ischemia.  LVEF 45%.  On echo LVEF was 55-60%.  Status post four-vessel CABG.   Plan from the admitting team was for left and right heart cath once euvolemic if his hemoglobin is stable.  Patient feels that he needs a new stent.  Continue statin and Zetia.  He is eager to home.  We will plan for [facility] tomorrow.  Decide on oral diuretics (would use bumetanide or torsemide) post cath to see if he can continue diuresing at home.  Informed Consent   Shared Decision Making/Informed Consent The risks [stroke (1 in 1000), death (1 in 1000), kidney failure [usually temporary] (1 in 500), bleeding (1 in 200), allergic reaction [possibly serious] (1 in 200)], benefits (diagnostic support and management of coronary artery disease) and alternatives of a cardiac catheterization were discussed in detail with Mr. Balsbaugh and he is willing to proceed.    # Persistent atrial fibrillation: Currently in sinus bradycardia.  Home carvedilol has been held but he remains on amiodarone.  Home warfarin is on hold.  Will check INR.  INR is now <2 so he was started on therapeutic Lovenox.  Holding warfarin with plans for cath tomorrow.  Resume warfarin post-cath.  # Anemia: Admitted with a hemoglobin of 8.9.  Iron/ferrtin levels are low normal.  MCV is normal.  Exertional chest tightness.  Unclear how much of it is due to obstructive coronary disease versus anemia and bradycardia in the setting of underlying coronary disease.  Consider Watchman as outpatient.   # Diabetes: Home Ozempic on hold.  Adding dding SGLT2  inhibitor as above.   # OSA: Continue CPAP   For questions or updates, please contact [facility] Please consult [email or web address] for contact info under        Signed, Chilton Si, MD

## 2023-10-20 ENCOUNTER — Encounter (HOSPITAL_COMMUNITY): Payer: Self-pay | Admitting: Student in an Organized Health Care Education/Training Program

## 2023-10-20 ENCOUNTER — Encounter (HOSPITAL_COMMUNITY)
Admission: EM | Disposition: A | Discharge status: Home / Self Care | Payer: Self-pay | Source: Home / Self Care | Attending: Internal Medicine

## 2023-10-20 DIAGNOSIS — I2510 Atherosclerotic heart disease of native coronary artery without angina pectoris: Secondary | ICD-10-CM | POA: Diagnosis not present

## 2023-10-20 DIAGNOSIS — I5031 Acute diastolic (congestive) heart failure: Secondary | ICD-10-CM | POA: Diagnosis not present

## 2023-10-20 DIAGNOSIS — I209 Angina pectoris, unspecified: Secondary | ICD-10-CM | POA: Diagnosis not present

## 2023-10-20 DIAGNOSIS — R001 Bradycardia, unspecified: Secondary | ICD-10-CM | POA: Diagnosis not present

## 2023-10-20 DIAGNOSIS — R0609 Other forms of dyspnea: Secondary | ICD-10-CM | POA: Diagnosis not present

## 2023-10-20 HISTORY — PX: RIGHT/LEFT HEART CATH AND CORONARY/GRAFT ANGIOGRAPHY: CATH118267

## 2023-10-20 LAB — BASIC METABOLIC PANEL
Anion gap: 12 (ref 5–15)
BUN: 26 mg/dL — ABNORMAL HIGH (ref 8–23)
CO2: 29 mmol/L (ref 22–32)
Calcium: 8.9 mg/dL (ref 8.9–10.3)
Chloride: 94 mmol/L — ABNORMAL LOW (ref 98–111)
Creatinine, Ser: 1.35 mg/dL — ABNORMAL HIGH (ref 0.61–1.24)
GFR, Estimated: 56 mL/min — ABNORMAL LOW (ref >60)
Glucose, Bld: 87 mg/dL (ref 70–99)
Potassium: 3.3 mmol/L — ABNORMAL LOW (ref 3.5–5.1)
Sodium: 135 mmol/L (ref 135–145)

## 2023-10-20 LAB — PROTIME-INR
INR: 1.3 — ABNORMAL HIGH (ref 0.8–1.2)
Prothrombin Time: 16.5 s — ABNORMAL HIGH (ref 11.4–15.2)

## 2023-10-20 LAB — CBC
HCT: 28.1 % — ABNORMAL LOW (ref 39.0–52.0)
Hemoglobin: 9.2 g/dL — ABNORMAL LOW (ref 13.0–17.0)
MCH: 28.6 pg (ref 26.0–34.0)
MCHC: 32.7 g/dL (ref 30.0–36.0)
MCV: 87.3 fL (ref 80.0–100.0)
Platelets: 186 10*3/uL (ref 150–400)
RBC: 3.22 MIL/uL — ABNORMAL LOW (ref 4.22–5.81)
RDW: 17.4 % — ABNORMAL HIGH (ref 11.5–15.5)
WBC: 4.9 10*3/uL (ref 4.0–10.5)
nRBC: 0 % (ref 0.0–0.2)

## 2023-10-20 LAB — POCT I-STAT EG7
Acid-Base Excess: 7 mmol/L — ABNORMAL HIGH (ref 0.0–2.0)
Acid-Base Excess: 7 mmol/L — ABNORMAL HIGH (ref 0.0–2.0)
Bicarbonate: 31.5 mmol/L — ABNORMAL HIGH (ref 20.0–28.0)
Bicarbonate: 32.6 mmol/L — ABNORMAL HIGH (ref 20.0–28.0)
Calcium, Ion: 1.15 mmol/L (ref 1.15–1.40)
Calcium, Ion: 1.16 mmol/L (ref 1.15–1.40)
HCT: 28 % — ABNORMAL LOW (ref 39.0–52.0)
HCT: 28 % — ABNORMAL LOW (ref 39.0–52.0)
Hemoglobin: 9.5 g/dL — ABNORMAL LOW (ref 13.0–17.0)
Hemoglobin: 9.5 g/dL — ABNORMAL LOW (ref 13.0–17.0)
O2 Saturation: 72 %
O2 Saturation: 72 %
Potassium: 3.3 mmol/L — ABNORMAL LOW (ref 3.5–5.1)
Potassium: 3.3 mmol/L — ABNORMAL LOW (ref 3.5–5.1)
Sodium: 137 mmol/L (ref 135–145)
Sodium: 137 mmol/L (ref 135–145)
TCO2: 33 mmol/L — ABNORMAL HIGH (ref 22–32)
TCO2: 34 mmol/L — ABNORMAL HIGH (ref 22–32)
pCO2, Ven: 46.2 mm[Hg] (ref 44–60)
pCO2, Ven: 47.9 mm[Hg] (ref 44–60)
pH, Ven: 7.44 — ABNORMAL HIGH (ref 7.25–7.43)
pH, Ven: 7.442 — ABNORMAL HIGH (ref 7.25–7.43)
pO2, Ven: 37 mm[Hg] (ref 32–45)
pO2, Ven: 37 mm[Hg] (ref 32–45)

## 2023-10-20 LAB — POCT I-STAT 7, (LYTES, BLD GAS, ICA,H+H)
Acid-Base Excess: 7 mmol/L — ABNORMAL HIGH (ref 0.0–2.0)
Bicarbonate: 31.6 mmol/L — ABNORMAL HIGH (ref 20.0–28.0)
Calcium, Ion: 1.16 mmol/L (ref 1.15–1.40)
HCT: 28 % — ABNORMAL LOW (ref 39.0–52.0)
Hemoglobin: 9.5 g/dL — ABNORMAL LOW (ref 13.0–17.0)
O2 Saturation: 95 %
Potassium: 3.4 mmol/L — ABNORMAL LOW (ref 3.5–5.1)
Sodium: 137 mmol/L (ref 135–145)
TCO2: 33 mmol/L — ABNORMAL HIGH (ref 22–32)
pCO2 arterial: 43.8 mm[Hg] (ref 32–48)
pH, Arterial: 7.466 — ABNORMAL HIGH (ref 7.35–7.45)
pO2, Arterial: 70 mm[Hg] — ABNORMAL LOW (ref 83–108)

## 2023-10-20 LAB — GLUCOSE, CAPILLARY
Glucose-Capillary: 82 mg/dL (ref 70–99)
Glucose-Capillary: 81 mg/dL (ref 70–99)
Glucose-Capillary: 167 mg/dL — ABNORMAL HIGH (ref 70–99)

## 2023-10-20 SURGERY — RIGHT/LEFT HEART CATH AND CORONARY/GRAFT ANGIOGRAPHY
Anesthesia: LOCAL

## 2023-10-20 MED ORDER — POTASSIUM CHLORIDE CRYS ER 20 MEQ PO TBCR
40.0000 meq | EXTENDED_RELEASE_TABLET | Freq: Two times a day (BID) | ORAL | Status: AC
Start: 2023-10-20 — End: 2023-10-20
  Administered 2023-10-20 (×2): 40 meq via ORAL
  Filled 2023-10-20 (×2): qty 2

## 2023-10-20 MED ORDER — LIDOCAINE HCL (PF) 1 % IJ SOLN
INTRAMUSCULAR | Status: DC | PRN
  Administered 2023-10-20: 10 mL via INTRADERMAL
  Administered 2023-10-20: 5 mL via INTRADERMAL

## 2023-10-20 MED ORDER — TORSEMIDE 20 MG PO TABS
40.0000 mg | ORAL_TABLET | Freq: Two times a day (BID) | ORAL | Status: DC
  Administered 2023-10-21: 40 mg via ORAL
  Filled 2023-10-20: qty 2

## 2023-10-20 MED ORDER — HEPARIN SODIUM (PORCINE) 1000 UNIT/ML IJ SOLN
INTRAMUSCULAR | Status: AC
  Filled 2023-10-20: qty 10

## 2023-10-20 MED ORDER — MIDAZOLAM HCL 2 MG/2ML IJ SOLN
INTRAMUSCULAR | Status: AC
  Filled 2023-10-20: qty 2

## 2023-10-20 MED ORDER — MIDAZOLAM HCL 2 MG/2ML IJ SOLN
INTRAMUSCULAR | Status: DC | PRN
  Administered 2023-10-20: 2 mg via INTRAVENOUS

## 2023-10-20 MED ORDER — FENTANYL CITRATE (PF) 100 MCG/2ML IJ SOLN
INTRAMUSCULAR | Status: AC
  Filled 2023-10-20: qty 2

## 2023-10-20 MED ORDER — WARFARIN - PHARMACIST DOSING INPATIENT: Freq: Every day | Status: DC

## 2023-10-20 MED ORDER — SODIUM CHLORIDE 0.9% FLUSH
3.0000 mL | Freq: Two times a day (BID) | INTRAVENOUS | Status: DC
  Administered 2023-10-20 – 2023-10-21 (×3): 3 mL via INTRAVENOUS

## 2023-10-20 MED ORDER — ENOXAPARIN SODIUM 100 MG/ML IJ SOSY
100.0000 mg | PREFILLED_SYRINGE | Freq: Two times a day (BID) | INTRAMUSCULAR | Status: DC
  Administered 2023-10-20 – 2023-10-21 (×2): 100 mg via SUBCUTANEOUS
  Filled 2023-10-20 (×2): qty 1

## 2023-10-20 MED ORDER — HEPARIN (PORCINE) IN NACL 1000-0.9 UT/500ML-% IV SOLN
INTRAVENOUS | Status: DC | PRN
  Administered 2023-10-20 (×2): 500 mL

## 2023-10-20 MED ORDER — FENTANYL CITRATE (PF) 100 MCG/2ML IJ SOLN
INTRAMUSCULAR | Status: DC | PRN
  Administered 2023-10-20: 25 ug via INTRAVENOUS

## 2023-10-20 MED ORDER — WARFARIN SODIUM 2 MG PO TABS
3.0000 mg | ORAL_TABLET | Freq: Once | ORAL | Status: AC
  Administered 2023-10-20: 3 mg via ORAL
  Filled 2023-10-20: qty 1

## 2023-10-20 MED ORDER — LIDOCAINE HCL (PF) 1 % IJ SOLN
INTRAMUSCULAR | Status: AC
  Filled 2023-10-20: qty 30

## 2023-10-20 MED ORDER — IOHEXOL 350 MG/ML SOLN
INTRAVENOUS | Status: DC | PRN
  Administered 2023-10-20: 38 mL

## 2023-10-20 MED ORDER — METOLAZONE 5 MG PO TABS
5.0000 mg | ORAL_TABLET | Freq: Once | ORAL | Status: AC
  Administered 2023-10-20: 5 mg via ORAL
  Filled 2023-10-20: qty 1

## 2023-10-20 SURGICAL SUPPLY — 10 items
CATH BALLN WEDGE 5F 110CM (CATHETERS) IMPLANT
CATH INFINITI 5 FR IM (CATHETERS) IMPLANT
CATH INFINITI 5FR MULTPACK ANG (CATHETERS) IMPLANT
KIT MICROPUNCTURE NIT STIFF (SHEATH) IMPLANT
PACK CARDIAC CATHETERIZATION (CUSTOM PROCEDURE TRAY) ×1 IMPLANT
SET ATX-X65L (MISCELLANEOUS) IMPLANT
SHEATH GLIDE SLENDER 4/5FR (SHEATH) IMPLANT
SHEATH PINNACLE 5F 10CM (SHEATH) IMPLANT
SHEATH PROBE COVER 6X72 (BAG) IMPLANT
WIRE EMERALD 3MM-J .035X150CM (WIRE) IMPLANT

## 2023-10-20 NOTE — Progress Notes (Signed)
Patient Name: Eduardo Butler Date of Encounter: 10/20/2023 [facility] Cardiologist: Nanetta Batty, MD   Interval Summary  .    Feeling well.  States that he can lay flat for cath.  Breathing much better.   Vital Signs .    Vitals:   10/19/23 1437 10/19/23 1919 10/19/23 1920 10/20/23 0513  BP: (!) 105/53  (!) 103/52 110/61  Pulse:   (!) 52 (!) 51  Resp:   20 17  Temp: 97.6 F (36.4 C)  98.9 F (37.2 C) 97.8 F (36.6 C)  TempSrc: Oral Oral Oral Oral  SpO2:   94% 94%  Weight:    95.5 kg  Height:        Intake/Output Summary (Last 24 hours) at 10/20/2023 4782 Last data filed at 10/20/2023 0600 Gross per 24 hour  Intake 1000 ml  Output 6275 ml  Net -5275 ml      10/20/2023    5:13 AM 10/19/2023    6:17 AM 10/18/2023    5:41 AM  Last 3 Weights  Weight (lbs) 210 lb 9.6 oz 219 lb 4.8 oz 227 lb 4.8 oz  Weight (kg) 95.528 kg 99.474 kg 103.103 kg      Telemetry/ECG    Sinus bradycardia.  Rate 40-50s - Personally Reviewed  Echo 10/13/23:    1. Left ventricular ejection fraction, by estimation, is 55 to 60%. The  left ventricle has normal function. The left ventricle has no regional  wall motion abnormalities. Left ventricular diastolic function could not  be evaluated.   2. Right ventricular systolic function is mildly reduced. The right  ventricular size is mildly enlarged. There is mildly elevated pulmonary  artery systolic pressure. The estimated right ventricular systolic  pressure is 41.4 mmHg.   3. The mitral valve is normal in structure. Trivial mitral valve  regurgitation. No evidence of mitral stenosis.   4. The aortic valve is tricuspid. Aortic valve regurgitation is not  visualized. Aortic valve sclerosis/calcification is present, without any  evidence of aortic stenosis.   5. The inferior vena cava is dilated in size with <50% respiratory  variability, suggesting right atrial pressure of 15 mmHg.   Physical Exam .    VS:  BP 110/61 (BP  Location: Right Arm)   Pulse (!) 51   Temp 97.8 F (36.6 C) (Oral)   Resp 17   Ht 5\' 6"  (1.676 m)   Wt 95.5 kg   SpO2 94%   BMI 33.99 kg/m  , BMI Body mass index is 33.99 kg/m. GENERAL:  Well appearing HEENT: Pupils equal round and reactive, fundi not visualized, oral mucosa unremarkable NECK:  +  jugular venous distention to 1 cm above clavicle sitting upright.  +HJR. LUNGS:  Clear to auscultation bilaterally HEART:  Bradycardic.  PMI not displaced or sustained,S1 and S2 within normal limits, no S3, no S4, no clicks, no rubs, II/VI systolic murmur at the LUSB ABD:  Flat, positive bowel sounds normal in frequency in pitch, no bruits, no rebound, no guarding, no midline pulsatile mass, no hepatomegaly, no splenomegaly EXT:  2 plus pulses throughout, 2+ LE edema to the lower tibia bilaterally.  Legs wrapped in Unna boots.  No cyanosis no clubbing SKIN:  No rashes no nodules NEURO:  Cranial nerves II through XII grossly intact, motor grossly intact throughout PSYCH:  Cognitively intact, oriented to person place and time   Assessment & Plan .     7M with with HFpEF, CAD status post CABG (  LIMA to LAD, free radial to LCx, sequential vein graft to acute marginal and distal RCA), persistent atrial fibrillation, recurrent pulmonary embolism, lupus anticoagulant on Coumadin, diabetes, hyperlipidemia, and iron deficiency anemia admitted with symptomatic bradycardia and acute on chronic heart failure.  # Acute on chronic HFpEF: Volume overloaded on admission.  He continues to diurese nicely.  He was net -5.2L yesterday.  He is -21.9 L since admission and remains significantly volume overloaded.  Renal function stable. He received a dose of metolazone yesterday and renal function improved.  We will supplement his potassium.  Give metolazone again today before his evening dose.  Hopefully we can transition to oral diuretics tomorrow.  Added Jardiance 11/20.  Will add MRA post-cath.    # Symptomatic  bradycardia: Initially reported reported near syncope and exertional chest tightness.  Home carvedilol was held.  He continues to be bradycardic.  He has no symptoms.  No plan for pacemaker at this time.  # CAD: # Hyperlipidemia: Patient presented with chest pain and shortness of breath that have been ongoing for 6 months.  Nuclear PET 08/2023 revealed a small fixed defects in the mid lateral wall consistent with prior infarct but no ischemia.  LVEF 45%.  On echo LVEF was 55-60%.  Status post four-vessel CABG.   Plan from the admitting team was for left and right heart cath once euvolemic if his hemoglobin is stable.  Patient feels that he needs a new stent.  Continue statin and Zetia.  He is eager to home.  We will plan for LHC/RHC today.  Decide on oral diuretics (would use bumetanide or torsemide) post cath to see if he can continue diuresing at home.  Informed Consent   Shared Decision Making/Informed Consent The risks [stroke (1 in 1000), death (1 in 1000), kidney failure [usually temporary] (1 in 500), bleeding (1 in 200), allergic reaction [possibly serious] (1 in 200)], benefits (diagnostic support and management of coronary artery disease) and alternatives of a cardiac catheterization were discussed in detail with Mr. Musson and he is willing to proceed.    # Persistent atrial fibrillation: Currently in sinus bradycardia.  Home carvedilol has been held but he remains on amiodarone.  Home warfarin is on hold.  Will check INR.  INR is now <2 so he was started on therapeutic Lovenox.  Holding warfarin with plans for cath tomorrow.  Resume warfarin post-cath.  # Anemia: Thought to be due to B12 deficiency.  Admitted with a hemoglobin of 8.9.  Iron/ferrtin levels are low normal.  MCV is normal.  Exertional chest tightness.  Unclear how much of it is due to obstructive coronary disease versus anemia and bradycardia in the setting of underlying coronary disease.    # Diabetes: Home Ozempic on  hold.  Adding dding SGLT2 inhibitor as above.   # OSA: Continue CPAP   For questions or updates, please contact [facility] Please consult [email or web address] for contact info under        Signed, Chilton Si, MD

## 2023-10-20 NOTE — Interval H&P Note (Signed)
History and Physical Interval Note:  10/20/2023 9:54 AM  [name]  has presented today for surgery, with the diagnosis of chf.  The various methods of treatment have been discussed with the patient and family. After consideration of risks, benefits and other options for treatment, the patient has consented to  Procedure(s): RIGHT/LEFT HEART CATH AND CORONARY ANGIOGRAPHY (N/A) and possible coronary angioplasty as a surgical intervention.  The patient's history has been reviewed, patient examined, no change in status, stable for surgery.  I have reviewed the patient's chart and labs.  Questions were answered to the patient's satisfaction.    I have personally met the patient, discussed with him regarding cardiac catheterization, I have reviewed his labs, his history and physical examination and also his prior cardiac evaluation.  Patient is agreeable to proceed.  Will use right femoral arterial access as he has left radial graft that was used for CABG.  Yates Decamp

## 2023-10-20 NOTE — Progress Notes (Signed)
Heart Failure Nurse Navigator Progress Note  PCP: Joycelyn Rua, MD PCP-Cardiologist: Bjorn Pippin Admission Diagnosis: stomatic anemia, Bradycardia, AKI Admitted from: Cardiology office  Presentation:   Eduardo Butler presented from his cardiologist office with further drop in his Hgb, chest discomfort and shortness of breath x 3 months. BP 126/67, HR 42, BNP 385.4, Hgb 8.5, EKG with sinus brady, CXR showed cardiomegaly and mild pulmonary vascular congestion. R/L heart cath 11/22 found mild pulmonary HTN with mildly elevated EDP.   Patient, wife, and daughter were educated on the sign and symptoms of heart failure, daily weights, when to call his doctor or go to the ED, diet/ fluid restrictions, taking all medications as prescribed and attending all medical appointments. Patient verbalized his understanding of education, a HF TOC appointment was scheduled for 11/06/2023 @ 10 am.   ECHO/ LVEF: 55-60%  Clinical Course:  Past Medical History:  Diagnosis Date   Anxiety    Arthritis    CHF (congestive heart failure) (HCC)    Coronary artery disease    Diabetes (HCC)    H/O cardiac catheterization 08/25/2006   normal cath-patent grafts   H/O Doppler ultrasound 02/21/2011   lower ext.venous doppler,, no treatment except support stockings are recommended if clinically indicated   H/O echocardiogram 12/02/2010   EF 55% , no significant abnormalities   History of stress test 01/04/2013   Lexiscan Myoview ; scattered PVC's ; LV EF 54% ; Normal stress test    Hx of CABG 2003 (LIMA to LAD, free radial to LCx, sequential vein graft to acute marginal and distal RCA, 10/13/2023   Hyperlipidemia    Hypertension    Obesity    Peroneal DVT (deep venous thrombosis) (HCC)    Shortness of breath      Social History   Socioeconomic History   Marital status: Married    Spouse name: Not on file   Number of children: Not on file   Years of education: Not on file   Highest education level: Not on file   Occupational History   Not on file  Tobacco Use   Smoking status: Never   Smokeless tobacco: Never  Substance and Sexual Activity   Alcohol use: No    Comment: none since 1979   Drug use: No   Sexual activity: Not on file  Other Topics Concern   Not on file  Social History Narrative   Not on file   Social Determinants of Health   Financial Resource Strain: Low Risk  (04/07/2022)   Received from [facility], [facility]   Overall Financial Resource Strain (CARDIA)    Difficulty of Paying Living Expenses: Not very hard  Food Insecurity: No Food Insecurity (10/12/2023)   Hunger Vital Sign    Worried About Running Out of Food in the Last Year: Never true    Ran Out of Food in the Last Year: Never true  Transportation Needs: No Transportation Needs (10/12/2023)   PRAPARE - Administrator, Civil Service (Medical): No    Lack of Transportation (Non-Medical): No  Physical Activity: Unknown (04/11/2023)   Received from [facility], [facility]   Exercise Vital Sign    Days of Exercise per Week: Patient declined    Minutes of Exercise per Session: Not on file  Stress: Patient Declined (04/11/2023)   Received from [facility], [facility] - Occupational Stress Questionnaire    Feeling of Stress : Patient declined  Social Connections:  Unknown (03/31/2022)   Received from [facility], [facility]   Social Network    Social Network: Not on file   Education Assessment and Provision:  Detailed education and instructions provided on heart failure disease management including the following:  Signs and symptoms of Heart Failure When to call the physician Importance of daily weights Low sodium diet Fluid restriction Medication management Anticipated future follow-up appointments  Patient education given on each of the above topics.  Patient acknowledges understanding via teach back method and acceptance of all  instructions.  Education Materials:  "Living Better With Heart Failure" Booklet, HF zone tool, & Daily Weight Tracker Tool.  Patient has scale at home: yes Patient has pill box at home: yes    High Risk Criteria for Readmission and/or Poor Patient Outcomes: Heart failure hospital admissions (last 6 months): 0  No Show rate: 2% Difficult social situation: No, lives with wife Demonstrates medication adherence: yes Primary Language: English Literacy level: Reading, writing, and comprehension  Barriers of Care:   Diet/ fluid restrictions ( salt/ soda) Daily weights  Considerations/Referrals:   Referral made to Heart Failure Pharmacist Stewardship: Yes Referral made to Heart Failure CSW/NCM TOC: No Referral made to Heart & Vascular TOC clinic: Yes, 11/06/2023 @ 10 am  Items for Follow-up on DC/TOC: Continued HF education Diet/ fluid restrictions ( salt/ soda) Daily weights   Rhae Hammock, BSN, RN Heart Failure Teacher, adult education Only

## 2023-10-20 NOTE — Progress Notes (Signed)
Eduardo Butler  KGU:[id number] DOB: 03-28-1951 DOA: 10/12/2023 PCP: Joycelyn Rua, MD    Brief Narrative:  72 year old with a history of HLD, CAD, PE on chronic Coumadin, lupus anticoagulant, PAF, DM2, HTN, and OSA who presented to the ED 10/12/2023 under the guidance of his cardiologist due to anemia and AKI appreciated on outpatient labs.  In the ER he was found to have a hemoglobin of 8.3 with heme-negative stools.  His heart rate was noted to be in the 40s.  Goals of Care:   Code Status: Full Code   DVT prophylaxis: SCDs Start: 10/12/23 1358 Lovenox  Interim Hx: No acute events reported overnight.  Afebrile.  Vital signs stable.  Tolerating cardiac cath well.  Edema significantly improved.  Assessment & Plan:  Acute exacerbation of chronic diastolic CHF -pulmonary hypertension Responding well to diuresis - EF 55-60% via TTE this admission but unable to comment on diastolic function - net negative approximately 22L since admission - total protein 6.2 with albumin of 3.3 -peripheral edema markedly improved on exam today -right heart cath 11/22 revealed mild pulmonary hypertension with elevated LVEDP  Filed Weights   10/18/23 0541 10/19/23 0617 10/20/23 0513  Weight: 103.1 kg 99.5 kg 95.5 kg     Sinus bradycardia Heart rate in the 40s at presentation -evaluated by cardiology -holding beta-blocker -TSH 1.8 -heart rate stable presently at 46-56  Paroxysmal atrial fibrillation Home beta-blocker discontinued due to bradycardia -on warfarin at home -warfarin presently on hold for cardiac cath with Lovenox bridge -resume warfarin tonight  Acute kidney injury Patient was hypervolemic at presentation - baseline creatinine 0.9 - creatinine 1.5 at presentation - holding steady/improving with diuresis for now - ARB on hold  Recent Labs  Lab 10/16/23 1102 10/17/23 0701 10/18/23 0552 10/19/23 0428 10/20/23 0439  CREATININE 1.58* 1.24 1.46* 1.44* 1.35*     Mild hypokalemia Due to  diuresis -continue to supplement and follow -magnesium is normal  CAD status post CABG 2003 Continue aspirin Zetia and rosuvastatin -nuclear PET 10/24 revealed a small fixed defect in the mid lateral wall suggestive of prior infarct -did report persistent chest pain with shortness of breath on exertion for approximately 6 months at presentation - L heart cath 11/22 noted widely patent grafts   Anemia with B12 deficiency No obvious sign of blood loss -stool heme-negative at presentation -B12 low at 169 with replacement ongoing  HTN Blood pressure well-controlled  DM2 A1c 6.2 -CBGs controlled  Family Communication: Spoke with patient and family member at bedside Disposition: From home, anticipate discharge home 11/23   Objective: Blood pressure 110/61, pulse (!) 51, temperature 97.8 F (36.6 C), temperature source Oral, resp. rate 17, height 5\' 6"  (1.676 m), weight 95.5 kg, SpO2 94%.  Intake/Output Summary (Last 24 hours) at 10/20/2023 0916 Last data filed at 10/20/2023 0600 Gross per 24 hour  Intake 760 ml  Output 6275 ml  Net -5515 ml   Filed Weights   10/18/23 0541 10/19/23 0617 10/20/23 0513  Weight: 103.1 kg 99.5 kg 95.5 kg    Examination: General: No acute respiratory distress Lungs: Clear to auscultation bilaterally without wheezes or crackles Cardiovascular: Regular rate and rhythm without murmur  Abdomen: NT/ND, soft, BS positive Extremities: 2+ pedal edema with Unna boots in place - bilateral lower extremities with only 1+ edema at this time above feet  CBC: Recent Labs  Lab 10/18/23 0552 10/19/23 0428 10/20/23 0439  WBC 4.5 4.8 4.9  HGB 8.4* 8.2* 9.2*  HCT 26.4* 25.4* 28.1*  MCV 87.7 87.3 87.3  PLT 173 167 186   Basic Metabolic Panel: Recent Labs  Lab 10/18/23 0552 10/19/23 0428 10/20/23 0439  NA 137 136 135  K 3.4* 3.5 3.3*  CL 99 100 94*  CO2 29 29 29   GLUCOSE 75 85 87  BUN 24* 27* 26*  CREATININE 1.46* 1.44* 1.35*  CALCIUM 8.5* 8.4* 8.9   MG  --  2.4  --    GFR: Estimated Creatinine Clearance: 53.5 mL/min (A) (by C-G formula based on SCr of 1.35 mg/dL (H)).   Scheduled Meds:  ALPRAZolam  0.5 mg Oral QHS   amiodarone  100 mg Oral Daily   aspirin EC  81 mg Oral Daily   empagliflozin  10 mg Oral Daily   enoxaparin (LOVENOX) injection  100 mg Subcutaneous Q12H   ezetimibe  10 mg Oral Daily   folic acid  1 mg Oral Daily   furosemide  80 mg Intravenous BID   insulin aspart  0-9 Units Subcutaneous TID WC   metolazone  5 mg Oral Once   pantoprazole  80 mg Oral Daily   polyethylene glycol  17 g Oral Daily   potassium chloride  40 mEq Oral BID   rosuvastatin  20 mg Oral Daily   sodium chloride flush  10 mL Intravenous Q12H      LOS: 8 days   Lonia Blood, MD [facility] Office  [phone] Pager - Text Page per Loretha Stapler  If 7PM-7AM, please contact night-coverage per Amion 10/20/2023, 9:16 AM

## 2023-10-20 NOTE — Progress Notes (Signed)
PHARMACY - ANTICOAGULATION CONSULT NOTE  Pharmacy Consult for warfarin Indication:  history of PE, lupus anticoagulant positive  Allergies  Allergen Reactions   Morphine And Codeine     hallucinations      Patient Measurements: Height: 5\' 6"  (167.6 cm) Weight: 95.5 kg (210 lb 9.6 oz) IBW/kg (Calculated) : 63.8  Vital Signs: Temp: 97.5 F (36.4 C) (11/22 1231) Temp Source: Oral (11/22 1231) BP: 108/49 (11/22 1231) Pulse Rate: 43 (11/22 1231)  Labs: Recent Labs    10/18/23 0552 10/19/23 0428 10/20/23 0439  HGB 8.4* 8.2* 9.2*  HCT 26.4* 25.4* 28.1*  PLT 173 167 186  LABPROT  --  17.4* 16.5*  INR  --  1.4* 1.3*  CREATININE 1.46* 1.44* 1.35*    Estimated Creatinine Clearance: 53.5 mL/min (A) (by C-G formula based on SCr of 1.35 mg/dL (H)).   Medical History: Past Medical History:  Diagnosis Date   Anxiety    Arthritis    CHF (congestive heart failure) (HCC)    Coronary artery disease    Diabetes (HCC)    H/O cardiac catheterization 08/25/2006   normal cath-patent grafts   H/O Doppler ultrasound 02/21/2011   lower ext.venous doppler,, no treatment except support stockings are recommended if clinically indicated   H/O echocardiogram 12/02/2010   EF 55% , no significant abnormalities   History of stress test 01/04/2013   Lexiscan Myoview ; scattered PVC's ; LV EF 54% ; Normal stress test    Hx of CABG 2003 (LIMA to LAD, free radial to LCx, sequential vein graft to acute marginal and distal RCA, 10/13/2023   Hyperlipidemia    Hypertension    Obesity    Peroneal DVT (deep venous thrombosis) (HCC)    Shortness of breath     Assessment: 72yoM on warfarin for history of recurrent PE, peroneal DVT, pAF, 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 Warfarin held on admission for procedures and enoxaparin bridge started while INR < 2.  PTA warfarin regimen = 2 mg x2 days, then 1 mg and repeat (11/12 2 mg, 11/13 1 mg, 11/14 2 mg).  Pt s/p  cardiac cath 11/22, ok to resume warfarin postop and enoxaparin 6h after sheath pull per MD. INR 1.3, CBC stable.  Goal of Therapy:  INR 2-3 Monitor platelets by anticoagulation protocol: Yes   Plan:  Continue enoxaparin 100mg  SQ BID - resume tonight at 2000 Warfarin 3mg  PO x1 Daily INR  Fredonia Highland, PharmD, BCPS, [facility] Clinical Pharmacist [phone] Please check AMION for all [facility] Pharmacy numbers 10/20/2023

## 2023-10-20 NOTE — Progress Notes (Signed)
Heart Failure Stewardship Pharmacist Progress Note   PCP: Joycelyn Rua, MD PCP-Cardiologist: Nanetta Batty, MD    HPI:  72 yo M with PMH of CHF, CAD s/p CABG 2023, recurrent PE on warfarin, HTN, HLD, T2DM, and afib.  He presented for cardiology follow up on 11/13 when he complained of ongoing dizziness (denies syncope) and fatigue. EKG with sinus brady, rate 41. Hgb drop on labs (8.9 down from 12.2 in 10/2022). Advised to go to the ED for evaluation. Reports chronic LE edema but worse than baseline. BNP 385.4. Hgb down further to 8.5. CXR with cardiomegaly and mild pulmonary vascular congestion. ECHO 11/15 showed LVEF 55-60%, RV mildly reduced, trivial MR. Plan for [facility] once euvolemic and if hgb stable.   Reports no shortness of breath at rest. Still with 1+ edema in LE but improving. Currently in the donut hole but states that he can afford medications with donut hole pricing (paid >$200 x 2 for Ozempic). Was concerned that Ozempic was causing the issues he's been experiencing lately so he self-discontinued this 3 weeks ago.   S/p R/LHC today and found to have mild pulmonary HTN with mildly elevated EDP. Patent grafts and no change in coronary anatomy from 2021.  Current HF Medications: Diuretic: furosemide 80 mg IV BID + metolazone 5 mg x 1 SGLT2i: Jardiance 10 mg daily  Prior to admission HF Medications: Diuretic: furosemide 80 mg daily Beta blocker: carvedilol 6.25 mg BID ACE/ARB/ARNI: losartan 25 mg daily  Pertinent Lab Values: Serum creatinine 1.35, BUN 26, Potassium 3.4, Sodium 137, BNP 385.4, Magnesium 2.4, A1c 6.2   Vital Signs: Weight: 210 lbs (237 lbs on admission) Blood pressure: 100-110/50s  Heart rate: 40-50s  I/O: net -3.4L yesterday; net -22.8L since admission  Medication Assistance / Insurance Benefits Check: Does the patient have prescription insurance?  Yes Type of insurance plan: [employer]  Outpatient Pharmacy:  Prior to  admission outpatient pharmacy: [facility] Is the patient willing to use [facility] TOC pharmacy at discharge? Yes Is the patient willing to transition their outpatient pharmacy to utilize a [facility] outpatient pharmacy?   No    Assessment: 1. Acute on chronic diastolic CHF (LVEF 55-60%). NYHA class III symptoms. - Remains fluid overloaded on cath. Creatinine improving. Has had good urine output (intake not well documented). Continue furosemide 80 mg IV BID, and had good response to metolazone yesterday. Agree with redosing today. Daily weights. Keep K>4 and Mg>2. Consider torsemide at discharge.  - Holding BB with bradycardia - Held losartan with AKI - s/p cath today - Consider adding MRA prior to discharge once BP improves - Continue Jardiance 10 mg daily - Would stop actos on discharge as this can worsen HF and increase risk of hospitalizations   Plan: 1) Medication changes recommended at this time: - Continue IV lasix + metolazone given cath findings - Consider torsemide at discharge - Stop Actos on discharge  2) Patient assistance: - Appears patient is in the coverage gap (donut hole) - Farxiga copay $139 - Jardiance copay $146  - May be able to use free 30 day trial + samples if needed to get to the new year  3)  Education  - Patient has been educated on current HF medications and potential additions to HF medication regimen - Patient verbalizes understanding that over the next few months, these medication doses may change and more medications may be added to optimize HF regimen - Patient has been educated on basic disease state pathophysiology and  goals of therapy   Sharen Hones, PharmD, BCPS Heart Failure Stewardship Pharmacist Phone [phone]

## 2023-10-20 NOTE — H&P (View-Only) (Signed)
Patient Name: Eduardo Butler Date of Encounter: 10/20/2023 [facility] Cardiologist: Nanetta Batty, MD   Interval Summary  .    Feeling well.  States that he can lay flat for cath.  Breathing much better.   Vital Signs .    Vitals:   10/19/23 1437 10/19/23 1919 10/19/23 1920 10/20/23 0513  BP: (!) 105/53  (!) 103/52 110/61  Pulse:   (!) 52 (!) 51  Resp:   20 17  Temp: 97.6 F (36.4 C)  98.9 F (37.2 C) 97.8 F (36.6 C)  TempSrc: Oral Oral Oral Oral  SpO2:   94% 94%  Weight:    95.5 kg  Height:        Intake/Output Summary (Last 24 hours) at 10/20/2023 4540 Last data filed at 10/20/2023 0600 Gross per 24 hour  Intake 1000 ml  Output 6275 ml  Net -5275 ml      10/20/2023    5:13 AM 10/19/2023    6:17 AM 10/18/2023    5:41 AM  Last 3 Weights  Weight (lbs) 210 lb 9.6 oz 219 lb 4.8 oz 227 lb 4.8 oz  Weight (kg) 95.528 kg 99.474 kg 103.103 kg      Telemetry/ECG    Sinus bradycardia.  Rate 40-50s - Personally Reviewed  Echo 10/13/23:    1. Left ventricular ejection fraction, by estimation, is 55 to 60%. The  left ventricle has normal function. The left ventricle has no regional  wall motion abnormalities. Left ventricular diastolic function could not  be evaluated.   2. Right ventricular systolic function is mildly reduced. The right  ventricular size is mildly enlarged. There is mildly elevated pulmonary  artery systolic pressure. The estimated right ventricular systolic  pressure is 41.4 mmHg.   3. The mitral valve is normal in structure. Trivial mitral valve  regurgitation. No evidence of mitral stenosis.   4. The aortic valve is tricuspid. Aortic valve regurgitation is not  visualized. Aortic valve sclerosis/calcification is present, without any  evidence of aortic stenosis.   5. The inferior vena cava is dilated in size with <50% respiratory  variability, suggesting right atrial pressure of 15 mmHg.   Physical Exam .    VS:  BP 110/61 (BP  Location: Right Arm)   Pulse (!) 51   Temp 97.8 F (36.6 C) (Oral)   Resp 17   Ht 5\' 6"  (1.676 m)   Wt 95.5 kg   SpO2 94%   BMI 33.99 kg/m  , BMI Body mass index is 33.99 kg/m. GENERAL:  Well appearing HEENT: Pupils equal round and reactive, fundi not visualized, oral mucosa unremarkable NECK:  +  jugular venous distention to 1 cm above clavicle sitting upright.  +HJR. LUNGS:  Clear to auscultation bilaterally HEART:  Bradycardic.  PMI not displaced or sustained,S1 and S2 within normal limits, no S3, no S4, no clicks, no rubs, II/VI systolic murmur at the LUSB ABD:  Flat, positive bowel sounds normal in frequency in pitch, no bruits, no rebound, no guarding, no midline pulsatile mass, no hepatomegaly, no splenomegaly EXT:  2 plus pulses throughout, 2+ LE edema to the lower tibia bilaterally.  Legs wrapped in Unna boots.  No cyanosis no clubbing SKIN:  No rashes no nodules NEURO:  Cranial nerves II through XII grossly intact, motor grossly intact throughout PSYCH:  Cognitively intact, oriented to person place and time   Assessment & Plan .     25M with with HFpEF, CAD status post CABG (  LIMA to LAD, free radial to LCx, sequential vein graft to acute marginal and distal RCA), persistent atrial fibrillation, recurrent pulmonary embolism, lupus anticoagulant on Coumadin, diabetes, hyperlipidemia, and iron deficiency anemia admitted with symptomatic bradycardia and acute on chronic heart failure.  # Acute on chronic HFpEF: Volume overloaded on admission.  He continues to diurese nicely.  He was net -5.2L yesterday.  He is -21.9 L since admission and remains significantly volume overloaded.  Renal function stable. He received a dose of metolazone yesterday and renal function improved.  We will supplement his potassium.  Give metolazone again today before his evening dose.  Hopefully we can transition to oral diuretics tomorrow.  Added Jardiance 11/20.  Will add MRA post-cath.    # Symptomatic  bradycardia: Initially reported reported near syncope and exertional chest tightness.  Home carvedilol was held.  He continues to be bradycardic.  He has no symptoms.  No plan for pacemaker at this time.  # CAD: # Hyperlipidemia: Patient presented with chest pain and shortness of breath that have been ongoing for 6 months.  Nuclear PET 08/2023 revealed a small fixed defects in the mid lateral wall consistent with prior infarct but no ischemia.  LVEF 45%.  On echo LVEF was 55-60%.  Status post four-vessel CABG.   Plan from the admitting team was for left and right heart cath once euvolemic if his hemoglobin is stable.  Patient feels that he needs a new stent.  Continue statin and Zetia.  He is eager to home.  We will plan for LHC/RHC today.  Decide on oral diuretics (would use bumetanide or torsemide) post cath to see if he can continue diuresing at home.  Informed Consent   Shared Decision Making/Informed Consent The risks [stroke (1 in 1000), death (1 in 1000), kidney failure [usually temporary] (1 in 500), bleeding (1 in 200), allergic reaction [possibly serious] (1 in 200)], benefits (diagnostic support and management of coronary artery disease) and alternatives of a cardiac catheterization were discussed in detail with Mr. Angelini and he is willing to proceed.    # Persistent atrial fibrillation: Currently in sinus bradycardia.  Home carvedilol has been held but he remains on amiodarone.  Home warfarin is on hold.  Will check INR.  INR is now <2 so he was started on therapeutic Lovenox.  Holding warfarin with plans for cath tomorrow.  Resume warfarin post-cath.  # Anemia: Thought to be due to B12 deficiency.  Admitted with a hemoglobin of 8.9.  Iron/ferrtin levels are low normal.  MCV is normal.  Exertional chest tightness.  Unclear how much of it is due to obstructive coronary disease versus anemia and bradycardia in the setting of underlying coronary disease.    # Diabetes: Home Ozempic on  hold.  Adding dding SGLT2 inhibitor as above.   # OSA: Continue CPAP   For questions or updates, please contact [facility] Please consult [email or web address] for contact info under        Signed, Chilton Si, MD

## 2023-10-21 ENCOUNTER — Other Ambulatory Visit (HOSPITAL_COMMUNITY): Payer: Self-pay

## 2023-10-21 DIAGNOSIS — R001 Bradycardia, unspecified: Secondary | ICD-10-CM | POA: Diagnosis not present

## 2023-10-21 LAB — BASIC METABOLIC PANEL
Anion gap: 15 (ref 5–15)
BUN: 29 mg/dL — ABNORMAL HIGH (ref 8–23)
CO2: 33 mmol/L — ABNORMAL HIGH (ref 22–32)
Calcium: 9.6 mg/dL (ref 8.9–10.3)
Chloride: 87 mmol/L — ABNORMAL LOW (ref 98–111)
Creatinine, Ser: 1.63 mg/dL — ABNORMAL HIGH (ref 0.61–1.24)
GFR, Estimated: 44 mL/min — ABNORMAL LOW (ref >60)
Glucose, Bld: 119 mg/dL — ABNORMAL HIGH (ref 70–99)
Potassium: 3.5 mmol/L (ref 3.5–5.1)
Sodium: 135 mmol/L (ref 135–145)

## 2023-10-21 LAB — PROTIME-INR
INR: 1.2 (ref 0.8–1.2)
Prothrombin Time: 15.4 s — ABNORMAL HIGH (ref 11.4–15.2)

## 2023-10-21 LAB — GLUCOSE, CAPILLARY: Glucose-Capillary: 132 mg/dL — ABNORMAL HIGH (ref 70–99)

## 2023-10-21 MED ORDER — EMPAGLIFLOZIN 10 MG PO TABS
10.0000 mg | ORAL_TABLET | Freq: Every day | ORAL | 2 refills | Status: DC
  Filled 2023-10-21: qty 30, 30d supply, fill #0

## 2023-10-21 MED ORDER — WARFARIN SODIUM 2 MG PO TABS
3.0000 mg | ORAL_TABLET | Freq: Once | ORAL | Status: AC
  Administered 2023-10-21: 3 mg via ORAL
  Filled 2023-10-21: qty 1

## 2023-10-21 MED ORDER — VITAMIN B-12 1000 MCG PO TABS: 1000.0000 ug | ORAL_TABLET | Freq: Every day | ORAL | Status: AC

## 2023-10-21 MED ORDER — TORSEMIDE 20 MG PO TABS
40.0000 mg | ORAL_TABLET | Freq: Two times a day (BID) | ORAL | 0 refills | Status: DC
  Filled 2023-10-21: qty 360, 90d supply, fill #0

## 2023-10-21 MED ORDER — AMIODARONE HCL 200 MG PO TABS
100.0000 mg | ORAL_TABLET | Freq: Every day | ORAL | 0 refills | Status: DC
  Filled 2023-10-21: qty 45, 90d supply, fill #0

## 2023-10-21 NOTE — Progress Notes (Signed)
Progress Note  Patient Name: Eduardo Butler Date of Encounter: 10/21/2023  Primary Cardiologist: Nanetta Batty, MD   Subjective   Patient seen and examined at his bedside.  Inpatient Medications    Scheduled Meds:  ALPRAZolam  0.5 mg Oral QHS   amiodarone  100 mg Oral Daily   aspirin EC  81 mg Oral Daily   empagliflozin  10 mg Oral Daily   enoxaparin (LOVENOX) injection  100 mg Subcutaneous Q12H   ezetimibe  10 mg Oral Daily   folic acid  1 mg Oral Daily   insulin aspart  0-9 Units Subcutaneous TID WC   pantoprazole  80 mg Oral Daily   polyethylene glycol  17 g Oral Daily   rosuvastatin  20 mg Oral Daily   sodium chloride flush  3 mL Intravenous Q12H   torsemide  40 mg Oral BID   warfarin  3 mg Oral ONCE-1600   Warfarin - Pharmacist Dosing Inpatient   Does not apply q1600   Continuous Infusions:  PRN Meds: acetaminophen, bisacodyl   Vital Signs    Vitals:   10/20/23 1346 10/20/23 1927 10/20/23 1955 10/21/23 0427  BP: 105/60 105/60 106/61 121/71  Pulse: (!) 49 (!) 57 (!) 57 (!) 57  Resp:  18  17  Temp:  97.9 F (36.6 C)  97.8 F (36.6 C)  TempSrc:  Oral  Oral  SpO2: 98% 97% 98% 96%  Weight:    89.9 kg  Height:        Intake/Output Summary (Last 24 hours) at 10/21/2023 1128 Last data filed at 10/21/2023 0430 Gross per 24 hour  Intake 606 ml  Output 6350 ml  Net -5744 ml   Filed Weights   10/19/23 0617 10/20/23 0513 10/21/23 0427  Weight: 99.5 kg 95.5 kg 89.9 kg    Telemetry    Sinus rhythm - Personally Reviewed  ECG     - Personally Reviewed  Physical Exam     General: Comfortable, sitting up in a chair Head: Atraumatic, normal size  Eyes: PEERLA, EOMI  Neck: Supple, normal JVD Cardiac: Normal S1, S2; RRR; no murmurs, rubs, or gallops Lungs: Clear to auscultation bilaterally Abd: Soft, nontender, no hepatomegaly  Ext: warm, no edema Musculoskeletal: No deformities, BUE and BLE strength normal and equal Skin: Warm and dry, no rashes    Neuro: Alert and oriented to person, place, time, and situation, CNII-XII grossly intact, no focal deficits  Psych: Normal mood and affect   Labs    Chemistry Recent Labs  Lab 10/16/23 0741 10/16/23 1102 10/17/23 0701 10/18/23 0552 10/19/23 0428 10/20/23 0439 10/20/23 1012 10/20/23 1013 10/20/23 1020 10/21/23 0335  NA 136  --  137 137 136 135   < > 137 137 135  K 3.4*  --  3.9 3.4* 3.5 3.3*   < > 3.3* 3.4* 3.5  CL 101  --  101 99 100 94*  --   --   --  87*  CO2 28  --  29 29 29 29   --   --   --  33*  GLUCOSE 116*  --  97 75 85 87  --   --   --  119*  BUN 21  --  24* 24* 27* 26*  --   --   --  29*  CREATININE 1.65*   < > 1.24 1.46* 1.44* 1.35*  --   --   --  1.63*  CALCIUM 8.5*  --  8.5* 8.5* 8.4*  8.9  --   --   --  9.6  PROT 6.0*  --  6.1* 6.2*  --   --   --   --   --   --   ALBUMIN 3.2*  --  3.3* 3.3*  --   --   --   --   --   --   AST 65*  --  60* 54*  --   --   --   --   --   --   ALT 48*  --  47* 46*  --   --   --   --   --   --   ALKPHOS 86  --  87 81  --   --   --   --   --   --   BILITOT 1.0  --  0.9 0.9  --   --   --   --   --   --   GFRNONAA 44*   < > >60 51* 52* 56*  --   --   --  44*  ANIONGAP 7  --  7 9 7 12   --   --   --  15   < > = values in this interval not displayed.     Hematology Recent Labs  Lab 10/18/23 0552 10/19/23 0428 10/20/23 0439 10/20/23 1012 10/20/23 1013 10/20/23 1020  WBC 4.5 4.8 4.9  --   --   --   RBC 3.01* 2.91* 3.22*  --   --   --   HGB 8.4* 8.2* 9.2* 9.5* 9.5* 9.5*  HCT 26.4* 25.4* 28.1* 28.0* 28.0* 28.0*  MCV 87.7 87.3 87.3  --   --   --   MCH 27.9 28.2 28.6  --   --   --   MCHC 31.8 32.3 32.7  --   --   --   RDW 17.5* 17.5* 17.4*  --   --   --   PLT 173 167 186  --   --   --     Cardiac EnzymesNo results for input(s): "TROPONINI" in the last 168 hours. No results for input(s): "TROPIPOC" in the last 168 hours.   BNPNo results for input(s): "BNP", "PROBNP" in the last 168 hours.   DDimer No results for input(s):  "DDIMER" in the last 168 hours.   Radiology    CARDIAC CATHETERIZATION  Addendum Date: 10/20/2023     Ost LAD to Prox LAD lesion is 100% stenosed.   Mid Cx lesion is 95% stenosed.   Mid RCA lesion is 100% stenosed.   RV Branch lesion is 99% stenosed.   2nd Mrg lesion is 100% stenosed.   Dist Cx lesion is 100% stenosed. Right  & left Heart Catheterization + graft angiography 10/20/23: Hemodynamic data: RA 10/8, mean 8 mmHg. RV 50/2, EDP 11 mmHg PA 47/10, mean 25 mmHg.  PA saturation 72%. PW 17/27, mean 15 mmHg.  Aortic saturation 95%. C/o 9.35/CI 4.62.  QP/QS 1.00.  Findings consistent with mild pulm hypertension with elevated LVEDP. Angiographic data: LM: Large-caliber vessel.  Mildly calcified. LAD: Occluded in the proximal segment. LCx: Severely diffusely diseased, mid segment has a 95% stenosis.  Distal circumflex is occluded.  Gives origin to a very large OM 2 which is occluded in the ostium and also has a secondary branch.   RI: Small to moderate-sized vessel, free of disease. RCA: Dominant vessel, giving origin to a very large acute marginal branch which has  secondary branches and after its origin RCA is occluded.  Distal RCA has large PDA and PL branches. SVG to RV acute marginal and distal RCA: Widely patent. Free radial graft to OM 2: Widely patent. LIMA to LAD: Widely patent. Impression and recommendations: Patient has mild pulm hypertension with mildly elevated EDP, appears to be getting closer to euvolemia.  Widely patent grafts and no change in coronary anatomy from 2021.  Continue medical therapy.  33 mL contrast utilized.  Result Date: 10/20/2023   Mid Cx lesion is 95% stenosed.   2nd Mrg lesion is 100% stenosed.   Dist Cx lesion is 100% stenosed. Right  & left Heart Catheterization + graft angiography 10/20/23: Hemodynamic data: RA 10/8, mean 8 mmHg. RV 50/2, EDP 11 mmHg PA 47/10, mean 25 mmHg.  PA saturation 72%. PW 17/27, mean 15 mmHg.  Aortic saturation 95%. C/o 9.35/CI 4.62.  QP/QS  1.00.  Findings consistent with mild pulm hypertension with elevated LVEDP. Angiographic data: LM: Large-caliber vessel.  Mildly calcified. LAD: Occluded in the proximal segment. LCx: Severely diffusely diseased, mid segment has a 95% stenosis.  Distal circumflex is occluded.  Gives origin to a very large OM 2 which is occluded in the ostium and also has a secondary branch.   RI: Small to moderate-sized vessel, free of disease. RCA: Dominant vessel, giving origin to a very large acute marginal branch which has secondary branches and after its origin RCA is occluded.  Distal RCA has large PDA and PL branches. SVG to RV acute marginal and distal RCA: Widely patent. Free radial graft to OM 2: Widely patent. LIMA to LAD: Widely patent. Impression and recommendations: Patient has mild pulm hypertension with mildly elevated EDP, appears to be getting closer to euvolemia.  Widely patent grafts and no change in coronary anatomy from 2021.  Continue medical therapy.  33 mL contrast utilized.    Cardiac Studies   Echo and LHC reviewed   Patient Profile     72 y.o. male with HFpEF, CAD status post CABG (LIMA to LAD, free radial to LCx, sequential vein graft to acute marginal and distal RCA), persistent atrial fibrillation, recurrent pulmonary embolism, lupus anticoagulant on Coumadin, diabetes, hyperlipidemia, and iron deficiency anemia admitted with symptomatic bradycardia and acute on chronic heart failure.   Assessment & Plan    Acute on chronic heart failure with preserved ejection fraction  Symptomatic bradycardia CAD  Hyperlipidemia  Persistent atrial fibrillation  Diabetes  OSA  Status post LHC yesterday, no indication for PCI all grafts are patent. Mild pulmonary hypertension. Has been transitioned to oral diuretics, I agree with this. Please continue with torsemide 40 mg BID. Continue the patient on his Aspirin and statin  In terms of the his afib - continue amiodarone and warfarin DM per  primary team   From a CV perspective - ok to be discharge to home.    For questions or updates, please contact [facility] Please consult [email or web address] for contact info under Cardiology/STEMI.      Signed, Thomasene Ripple, DO  10/21/2023, 11:28 AM

## 2023-10-21 NOTE — Discharge Summary (Addendum)
DISCHARGE SUMMARY  Eduardo Butler  MR#: [id number]  DOB:[birth year]  Date of Admission: 10/12/2023 Date of Discharge: 10/21/2023  Attending Physician:Eduardo Schifano Silvestre Gunner, MD  Patient's WUJ:[id number], Eduardo Senior, MD  Disposition: Discharge home  Follow-up Appts:  Follow-up Information     [facility]. Go in 16 day(s).   Specialty: Cardiology Why: Hospital follow up 11/06/2023 @ 10 am PLEASE bring a current medication list to appointment FREE valet parking, Entrance C, [address] Contact information: [address] [place] [address] [phone]        Joycelyn Rua, MD Follow up in 3 day(s).   Specialty: Family Medicine Contact information: [address] [place] [address] [phone]                 Tests Needing Follow-up: -Recheck renal function to ensure it is stable with adjustment in diuretic -Recheck potassium as it may require initiation of scheduled supplementation in the face of ongoing diuretic use -Recheck of B12 level in 6-12 weeks is suggested as discussed below -INR should be checked in 3-5 days   Discharge Diagnoses: Acute exacerbation of chronic diastolic CHF - pulmonary hypertension Sinus bradycardia Paroxysmal atrial fibrillation Acute kidney injury Mild hypokalemia CAD status post CABG 2003 Anemia with B12 deficiency HTN DM2  Initial presentation: 72 year old with a history of HLD, CAD, PE on chronic Coumadin, lupus anticoagulant, PAF, DM2, HTN, and OSA who presented to the ED 10/12/2023 under the guidance of his cardiologist due to anemia and AKI appreciated on outpatient labs. In the ER he was found to have a hemoglobin of 8.3 with heme-negative stools. His heart rate was noted to be in the 40s.   Hospital Course:  Acute exacerbation of chronic diastolic CHF -pulmonary hypertension Responded well to diuresis - EF 55-60% via TTE this admission but  unable to comment on diastolic function - net negative approximately 28L since admission - total protein 6.2 with albumin of 3.3 - peripheral edema markedly improved on follow-up exam - right heart cath 11/22 revealed mild pulmonary hypertension with elevated LVEDP  Filed Weights   10/19/23 0617 10/20/23 0513 10/21/23 0427  Weight: 99.5 kg 95.5 kg 89.9 kg     Sinus bradycardia Heart rate in the 40s at presentation -evaluated by cardiology -holding beta-blocker -TSH 1.8 -heart rate stable at 46-56 at time of discharge   Paroxysmal atrial fibrillation Home beta-blocker discontinued due to bradycardia -on warfarin at home -warfarin was initially held for cardiac cath with Lovenox bridge -warfarin resumed at usual home dose 11/22 -patient to be discharged on warfarin alone with no Lovenox overlap - eh will need a rechck of his INR in 3-5 days (he tells me his PCP follows his INR, and that he should not have any problem getting that checked early next week)   Acute kidney injury Patient was hypervolemic at presentation - baseline creatinine 0.9 - creatinine 1.5 at presentation - improved with diuresis initially, with slight upward deflection following cardiac cath likely contrast associated -routine follow-up in outpatient setting in approximately 1 week suggested   Recent Labs  Lab 10/17/23 0701 10/18/23 0552 10/19/23 0428 10/20/23 0439 10/21/23 0335  CREATININE 1.24 1.46* 1.44* 1.35* 1.63*       Mild hypokalemia Due to diuresis -required supplementation during hospital stay- magnesium is normal -recommend recheck potassium in 3 to 5 days in outpatient setting as the patient may require initiation of supplementation as an outpatient   CAD  status post CABG 2003 Continue aspirin Zetia and rosuvastatin -nuclear PET 10/24 revealed a small fixed defect in the mid lateral wall suggestive of prior infarct -did report persistent chest pain with shortness of breath on exertion for approximately 6  months at presentation - L heart cath 11/22 noted widely patent grafts    Anemia with B12 deficiency No obvious sign of blood loss -stool heme-negative at presentation -B12 low at 169 with replacement ongoing -outpatient follow-up with B12 level in 6 to 8 weeks will be necessary to determine if oral absorption is adequate   HTN Blood pressure well-controlled   DM2 A1c 6.2 -CBGs controlled  Allergies as of 10/21/2023       Reactions   Morphine And Codeine    hallucinations        Medication List     STOP taking these medications    albuterol 108 (90 Base) MCG/ACT inhaler Commonly known as: VENTOLIN HFA   ammonium lactate 12 % lotion Commonly known as: LAC-HYDRIN   carvedilol 6.25 MG tablet Commonly known as: COREG   fluticasone 50 MCG/ACT nasal spray Commonly known as: FLONASE   furosemide 80 MG tablet Commonly known as: LASIX   losartan 25 MG tablet Commonly known as: COZAAR   pioglitazone 45 MG tablet Commonly known as: ACTOS       TAKE these medications    acetaminophen 500 MG tablet Commonly known as: TYLENOL Take 1,000-1,500 mg by mouth every 6 (six) hours as needed for moderate pain.   ALPRAZolam 0.5 MG tablet Commonly known as: XANAX Take 0.25-0.5 mg by mouth at bedtime.   amiodarone 200 MG tablet Commonly known as: PACERONE Take 0.5 tablets (100 mg total) by mouth daily. Start taking on: October 22, 2023 What changed: See the new instructions.   aspirin EC 81 MG tablet Take 81 mg by mouth daily.   BD Pen Needle Nano 2nd Gen 32G X 4 MM Misc Generic drug: Insulin Pen Needle BS BID   cyanocobalamin 1000 MCG tablet Commonly known as: VITAMIN B12 Take 1 tablet (1,000 mcg total) by mouth daily.   ezetimibe 10 MG tablet Commonly known as: ZETIA Take 1 tablet (10 mg total) by mouth daily.   folic acid 1 MG tablet Commonly known as: FOLVITE Take 1 mg by mouth daily.   IMMUNE FORMULA PO Take 1 each by mouth daily. Immune: Vitamin C, D  and Zinc. Chew 1 tablets daily.   Jardiance 10 MG Tabs tablet Generic drug: empagliflozin Take 1 tablet (10 mg total) by mouth daily. Start taking on: October 22, 2023   Medical Compression Stockings Misc Knee High 20-30 mm compression stockings   metFORMIN 1000 MG tablet Commonly known as: GLUCOPHAGE Take 500 mg by mouth 2 (two) times daily with a meal.   omeprazole 40 MG capsule Commonly known as: PRILOSEC TAKE ONE CAPSULE BY MOUTH DAILY AS NEEDED FOR HEARTBURN   Ozempic (0.25 or 0.5 MG/DOSE) 2 MG/3ML Sopn Generic drug: Semaglutide(0.25 or 0.5MG /DOS) INJECT 0.25MG  INTO THE SKIN ONCE WEEKLY   rosuvastatin 20 MG tablet Commonly known as: CRESTOR TAKE 1 TABLET(20 MG) BY MOUTH DAILY   torsemide 20 MG tablet Commonly known as: DEMADEX Take 2 tablets (40 mg total) by mouth 2 (two) times daily.   Vitamin D (Ergocalciferol) 1.25 MG (50000 UNIT) Caps capsule Commonly known as: DRISDOL Take 1 capsule (50,000 Units total) by mouth every 7 (seven) days.   warfarin 2 MG tablet Commonly known as: COUMADIN Take 2 mg by mouth See  admin instructions. Alternate 1mg  with 2mg  dose on a schedule of once daily 2mg , 2mg , 1mg  and repeat pattern.   warfarin 1 MG tablet Commonly known as: COUMADIN Take 1 mg by mouth See admin instructions. Alternate 1mg  with 2mg  dose on a schedule of once daily 2mg , 2mg , 1mg  and repeat pattern.        Day of Discharge BP 121/71 (BP Location: Right Arm)   Pulse (!) 57   Temp 97.8 F (36.6 C) (Oral)   Resp 17   Ht 5\' 6"  (1.676 m)   Wt 89.9 kg   SpO2 96%   BMI 31.99 kg/m   Physical Exam: General: No acute respiratory distress Lungs: Clear to auscultation bilaterally without wheezes or crackles Cardiovascular: Regular rate and rhythm without murmur gallop or rub normal S1 and S2 Abdomen: Nontender, nondistended, soft, bowel sounds positive, no rebound, no ascites, no appreciable mass Extremities: No significant cyanosis, clubbing, or edema  bilateral lower extremities  Basic Metabolic Panel: Recent Labs  Lab 10/17/23 0701 10/18/23 0552 10/19/23 0428 10/20/23 0439 10/20/23 1012 10/20/23 1013 10/20/23 1020 10/21/23 0335  NA 137 137 136 135 137 137 137 135  K 3.9 3.4* 3.5 3.3* 3.3* 3.3* 3.4* 3.5  CL 101 99 100 94*  --   --   --  87*  CO2 29 29 29 29   --   --   --  33*  GLUCOSE 97 75 85 87  --   --   --  119*  BUN 24* 24* 27* 26*  --   --   --  29*  CREATININE 1.24 1.46* 1.44* 1.35*  --   --   --  1.63*  CALCIUM 8.5* 8.5* 8.4* 8.9  --   --   --  9.6  MG  --   --  2.4  --   --   --   --   --     CBC: Recent Labs  Lab 10/16/23 1102 10/17/23 0701 10/18/23 0552 10/19/23 0428 10/20/23 0439 10/20/23 1012 10/20/23 1013 10/20/23 1020  WBC 4.6 4.7 4.5 4.8 4.9  --   --   --   HGB 9.5* 8.6* 8.4* 8.2* 9.2* 9.5* 9.5* 9.5*  HCT 29.5* 27.1* 26.4* 25.4* 28.1* 28.0* 28.0* 28.0*  MCV 88.3 88.3 87.7 87.3 87.3  --   --   --   PLT 196 168 173 167 186  --   --   --     Time spent in discharge (includes decision making & examination of pt): 35 minutes  10/21/2023, 5:13 PM   Lonia Blood, MD [facility] Office  [phone]

## 2023-10-21 NOTE — Progress Notes (Signed)
PHARMACY - ANTICOAGULATION CONSULT NOTE  Pharmacy Consult for warfarin Indication:  history of PE, lupus anticoagulant positive  Allergies  Allergen Reactions   Morphine And Codeine     hallucinations      Patient Measurements: Height: 5\' 6"  (167.6 cm) Weight: 89.9 kg (198 lb 3.2 oz) IBW/kg (Calculated) : 63.8  Vital Signs: Temp: 97.8 F (36.6 C) (11/23 0427) Temp Source: Oral (11/23 0427) BP: 121/71 (11/23 0427) Pulse Rate: 57 (11/23 0427)  Labs: Recent Labs    10/19/23 0428 10/20/23 0439 10/20/23 1012 10/20/23 1013 10/20/23 1020 10/21/23 0335 10/21/23 0714  HGB 8.2* 9.2* 9.5* 9.5* 9.5*  --   --   HCT 25.4* 28.1* 28.0* 28.0* 28.0*  --   --   PLT 167 186  --   --   --   --   --   LABPROT 17.4* 16.5*  --   --   --   --  15.4*  INR 1.4* 1.3*  --   --   --   --  1.2  CREATININE 1.44* 1.35*  --   --   --  1.63*  --     Estimated Creatinine Clearance: 43 mL/min (A) (by C-G formula based on SCr of 1.63 mg/dL (H)).   Medical History: Past Medical History:  Diagnosis Date   Anxiety    Arthritis    CHF (congestive heart failure) (HCC)    Coronary artery disease    Diabetes (HCC)    H/O cardiac catheterization 08/25/2006   normal cath-patent grafts   H/O Doppler ultrasound 02/21/2011   lower ext.venous doppler,, no treatment except support stockings are recommended if clinically indicated   H/O echocardiogram 12/02/2010   EF 55% , no significant abnormalities   History of stress test 01/04/2013   Lexiscan Myoview ; scattered PVC's ; LV EF 54% ; Normal stress test    Hx of CABG 2003 (LIMA to LAD, free radial to LCx, sequential vein graft to acute marginal and distal RCA, 10/13/2023   Hyperlipidemia    Hypertension    Obesity    Peroneal DVT (deep venous thrombosis) (HCC)    Shortness of breath     Assessment: 72yoM on warfarin for history of recurrent PE, peroneal DVT, pAF, 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 Warfarin held on  admission for procedures and enoxaparin bridge started while INR < 2.  PTA warfarin regimen = 2 mg x2 days, then 1 mg and repeat (11/12 2 mg, 11/13 1 mg, 11/14 2 mg).  INR this morning was 1.2. Warfarin and enoxaparin resumed last night after cath. CBC stable. Patient eating meals.  Goal of Therapy:  INR 2-3 Monitor platelets by anticoagulation protocol: Yes   Plan:  Continue enoxaparin 100mg  SQ BID Warfarin 3mg  PO x1 Daily INR  Lennie Muckle, PharmD PGY1 Pharmacy Resident 10/21/2023 8:43 AM

## 2023-10-21 NOTE — Discharge Instructions (Signed)
The wrappings on your legs (Unna boots) can stay in place until Monday. On Monday, you can simply unwrap the Unna boots and throw them away. You should then resume wearing your usual compression stockings.

## 2023-10-23 ENCOUNTER — Encounter (HOSPITAL_COMMUNITY): Payer: Self-pay | Admitting: Cardiology

## 2023-10-25 DIAGNOSIS — N179 Acute kidney failure, unspecified: Secondary | ICD-10-CM | POA: Diagnosis not present

## 2023-10-25 DIAGNOSIS — Z7901 Long term (current) use of anticoagulants: Secondary | ICD-10-CM | POA: Diagnosis not present

## 2023-10-25 DIAGNOSIS — D6869 Other thrombophilia: Secondary | ICD-10-CM | POA: Diagnosis not present

## 2023-10-25 LAB — BASIC METABOLIC PANEL: EGFR: 39

## 2023-10-25 LAB — LIPOPROTEIN A (LPA): Lipoprotein (a): 49.7 nmol/L — ABNORMAL HIGH (ref <75.0)

## 2023-10-30 DIAGNOSIS — G4733 Obstructive sleep apnea (adult) (pediatric): Secondary | ICD-10-CM | POA: Diagnosis not present

## 2023-11-01 ENCOUNTER — Telehealth: Payer: Self-pay | Admitting: Cardiovascular Disease

## 2023-11-01 ENCOUNTER — Other Ambulatory Visit: Payer: Self-pay

## 2023-11-01 DIAGNOSIS — I10 Essential (primary) hypertension: Secondary | ICD-10-CM

## 2023-11-01 NOTE — Telephone Encounter (Signed)
Patient identification verified by 2 forms. Eduardo Rail, RN    Called and spoke to patient  Patient states:   -PCP Dr. Lenise Arena is concerned about continued use of torsemide Rx.   -recent labs show worsening kidney function   -Torsemide if removing fluids but PCP is concerned about worsening Kidney   -PCP sent document of lab results  Informed patient per chart review Dr. Allyson Sabal reviewed recent labs and recommends repeat BMP in 1 week  Informed patient can complete labs at 12/13 appointment [facility] message was sent this morning by Dr. Hazle Coca nurse)  Patient verbalized understanding, no questions at this time

## 2023-11-01 NOTE — Telephone Encounter (Signed)
  The patient is calling to get a recommendation for his kidney. According to his PCP, his kidney is not functioning properly, and it worsened while he was in the hospital. He needs a recommendation on what he should take.

## 2023-11-02 DIAGNOSIS — Z7901 Long term (current) use of anticoagulants: Secondary | ICD-10-CM | POA: Diagnosis not present

## 2023-11-06 ENCOUNTER — Ambulatory Visit (HOSPITAL_COMMUNITY)
Admit: 2023-11-06 | Discharge: 2023-11-06 | Disposition: A | Discharge status: Home / Self Care | Payer: PPO | Source: Ambulatory Visit | Attending: Physician Assistant | Admitting: Physician Assistant

## 2023-11-06 ENCOUNTER — Encounter (HOSPITAL_COMMUNITY): Payer: Self-pay

## 2023-11-06 VITALS — BP 130/70 | HR 55 | Wt 202.2 lb

## 2023-11-06 DIAGNOSIS — Z7984 Long term (current) use of oral hypoglycemic drugs: Secondary | ICD-10-CM | POA: Diagnosis not present

## 2023-11-06 DIAGNOSIS — E785 Hyperlipidemia, unspecified: Secondary | ICD-10-CM | POA: Insufficient documentation

## 2023-11-06 DIAGNOSIS — Z794 Long term (current) use of insulin: Secondary | ICD-10-CM | POA: Diagnosis not present

## 2023-11-06 DIAGNOSIS — N179 Acute kidney failure, unspecified: Secondary | ICD-10-CM | POA: Insufficient documentation

## 2023-11-06 DIAGNOSIS — D649 Anemia, unspecified: Secondary | ICD-10-CM | POA: Insufficient documentation

## 2023-11-06 DIAGNOSIS — Z7901 Long term (current) use of anticoagulants: Secondary | ICD-10-CM | POA: Insufficient documentation

## 2023-11-06 DIAGNOSIS — R001 Bradycardia, unspecified: Secondary | ICD-10-CM | POA: Insufficient documentation

## 2023-11-06 DIAGNOSIS — Z951 Presence of aortocoronary bypass graft: Secondary | ICD-10-CM | POA: Insufficient documentation

## 2023-11-06 DIAGNOSIS — Z7982 Long term (current) use of aspirin: Secondary | ICD-10-CM | POA: Diagnosis not present

## 2023-11-06 DIAGNOSIS — I480 Paroxysmal atrial fibrillation: Secondary | ICD-10-CM | POA: Insufficient documentation

## 2023-11-06 DIAGNOSIS — I5032 Chronic diastolic (congestive) heart failure: Secondary | ICD-10-CM | POA: Diagnosis not present

## 2023-11-06 DIAGNOSIS — E119 Type 2 diabetes mellitus without complications: Secondary | ICD-10-CM | POA: Insufficient documentation

## 2023-11-06 DIAGNOSIS — I2510 Atherosclerotic heart disease of native coronary artery without angina pectoris: Secondary | ICD-10-CM | POA: Insufficient documentation

## 2023-11-06 DIAGNOSIS — I110 Hypertensive heart disease with heart failure: Secondary | ICD-10-CM | POA: Insufficient documentation

## 2023-11-06 DIAGNOSIS — R682 Dry mouth, unspecified: Secondary | ICD-10-CM | POA: Diagnosis not present

## 2023-11-06 DIAGNOSIS — Z79899 Other long term (current) drug therapy: Secondary | ICD-10-CM | POA: Diagnosis not present

## 2023-11-06 NOTE — Progress Notes (Addendum)
HEART & VASCULAR TRANSITION OF CARE CONSULT NOTE     Referring Physician: Dr. Sharon Seller Primary Care: Dr. Lenise Arena Primary Cardiologist: Dr. Allyson Sabal  HPI: Referred to clinic by Dr. Sharon Seller with [facility] for heart failure consultation. [name] is a 72 y.o. male with history of CAD s/p CABG 2003 (LIMA to LAD, free radial to LCx, SVG to [facility] distal RCA), paroxysmal atrial fibrillation, chronic HFpEF, recurrent PE with positive lupus anticoagulant on Coumadin, hyperlipidemia, hypertension, type 2 diabetes.  He was admitted last month with acute on chronic CHF and bradycardia.  Anemia also noted with recent drop in hemoglobin from 12.2>>8.5. Cardiology was consulted.  He was diuresed with IV Lasix.  Beta-blocker held due to bradycardia and evidence of chronotropic incompetence. Echo with EF 55-60%, RV mildly reduced, RVSP 41 mmHg, dilated IVC. R/LHC 10/20/23: severe native 3 v CAD, patent bypass grafts, RA mean 8, PA mean 25, PCWP mean 15, LVEDP 16, Fick CO 9.35/CI 4.62. Actose was discontinued. He was started on Jardiance and 80 mg lasix switched to Torsemide 40 BID.  He is here today for hospital follow-up. He has been feeling well since discharge. Reports he is trying to cut back on sodium and fluid intake. Notes he is struggling with fluid restriction as his mouth always feels dry. Home weight at discharge was 192 lb, he was 196.8 lb at home today. His admit weight was 241 lb. He is wearing compression stockings. Legs are the smallest they have been in months. Dyspnea and orthopnea improved. Trying to wear CPAP every night but sometimes dries his mouth out. Used exercise bike for 30 minutes 6 days last week.    Past Medical History:  Diagnosis Date   Anxiety    Arthritis    CHF (congestive heart failure) (HCC)    Coronary artery disease    Diabetes (HCC)    H/O cardiac catheterization 08/25/2006   normal cath-patent grafts   H/O Doppler ultrasound 02/21/2011   lower ext.venous doppler,, no  treatment except support stockings are recommended if clinically indicated   H/O echocardiogram 12/02/2010   EF 55% , no significant abnormalities   History of stress test 01/04/2013   Lexiscan Myoview ; scattered PVC's ; LV EF 54% ; Normal stress test    Hx of CABG 2003 (LIMA to LAD, free radial to LCx, sequential vein graft to acute marginal and distal RCA, 10/13/2023   Hyperlipidemia    Hypertension    Obesity    Peroneal DVT (deep venous thrombosis) (HCC)    Shortness of breath     Current Outpatient Medications  Medication Sig Dispense Refill   acetaminophen (TYLENOL) 500 MG tablet Take 1,000-1,500 mg by mouth every 6 (six) hours as needed for moderate pain.     ALPRAZolam (XANAX) 0.5 MG tablet Take 0.25-0.5 mg by mouth at bedtime.     amiodarone (PACERONE) 200 MG tablet Take 0.5 tablets (100 mg total) by mouth daily. 45 tablet 0   aspirin EC 81 MG tablet Take 81 mg by mouth daily.     cyanocobalamin (VITAMIN B12) 1000 MCG tablet Take 1 tablet (1,000 mcg total) by mouth daily.     Elastic Bandages & Supports (MEDICAL COMPRESSION STOCKINGS) MISC Knee High 20-30 mm compression stockings 2 each 0   empagliflozin (JARDIANCE) 10 MG TABS tablet Take 1 tablet (10 mg total) by mouth daily. 30 tablet 2   ezetimibe (ZETIA) 10 MG tablet Take 1 tablet (10 mg total) by mouth daily. 100 tablet 3  folic acid (FOLVITE) 1 MG tablet Take 1 mg by mouth daily.     Insulin Pen Needle (BD PEN NEEDLE NANO 2ND GEN) 32G X 4 MM MISC BS BID 100 each 0   metFORMIN (GLUCOPHAGE) 1000 MG tablet Take 500 mg by mouth 2 (two) times daily with a meal.     Misc Natural Products (IMMUNE FORMULA PO) Take 1 each by mouth daily. Immune: Vitamin C, D and Zinc. Chew 1 tablets daily.     omeprazole (PRILOSEC) 40 MG capsule TAKE ONE CAPSULE BY MOUTH DAILY AS NEEDED FOR HEARTBURN 90 capsule 3   rosuvastatin (CRESTOR) 20 MG tablet TAKE 1 TABLET(20 MG) BY MOUTH DAILY 30 tablet 8   Semaglutide,0.25 or 0.5MG /DOS, (OZEMPIC, 0.25  OR 0.5 MG/DOSE,) 2 MG/3ML SOPN INJECT 0.25MG  INTO THE SKIN ONCE WEEKLY 3 mL 0   torsemide (DEMADEX) 20 MG tablet Take 2 tablets (40 mg total) by mouth 2 (two) times daily. 360 tablet 0   Vitamin D, Ergocalciferol, (DRISDOL) 1.25 MG (50000 UNIT) CAPS capsule Take 1 capsule (50,000 Units total) by mouth every 7 (seven) days. 5 capsule 0   warfarin (COUMADIN) 1 MG tablet Take 1 mg by mouth See admin instructions. Alternate 1mg  with 2mg  dose on a schedule of once daily 2mg , 2mg , 1mg  and repeat pattern.     warfarin (COUMADIN) 2 MG tablet Take 2 mg by mouth See admin instructions. Alternate 1mg  with 2mg  dose on a schedule of once daily 2mg , 2mg , 1mg  and repeat pattern.     No current facility-administered medications for this encounter.    Allergies  Allergen Reactions   Morphine And Codeine     hallucinations        Social History   Socioeconomic History   Marital status: Married    Spouse name: Not on file   Number of children: Not on file   Years of education: Not on file   Highest education level: Not on file  Occupational History   Not on file  Tobacco Use   Smoking status: Never   Smokeless tobacco: Never  Substance and Sexual Activity   Alcohol use: No    Comment: none since 1979   Drug use: No   Sexual activity: Not on file  Other Topics Concern   Not on file  Social History Narrative   Not on file   Social Determinants of Health   Financial Resource Strain: Low Risk  (04/07/2022)   Received from [facility], [facility]   Overall Financial Resource Strain (CARDIA)    Difficulty of Paying Living Expenses: Not very hard  Food Insecurity: No Food Insecurity (10/12/2023)   Hunger Vital Sign    Worried About Running Out of Food in the Last Year: Never true    Ran Out of Food in the Last Year: Never true  Transportation Needs: No Transportation Needs (10/12/2023)   PRAPARE - Administrator, Civil Service (Medical): No    Lack of Transportation  (Non-Medical): No  Physical Activity: Unknown (04/11/2023)   Received from [facility], [facility]   Exercise Vital Sign    Days of Exercise per Week: Patient declined    Minutes of Exercise per Session: Not on file  Stress: Patient Declined (04/11/2023)   Received from [facility], [facility] - Occupational Stress Questionnaire    Feeling of Stress : Patient declined  Social Connections: Unknown (03/31/2022)   Received from [facility], [facility]  Social Network    Social Network: Not on file  Intimate Partner Violence: Not At Risk (10/12/2023)   Humiliation, Afraid, Rape, and Kick questionnaire    Fear of Current or Ex-Partner: No    Emotionally Abused: No    Physically Abused: No    Sexually Abused: No      Family History  Problem Relation Age of Onset   Diabetes Mother    Hypertension Brother    Diabetes Brother    Heart attack Neg Hx    Stroke Neg Hx     Vitals:   11/06/23 1009  BP: 130/70  Pulse: (!) 55  SpO2: 98%  Weight: 91.7 kg (202 lb 3.2 oz)    PHYSICAL EXAM: General:  Well appearing. Ambulated into clinic. HEENT: normal Neck: supple. JVP 7-8 cm.  Cor: PMI nondisplaced. Regular rate & rhythm. No rubs, gallops or murmurs. Lungs: clear Abdomen: obese, soft, nontender, nondistended. Good bowel sounds. Extremities: no cyanosis, clubbing, rash, 1+ pretibial edema, + TED hose. Neuro: alert & oriented x 3, . moves all 4 extremities w/o difficulty. Affect pleasant.  ECG: Sinus bradycardia 55 bpm   ASSESSMENT & PLAN: HFpEF -Echo 11/24: EF 55-60%, RV mildly reduced, RVSP 41 mmHg, dilated IVC -RHC 11/24 after diuresis: RA mean 8, PA mean 25, PCWP mean 15, LVEDP 16, Fick CO 9.35/CI 4.62 -NYHA II. Home weight up about 5 lb from discharge but volume looks good on exam. ReDS vest not available today. Continue Torsemide 40 BID for now. Cr up slightly on recent labs 11/27, has recheck planned later this week.  May eventually need to cut back Torsemide to 40 q am/ 20 q pm. -Reiterated importance of fluid/sodium restriction, weight loss, and compliance with CPAP. Multiple questions answered today. -Continue Jardiance 10 mg daily -Consider spiro next  2. CAD -S/p CABG (LIMA to LAD, free radial to LCx, SVG to AM and distal RCA) in 2003 -LHC 10/20/23: severe native 3 v CAD, patent bypass grafts -No angina -On aspirin -Goal LDL < 55, on zetia and rosuvastatin 20. -Management per Cardiology  3. Paroxysmal atrial fibrillation Bradycardia -Sinus brady today. Recently stopped Coreg. Remains on Amiodarone 100 mg daily.  -On warfarin  4. Type II Diabetes -Last A1c 6.2% -Continue Jardiance.  -Recommend he remain off Actos as it can worsen heart failure  5. AKI -Scr had been 0.8-1 last year -Scr recently 1.3-1.6 in setting of a/c CHF. Suspect he now has CKD at baseline. Scr up to 1.8 with BUN of 35 on labs at PCP's office 11/27 -Discussed rechecking labs today but he would prefer to wait until follow-up with Cardiology later this week. We discussed that his diuretic dose may need to be reduced.  6. Anemia -Hgb recently 8s-9s -Stool heme negative -T sat 13% and ferritin 68 -Received IV iron during recent admit  Referred to HFSW (PCP, Medications, Transportation, ETOH Abuse, Drug Abuse, Insurance, Financial ): No Refer to Pharmacy: No Refer to Home Health: No Refer to Advanced Heart Failure Clinic: No Refer to General Cardiology: No, already has appointment  Follow up  PRN, keep follow-up with Cardiology as scheduled 12/13.

## 2023-11-06 NOTE — Patient Instructions (Addendum)
Keep fluid intake less than 64 ounces a day (this includes all water, sodas, tea, juice, soups).   Keep salt (sodium) intake less than 1,500-2,000 mg a day.  Call your Cardiologist's office if you gain more than 3 lb in a day or 5 lb in a week. You may need to take an extra water pill.  Please call us at [phone] if any questions or concerns.

## 2023-11-07 ENCOUNTER — Encounter (HOSPITAL_COMMUNITY): Payer: Self-pay

## 2023-11-07 ENCOUNTER — Other Ambulatory Visit (HOSPITAL_COMMUNITY): Payer: PPO

## 2023-11-08 NOTE — Progress Notes (Signed)
Cardiology Office Note    Date:  11/10/2023  ID:  Fender, Stansberry [birth year], MRN [id number] PCP:  Joycelyn Rua, MD  Cardiologist:  Nanetta Batty, MD  Electrophysiologist:  Regan Lemming, MD   Chief Complaint: Hospital follow up   History of Present Illness: [name]    CORWIN JACQUES is a 72 y.o. 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  He underwent PET stress on 09/27/2023 which was low risk, small fixed mid lateral wall defect consistent with prior infarct, no ischemia, rest EF 45% improved to 55 with stress.  He was seen in clinic on 10/11/2023 for an acute visit with Dr. Gaynelle Arabian for intermittent lightheadedness, he was found to be anemic with hemoglobin 8.9. It was recommended he follow up in the ED. echocardiogram on 10/13/2023 indicated LVEF 55 to 60%, LV with normal function, no RWMA, unable to evaluate diastolic function, RV systolic function was mildly reduced, mildly enlarged and there was mildly elevated pulmonary artery systolic pressures.  Cardiac catheterization on 10/20/23 indicated widely patent grafts and no change in severe 3 V CAD since 2021.  He had mild pulmonary hypertension with mildly elevated EDP that was felt to be improving.  He was started on Jardiance and his Lasix was switched to torsemide 40 mg twice daily.  He was seen by Unice Cobble on 11/06/2023 for heart failure TOC.  He reported that he been trying to cut back on his sodium and fluid intake.  He reported that his legs with a small site been in months and his dyspnea and orthopnea had improved.  Today Mr. Almeida presents for follow-up.  He reports that he is doing very well overall.  He denies any chest pain, shortness of breath, dizziness or  lightheadedness.  He reports that his balance has been significantly better.  He has been riding his exercise bike and lifting weights without any anginal symptoms.  He notes this is the best that he has felt in 5 years.  Labwork independently reviewed: 10/25/23 Creatinine 1.80, sodium 137, potassium 3.5  ROS: .   Today he denies chest pain, shortness of breath, lower extremity edema, fatigue, palpitations, melena, hematuria, hemoptysis, diaphoresis, weakness, presyncope, syncope, orthopnea, and PND.  All other systems are reviewed and otherwise negative. Studies Reviewed: [name]    EKG:  EKG is not ordered today.   CV Studies:  Cardiac Studies & Procedures   CARDIAC CATHETERIZATION  CARDIAC CATHETERIZATION 10/20/2023  Narrative   Ost LAD to Prox LAD lesion is 100% stenosed.   Mid Cx lesion is 95% stenosed.   Mid RCA lesion is 100% stenosed.   RV Branch lesion is 99% stenosed.   2nd Mrg lesion is 100% stenosed.   Dist Cx lesion is 100% stenosed.  Right  & left Heart Catheterization + graft angiography 10/20/23: Hemodynamic data: RA 10/8, mean 8 mmHg. RV 50/2, EDP 11 mmHg PA 47/10, mean 25 mmHg.  PA saturation 72%. PW 17/27, mean 15 mmHg.  Aortic saturation 95%. C/o 9.35/CI 4.62.  QP/QS 1.00.  Findings consistent with mild pulm hypertension with elevated LVEDP.  Angiographic data: LM: Large-caliber vessel.  Mildly calcified. LAD: Occluded in the proximal segment. LCx: Severely diffusely diseased, mid segment has a 95% stenosis.  Distal circumflex is occluded.  Gives  origin to a very large OM 2 which is occluded in the ostium and also has a secondary branch. RI: Small to moderate-sized vessel, free of disease. RCA: Dominant vessel, giving origin to a very large acute marginal branch which has secondary branches and after its origin RCA is occluded.  Distal RCA has large PDA and PL branches.  SVG to RV acute marginal and distal RCA: Widely patent. Free radial graft to OM 2: Widely  patent. LIMA to LAD: Widely patent.    Impression and recommendations: Patient has mild pulm hypertension with mildly elevated EDP, appears to be getting closer to euvolemia.  Widely patent grafts and no change in coronary anatomy from 2021.  Continue medical therapy.  33 mL contrast utilized.  Findings Coronary Findings Diagnostic  Dominance: Right  Left Anterior Descending Ost LAD to Prox LAD lesion is 100% stenosed.  Left Circumflex Mid Cx lesion is 95% stenosed. Dist Cx lesion is 100% stenosed.  Second Obtuse Marginal Branch 2nd Mrg lesion is 100% stenosed.  Right Coronary Artery Mid RCA lesion is 100% stenosed.  Right Ventricular Branch RV Branch lesion is 99% stenosed.  LIMA Graft To Dist LAD  Sequential Graft To RV Branch, Dist RCA  Graft To 2nd Mrg  Intervention  No interventions have been documented.   CARDIAC CATHETERIZATION  CARDIAC CATHETERIZATION 04/23/2020  Narrative Images from the original result were not included.   Prox RCA to Mid RCA lesion is 99% stenosed.  RV Branch lesion is 99% stenosed.  Ost LAD to Prox LAD lesion is 100% stenosed.  Prox Cx lesion is 95% stenosed.  Mid Cx lesion is 95% stenosed.  JERICO CRUSE is a 72 y.o. male   [id number] LOCATION:  FACILITY: [facility] PHYSICIAN: Nanetta Batty, M.D. January 17, 1951   DATE OF PROCEDURE:  04/23/2020  DATE OF DISCHARGE:     CARDIAC CATHETERIZATION    History obtained from chart review.LE CLOUGHERTY is a 72 y.o.  moderately overweight married Caucasian male, father of 2, grandfather to 2 grandchildren who I last saw in the office 11/07/2019.[name]  He is accompanied by his wife [name] today.  He has a history of CAD status post coronary artery bypass grafting x4 in March of 2003 with a LIMA to the LAD, free radial to the circumflex marginal and sequential vein to the acute marginal of the right coronary artery and distal RCA.[name] He had recurrent pulmonary emboli thought to be secondary to  lupus anticoagulant on life-long Coumadin anticoagulation which Dr. Clarene Duke follows. His other problems include hyperlipidemia and erectile dysfunction. I catheterized him at the [facility] September 28th , 2007 revealing patent grafts with normal LV function .  This suggested that he had a false positive Myoview stress test and medical therapy was recommended.  His last Myoview performed 01/04/13 was nonischemic Dr. Izola Price follows his lipid profile. When I saw him 2 months ago he was complaining of dyspnea on exertion. He saw one of our PAs a month later with increasing dyspnea on exertion. His diastolic was cut in half but remains bradycardic although he was clinically improved. His when necessary hydrochlorothiazide was changed to when necessary Lasix which she took over the weekend for several doses which resulted in improved dyspnea and decreased edema. I did discuss with him today so restriction. A 2-D echo was ordered that showed normal FUNCTION in a Myoview stress test that showed apical thinning with new ischemia in the inferior wall compared to a previous Myoview 2 years ago. Since I  saw him 3 months ago he continued to do well.  He says he is stronger.  I did adjust his diuretics which improved his lower extremity edema.  He also admits to dietary indiscretion.  He denies chest pain does have chronic dyspnea on exertion.  He has seen Azalee Course PA-C in the office 03/29/2020...  His major complaint is  dyspnea.  He has not lost any weight.  He denies chest pain.  An echo performed 03/07/2018 was completely normal.  He has been more dyspneic and weaker since I saw him back in December.  A Myoview stress test ordered by Azalee Course 04/08/2020 showed subtle anterior ischemia only mildly different from the last Myoview performed 7/17.  However, given the fact that his last cath was 14 years ago I am going to plan on performing diagnostic coronary angiography after a Lovenox bridge.  Impression Mr. Barbot  has widely patent grafts with three-vessel native disease and normal filling pressures.  His echo revealed normal LV function.  I believe his dyspnea is multifactorial probably related to obesity and deconditioning.  His grafts are amazingly free of disease 17 years status post bypass grafting in 14 years since his last cath.  A Mynx closure device was successfully deployed.  He will be discharged home this morning as an outpatient and will restart his Lovenox bridging outpatient Coumadin therapy.  He already has a follow-up appointment scheduled.  Nanetta Batty. MD, [facility] 04/23/2020 9:07 AM  Findings Coronary Findings Diagnostic  Dominance: Right  Left Anterior Descending Ost LAD to Prox LAD lesion is 100% stenosed.  Left Circumflex Prox Cx lesion is 95% stenosed. Mid Cx lesion is 95% stenosed.  Right Coronary Artery Prox RCA to Mid RCA lesion is 99% stenosed.  Right Ventricular Branch RV Branch lesion is 99% stenosed.  LIMA Graft To Dist LAD  Graft To Dist Cx  Sequential Graft To RV Branch, Dist RCA  Intervention  No interventions have been documented.   STRESS TESTS  NM PET CT CARDIAC PERFUSION MULTI W/ABSOLUTE BLOODFLOW 09/27/2023  Narrative   Small, mild, fixed defect in the mid lateral wall consistent with prior infarction. No ischemia. MBFR not reported due to prior CABG. Low-risk study.   LV perfusion is abnormal. There is no evidence of ischemia. There is evidence of infarction. Defect 1: There is a small defect with mild reduction in uptake present in the mid lateral location(s) that is fixed. There is normal wall motion in the defect area. Consistent with infarction. The defect is consistent with abnormal perfusion in the LCx territory.   Rest left ventricular function is normal. Rest EF: 45%. Stress left ventricular function is normal. Stress EF: 55%. End diastolic cavity size is mildly enlarged.   Myocardial blood flow reserve is not reported in this patient due  to technical or patient-specific concerns that affect accuracy.   Coronary calcium assessment not performed due to prior revascularization.   Findings are consistent with infarction. The study is low risk.  Electronically signed by Lennie Odor, MD _________________________________________________________________________________________________________  CLINICAL DATA:  This over-read does not include interpretation of cardiac or coronary anatomy or pathology. The cardiac PET interpretation by the cardiologist is attached.  COMPARISON:  None Available.  FINDINGS: Small left pleural effusion lying dependently. No right pleural effusion. Cardiomegaly with atherosclerotic calcifications in the thoracic aorta as well as the left main, left anterior descending, left circumflex and right coronary arteries. Status post median sternotomy for CABG including LIMA to the LAD. Within the visualized  portions of the thorax there are no suspicious appearing pulmonary nodules or masses, there is no acute consolidative airspace disease, no pneumothorax and no lymphadenopathy. Visualized portions of the upper abdomen are unremarkable. There are no aggressive appearing lytic or blastic lesions noted in the visualized portions of the skeleton.  IMPRESSION: 1. Small left pleural effusion. 2. Cardiomegaly. 3. Atherosclerosis of the thoracic aorta as well as the left main, left anterior descending, left circumflex and right coronary arteries. Patient is status post median sternotomy for CABG including LIMA to the LAD.   Electronically Signed By: Trudie Reed M.D. On: 09/27/2023 14:10  ECHOCARDIOGRAM  ECHOCARDIOGRAM COMPLETE 10/13/2023  Narrative ECHOCARDIOGRAM REPORT    Patient Name:   DESHAN GAUDINO Date of Exam: 10/13/2023 Medical Rec #:  [id number]    Height:       66.0 in Accession #:    [id number]   Weight:       237.7 lb Date of Birth:  10-Aug-1951     BSA:          2.152 m Patient  Age:    72 years     BP:           121/59 mmHg Patient Gender: M            HR:           43 bpm. Exam Location:  Inpatient  Procedure: 2D Echo, Cardiac Doppler and Color Doppler  Indications:    Abnormal ECG  History:        Patient has prior history of Echocardiogram examinations, most recent 04/21/2020. CHF, CAD, Arrythmias:Atrial Fibrillation, Signs/Symptoms:Shortness of Breath; Risk Factors:Hypertension, Dyslipidemia, Diabetes and Obesity.  Sonographer:    Milda Smart Referring Phys: [id number] CHELSEY L ANDERSON  IMPRESSIONS   1. Left ventricular ejection fraction, by estimation, is 55 to 60%. The left ventricle has normal function. The left ventricle has no regional wall motion abnormalities. Left ventricular diastolic function could not be evaluated. 2. Right ventricular systolic function is mildly reduced. The right ventricular size is mildly enlarged. There is mildly elevated pulmonary artery systolic pressure. The estimated right ventricular systolic pressure is 41.4 mmHg. 3. The mitral valve is normal in structure. Trivial mitral valve regurgitation. No evidence of mitral stenosis. 4. The aortic valve is tricuspid. Aortic valve regurgitation is not visualized. Aortic valve sclerosis/calcification is present, without any evidence of aortic stenosis. 5. The inferior vena cava is dilated in size with <50% respiratory variability, suggesting right atrial pressure of 15 mmHg.  FINDINGS Left Ventricle: Left ventricular ejection fraction, by estimation, is 55 to 60%. The left ventricle has normal function. The left ventricle has no regional wall motion abnormalities. The left ventricular internal cavity size was normal in size. There is no left ventricular hypertrophy. Left ventricular diastolic function could not be evaluated due to atrial fibrillation. Left ventricular diastolic function could not be evaluated.  Right Ventricle: The right ventricular size is mildly enlarged. No  increase in right ventricular wall thickness. Right ventricular systolic function is mildly reduced. There is mildly elevated pulmonary artery systolic pressure. The tricuspid regurgitant velocity is 2.57 m/s, and with an assumed right atrial pressure of 15 mmHg, the estimated right ventricular systolic pressure is 41.4 mmHg.  Left Atrium: Left atrial size was normal in size.  Right Atrium: Right atrial size was normal in size.  Pericardium: There is no evidence of pericardial effusion.  Mitral Valve: The mitral valve is normal in structure. Mild mitral annular calcification. Trivial mitral  valve regurgitation. No evidence of mitral valve stenosis. MV peak gradient, 6.0 mmHg. The mean mitral valve gradient is 1.0 mmHg.  Tricuspid Valve: The tricuspid valve is normal in structure. Tricuspid valve regurgitation is mild . No evidence of tricuspid stenosis.  Aortic Valve: The aortic valve is tricuspid. Aortic valve regurgitation is not visualized. Aortic valve sclerosis/calcification is present, without any evidence of aortic stenosis.  Pulmonic Valve: The pulmonic valve was normal in structure. Pulmonic valve regurgitation is trivial. No evidence of pulmonic stenosis.  Aorta: The aortic root is normal in size and structure.  Venous: The inferior vena cava is dilated in size with less than 50% respiratory variability, suggesting right atrial pressure of 15 mmHg.  IAS/Shunts: No atrial level shunt detected by color flow Doppler.   LEFT VENTRICLE PLAX 2D LVIDd:         5.00 cm      Diastology LVIDs:         3.70 cm      LV e' medial:    5.87 cm/s LV PW:         1.00 cm      LV E/e' medial:  20.6 LV IVS:        0.70 cm      LV e' lateral:   7.40 cm/s LVOT diam:     2.00 cm      LV E/e' lateral: 16.4 LV SV:         86 LV SV Index:   40 LVOT Area:     3.14 cm  LV Volumes (MOD) LV vol d, MOD A2C: 130.0 ml LV vol d, MOD A4C: 128.0 ml LV vol s, MOD A2C: 42.6 ml LV vol s, MOD A4C: 45.3  ml LV SV MOD A2C:     87.4 ml LV SV MOD A4C:     128.0 ml LV SV MOD BP:      86.2 ml  RIGHT VENTRICLE RV Basal diam:  4.20 cm RV S prime:     10.80 cm/s TAPSE (M-mode): 1.4 cm  LEFT ATRIUM             Index        RIGHT ATRIUM           Index LA diam:        3.20 cm 1.49 cm/m   RA Area:     21.60 cm LA Vol (A2C):   63.2 ml 29.37 ml/m  RA Volume:   60.10 ml  27.93 ml/m LA Vol (A4C):   64.8 ml 30.11 ml/m LA Biplane Vol: 69.6 ml 32.34 ml/m AORTIC VALVE LVOT Vmax:   112.00 cm/s LVOT Vmean:  74.700 cm/s LVOT VTI:    0.273 m  AORTA Ao Root diam: 3.30 cm Ao Asc diam:  3.60 cm  MITRAL VALVE                TRICUSPID VALVE MV Area (PHT): 5.20 cm     TR Peak grad:   26.4 mmHg MV Area VTI:   1.92 cm     TR Mean grad:   21.0 mmHg MV Peak grad:  6.0 mmHg     TR Vmax:        257.00 cm/s MV Mean grad:  1.0 mmHg     TR Vmean:       220.0 cm/s MV Vmax:       1.22 m/s MV Vmean:      53.0 cm/s    SHUNTS MV Decel Time:  146 msec     Systemic VTI:  0.27 m MR Peak grad: 43.7 mmHg     Systemic Diam: 2.00 cm MR Vmax:      330.67 cm/s MV E velocity: 121.00 cm/s  Armanda Magic MD Electronically signed by Armanda Magic MD Signature Date/Time: 10/13/2023/3:26:11 PM    Final   MONITORS  LONG TERM MONITOR (3-14 DAYS) 11/18/2020  Narrative 1. SR/SB/ST 2. Occasional PACs and PVCs 3. Short runs of SVT and NSVT 4. PAF with slow and RVR 4. Needs ROV to discuss             Current Reported Medications:.    Current Meds  Medication Sig   acetaminophen (TYLENOL) 500 MG tablet Take 1,000-1,500 mg by mouth every 6 (six) hours as needed for moderate pain.   ALPRAZolam (XANAX) 0.5 MG tablet Take 0.25-0.5 mg by mouth at bedtime.   amiodarone (PACERONE) 200 MG tablet Take 0.5 tablets (100 mg total) by mouth daily.   aspirin EC 81 MG tablet Take 81 mg by mouth daily.   cyanocobalamin (VITAMIN B12) 1000 MCG tablet Take 1 tablet (1,000 mcg total) by mouth daily.   Elastic Bandages & Supports  (MEDICAL COMPRESSION STOCKINGS) MISC Knee High 20-30 mm compression stockings   ezetimibe (ZETIA) 10 MG tablet Take 1 tablet (10 mg total) by mouth daily.   folic acid (FOLVITE) 1 MG tablet Take 1 mg by mouth daily.   Insulin Pen Needle (BD PEN NEEDLE NANO 2ND GEN) 32G X 4 MM MISC BS BID   metFORMIN (GLUCOPHAGE) 1000 MG tablet Take 500 mg by mouth 2 (two) times daily with a meal.   Misc Natural Products (IMMUNE FORMULA PO) Take 1 each by mouth daily. Immune: Vitamin C, D and Zinc. Chew 1 tablets daily.   omeprazole (PRILOSEC) 40 MG capsule TAKE ONE CAPSULE BY MOUTH DAILY AS NEEDED FOR HEARTBURN   rosuvastatin (CRESTOR) 20 MG tablet TAKE 1 TABLET(20 MG) BY MOUTH DAILY   Vitamin D, Ergocalciferol, (DRISDOL) 1.25 MG (50000 UNIT) CAPS capsule Take 1 capsule (50,000 Units total) by mouth every 7 (seven) days.   warfarin (COUMADIN) 1 MG tablet Take 1 mg by mouth See admin instructions. Alternate 1mg  with 2mg  dose on a schedule of once daily 2mg , 2mg , 1mg  and repeat pattern.   warfarin (COUMADIN) 2 MG tablet Take 2 mg by mouth See admin instructions. Alternate 1mg  with 2mg  dose on a schedule of once daily 2mg , 2mg , 1mg  and repeat pattern.   [DISCONTINUED] empagliflozin (JARDIANCE) 10 MG TABS tablet Take 1 tablet (10 mg total) by mouth daily.   [DISCONTINUED] torsemide (DEMADEX) 20 MG tablet Take 2 tablets (40 mg total) by mouth 2 (two) times daily.   Physical Exam:    VS:  BP 110/80   Pulse (!) 52   Ht 5\' 6"  (1.676 m)   Wt 195 lb (88.5 kg)   SpO2 96%   BMI 31.47 kg/m    Wt Readings from Last 3 Encounters:  11/10/23 195 lb (88.5 kg)  11/06/23 202 lb 3.2 oz (91.7 kg)  10/21/23 198 lb 3.2 oz (89.9 kg)    GEN: Well nourished, well developed in no acute distress NECK: No JVD; No carotid bruits CARDIAC: RRR, no murmurs, rubs, gallops RESPIRATORY:  Clear to auscultation without rales, wheezing or rhonchi  ABDOMEN: Soft, non-tender, non-distended EXTREMITIES:  No edema; No acute deformity    Asessement and Plan:.    HFpEF: Echo 11/24 indicated LVEF 55 to 60%, RV mildly reduced, RVSP 41  mmHg, dilated IVC.  RHC on 11/24 after diuresis: RA mean 8 PA mean 25, PCWP mean 15, LVEDP 16, Fick CO 9.35/CO 4.62. Today he appears euvolemic and well compensated on exam.  His weight is back down to his discharge weight.  He denies any shortness of breath, he has no lower extremity edema.  He reports is the best he has felt in years.  Of note patient has only been taking torsemide 20 mg in the morning and 20 mg in the afternoon, for a total of 40 mg daily, we will continue this as he is euvolemic. Last creatinine 1.8 on 10/25/2023. Reviewed ED precautions.  Discussed with patient to notify the office of weight gain of 2 to 3 pounds overnight or 5 pounds in a week.  He continues to work on low-sodium diet.  Will not start spironolactone at this time given elevations in creatinine and blood pressure. Continue Jardiance 10 mg daily and torsemide 40 mg daily.  CAD: S/p CABG with LIMA to LAD, free radial to LCx, SVG to a.m. and distal RCA in 2003.  LHC on 10/20/2023 with severe native three-vessel CAD with patent bypass grafts. Stable with no anginal symptoms. No indication for ischemic evaluation.  Right femoral cath site clean and intact no evidence of hematoma. Heart healthy diet and regular cardiovascular exercise encouraged. Continue aspirin 81 mg daily, Zetia 10 mg daily, Crestor 20 mg daily, torsemide 40 mg daily. Check BMET today.   Bradycardia/Persistent atrial fibrillation: Coreg discontinued given sinus bradycardia during admission, heart rate in the 40's at admission.  On amiodarone 100 mg daily. Today he denies palpitations or irregular heart beats, his dizziness has resolved.  He denies any presyncope or syncope.  Continue amiodarone 100 mg daily. On coumadin per coumadin clinic. Last TSH 1.807 on 10/12/2023. AST 54 and ALT 46 on 10/18/23.   Anemia: Noted to have hemoglobin of 8.3 on admission in  09/2023.  There were no obvious signs of blood loss, stool heme was negative on presentation, B12 was low at 169.  He plans to follow-up with his PCP for ongoing monitoring.  Hyperlipidemia: Last lipid profile on 08/01/2023 revealed total cholesterol 82, HDL 34 and LDL of 36.  Continue Zetia and Crestor.  OSA: On CPAP followed by Dr. Tresa Endo. Reports compliance.     Disposition: F/u with Reather Littler, NP in 4-6 weeks.   Signed, Rip Harbour, NP

## 2023-11-10 ENCOUNTER — Ambulatory Visit: Payer: PPO | Attending: Cardiology | Admitting: Cardiology

## 2023-11-10 ENCOUNTER — Encounter: Payer: Self-pay | Admitting: Cardiology

## 2023-11-10 ENCOUNTER — Other Ambulatory Visit: Payer: Self-pay | Admitting: Cardiovascular Disease

## 2023-11-10 VITALS — BP 110/80 | HR 52 | Ht 66.0 in | Wt 195.0 lb

## 2023-11-10 DIAGNOSIS — E1151 Type 2 diabetes mellitus with diabetic peripheral angiopathy without gangrene: Secondary | ICD-10-CM | POA: Diagnosis not present

## 2023-11-10 DIAGNOSIS — B351 Tinea unguium: Secondary | ICD-10-CM | POA: Diagnosis not present

## 2023-11-10 DIAGNOSIS — R001 Bradycardia, unspecified: Secondary | ICD-10-CM

## 2023-11-10 DIAGNOSIS — I2510 Atherosclerotic heart disease of native coronary artery without angina pectoris: Secondary | ICD-10-CM

## 2023-11-10 DIAGNOSIS — I480 Paroxysmal atrial fibrillation: Secondary | ICD-10-CM

## 2023-11-10 DIAGNOSIS — I5032 Chronic diastolic (congestive) heart failure: Secondary | ICD-10-CM

## 2023-11-10 DIAGNOSIS — E1142 Type 2 diabetes mellitus with diabetic polyneuropathy: Secondary | ICD-10-CM | POA: Diagnosis not present

## 2023-11-10 DIAGNOSIS — N179 Acute kidney failure, unspecified: Secondary | ICD-10-CM

## 2023-11-10 DIAGNOSIS — I10 Essential (primary) hypertension: Secondary | ICD-10-CM | POA: Diagnosis not present

## 2023-11-10 DIAGNOSIS — M2041 Other hammer toe(s) (acquired), right foot: Secondary | ICD-10-CM | POA: Diagnosis not present

## 2023-11-10 DIAGNOSIS — L600 Ingrowing nail: Secondary | ICD-10-CM | POA: Diagnosis not present

## 2023-11-10 DIAGNOSIS — M2042 Other hammer toe(s) (acquired), left foot: Secondary | ICD-10-CM | POA: Diagnosis not present

## 2023-11-10 DIAGNOSIS — L03031 Cellulitis of right toe: Secondary | ICD-10-CM | POA: Diagnosis not present

## 2023-11-10 MED ORDER — EMPAGLIFLOZIN 10 MG PO TABS: 10.0000 mg | ORAL_TABLET | Freq: Every day | ORAL | 2 refills | Status: DC

## 2023-11-10 MED ORDER — TORSEMIDE 20 MG PO TABS: 20.0000 mg | ORAL_TABLET | Freq: Two times a day (BID) | ORAL | 0 refills | Status: DC

## 2023-11-10 NOTE — Patient Instructions (Signed)
Medication Instructions:  Continue taking Torsemide 20 mg in the morning and 20 mg in the afternoon *If you need a refill on your cardiac medications before your next appointment, please call your pharmacy*  Lab Work: Today we will draw the BMP from Dr. Allyson Sabal If you have labs (blood work) drawn today and your tests are completely normal, you will receive your results only by: MyChart Message (if you have MyChart) OR A paper copy in the mail If you have any lab test that is abnormal or we need to change your treatment, we will call you to review the results.  Testing/Procedures: No testing  Follow-Up: At [facility], you and your health needs are our priority.  As part of our continuing mission to provide you with exceptional heart care, we have created designated Provider Care Teams.  These Care Teams include your primary Cardiologist (physician) and Advanced Practice Providers (APPs -  Physician Assistants and Nurse Practitioners) who all work together to provide you with the care you need, when you need it.  We recommend signing up for the patient portal called "MyChart".  Sign up information is provided on this After Visit Summary.  MyChart is used to connect with patients for Virtual Visits (Telemedicine).  Patients are able to view lab/test results, encounter notes, upcoming appointments, etc.  Non-urgent messages can be sent to your provider as well.   To learn more about what you can do with MyChart, go to [email or web address].    Your next appointment:   6 week(s)  Provider:   Reather Littler, NP

## 2023-11-11 LAB — BASIC METABOLIC PANEL
BUN/Creatinine Ratio: 15 (ref 10–24)
BUN: 22 mg/dL (ref 8–27)
CO2: 31 mmol/L — ABNORMAL HIGH (ref 20–29)
Calcium: 9.2 mg/dL (ref 8.6–10.2)
Chloride: 100 mmol/L (ref 96–106)
Creatinine, Ser: 1.5 mg/dL — ABNORMAL HIGH (ref 0.76–1.27)
Glucose: 91 mg/dL (ref 70–99)
Potassium: 4.1 mmol/L (ref 3.5–5.2)
Sodium: 143 mmol/L (ref 134–144)
eGFR: 49 mL/min/{1.73_m2} — ABNORMAL LOW (ref >59)

## 2023-11-14 ENCOUNTER — Encounter: Payer: Self-pay | Admitting: Bariatrics

## 2023-11-14 ENCOUNTER — Ambulatory Visit (INDEPENDENT_AMBULATORY_CARE_PROVIDER_SITE_OTHER): Payer: PPO | Admitting: Bariatrics

## 2023-11-14 VITALS — BP 126/70 | HR 54 | Temp 97.4°F | Ht 66.0 in | Wt 190.0 lb

## 2023-11-14 DIAGNOSIS — R632 Polyphagia: Secondary | ICD-10-CM

## 2023-11-14 DIAGNOSIS — Z6830 Body mass index (BMI) 30.0-30.9, adult: Secondary | ICD-10-CM

## 2023-11-14 DIAGNOSIS — E669 Obesity, unspecified: Secondary | ICD-10-CM

## 2023-11-14 DIAGNOSIS — I509 Heart failure, unspecified: Secondary | ICD-10-CM | POA: Diagnosis not present

## 2023-11-14 NOTE — Progress Notes (Signed)
WEIGHT SUMMARY AND BIOMETRICS  Weight Lost Since Last Visit: 43lb  Weight Gained Since Last Visit: 0   Vitals Temp: (!) 97.4 F (36.3 C) BP: 126/70 Pulse Rate: (!) 54 SpO2: 98 %   Anthropometric Measurements Height: 5\' 6"  (1.676 m) Weight: 190 lb (86.2 kg) BMI (Calculated): 30.68 Weight at Last Visit: 233lb Weight Lost Since Last Visit: 43lb Weight Gained Since Last Visit: 0 Starting Weight: 253lb Total Weight Loss (lbs): 63 lb (28.6 kg)   Body Composition  Body Fat %: 28.1 % Fat Mass (lbs): 53.6 lbs Muscle Mass (lbs): 130 lbs Total Body Water (lbs): 105 lbs Visceral Fat Rating : 17   Other Clinical Data Fasting: no Labs: no Today's Visit #: 15 Starting Date: 10/24/22    OBESITY Eduardo Butler is here to discuss his progress with his obesity treatment plan along with follow-up of his obesity related diagnoses.    Nutrition Plan: the Category 3 plan - 50% adherence.  Current exercise: bicycling and weightlifting  Interim History:  He is 43 lbs since his last visit.  Eating all of the food on the plan., Protein intake is as prescribed, Is not skipping meals, and Water intake is adequate.   Pharmacotherapy: Eduardo Butler is on Jardiance 10 mg daily. HE stopped the Ozempic Adverse side effects: None Hunger is moderately controlled.  Cravings are moderately controlled.  Assessment/Plan:   Eduardo Butler endorses hunger, but has subsided recently.,  Medication(s): none Effects of medication:  moderately controlled. Cravings are well controlled.   Plan: Medication(s): Jardiance 10 mg daily.  Will increase water, protein and fiber to help assuage hunger.  Will minimize foods that have a high glucose index/load to minimize reactive hypoglycemia.    Chronic CHF :   He was recently admitted to the hospital for symptomatic anemia and congestive heart failure.   See our last visit where he was told to follow-up with his cardiologist immediately which he did the next day where testing was done and then he was admitted on 10/12/2023.  At his hospital visit, his Ozempic was stopped and he was started on Jardiance.   Plan: He will follow-up with his primary care on Thursday and with his cardiologist on 12/21/2023.  He will watch his weight closely and if he gains weight to rapidly he will call his cardiologist.  He will keep all fluids to 64 ounces.  He will minimize his salt and keep it at or below 1500 mg.  Will not resume Ozempic at this time, he will continue his Jardiance and other medications per her cardiologist.  He will eat sensibly and increase his protein.  Reviewed his meals in detail and talked about alternatives for protein and vegetables.    Generalized Obesity: Current BMI BMI (Calculated): 30.68    Eduardo Butler is currently in the action stage of change. As such, his goal is to continue with  weight loss efforts.  He has agreed to the Category 3 plan.No added salt.   Exercise goals: Older adults should determine their level of effort for physical activity relative to their level of fitness.  He is doing more exercise both cardio and resistance per his cardiologist's recommendation.   Behavioral modification strategies: increasing lean protein intake, decreasing simple carbohydrates , no meal skipping, decrease eating out, meal planning , increase water intake, better snacking choices, planning for success, increasing fiber rich foods, decreasing sodium intake, and get rid of junk food in the home.  Eduardo Butler has agreed to follow-up with our clinic in 6 weeks.      Objective:   VITALS: Per patient if applicable, see vitals. GENERAL: Alert and in no acute distress. CARDIOPULMONARY: No increased WOB. Speaking in clear sentences.  PSYCH: Pleasant and cooperative. Speech normal rate and rhythm. Affect is appropriate. Insight and judgement are  appropriate. Attention is focused, linear, and appropriate.  NEURO: Oriented as arrived to appointment on time with no prompting.   Attestation Statements:   * Time spent on visit including the items listed below was 32 minutes.  -preparing to see the patient (e.g., review of tests, history, previous notes) -obtaining and/or reviewing separately obtained history -counseling and educating the patient/family/caregiver -documenting clinical information in the electronic or other health record -ordering medications, tests, or procedures -independently interpreting results and communicating results to the patient/ family/caregiver -referring and communicating with other health care professionals  -care coordination    This was prepared with the assistance of Engineer, civil (consulting).  Occasional wrong-word or sound-a-like substitutions may have occurred due to the inherent limitations of voice recognition   Corinna Capra, DO

## 2023-11-16 DIAGNOSIS — Z7901 Long term (current) use of anticoagulants: Secondary | ICD-10-CM | POA: Diagnosis not present

## 2023-11-16 DIAGNOSIS — I4891 Unspecified atrial fibrillation: Secondary | ICD-10-CM | POA: Diagnosis not present

## 2023-11-16 DIAGNOSIS — Z86718 Personal history of other venous thrombosis and embolism: Secondary | ICD-10-CM | POA: Diagnosis not present

## 2023-11-24 DIAGNOSIS — Z86718 Personal history of other venous thrombosis and embolism: Secondary | ICD-10-CM | POA: Diagnosis not present

## 2023-11-24 DIAGNOSIS — D6869 Other thrombophilia: Secondary | ICD-10-CM | POA: Diagnosis not present

## 2023-12-21 ENCOUNTER — Encounter: Payer: Self-pay | Admitting: Cardiology

## 2023-12-21 ENCOUNTER — Ambulatory Visit: Payer: PPO | Attending: Cardiology | Admitting: Cardiology

## 2023-12-21 VITALS — BP 124/68 | HR 61 | Ht 66.0 in | Wt 202.2 lb

## 2023-12-21 DIAGNOSIS — R001 Bradycardia, unspecified: Secondary | ICD-10-CM

## 2023-12-21 DIAGNOSIS — I480 Paroxysmal atrial fibrillation: Secondary | ICD-10-CM

## 2023-12-21 DIAGNOSIS — I5032 Chronic diastolic (congestive) heart failure: Secondary | ICD-10-CM

## 2023-12-21 DIAGNOSIS — E785 Hyperlipidemia, unspecified: Secondary | ICD-10-CM

## 2023-12-21 DIAGNOSIS — I2510 Atherosclerotic heart disease of native coronary artery without angina pectoris: Secondary | ICD-10-CM | POA: Diagnosis not present

## 2023-12-21 DIAGNOSIS — I10 Essential (primary) hypertension: Secondary | ICD-10-CM

## 2023-12-21 NOTE — Progress Notes (Addendum)
Cardiology Office Note    Date:  12/21/2023  ID:  Eduardo, Butler 08-29-1951, MRN [id number] PCP:  Joycelyn Rua, MD  Cardiologist:  Nanetta Batty, MD  Electrophysiologist:  Regan Lemming, MD   Chief Complaint: Follow up   History of Present Illness: Eduardo Butler    Eduardo Butler is a 73 y.o. 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  On 09/27/2023 he underwent PET stress which was low risk, small fixed mid lateral wall defect consistent with prior infarct, no ischemia, rest EF 45% improved to 55% with stress.  He was seen in clinic on 10/11/2023 for acute visit with Dr. Gaynelle Arabian for intermittent lightheadedness, he was found to be anemic with hemoglobin 8.9.  Was recommended he follow-up in the ED.  Echocardiogram on 10/13/2023 indicated LVEF of 55 to 60%, LV with normal function, no RWMA, unable to evaluate diastolic function, RV systolic function was mildly reduced, mildly enlarged and there was mildly elevated pulmonary artery systolic pressures.  Cardiac catheterization on 10/20/2023 indicated widely patent grafts and no change in severe three-vessel CAD since 2021.  He had mild pulmonary hypertension with mildly elevated EDP that was felt to be improving.  He was started on Jardiance and his Lasix was changed to torsemide 40 mg twice daily.  He was seen for heart failure TOC on 11/06/2023.  He reported that he had been trying to reduce his sodium and fluid intake.  He reported that his legs had not been not similar in months and that his dyspnea and orthopnea had improved.  He was last seen in clinic on 11/10/2023, he reported that he been doing very well.  He reported that he been out riding his exercise bike and lifting weights  without any anginal symptoms.  Today he presents for follow up. He reports that he feels great, he denies chest pain, shortness of breath, denies lower extremity edema. He continues to exercise on his stationary bike, tolerating well.  He reports that his weights have been stable at home ranging from 193-195 lbs.  ROS: .   Today he denies chest pain, shortness of breath, lower extremity edema, fatigue, palpitations, melena, hematuria, hemoptysis, diaphoresis, weakness, presyncope, syncope, orthopnea, and PND.  All other systems are reviewed and otherwise negative. Studies Reviewed: Eduardo Butler    EKG:  EKG is not ordered today.   CV Studies:  Cardiac Studies & Procedures   CARDIAC CATHETERIZATION  CARDIAC CATHETERIZATION 10/20/2023  Narrative   Ost LAD to Prox LAD lesion is 100% stenosed.   Mid Cx lesion is 95% stenosed.   Mid RCA lesion is 100% stenosed.   RV Branch lesion is 99% stenosed.   2nd Mrg lesion is 100% stenosed.   Dist Cx lesion is 100% stenosed.  Right  & left Heart Catheterization + graft angiography 10/20/23: Hemodynamic data: RA 10/8, mean 8 mmHg. RV 50/2, EDP 11 mmHg PA 47/10, mean 25 mmHg.  PA saturation 72%. PW 17/27, mean 15 mmHg.  Aortic saturation 95%. C/o 9.35/CI 4.62.  QP/QS 1.00.  Findings consistent with mild pulm hypertension with elevated LVEDP.  Angiographic data: LM: Large-caliber vessel.  Mildly calcified. LAD: Occluded in the proximal segment. LCx: Severely diffusely diseased, mid segment has a 95% stenosis.  Distal circumflex is occluded.  Gives origin to a very large OM 2 which is occluded in the ostium and also has a secondary branch. RI: Small to moderate-sized vessel, free of disease. RCA: Dominant vessel, giving origin to a very large acute marginal branch which has secondary branches and after its origin RCA is occluded.  Distal RCA has large PDA and PL branches.  SVG to RV acute marginal and distal RCA: Widely patent. Free radial graft to OM 2:  Widely patent. LIMA to LAD: Widely patent.    Impression and recommendations: Patient has mild pulm hypertension with mildly elevated EDP, appears to be getting closer to euvolemia.  Widely patent grafts and no change in coronary anatomy from 2021.  Continue medical therapy.  33 mL contrast utilized.  Findings Coronary Findings Diagnostic  Dominance: Right  Left Anterior Descending Ost LAD to Prox LAD lesion is 100% stenosed.  Left Circumflex Mid Cx lesion is 95% stenosed. Dist Cx lesion is 100% stenosed.  Second Obtuse Marginal Branch 2nd Mrg lesion is 100% stenosed.  Right Coronary Artery Mid RCA lesion is 100% stenosed.  Right Ventricular Branch RV Branch lesion is 99% stenosed.  LIMA Graft To Dist LAD  Sequential Graft To RV Branch, Dist RCA  Graft To 2nd Mrg  Intervention  No interventions have been documented.   CARDIAC CATHETERIZATION  CARDIAC CATHETERIZATION 04/23/2020  Narrative Images from the original result were not included.   Prox RCA to Mid RCA lesion is 99% stenosed.  RV Branch lesion is 99% stenosed.  Ost LAD to Prox LAD lesion is 100% stenosed.  Prox Cx lesion is 95% stenosed.  Mid Cx lesion is 95% stenosed.  Eduardo Butler is a 73 y.o. male   [id number] LOCATION:  FACILITY: [facility] PHYSICIAN: Nanetta Batty, M.D. [birth year]   DATE OF PROCEDURE:  04/23/2020  DATE OF DISCHARGE:     CARDIAC CATHETERIZATION    History obtained from chart review.Eduardo Butler is a 73 y.o.  moderately overweight married Caucasian male, father of 2, grandfather to 2 grandchildren who I last saw in the office 11/07/2019.Eduardo Butler  He is accompanied by his wife [name] today.  He has a history of CAD status post coronary artery bypass grafting x4 in March of 2003 with a LIMA to the LAD, free radial to the circumflex marginal and sequential vein to the acute marginal of the right coronary artery and distal RCA.Eduardo Butler He had recurrent pulmonary emboli thought to be  secondary to lupus anticoagulant on life-long Coumadin anticoagulation which Dr. Clarene Duke follows. His other problems include hyperlipidemia and erectile dysfunction. I catheterized him at the [facility] September 28th , 2007 revealing patent grafts with normal LV function .  This suggested that he had a false positive Myoview stress test and medical therapy was recommended.  His last Myoview performed 01/04/13 was nonischemic Dr. Izola Price follows his lipid profile. When I saw him 2 months ago he was complaining of dyspnea on exertion. He saw one of our PAs a month later with increasing dyspnea on exertion. His diastolic was cut in half but remains bradycardic although he was clinically improved. His when necessary hydrochlorothiazide was changed to when necessary Lasix which she took over the weekend for several doses which resulted in improved dyspnea and decreased edema. I did discuss with him today so restriction. A 2-D echo was ordered that showed normal FUNCTION in a Myoview stress test that showed apical thinning with new ischemia in the inferior wall compared to a previous Myoview 2 years ago. Since  I saw him 3 months ago he continued to do well.  He says he is stronger.  I did adjust his diuretics which improved his lower extremity edema.  He also admits to dietary indiscretion.  He denies chest pain does have chronic dyspnea on exertion.  He has seen Azalee Course PA-C in the office 03/29/2020...  His major complaint is  dyspnea.  He has not lost any weight.  He denies chest pain.  An echo performed 03/07/2018 was completely normal.  He has been more dyspneic and weaker since I saw him back in December.  A Myoview stress test ordered by Azalee Course 04/08/2020 showed subtle anterior ischemia only mildly different from the last Myoview performed 7/17.  However, given the fact that his last cath was 14 years ago I am going to plan on performing diagnostic coronary angiography after a Lovenox  bridge.  Impression Mr. Livecchi has widely patent grafts with three-vessel native disease and normal filling pressures.  His echo revealed normal LV function.  I believe his dyspnea is multifactorial probably related to obesity and deconditioning.  His grafts are amazingly free of disease 17 years status post bypass grafting in 14 years since his last cath.  A Mynx closure device was successfully deployed.  He will be discharged home this morning as an outpatient and will restart his Lovenox bridging outpatient Coumadin therapy.  He already has a follow-up appointment scheduled.  Nanetta Batty. MD, [facility] 04/23/2020 9:07 AM  Findings Coronary Findings Diagnostic  Dominance: Right  Left Anterior Descending Ost LAD to Prox LAD lesion is 100% stenosed.  Left Circumflex Prox Cx lesion is 95% stenosed. Mid Cx lesion is 95% stenosed.  Right Coronary Artery Prox RCA to Mid RCA lesion is 99% stenosed.  Right Ventricular Branch RV Branch lesion is 99% stenosed.  LIMA Graft To Dist LAD  Graft To Dist Cx  Sequential Graft To RV Branch, Dist RCA  Intervention  No interventions have been documented.   STRESS TESTS  NM PET CT CARDIAC PERFUSION MULTI W/ABSOLUTE BLOODFLOW 09/27/2023  Narrative   Small, mild, fixed defect in the mid lateral wall consistent with prior infarction. No ischemia. MBFR not reported due to prior CABG. Low-risk study.   LV perfusion is abnormal. There is no evidence of ischemia. There is evidence of infarction. Defect 1: There is a small defect with mild reduction in uptake present in the mid lateral location(s) that is fixed. There is normal wall motion in the defect area. Consistent with infarction. The defect is consistent with abnormal perfusion in the LCx territory.   Rest left ventricular function is normal. Rest EF: 45%. Stress left ventricular function is normal. Stress EF: 55%. End diastolic cavity size is mildly enlarged.   Myocardial blood flow reserve is  not reported in this patient due to technical or patient-specific concerns that affect accuracy.   Coronary calcium assessment not performed due to prior revascularization.   Findings are consistent with infarction. The study is low risk.  Electronically signed by Lennie Odor, MD _________________________________________________________________________________________________________  CLINICAL DATA:  This over-read does not include interpretation of cardiac or coronary anatomy or pathology. The cardiac PET interpretation by the cardiologist is attached.  COMPARISON:  None Available.  FINDINGS: Small left pleural effusion lying dependently. No right pleural effusion. Cardiomegaly with atherosclerotic calcifications in the thoracic aorta as well as the left main, left anterior descending, left circumflex and right coronary arteries. Status post median sternotomy for CABG including LIMA to the LAD. Within the  visualized portions of the thorax there are no suspicious appearing pulmonary nodules or masses, there is no acute consolidative airspace disease, no pneumothorax and no lymphadenopathy. Visualized portions of the upper abdomen are unremarkable. There are no aggressive appearing lytic or blastic lesions noted in the visualized portions of the skeleton.  IMPRESSION: 1. Small left pleural effusion. 2. Cardiomegaly. 3. Atherosclerosis of the thoracic aorta as well as the left main, left anterior descending, left circumflex and right coronary arteries. Patient is status post median sternotomy for CABG including LIMA to the LAD.   Electronically Signed By: Trudie Reed M.D. On: 09/27/2023 14:10  ECHOCARDIOGRAM  ECHOCARDIOGRAM COMPLETE 10/13/2023  Narrative ECHOCARDIOGRAM REPORT    Patient Name:   BRIGHTEN ORNDOFF Date of Exam: 10/13/2023 Medical Rec #:  [id number]    Height:       66.0 in Accession #:    [id number]   Weight:       237.7 lb Date of Birth:  Feb 14, 1951      BSA:          2.152 m Patient Age:    72 years     BP:           121/59 mmHg Patient Gender: M            HR:           43 bpm. Exam Location:  Inpatient  Procedure: 2D Echo, Cardiac Doppler and Color Doppler  Indications:    Abnormal ECG  History:        Patient has prior history of Echocardiogram examinations, most recent 04/21/2020. CHF, CAD, Arrythmias:Atrial Fibrillation, Signs/Symptoms:Shortness of Breath; Risk Factors:Hypertension, Dyslipidemia, Diabetes and Obesity.  Sonographer:    Milda Smart Referring Phys: [id number] CHELSEY L ANDERSON  IMPRESSIONS   1. Left ventricular ejection fraction, by estimation, is 55 to 60%. The left ventricle has normal function. The left ventricle has no regional wall motion abnormalities. Left ventricular diastolic function could not be evaluated. 2. Right ventricular systolic function is mildly reduced. The right ventricular size is mildly enlarged. There is mildly elevated pulmonary artery systolic pressure. The estimated right ventricular systolic pressure is 41.4 mmHg. 3. The mitral valve is normal in structure. Trivial mitral valve regurgitation. No evidence of mitral stenosis. 4. The aortic valve is tricuspid. Aortic valve regurgitation is not visualized. Aortic valve sclerosis/calcification is present, without any evidence of aortic stenosis. 5. The inferior vena cava is dilated in size with <50% respiratory variability, suggesting right atrial pressure of 15 mmHg.  FINDINGS Left Ventricle: Left ventricular ejection fraction, by estimation, is 55 to 60%. The left ventricle has normal function. The left ventricle has no regional wall motion abnormalities. The left ventricular internal cavity size was normal in size. There is no left ventricular hypertrophy. Left ventricular diastolic function could not be evaluated due to atrial fibrillation. Left ventricular diastolic function could not be evaluated.  Right Ventricle: The right  ventricular size is mildly enlarged. No increase in right ventricular wall thickness. Right ventricular systolic function is mildly reduced. There is mildly elevated pulmonary artery systolic pressure. The tricuspid regurgitant velocity is 2.57 m/s, and with an assumed right atrial pressure of 15 mmHg, the estimated right ventricular systolic pressure is 41.4 mmHg.  Left Atrium: Left atrial size was normal in size.  Right Atrium: Right atrial size was normal in size.  Pericardium: There is no evidence of pericardial effusion.  Mitral Valve: The mitral valve is normal in structure. Mild mitral annular calcification. Trivial  mitral valve regurgitation. No evidence of mitral valve stenosis. MV peak gradient, 6.0 mmHg. The mean mitral valve gradient is 1.0 mmHg.  Tricuspid Valve: The tricuspid valve is normal in structure. Tricuspid valve regurgitation is mild . No evidence of tricuspid stenosis.  Aortic Valve: The aortic valve is tricuspid. Aortic valve regurgitation is not visualized. Aortic valve sclerosis/calcification is present, without any evidence of aortic stenosis.  Pulmonic Valve: The pulmonic valve was normal in structure. Pulmonic valve regurgitation is trivial. No evidence of pulmonic stenosis.  Aorta: The aortic root is normal in size and structure.  Venous: The inferior vena cava is dilated in size with less than 50% respiratory variability, suggesting right atrial pressure of 15 mmHg.  IAS/Shunts: No atrial level shunt detected by color flow Doppler.   LEFT VENTRICLE PLAX 2D LVIDd:         5.00 cm      Diastology LVIDs:         3.70 cm      LV e' medial:    5.87 cm/s LV PW:         1.00 cm      LV E/e' medial:  20.6 LV IVS:        0.70 cm      LV e' lateral:   7.40 cm/s LVOT diam:     2.00 cm      LV E/e' lateral: 16.4 LV SV:         86 LV SV Index:   40 LVOT Area:     3.14 cm  LV Volumes (MOD) LV vol d, MOD A2C: 130.0 ml LV vol d, MOD A4C: 128.0 ml LV vol s, MOD  A2C: 42.6 ml LV vol s, MOD A4C: 45.3 ml LV SV MOD A2C:     87.4 ml LV SV MOD A4C:     128.0 ml LV SV MOD BP:      86.2 ml  RIGHT VENTRICLE RV Basal diam:  4.20 cm RV S prime:     10.80 cm/s TAPSE (M-mode): 1.4 cm  LEFT ATRIUM             Index        RIGHT ATRIUM           Index LA diam:        3.20 cm 1.49 cm/m   RA Area:     21.60 cm LA Vol (A2C):   63.2 ml 29.37 ml/m  RA Volume:   60.10 ml  27.93 ml/m LA Vol (A4C):   64.8 ml 30.11 ml/m LA Biplane Vol: 69.6 ml 32.34 ml/m AORTIC VALVE LVOT Vmax:   112.00 cm/s LVOT Vmean:  74.700 cm/s LVOT VTI:    0.273 m  AORTA Ao Root diam: 3.30 cm Ao Asc diam:  3.60 cm  MITRAL VALVE                TRICUSPID VALVE MV Area (PHT): 5.20 cm     TR Peak grad:   26.4 mmHg MV Area VTI:   1.92 cm     TR Mean grad:   21.0 mmHg MV Peak grad:  6.0 mmHg     TR Vmax:        257.00 cm/s MV Mean grad:  1.0 mmHg     TR Vmean:       220.0 cm/s MV Vmax:       1.22 m/s MV Vmean:      53.0 cm/s    SHUNTS MV Decel  Time: 146 msec     Systemic VTI:  0.27 m MR Peak grad: 43.7 mmHg     Systemic Diam: 2.00 cm MR Vmax:      330.67 cm/s MV E velocity: 121.00 cm/s  Armanda Magic MD Electronically signed by Armanda Magic MD Signature Date/Time: 10/13/2023/3:26:11 PM    Final   MONITORS  LONG TERM MONITOR (3-14 DAYS) 11/18/2020  Narrative 1. SR/SB/ST 2. Occasional PACs and PVCs 3. Short runs of SVT and NSVT 4. PAF with slow and RVR 4. Needs ROV to discuss             Current Reported Medications:.    Current Meds  Medication Sig   acetaminophen (TYLENOL) 500 MG tablet Take 1,000-1,500 mg by mouth every 6 (six) hours as needed for moderate pain.   ALPRAZolam (XANAX) 0.5 MG tablet Take 0.25-0.5 mg by mouth at bedtime.   amiodarone (PACERONE) 200 MG tablet Take 0.5 tablets (100 mg total) by mouth daily.   aspirin EC 81 MG tablet Take 81 mg by mouth daily.   cyanocobalamin (VITAMIN B12) 1000 MCG tablet Take 1 tablet (1,000 mcg total) by mouth  daily.   Elastic Bandages & Supports (MEDICAL COMPRESSION STOCKINGS) MISC Knee High 20-30 mm compression stockings   empagliflozin (JARDIANCE) 10 MG TABS tablet Take 1 tablet (10 mg total) by mouth daily.   ezetimibe (ZETIA) 10 MG tablet Take 1 tablet (10 mg total) by mouth daily.   folic acid (FOLVITE) 1 MG tablet Take 1 mg by mouth daily.   metFORMIN (GLUCOPHAGE) 1000 MG tablet Take 500 mg by mouth 2 (two) times daily with a meal.   Misc Natural Products (IMMUNE FORMULA PO) Take 1 each by mouth daily. Immune: Vitamin C, D and Zinc. Chew 1 tablets daily.   omeprazole (PRILOSEC) 40 MG capsule TAKE ONE CAPSULE BY MOUTH DAILY AS NEEDED FOR HEARTBURN   rosuvastatin (CRESTOR) 20 MG tablet TAKE 1 TABLET(20 MG) BY MOUTH DAILY   torsemide (DEMADEX) 20 MG tablet Take 1 tablet (20 mg total) by mouth 2 (two) times daily.   Vitamin D, Ergocalciferol, (DRISDOL) 1.25 MG (50000 UNIT) CAPS capsule Take 1 capsule (50,000 Units total) by mouth every 7 (seven) days.   warfarin (COUMADIN) 1 MG tablet Take 1 mg by mouth See admin instructions. Alternate 1mg  with 2mg  dose on a schedule of once daily 2mg , 2mg , 1mg  and repeat pattern.   warfarin (COUMADIN) 2 MG tablet Take 2 mg by mouth See admin instructions. Alternate 1mg  with 2mg  dose on a schedule of once daily 2mg , 2mg , 1mg  and repeat pattern.    Physical Exam:    VS:  BP 124/68   Pulse 61   Ht 5\' 6"  (1.676 m)   Wt 202 lb 3.2 oz (91.7 kg)   SpO2 97%   BMI 32.64 kg/m    Wt Readings from Last 3 Encounters:  12/21/23 202 lb 3.2 oz (91.7 kg)  11/14/23 190 lb (86.2 kg)  11/10/23 195 lb (88.5 kg)    GEN: Well nourished, well developed in no acute distress NECK: No JVD; No carotid bruits CARDIAC: RRR, no murmurs, rubs, gallops RESPIRATORY:  Clear to auscultation without rales, wheezing or rhonchi  ABDOMEN: Soft, non-tender, non-distended EXTREMITIES:  No edema; No acute deformity   Asessement and Plan:.    HFpEF: Echo 11/24 indicated LVEF 55 to 60%, RV  mildly reduced, RVSP 41 mmHg, dilated IVC.  RHC on 11/24 after diuresis: RA mean 8 PA mean 25, PCWP mean 15, LVEDP  16, Fick CO 9.35/CO 4.62. Today he appears euvolemic and well compensated on exam.  He reports stable weights at home.  He denies any shortness of breath or lower extremity edema. He denies any shortness of breath, he has no lower extremity edema.  He reports is the best he has felt in years.  Reviewed ED precautions.  Discussed with patient to notify the office of weight gain of 2 to 3 pounds overnight or 5 pounds in a week.  He continues to work on low-sodium diet. Continue Jardiance 10 mg daily and torsemide 40 mg daily. Check BMET today.  ADDENDUM: When resulting patients BMET he reported that he was taking Torsemide 40 mg twice daily, will decrease to Torsemide 40 mg once daily and he can take one additional 40 mg as needed for lower extremity edema, shortness of breath or weight gain of 2-3 lbs overnight.   CAD: S/p CABG with LIMA to LAD, free radial to LCx, SVG to a.m. and distal RCA in 2003.  LHC on 10/20/2023 with severe native three-vessel CAD with patent bypass grafts. Stable with no anginal symptoms. No indication for ischemic evaluation. Heart healthy diet and regular cardiovascular exercise encouraged. Continue aspirin 81 mg daily, Zetia 10 mg daily, Crestor 20 mg daily, torsemide 40 mg daily.    Bradycardia/Persistent atrial fibrillation: Coreg discontinued given sinus bradycardia during admission, heart rate in the 40's at admission.  On amiodarone 100 mg daily. Today he denies palpitations or irregular heart beats. He denies any further dizziness, presyncope or syncope.  Continue amiodarone 100 mg daily. On coumadin per coumadin clinic with [facility]. Last TSH 1.807 on 10/12/2023. AST 54 and ALT 46 on 10/18/23.   Anemia: Noted to have hemoglobin of 8.3 on admission in 09/2023.  There were no obvious signs of blood loss, stool heme was negative on presentation, B12 was low at 169.   His PCP is now monitoring.   Hyperlipidemia: Last lipid profile on 08/01/2023 revealed total cholesterol 82, HDL 34 and LDL of 36.  Continue Zetia and Crestor.  OSA: On CPAP followed by Dr. Tresa Endo. Reports compliance.     Disposition: F/u with Dr. Allyson Sabal in three months.   Signed, Rip Harbour, NP

## 2023-12-21 NOTE — Patient Instructions (Signed)
Medication Instructions:  No changes *If you need a refill on your cardiac medications before your next appointment, please call your pharmacy*  Lab Work: Today we are going to draw Bmet If you have labs (blood work) drawn today and your tests are completely normal, you will receive your results only by: MyChart Message (if you have MyChart) OR A paper copy in the mail If you have any lab test that is abnormal or we need to change your treatment, we will call you to review the results.  Testing/Procedures: No testing  Follow-Up: At [facility], you and your health needs are our priority.  As part of our continuing mission to provide you with exceptional heart care, we have created designated Provider Care Teams.  These Care Teams include your primary Cardiologist (physician) and Advanced Practice Providers (APPs -  Physician Assistants and Nurse Practitioners) who all work together to provide you with the care you need, when you need it.  We recommend signing up for the patient portal called "MyChart".  Sign up information is provided on this After Visit Summary.  MyChart is used to connect with patients for Virtual Visits (Telemedicine).  Patients are able to view lab/test results, encounter notes, upcoming appointments, etc.  Non-urgent messages can be sent to your provider as well.   To learn more about what you can do with MyChart, go to [email or web address].    Your next appointment:   3 month(s)  Provider:   Nanetta Batty, MD

## 2023-12-22 LAB — BASIC METABOLIC PANEL
BUN/Creatinine Ratio: 12 (ref 10–24)
BUN: 19 mg/dL (ref 8–27)
CO2: 27 mmol/L (ref 20–29)
Calcium: 9.2 mg/dL (ref 8.6–10.2)
Chloride: 102 mmol/L (ref 96–106)
Creatinine, Ser: 1.61 mg/dL — ABNORMAL HIGH (ref 0.76–1.27)
Glucose: 143 mg/dL — ABNORMAL HIGH (ref 70–99)
Potassium: 4.1 mmol/L (ref 3.5–5.2)
Sodium: 145 mmol/L — ABNORMAL HIGH (ref 134–144)
eGFR: 45 mL/min/{1.73_m2} — ABNORMAL LOW (ref >59)

## 2023-12-26 ENCOUNTER — Ambulatory Visit: Payer: PPO | Admitting: Bariatrics

## 2023-12-26 ENCOUNTER — Telehealth: Payer: Self-pay

## 2023-12-26 MED ORDER — TORSEMIDE 40 MG PO TABS: 40.0000 mg | ORAL_TABLET | Freq: Every day | ORAL | Status: DC

## 2023-12-26 NOTE — Telephone Encounter (Signed)
Called patient to advise of below, Patient made me aware that they are on Torsemide 40 mg BID instead of the 20mg  Went and talked to [place]. [facility] [place] okayed current dose but to have patient take Toresmide 40 mg once a day then take an additional 40 mg as needed for  lower extremity edema, weight gain of 2-3 lbs overnight or five lbs in a week. Advised patient and they agreed to the changes.

## 2023-12-26 NOTE — Telephone Encounter (Signed)
-----   Message from [facility] [place] sent at 12/26/2023 11:56 AM EST ----- Please let Mr. Steck know that his kidney function is slightly reduced, his electrolytes are normal. Would recommend reducing torsemide to 20 mg once daily, he can take one additional 20 mg if he notes increased lower extremity edema, weight gain of 2-3 lbs overnight or five lbs in a week.

## 2024-01-15 ENCOUNTER — Other Ambulatory Visit: Payer: Self-pay | Admitting: Cardiology

## 2024-01-25 ENCOUNTER — Ambulatory Visit (INDEPENDENT_AMBULATORY_CARE_PROVIDER_SITE_OTHER): Payer: PPO | Admitting: Bariatrics

## 2024-01-25 ENCOUNTER — Other Ambulatory Visit: Payer: Self-pay

## 2024-01-25 ENCOUNTER — Encounter: Payer: Self-pay | Admitting: Bariatrics

## 2024-01-25 VITALS — BP 159/70 | HR 44 | Temp 97.4°F | Ht 66.0 in | Wt 197.0 lb

## 2024-01-25 DIAGNOSIS — Z6831 Body mass index (BMI) 31.0-31.9, adult: Secondary | ICD-10-CM | POA: Diagnosis not present

## 2024-01-25 DIAGNOSIS — E119 Type 2 diabetes mellitus without complications: Secondary | ICD-10-CM | POA: Diagnosis not present

## 2024-01-25 DIAGNOSIS — Z7984 Long term (current) use of oral hypoglycemic drugs: Secondary | ICD-10-CM | POA: Diagnosis not present

## 2024-01-25 DIAGNOSIS — E669 Obesity, unspecified: Secondary | ICD-10-CM

## 2024-01-25 DIAGNOSIS — E1169 Type 2 diabetes mellitus with other specified complication: Secondary | ICD-10-CM

## 2024-01-25 DIAGNOSIS — E559 Vitamin D deficiency, unspecified: Secondary | ICD-10-CM

## 2024-01-25 DIAGNOSIS — E66811 Obesity, class 1: Secondary | ICD-10-CM

## 2024-01-25 MED ORDER — VITAMIN D (ERGOCALCIFEROL) 1.25 MG (50000 UNIT) PO CAPS: 50000.0000 [IU] | ORAL_CAPSULE | ORAL | 0 refills | Status: DC

## 2024-01-25 NOTE — Progress Notes (Signed)
 WEIGHT SUMMARY AND BIOMETRICS  Weight Lost Since Last Visit: 0  Weight Gained Since Last Visit: 7lb   Vitals Temp: (!) 97.4 F (36.3 C) BP: (!) 159/70 Pulse Rate: (!) 44 SpO2: 97 %   Anthropometric Measurements Height: 5\' 6"  (1.676 m) Weight: 197 lb (89.4 kg) BMI (Calculated): 31.81 Weight at Last Visit: 190lb Weight Lost Since Last Visit: 0 Weight Gained Since Last Visit: 7lb Starting Weight: 253lb Total Weight Loss (lbs): 56 lb (25.4 kg)   Body Composition  Body Fat %: 28.7 % Fat Mass (lbs): 56.8 lbs Muscle Mass (lbs): 133.8 lbs Total Body Water (lbs): 108.2 lbs Visceral Fat Rating : 18   Other Clinical Data Fasting: no Labs: no Today's Visit #: 16 Starting Date: 10/24/22    OBESITY Eduardo Butler is here to discuss his progress with his obesity treatment plan along with follow-up of his obesity related diagnoses.    Nutrition Plan: the Category 3 plan - 25% adherence.  Current exercise: bicycling and weightlifting 5-7 days for 35 mins  Interim History:  He is up 7 lbs since his last visit.  The bioimpedance scale shows that his muscle is up almost 4 pounds and his water is up approximately 3 pounds.  He states that he is taking his diuretic every day and is watching his water intake. Eating all of the food on the plan., Protein intake is as prescribed, Is not skipping meals, Water intake is adequate., Denies polyphagia, and Denies excessive cravings.   Pharmacotherapy: Eduardo Butler is on Metformin 500 mg 1/2 tablet daily.  Adverse side effects: None Hunger is moderately controlled.  Cravings are moderately controlled.  Assessment/Plan:   Type II Diabetes HgbA1c is at goal. Last A1c was 6.2 CBGs: Not checking      Episodes of hypoglycemia: no Medication(s): Jardiance 10 mg  Lab Results  Component Value Date   HGBA1C 6.2 (H) 10/12/2023   Lab Results   Component Value Date   LDLCALC 82 11/24/2021   CREATININE 1.61 (H) 12/21/2023     Plan: Continue all other medications.  Will keep all carbohydrates low both sweets and starches.  He will continue to cook more at home. Aim for 7 to 9 hours of sleep nightly.  Eat more low glycemic index foods.  He will continue his exercise regimen incorporating both cardio and resistance. He will monitor his salt intake and will keep his water intake at 64 ounces.    Generalized Obesity: Current BMI BMI (Calculated): 31.81   Pharmacotherapy Plan He had been on Ozempic in the past but had stopped during his hospitalization and does not want to resume it at this time.  Eduardo Butler is currently in the action stage of change. As such, his goal is to continue with weight loss efforts.  He has agreed to the Category 3 plan.  Exercise goals: Older adults should determine their level  of effort for physical activity relative to their level of fitness.   Behavioral modification strategies: increasing lean protein intake, no meal skipping, meal planning , increase water intake, better snacking choices, planning for success, and keep healthy foods in the home.  Eduardo Butler has agreed to follow-up with our clinic in 8 weeks.      Objective:   VITALS: Per patient if applicable, see vitals. GENERAL: Alert and in no acute distress. CARDIOPULMONARY: No increased WOB. Speaking in clear sentences.  PSYCH: Pleasant and cooperative. Speech normal rate and rhythm. Affect is appropriate. Insight and judgement are appropriate. Attention is focused, linear, and appropriate.  NEURO: Oriented as arrived to appointment on time with no prompting.   Attestation Statements:   This was prepared with the assistance of Engineer, civil (consulting).  Occasional wrong-word or sound-a-like substitutions may have occurred due to the inherent limitations of voice recognition   Corinna Capra, DO

## 2024-01-29 ENCOUNTER — Encounter: Payer: Self-pay | Admitting: Bariatrics

## 2024-01-29 ENCOUNTER — Telehealth: Payer: Self-pay

## 2024-01-29 ENCOUNTER — Other Ambulatory Visit: Payer: Self-pay | Admitting: Bariatrics

## 2024-01-29 DIAGNOSIS — E1169 Type 2 diabetes mellitus with other specified complication: Secondary | ICD-10-CM

## 2024-01-29 MED ORDER — ONDANSETRON HCL 4 MG PO TABS: 4.0000 mg | ORAL_TABLET | Freq: Three times a day (TID) | ORAL | 0 refills | Status: DC | PRN

## 2024-01-29 NOTE — Telephone Encounter (Signed)
 Refill request sent from [facility] for Ondansetron 4 mg. Not on current med list.

## 2024-02-19 ENCOUNTER — Other Ambulatory Visit (HOSPITAL_COMMUNITY): Payer: Self-pay

## 2024-02-19 ENCOUNTER — Other Ambulatory Visit: Payer: Self-pay | Admitting: Cardiology

## 2024-02-21 DIAGNOSIS — I4891 Unspecified atrial fibrillation: Secondary | ICD-10-CM | POA: Diagnosis not present

## 2024-02-21 DIAGNOSIS — Z7901 Long term (current) use of anticoagulants: Secondary | ICD-10-CM | POA: Diagnosis not present

## 2024-02-23 ENCOUNTER — Other Ambulatory Visit: Payer: Self-pay | Admitting: Cardiology

## 2024-03-19 ENCOUNTER — Ambulatory Visit: Payer: PPO | Attending: Cardiovascular Disease | Admitting: Cardiovascular Disease

## 2024-03-19 ENCOUNTER — Encounter: Payer: Self-pay | Admitting: Cardiovascular Disease

## 2024-03-19 VITALS — BP 136/70 | HR 46 | Ht 66.0 in | Wt 198.0 lb

## 2024-03-19 DIAGNOSIS — I480 Paroxysmal atrial fibrillation: Secondary | ICD-10-CM

## 2024-03-19 DIAGNOSIS — I10 Essential (primary) hypertension: Secondary | ICD-10-CM

## 2024-03-19 DIAGNOSIS — G4733 Obstructive sleep apnea (adult) (pediatric): Secondary | ICD-10-CM

## 2024-03-19 DIAGNOSIS — E782 Mixed hyperlipidemia: Secondary | ICD-10-CM

## 2024-03-19 DIAGNOSIS — I25810 Atherosclerosis of coronary artery bypass graft(s) without angina pectoris: Secondary | ICD-10-CM

## 2024-03-19 DIAGNOSIS — R600 Localized edema: Secondary | ICD-10-CM

## 2024-03-19 DIAGNOSIS — Z7901 Long term (current) use of anticoagulants: Secondary | ICD-10-CM | POA: Diagnosis not present

## 2024-03-19 NOTE — Assessment & Plan Note (Signed)
 History of essential hypertension blood pressure measured today 136/70.  He is not on antihypertensive medications.

## 2024-03-19 NOTE — Progress Notes (Signed)
 03/19/2024 Eduardo Butler   04/10/1951  [id number]  Primary Physician Wyn Heater, MD Primary Cardiologist: Avanell Leigh MD FACP, [place], [place], [place]  HPI:  Eduardo Butler is a 73 y.o.   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 Nathen Balder follows. His other problems include hyperlipidemia and erectile dysfunction. I catheterized him at the [facility] September 28th , 2007 revealing patent grafts with normal LV function .  This suggested that he had a false positive Myoview  stress test and medical therapy was recommended.  His last Myoview  performed 01/04/13 was nonischemic Dr. Aisha Ali follows his lipid profile. When I saw him 2 months ago he was complaining of dyspnea on exertion. He saw one of our PAs a month later with increasing dyspnea on exertion. His diastolic was cut in half but remains bradycardic although he was clinically improved. His when necessary hydrochlorothiazide was changed to when necessary Lasix  which she took over the weekend for several doses which resulted in improved dyspnea and decreased edema. I did discuss with him today so restriction. A 2-D echo was ordered that showed normal FUNCTION in a Myoview  stress test that showed apical thinning with new ischemia in the inferior wall compared to a previous Myoview  2 years ago. Since I saw him 3 months ago he continued to do well.  He says he is stronger.  I did adjust his diuretics which improved his lower extremity edema.  He also admits to dietary indiscretion.  He denies  chest pain does have chronic dyspnea on exertion.   He has seen Ervin Heath PA-C in the office 03/29/2020...  His major complaint is of dyspnea.  He has not lost any weight.  He denies chest pain.  An echo performed 03/07/2018 was completely normal.  He has been more dyspneic and weaker since I saw him back in December.  A Myoview  stress test ordered by Ervin Heath 04/08/2020 showed subtle anterior ischemia only mildly different from the last Myoview  performed 7/17.  However, given the fact that his last cath was 14 years ago I decided to perform outpatient cardiac catheterization on 04/23/2020 revealing patent grafts.  2D echo revealed normal LV function.   He  was seen in the ER 09/13/2020 with dyspnea with negative work-up.  Chest x-ray showed no active disease.  He saw Dr. Aisha Ali in the office on 10/16/2020 and an EKG on that date did show atrial fibrillation.  He remains on warfarin anticoagulation.  His hemoglobin was apparently in the mid 12 range.   He had an event monitor that showed predominantly sinus rhythm with PACs, PVCs, short runs of SVT and not as VT as well as runs of PAF with slow ventricular response.  He is on warfarin oral anticoagulation.  I referred him to Dr. Lawana Pray for evaluation of A-fib who discussed A-fib ablation versus dofetilide.  They decided to pursue weight reduction and evaluation for sleep apnea to begin with.  He feels much better in sinus rhythm.  He is on warfarin and maintains INR is  in therapeutic window.  I did refer him to Dr. Loetta Ringer and he is currently using CPAP and feels somewhat clinically improved.  He is in A-fib today.  He does complain of persistent dyspnea.  He has been unable to lose weight on his own.   I did refer him to Dr. Lawana Pray.  He had successful outpatient cardioversion by Dr. Andre Band 11/18/2019 and was clinically improved after that.  He is on amiodarone .  He has been on Ozempic  and is lost 30 pounds.  Since I saw him 6 months ago he was admitted to  the hospital November 24 with GI bleed.  He also had right left heart cath by Dr. Nalin Mazzocco Bristol 10/20/2023 revealing unchanged anatomy compared to the previous cath in 21 by myself with patent grafts.  He has been diuresed with furosemide  down to his dry weight.  With his significant weight loss secondary to GLP-1 agonist down to the 100 9295 range he feels clinically improved.  He currently lifts weights, does yard work and rides his exercise bike without limitation.  Current Meds  Medication Sig   acetaminophen  (TYLENOL ) 500 MG tablet Take 1,000-1,500 mg by mouth every 6 (six) hours as needed for moderate pain.   ALPRAZolam  (XANAX ) 0.5 MG tablet Take 0.25-0.5 mg by mouth at bedtime.   amiodarone  (PACERONE ) 200 MG tablet Take 0.5 tablets (100 mg total) by mouth daily.   aspirin  EC 81 MG tablet Take 81 mg by mouth daily.   cyanocobalamin  (VITAMIN B12) 1000 MCG tablet Take 1 tablet (1,000 mcg total) by mouth daily.   Elastic Bandages & Supports (MEDICAL COMPRESSION STOCKINGS) MISC Knee High 20-30 mm compression stockings   empagliflozin  (JARDIANCE ) 10 MG TABS tablet TAKE 1 TABLET(10 MG) BY MOUTH DAILY   ezetimibe  (ZETIA ) 10 MG tablet Take 1 tablet (10 mg total) by mouth daily.   folic acid  (FOLVITE ) 1 MG tablet Take 1 mg by mouth daily.   metFORMIN  (GLUCOPHAGE ) 1000 MG tablet Take 500 mg by mouth 2 (two) times daily with a meal.   Misc Natural Products (IMMUNE FORMULA PO) Take 1 each by mouth daily. Immune: Vitamin C, D and Zinc. Chew 1 tablets daily.   omeprazole  (PRILOSEC) 40 MG capsule TAKE ONE CAPSULE BY MOUTH DAILY AS NEEDED FOR HEARTBURN   rosuvastatin  (CRESTOR ) 20 MG tablet TAKE 1 TABLET(20 MG) BY MOUTH DAILY   Torsemide  40 MG TABS Take 40 mg by mouth daily. Take and additional 40 mg as needed for lower extremity edema or a weight gain of 2-3 lbs (Patient taking differently: Take 20 mg by mouth daily. Take and additional 20 mg as needed for lower extremity edema or a weight gain of 2-3 lbs)   Vitamin  D, Ergocalciferol , (DRISDOL ) 1.25 MG (50000 UNIT) CAPS capsule Take 1 capsule (50,000 Units total) by mouth every 7 (seven) days.   warfarin (COUMADIN ) 1 MG tablet Take 1 mg by mouth See admin instructions. Alternate 1mg  with 2mg  dose on a schedule of once daily 2mg , 2mg , 1mg  and repeat pattern.   warfarin (COUMADIN ) 2 MG tablet Take 2 mg by mouth See admin instructions. Alternate 1mg  with 2mg  dose on a schedule of once daily 2mg , 2mg , 1mg  and repeat pattern.     Allergies  Allergen Reactions   Morphine  And Codeine     hallucinations      Social History   Socioeconomic History   Marital status: Married    Spouse name: Not on file   Number of children: Not on file  Years of education: Not on file   Highest education level: Not on file  Occupational History   Not on file  Tobacco Use   Smoking status: Never   Smokeless tobacco: Never  Substance and Sexual Activity   Alcohol use: No    Comment: none since 1979   Drug use: No   Sexual activity: Not on file  Other Topics Concern   Not on file  Social History Narrative   Not on file   Social Drivers of Health   Financial Resource Strain: Low Risk  (04/07/2022)   Received from [facility], [facility]   Overall Financial Resource Strain (CARDIA)    Difficulty of Paying Living Expenses: Not very hard  Food Insecurity: No Food Insecurity (10/12/2023)   Hunger Vital Sign    Worried About Running Out of Food in the Last Year: Never true    Ran Out of Food in the Last Year: Never true  Transportation Needs: No Transportation Needs (10/12/2023)   PRAPARE - Administrator, Civil Service (Medical): No    Lack of Transportation (Non-Medical): No  Physical Activity: Unknown (04/11/2023)   Received from [facility], [facility]   Exercise Vital Sign    Days of Exercise per Week: Patient declined    Minutes of Exercise per Session: Not on file  Stress: Patient Declined (04/11/2023)   Received from [facility],  [facility] - Occupational Stress Questionnaire    Feeling of Stress : Patient declined  Social Connections: Unknown (03/31/2022)   Received from [facility], [facility]   Social Network    Social Network: Not on file  Intimate Partner Violence: Not At Risk (10/12/2023)   Humiliation, Afraid, Rape, and Kick questionnaire    Fear of Current or Ex-Partner: No    Emotionally Abused: No    Physically Abused: No    Sexually Abused: No     Review of Systems: General: negative for chills, fever, night sweats or weight changes.  Cardiovascular: negative for chest pain, dyspnea on exertion, edema, orthopnea, palpitations, paroxysmal nocturnal dyspnea or shortness of breath Dermatological: negative for rash Respiratory: negative for cough or wheezing Urologic: negative for hematuria Abdominal: negative for nausea, vomiting, diarrhea, bright red blood per rectum, melena, or hematemesis Neurologic: negative for visual changes, syncope, or dizziness All other systems reviewed and are otherwise negative except as noted above.    Blood pressure 136/70, pulse (!) 46, height 5\' 6"  (1.676 m), weight 198 lb (89.8 kg), SpO2 96%.  General appearance: alert and no distress Neck: no adenopathy, no carotid bruit, no JVD, supple, symmetrical, trachea midline, and thyroid  not enlarged, symmetric, no tenderness/mass/nodules Lungs: clear to auscultation bilaterally Heart: regular rate and rhythm, S1, S2 normal, no murmur, click, rub or gallop Extremities: extremities normal, atraumatic, no cyanosis or edema Pulses: 2+ and symmetric Skin: Skin color, texture, turgor normal. No rashes or lesions Neurologic: Grossly normal  EKG not performed today      ASSESSMENT AND PLAN:   Hyperlipidemia History of hyperlipidemia on statin therapy and Zetia  with lipid profile performed 08/01/2023 revealing total cholesterol 82, LDL of 36 and HDL 34.  Benign  hypertension History of essential hypertension blood pressure measured today 136/70.  He is not on antihypertensive medications.  Coronary artery disease History of CAD status post coronary artery bypass grafting times 29 January 2002 with a LIMA to his LAD, free radial to the left circumflex marginal branch and sequential vein  to the acute marginal branch of the RCA and distal RCA.  He has had multiple cardiac catheterizations since that time including by myself 04/23/2020 revealing patent grafts and most recently by Dr. Selim Durden Bristol 10/20/2023 revealing similar anatomy.  He denies chest pain or shortness of breath.  Long term (current) use of anticoagulants History of lupus anticoagulant on warfarin anticoagulation status post pulmonary embolism in the past as well as PAF.  Paroxysmal atrial fibrillation (HCC) History of PAF status post outpatient cardioversion by Dr. Alda Amas 11/18/2019 with clinical improvement.  He is maintained sinus rhythm on amiodarone .  OSA (obstructive sleep apnea) History of obstructive sleep apnea on CPAP  Bilateral lower extremity edema Bilateral lower extreme edema secondary to diastolic dysfunction currently euvolemic on torsemide .     Avanell Leigh MD [facility], [facility] 03/19/2024 9:52 AM

## 2024-03-19 NOTE — Assessment & Plan Note (Signed)
 History of hyperlipidemia on statin therapy and Zetia  with lipid profile performed 08/01/2023 revealing total cholesterol 82, LDL of 36 and HDL 34.

## 2024-03-19 NOTE — Assessment & Plan Note (Signed)
 History of PAF status post outpatient cardioversion by Dr. Alda Amas 11/18/2019 with clinical improvement.  He is maintained sinus rhythm on amiodarone .

## 2024-03-19 NOTE — Patient Instructions (Signed)
 Medication Instructions:  Your physician recommends that you continue on your current medications as directed. Please refer to the Current Medication list given to you today.  *If you need a refill on your cardiac medications before your next appointment, please call your pharmacy*  Follow-Up: At [facility], you and your health needs are our priority.  As part of our continuing mission to provide you with exceptional heart care, our providers are all part of one team.  This team includes your primary Cardiologist (physician) and Advanced Practice Providers or APPs (Physician Assistants and Nurse Practitioners) who all work together to provide you with the care you need, when you need it.  Your next appointment:   6 month(s)  Provider:   Hao Meng, PA-C or Katlyn West, NP       Then, Lauro Portal, MD will plan to see you again in 12 month(s).    We recommend signing up for the patient portal called "MyChart".  Sign up information is provided on this After Visit Summary.  MyChart is used to connect with patients for Virtual Visits (Telemedicine).  Patients are able to view lab/test results, encounter notes, upcoming appointments, etc.  Non-urgent messages can be sent to your provider as well.   To learn more about what you can do with MyChart, go to [email or web address].   Other Instructions       1st Floor: - Lobby - Registration  - Pharmacy  - Lab - Cafe  2nd Floor: - PV Lab - Diagnostic Testing (echo, CT, nuclear med)  3rd Floor: - Vacant  4th Floor: - TCTS (cardiothoracic surgery) - AFib Clinic - Structural Heart Clinic - Vascular Surgery  - Vascular Ultrasound  5th Floor: - HeartCare Cardiology (general and EP) - Clinical Pharmacy for coumadin , hypertension, lipid, weight-loss medications, and med management appointments    Valet parking services will be available as well.

## 2024-03-19 NOTE — Assessment & Plan Note (Signed)
 History of lupus anticoagulant on warfarin anticoagulation status post pulmonary embolism in the past as well as PAF.

## 2024-03-19 NOTE — Assessment & Plan Note (Signed)
 History of obstructive sleep apnea on CPAP.

## 2024-03-19 NOTE — Assessment & Plan Note (Signed)
 Bilateral lower extreme edema secondary to diastolic dysfunction currently euvolemic on torsemide .

## 2024-03-19 NOTE — Assessment & Plan Note (Signed)
 History of CAD status post coronary artery bypass grafting times 29 January 2002 with a LIMA to his LAD, free radial to the left circumflex marginal branch and sequential vein to the acute marginal branch of the RCA and distal RCA.  He has had multiple cardiac catheterizations since that time including by myself 04/23/2020 revealing patent grafts and most recently by Dr. Junia Nygren Bristol 10/20/2023 revealing similar anatomy.  He denies chest pain or shortness of breath.

## 2024-03-21 DIAGNOSIS — I10 Essential (primary) hypertension: Secondary | ICD-10-CM | POA: Diagnosis not present

## 2024-03-21 DIAGNOSIS — Z7901 Long term (current) use of anticoagulants: Secondary | ICD-10-CM | POA: Diagnosis not present

## 2024-03-21 DIAGNOSIS — D6869 Other thrombophilia: Secondary | ICD-10-CM | POA: Diagnosis not present

## 2024-03-21 DIAGNOSIS — E1169 Type 2 diabetes mellitus with other specified complication: Secondary | ICD-10-CM | POA: Diagnosis not present

## 2024-03-21 DIAGNOSIS — I4891 Unspecified atrial fibrillation: Secondary | ICD-10-CM | POA: Diagnosis not present

## 2024-03-21 DIAGNOSIS — I25810 Atherosclerosis of coronary artery bypass graft(s) without angina pectoris: Secondary | ICD-10-CM | POA: Diagnosis not present

## 2024-03-21 DIAGNOSIS — E782 Mixed hyperlipidemia: Secondary | ICD-10-CM | POA: Diagnosis not present

## 2024-03-21 DIAGNOSIS — F419 Anxiety disorder, unspecified: Secondary | ICD-10-CM | POA: Diagnosis not present

## 2024-03-22 ENCOUNTER — Other Ambulatory Visit: Payer: Self-pay | Admitting: Cardiology

## 2024-03-25 ENCOUNTER — Ambulatory Visit: Payer: PPO | Admitting: Bariatrics

## 2024-03-29 DIAGNOSIS — E1151 Type 2 diabetes mellitus with diabetic peripheral angiopathy without gangrene: Secondary | ICD-10-CM | POA: Diagnosis not present

## 2024-03-29 DIAGNOSIS — B351 Tinea unguium: Secondary | ICD-10-CM | POA: Diagnosis not present

## 2024-03-29 DIAGNOSIS — E1142 Type 2 diabetes mellitus with diabetic polyneuropathy: Secondary | ICD-10-CM | POA: Diagnosis not present

## 2024-04-01 DIAGNOSIS — I4891 Unspecified atrial fibrillation: Secondary | ICD-10-CM | POA: Diagnosis not present

## 2024-04-01 DIAGNOSIS — Z7901 Long term (current) use of anticoagulants: Secondary | ICD-10-CM | POA: Diagnosis not present

## 2024-04-07 ENCOUNTER — Other Ambulatory Visit: Payer: Self-pay | Admitting: Cardiology

## 2024-04-08 ENCOUNTER — Telehealth: Payer: Self-pay | Admitting: Cardiology

## 2024-04-08 MED ORDER — AMIODARONE HCL 200 MG PO TABS: 100.0000 mg | ORAL_TABLET | Freq: Every day | ORAL | 0 refills | Status: DC

## 2024-04-08 NOTE — Telephone Encounter (Signed)
*  STAT* If patient is at the pharmacy, call can be transferred to refill team.   1. Which medications need to be refilled? (please list name of each medication and dose if known) amiodarone  (PACERONE ) 200 MG tablet   Take 0.5 tablets (100 mg total) by mouth daily.    2. Would you like to learn more about the convenience, safety, & potential cost savings by using the [facility]? No    3. Are you open to using the [facility] No   4. Which pharmacy/location (including street and city if local pharmacy) is medication to be sent to?[id number] - [place], [place] - [address]    5. Do they need a 30 day or 90 day supply? 30 Day Supply  Pt is scheduled with PA Renee for 04/19/24 at 10:05 AM. Patient is leaving out of town Wednesday and will not be back in town till 04/18/24.

## 2024-04-08 NOTE — Telephone Encounter (Signed)
 RX sent to requested Pharmacy

## 2024-04-19 ENCOUNTER — Ambulatory Visit: Admitting: Physician Assistant

## 2024-04-25 ENCOUNTER — Ambulatory Visit (INDEPENDENT_AMBULATORY_CARE_PROVIDER_SITE_OTHER): Admitting: Bariatrics

## 2024-04-25 ENCOUNTER — Encounter: Payer: Self-pay | Admitting: Bariatrics

## 2024-04-25 VITALS — BP 136/56 | HR 41 | Temp 97.5°F | Ht 66.0 in | Wt 201.0 lb

## 2024-04-25 DIAGNOSIS — Z6832 Body mass index (BMI) 32.0-32.9, adult: Secondary | ICD-10-CM | POA: Diagnosis not present

## 2024-04-25 DIAGNOSIS — Z7984 Long term (current) use of oral hypoglycemic drugs: Secondary | ICD-10-CM

## 2024-04-25 DIAGNOSIS — E669 Obesity, unspecified: Secondary | ICD-10-CM | POA: Diagnosis not present

## 2024-04-25 DIAGNOSIS — E1169 Type 2 diabetes mellitus with other specified complication: Secondary | ICD-10-CM

## 2024-04-25 NOTE — Progress Notes (Signed)
 WEIGHT SUMMARY AND BIOMETRICS  Weight Lost Since Last Visit: 0  Weight Gained Since Last Visit: 4lb   Vitals Temp: (!) 97.5 F (36.4 C) BP: (!) 136/56 Pulse Rate: (!) 41 SpO2: 97 %   Anthropometric Measurements Height: 5\' 6"  (1.676 m) Weight: 201 lb (91.2 kg) BMI (Calculated): 32.46 Weight at Last Visit: 197lb Weight Lost Since Last Visit: 0 Weight Gained Since Last Visit: 4lb Starting Weight: 253lb Total Weight Loss (lbs): 52 lb (23.6 kg)   Body Composition  Body Fat %: 28.1 % Fat Mass (lbs): 56.6 lbs Muscle Mass (lbs): 137.8 lbs Total Body Water (lbs): 107.6 lbs Visceral Fat Rating : 18   Other Clinical Data Fasting: no Labs: no Today's Visit #: 17 Starting Date: 10/24/22    OBESITY Eduardo Butler is here to discuss his progress with his obesity treatment plan along with follow-up of his obesity related diagnoses.    Nutrition Plan: the Category 3 plan - 50% adherence.  Current exercise: bicycling, weightlifting, yard work, and golfing  Interim History:  He is up 4 pounds since his last visit.  The interim he has been on vacation and he did have a fall from his bike last week which is slow down his progress. Eating all of the food on the plan., Protein intake is as prescribed, Is skipping meals, Water intake is adequate., and Denies polyphagia   Pharmacotherapy: Eduardo Butler is on metformin  1000 mg twice daily with meals Adverse side effects: None Hunger is moderately controlled.  Cravings are moderately controlled.  Assessment/Plan:   Type II Diabetes HgbA1c is at goal. Last A1c was 6.2 CBGs: Not checking      Episodes of hypoglycemia: no Medication(s): Metformin  500 mg once daily.   Lab Results  Component Value Date   HGBA1C 6.2 (H) 10/12/2023   Lab Results  Component Value Date   LDLCALC 82 11/24/2021   CREATININE 1.61 (H) 12/21/2023   No  results found for: "GFR"  Plan: Continue Metformin  500 mg once daily  Will keep all carbohydrates low both sweets and starches.  Will continue exercise regimen to 30 to 60 minutes on most days of the week.  Aim for 7 to 9 hours of sleep nightly.  Eat more low glycemic index foods.    Generalized Obesity: Current BMI BMI (Calculated): 32.46   Pharmacotherapy Plan He had been on Ozempic  in the past but stopped the medication due to some mild side effects.  He states that he may want to consider going back on Ozempic  in the future if he is not able to lose more weight.  He would like to work over the summer exercising and working on his diet before he makes that decision.  Eduardo Butler is currently in the action stage of change. As such, his goal is to continue with weight loss efforts.  He has agreed to the Category 3  plan with high protein options.   Exercise goals: Older adults should determine their level of effort for physical activity relative to their level of fitness.  He is quite active riding his bike outside and also a stationary bike at home.  Also is doing gardening  Behavioral modification strategies: emotional eating strategies, decrease junk food, get rid of junk food in the home, decrease snacking , avoiding temptations, keep healthy foods in the home, weigh protein portions, and mindful eating.  Eduardo Butler has agreed to follow-up with our clinic in 2 months.  Objective:   VITALS: Per patient if applicable, see vitals. GENERAL: Alert and in no acute distress. CARDIOPULMONARY: No increased WOB. Speaking in clear sentences.  PSYCH: Pleasant and cooperative. Speech normal rate and rhythm. Affect is appropriate. Insight and judgement are appropriate. Attention is focused, linear, and appropriate.  NEURO: Oriented as arrived to appointment on time with no prompting.   Attestation Statements:   This was prepared with the assistance of Engineer, civil (consulting).  Occasional wrong-word or  sound-a-like substitutions may have occurred due to the inherent limitations of voice recognition   Kirk Peper, DO

## 2024-05-02 DIAGNOSIS — Z7901 Long term (current) use of anticoagulants: Secondary | ICD-10-CM | POA: Diagnosis not present

## 2024-05-02 DIAGNOSIS — I4891 Unspecified atrial fibrillation: Secondary | ICD-10-CM | POA: Diagnosis not present

## 2024-05-04 ENCOUNTER — Other Ambulatory Visit: Payer: Self-pay | Admitting: Cardiology

## 2024-05-21 DIAGNOSIS — Z7901 Long term (current) use of anticoagulants: Secondary | ICD-10-CM | POA: Diagnosis not present

## 2024-05-21 DIAGNOSIS — G4733 Obstructive sleep apnea (adult) (pediatric): Secondary | ICD-10-CM | POA: Diagnosis not present

## 2024-06-24 DIAGNOSIS — M545 Low back pain, unspecified: Secondary | ICD-10-CM | POA: Diagnosis not present

## 2024-06-24 DIAGNOSIS — Z7901 Long term (current) use of anticoagulants: Secondary | ICD-10-CM | POA: Diagnosis not present

## 2024-06-24 DIAGNOSIS — M25549 Pain in joints of unspecified hand: Secondary | ICD-10-CM | POA: Diagnosis not present

## 2024-06-24 DIAGNOSIS — Z6833 Body mass index (BMI) 33.0-33.9, adult: Secondary | ICD-10-CM | POA: Diagnosis not present

## 2024-06-24 DIAGNOSIS — M6530 Trigger finger, unspecified finger: Secondary | ICD-10-CM | POA: Diagnosis not present

## 2024-06-24 DIAGNOSIS — Z86718 Personal history of other venous thrombosis and embolism: Secondary | ICD-10-CM | POA: Diagnosis not present

## 2024-06-24 DIAGNOSIS — D6869 Other thrombophilia: Secondary | ICD-10-CM | POA: Diagnosis not present

## 2024-06-25 ENCOUNTER — Ambulatory Visit: Admitting: Bariatrics

## 2024-06-27 ENCOUNTER — Ambulatory Visit: Admitting: Cardiology

## 2024-07-08 DIAGNOSIS — E1142 Type 2 diabetes mellitus with diabetic polyneuropathy: Secondary | ICD-10-CM | POA: Diagnosis not present

## 2024-07-08 DIAGNOSIS — E1151 Type 2 diabetes mellitus with diabetic peripheral angiopathy without gangrene: Secondary | ICD-10-CM | POA: Diagnosis not present

## 2024-07-08 DIAGNOSIS — B351 Tinea unguium: Secondary | ICD-10-CM | POA: Diagnosis not present

## 2024-07-15 ENCOUNTER — Encounter: Payer: Self-pay | Admitting: Bariatrics

## 2024-07-15 ENCOUNTER — Ambulatory Visit (INDEPENDENT_AMBULATORY_CARE_PROVIDER_SITE_OTHER): Admitting: Bariatrics

## 2024-07-15 VITALS — BP 139/69 | HR 47 | Temp 97.6°F | Ht 66.0 in | Wt 200.0 lb

## 2024-07-15 DIAGNOSIS — Z7984 Long term (current) use of oral hypoglycemic drugs: Secondary | ICD-10-CM | POA: Diagnosis not present

## 2024-07-15 DIAGNOSIS — E119 Type 2 diabetes mellitus without complications: Secondary | ICD-10-CM

## 2024-07-15 DIAGNOSIS — E1169 Type 2 diabetes mellitus with other specified complication: Secondary | ICD-10-CM

## 2024-07-15 DIAGNOSIS — Z6832 Body mass index (BMI) 32.0-32.9, adult: Secondary | ICD-10-CM

## 2024-07-15 DIAGNOSIS — E669 Obesity, unspecified: Secondary | ICD-10-CM

## 2024-07-15 NOTE — Progress Notes (Deleted)
                                                                                                              WEIGHT SUMMARY AND BIOMETRICS  No data recorded No data recorded  No data recorded No data recorded No data recorded No data recorded  OBESITY Eduardo Butler is here to discuss his progress with his obesity treatment plan along with follow-up of his obesity related diagnoses.    Nutrition Plan: the Category 3 plan - ***% adherence.  Current exercise: {exercise types:16438}  Interim History:  *** {aabnutritionassessment:29213}   Pharmacotherapy: Eduardo Butler is on {dwwpharmacotherapy:29109} Adverse side effects: {dwwse:29122} Hunger is {EWCONTROLASSESSMENT:24261}.  Cravings are {EWCONTROLASSESSMENT:24261}.  Assessment/Plan:   There are no diagnoses linked to this encounter.    {dwwmorbid:29108::Morbid Obesity}: Current BMI No data recorded  Pharmacotherapy Plan {dwwmed:29123}  {dwwpharmacotherapy:29109}  Eduardo Butler {CHL AMB IS/IS NOT:210130109} currently in the action stage of change. As such, his goal is to {MWMwtloss#1:210800005}.  He has agreed to {dwwsldiets:29085}.  Exercise goals: {MWM EXERCISE RECS:23473}  Behavioral modification strategies: {dwwslwtlossstrategies:29088}.  Eduardo Butler has agreed to follow-up with our clinic in {NUMBER 1-10:22536} weeks.   No orders of the defined types were placed in this encounter.   There are no discontinued medications.   No orders of the defined types were placed in this encounter.     Objective:   VITALS: Per patient if applicable, see vitals. GENERAL: Alert and in no acute distress. CARDIOPULMONARY: No increased WOB. Speaking in clear sentences.  PSYCH: Pleasant and cooperative. Speech normal rate and rhythm. Affect is appropriate. Insight and judgement are appropriate. Attention is focused, linear, and appropriate.  NEURO: Oriented as arrived to appointment on time with no prompting.   Attestation Statements:   This was  prepared with the assistance of Engineer, civil (consulting).  Occasional wrong-word or sound-a-like substitutions may have occurred due to the inherent limitations of voice recognition

## 2024-07-15 NOTE — Progress Notes (Signed)
 WEIGHT SUMMARY AND BIOMETRICS  Weight Lost Since Last Visit: 1lb  Weight Gained Since Last Visit: 0   Vitals Temp: 97.6 F (36.4 C) BP: 139/69 Pulse Rate: (!) 47 SpO2: 95 %   Anthropometric Measurements Height: 5' 6 (1.676 m) Weight: 200 lb (90.7 kg) BMI (Calculated): 32.3 Weight at Last Visit: 201lb Weight Lost Since Last Visit: 1lb Weight Gained Since Last Visit: 0 Starting Weight: 253lb Total Weight Loss (lbs): 53 lb (24 kg)   Body Composition  Body Fat %: 27.7 % Fat Mass (lbs): 55.4 lbs Muscle Mass (lbs): 137.6 lbs Total Body Water (lbs): 103 lbs Visceral Fat Rating : 18   Other Clinical Data Fasting: no Labs: no Today's Visit #: 18 Starting Date: 10/24/22    OBESITY Eduardo Butler is here to discuss his progress with his obesity treatment plan along with follow-up of his obesity related diagnoses.    Nutrition Plan: the Category 3 plan - 75% adherence.  Current exercise: weightlifting, yard work, and home exercise/stationary bike  Interim History:  He is down1 lb since his last visit. He is maintaining his weight at this time  Eating all of the food on the plan., Protein intake is as prescribed, Is not skipping meals, Water intake is adequate., and Denies polyphagia   Pharmacotherapy: Colan is on Metformin  1,000 mg BID Adverse side effects: None Hunger is moderately controlled.  Cravings are moderately controlled.  Assessment/Plan:   Type II Diabetes HgbA1c is at goal. Last A1c was below 7.  Episodes of hypoglycemia: no Medication(s): Metformin  1,000 BID  Lab Results  Component Value Date   HGBA1C 6.2 (H) 10/12/2023   Lab Results  Component Value Date   LDLCALC 82 11/24/2021   CREATININE 1.61 (H) 12/21/2023   No results found for: GFR  Plan: Continue Metformin  1,000 mg BID.  Will keep all carbohydrates low both sweets and  starches.  Will continue exercise regimen to 30 to 60 minutes on most days of the week.  Aim for 7 to 9 hours of sleep nightly.  Eat more low glycemic index foods.  He is eating fewer fried foods and baking more and eating less fats overall. He will continue his exercise regimen using his stationary bike,  also his road bike, weights and stretching.    Generalized Obesity: Current BMI BMI (Calculated): 32.3   Pharmacotherapy Plan Continue  Metformin  1,000 mg BID  Read is currently in the action stage of change. As such, his goal is to maintain weight for now.  He has agreed to the Category 3 plan.  Exercise goals: Older adults should follow the adult guidelines. When older adults cannot meet the adult guidelines, they should be as physically active as their abilities and conditions will allow.   Behavioral modification strategies: increasing lean protein intake, no meal skipping, meal planning , increase water intake, better snacking choices, planning for  success, increasing vegetables, avoiding temptations, keep healthy foods in the home, and weigh protein portions.  Carliss has agreed to follow-up with our clinic in 2 weeks.       Objective:   VITALS: Per patient if applicable, see vitals. GENERAL: Alert and in no acute distress. CARDIOPULMONARY: No increased WOB. Speaking in clear sentences.  PSYCH: Pleasant and cooperative. Speech normal rate and rhythm. Affect is appropriate. Insight and judgement are appropriate. Attention is focused, linear, and appropriate.  NEURO: Oriented as arrived to appointment on time with no prompting.   Attestation Statements:   This was prepared with the assistance of Engineer, civil (consulting).  Occasional wrong-word or sound-a-like substitutions may have occurred due to the inherent limitations of voice recognition   Clayborne Daring, DO

## 2024-07-22 ENCOUNTER — Other Ambulatory Visit: Payer: Self-pay | Admitting: Bariatrics

## 2024-07-22 DIAGNOSIS — E559 Vitamin D deficiency, unspecified: Secondary | ICD-10-CM

## 2024-07-22 NOTE — Telephone Encounter (Signed)
 Left message for patient to return call to see if he is still taking medication

## 2024-07-22 NOTE — Telephone Encounter (Signed)
 Pharmacy is calling requesting a refill for Vitamin D  for there patient.

## 2024-07-23 ENCOUNTER — Ambulatory Visit: Admitting: Physical Medicine and Rehabilitation

## 2024-07-23 ENCOUNTER — Encounter: Payer: Self-pay | Admitting: Physical Medicine and Rehabilitation

## 2024-07-23 ENCOUNTER — Other Ambulatory Visit: Payer: Self-pay | Admitting: Bariatrics

## 2024-07-23 DIAGNOSIS — G8929 Other chronic pain: Secondary | ICD-10-CM

## 2024-07-23 DIAGNOSIS — M5416 Radiculopathy, lumbar region: Secondary | ICD-10-CM

## 2024-07-23 DIAGNOSIS — M5441 Lumbago with sciatica, right side: Secondary | ICD-10-CM | POA: Diagnosis not present

## 2024-07-23 DIAGNOSIS — G4733 Obstructive sleep apnea (adult) (pediatric): Secondary | ICD-10-CM | POA: Diagnosis not present

## 2024-07-23 DIAGNOSIS — M961 Postlaminectomy syndrome, not elsewhere classified: Secondary | ICD-10-CM

## 2024-07-23 DIAGNOSIS — E559 Vitamin D deficiency, unspecified: Secondary | ICD-10-CM

## 2024-07-23 MED ORDER — VITAMIN D (ERGOCALCIFEROL) 1.25 MG (50000 UNIT) PO CAPS: 50000.0000 [IU] | ORAL_CAPSULE | ORAL | 0 refills | Status: AC

## 2024-07-23 NOTE — Telephone Encounter (Signed)
 Patient stated he did not receive his Vitamin d  prescription from his last office visit.

## 2024-07-23 NOTE — Progress Notes (Unsigned)
 Eduardo Butler - 73 y.o. male MRN [id number]  Date of birth: 23-Jul-1951  Office Visit Note: Visit Date: 07/23/2024 PCP: Eduardo Senior, MD Referred by: Eduardo Senior, MD  Subjective: Chief Complaint  Patient presents with   Lower Back - Pain   HPI: Eduardo Butler is a 73 y.o. male who comes in today per the request of Dr. Senior Eduardo for evaluation of chronic right sided lower back pain radiating to hip and groin region. Pain going for several years, worsens with activity and twisting to play golf. He describes pain as sore and aching sensation, currently denies pain at this time. His pain has significantly improved over the last 2 weeks with home exercise regimen and use of over the counter medications. Patient is fairly active, rides bicycle at the park, he also reports weight loss of 61 lbs since last year. His pain is not interfering with his functional ability at this time. Lumbar MRI imaging from 2021 shows right foraminal extrusion and L3 impingement at L3-L4, chronic left foraminal impingement at L4-L5, chronic right sided foraminal stenosis at L2-L3. No high grade spinal canal stenosis noted. History of left L4-L5 microdiscectomy with Dr. Gerldine Butler. History of left sacroiliac joint fusion. No history of lumbar injections. Patient denies focal weakness, numbness and tingling.      Review of Systems  Musculoskeletal:  Positive for back pain.  Neurological:  Negative for tingling, sensory change, focal weakness and weakness.  All other systems reviewed and are negative.  Otherwise per HPI.  Assessment & Plan: Visit Diagnoses:    ICD-10-CM   1. Chronic right-sided low back pain with right-sided sciatica  M54.41    G89.29     2. Lumbar radiculopathy  M54.16     3. Post laminectomy syndrome  M96.1        Plan: Findings:  Chronic right sided lower back pain radiating to hip and groin region. His pain has significantly improved over the last few weeks with continued  conservative therapies such as home exercise regimen and use of over the counter medications. Patients clinical presentation and exam are complex, differentials include lumbar radiculopathy vs facet mediated pain. I also can't completely exclude intrinsic right hip issue. His exam today is non focal, good strength noted to bilateral lower extremities. No pain noted with internal/external rotation of right hip. We discussed treatment plan in detail. Should his pain persist would consider performing lumbar injections. Would also consider obtaining new lumbar MRI imaging. We will see him back as needed. No red flag symptoms noted upon exam today.   He did inquire about referral for chronic bilateral hand pain. I helped him arrange appointment with our hand surgeon Dr. Erwin.     Meds & Orders: No orders of the defined types were placed in this encounter.  No orders of the defined types were placed in this encounter.   Follow-up: Return if symptoms worsen or fail to improve.   Procedures: No procedures performed      Clinical History: CLINICAL DATA:  Low back pain extending down the right leg   EXAM: MRI LUMBAR SPINE WITHOUT CONTRAST   TECHNIQUE: Multiplanar, multisequence MR imaging of the lumbar spine was performed. No intravenous contrast was administered.   COMPARISON:  11/25/2018   FINDINGS: Segmentation:  5 lumbar type vertebrae   Alignment:  Borderline retrolisthesis at L2-3.   Vertebrae:  No fracture, evidence of discitis, or bone lesion.   Conus medullaris and cauda equina: Conus extends to the  L1-2 level. Conus and cauda equina appear normal.   Paraspinal and other soft tissues: Sacroiliac screw on the left.   Disc levels:   T12- L1: Unremarkable.   L1-L2: Unremarkable.   L2-L3: Disc narrowing and bulging with endplate ridging. Mild degenerative facet spurring. Chronic moderate right foraminal narrowing.   L3-L4: Disc narrowing and bulging with right foraminal  extrusion. Disc herniation migrates superiorly to a mild degree in the right subarticular recess but does not impinge on the descending L4 nerve root. This is likely the symptomatic finding   L4-L5: Disc narrowing and left eccentric bulging with left foraminal protrusion impinging on the traversing L4 nerve root, chronic. Degenerative facet spurring on both sides. Patent canal   L5-S1:Mild facet spurring.  Maintained disc space.  No impingement   IMPRESSION: 1. 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 2. 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 3. L2-3 chronic moderate right foraminal narrowing.     Electronically Signed   By: Eduardo Butler M.D.   On: 07/22/2020 08:47   He reports that he has never smoked. He has never used smokeless tobacco.  Recent Labs    10/12/23 1106  HGBA1C 6.2*    Objective:  VS:  HT:    WT:   BMI:     BP:   HR: bpm  TEMP: ( )  RESP:  Physical Exam Vitals and nursing note reviewed.  HENT:     Head: Normocephalic and atraumatic.     Right Ear: External ear normal.     Left Ear: External ear normal.     Nose: Nose normal.     Mouth/Throat:     Mouth: Mucous membranes are moist.  Eyes:     Extraocular Movements: Extraocular movements intact.  Cardiovascular:     Rate and Rhythm: Normal rate.     Pulses: Normal pulses.  Pulmonary:     Effort: Pulmonary effort is normal.  Abdominal:     General: Abdomen is flat. There is no distension.  Musculoskeletal:        General: Tenderness present.     Cervical back: Normal range of motion.     Comments: Patient rises from seated position to standing without difficulty. Good lumbar range of motion. No pain noted with facet loading. 5/5 strength noted with bilateral hip flexion, knee flexion/extension, ankle dorsiflexion/plantarflexion and EHL. No clonus noted bilaterally. No pain upon palpation of greater trochanters. No pain with internal/external rotation of bilateral hips. Sensation  intact bilaterally. Negative slump test bilaterally. Ambulates without aid, gait steady.     Skin:    General: Skin is warm and dry.     Capillary Refill: Capillary refill takes less than 2 seconds.  Neurological:     General: No focal deficit present.     Mental Status: He is alert and oriented to person, place, and time.  Psychiatric:        Mood and Affect: Mood normal.        Behavior: Behavior normal.     Ortho Exam  Imaging: No results found.  Past Medical/Family/Surgical/Social History: Medications & Allergies reviewed per EMR, new medications updated. Patient Active Problem List   Diagnosis Date Noted   Hx of CABG 2003 (LIMA to LAD, free radial to LCx, sequential vein graft to acute marginal and distal RCA, 10/13/2023   Anemia 10/12/2023   AKI (acute kidney injury) (HCC) 10/12/2023   Bradycardia 10/12/2023   Angina pectoris (HCC) 10/12/2023   Acute heart failure with preserved ejection fraction (  HFpEF) (HCC) 10/12/2023   Non-insulin  treated type 2 diabetes mellitus (HCC) 12/08/2022   Other fatigue 10/25/2022   Dyspnea on exertion 10/25/2022   Visceral obesity 10/25/2022   Type 2 diabetes mellitus with obesity (HCC) 10/25/2022   Vitamin D  deficiency 10/25/2022   Bilateral lower extremity edema 09/27/2022   Paroxysmal atrial fibrillation (HCC) 10/21/2020   OSA (obstructive sleep apnea) 10/21/2020   HNP (herniated nucleus pulposus with myelopathy), 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   Long term (current) use of anticoagulants 07/02/2014   Lupus anticoagulant positive 07/02/2014   Benign hypertension 03/03/2014   Coronary artery disease 03/03/2014   Umbilical hernia 09/19/2011   Hyperlipidemia 01/07/2011   Anxiety state 01/07/2011   PULMONARY EMBOLISM AND INFARCTION 01/07/2011   Acute thromboembolism of deep veins of lower extremity (HCC) 01/07/2011   WEIGHT GAIN, ABNORMAL 01/07/2011   DYSPNEA 01/07/2011   Past Medical History:  Diagnosis Date   Anxiety    Arthritis    CHF  (congestive heart failure) (HCC)    Coronary artery disease    Diabetes (HCC)    H/O cardiac catheterization 08/25/2006   normal cath-patent grafts   H/O Doppler ultrasound 02/21/2011   lower ext.venous doppler,, no treatment except support stockings are recommended if clinically indicated   H/O echocardiogram 12/02/2010   EF 55% , no significant abnormalities   History of stress test 01/04/2013   Lexiscan  Myoview  ; scattered PVC's ; LV EF 54% ; Normal stress test    Hx of CABG 2003 (LIMA to LAD, free radial to LCx, sequential vein graft to acute marginal and distal RCA, 10/13/2023   Hyperlipidemia    Hypertension    Obesity    Peroneal DVT (deep venous thrombosis) (HCC)    Shortness of breath    Family History  Problem Relation Age of Onset   Diabetes Mother    Hypertension Brother    Diabetes Brother    Heart attack Neg Hx    Stroke Neg Hx    Past Surgical History:  Procedure Laterality Date   BYPASS GRAFT  2003   4 bypass    CARDIAC CATHETERIZATION  02/05/02   S/P sudden cardiac death; three vessel coronary artery disease; preserved overall left ventricular systolic function with loculated mild to moderate anterolateral hypokinesis; no mitral regurgitation noted   CARDIOVERSION N/A 11/17/2022   Procedure: CARDIOVERSION;  Surgeon: Kate Lonni CROME, MD;  Location: [facility] ENDOSCOPY;  Service: Cardiovascular;  Laterality: N/A;   CORONARY ARTERY BYPASS GRAFT  2003   HERNIA REPAIR     LEFT HEART CATH AND CORS/GRAFTS ANGIOGRAPHY N/A 04/23/2020   Procedure: LEFT HEART CATH AND CORS/GRAFTS ANGIOGRAPHY;  Surgeon: Court Dorn PARAS, MD;  Location: [facility] INVASIVE CV LAB;  Service: Cardiovascular;  Laterality: N/A;   LUMBAR LAMINECTOMY/ DECOMPRESSION WITH MET-RX Left 07/08/2014   Procedure: Left Lumbar Four-Five Metrex foraminal microdiskectomy;  Surgeon: Eduardo Maizes, MD;  Location: [facility] NEURO ORS;  Service: Neurosurgery;  Laterality: Left;  Left Lumbar Four-Five Metrex foraminal  microdiskectomy   PELVIC LAPAROSCOPY  2005   RIGHT/LEFT HEART CATH AND CORONARY/GRAFT ANGIOGRAPHY N/A 10/20/2023   Procedure: RIGHT/LEFT HEART CATH AND CORONARY/GRAFT ANGIOGRAPHY;  Surgeon: Ladona Heinz, MD;  Location: [facility] INVASIVE CV LAB;  Service: Cardiovascular;  Laterality: N/A;   SHOULDER SURGERY     UMBILICAL HERNIA REPAIR  11/17/2011   Procedure: HERNIA REPAIR UMBILICAL ADULT;  Surgeon: Vicenta DELENA Poli, MD;  Location: [facility];  Service: General;  Laterality: N/A;  umbilical hernia repair with mesh   Social History  Occupational History   Not on file  Tobacco Use   Smoking status: Never   Smokeless tobacco: Never  Substance and Sexual Activity   Alcohol use: No    Comment: none since 1979   Drug use: No   Sexual activity: Not on file

## 2024-07-23 NOTE — Progress Notes (Unsigned)
 Pain Scale   Average Pain 0 Patient advising he has lower back pain that radiates to his right hip and groin area, however it is not bothering him now.        +Driver, -BT, -Dye Allergies.

## 2024-07-25 ENCOUNTER — Telehealth: Payer: Self-pay | Admitting: Cardiovascular Disease

## 2024-07-25 NOTE — Telephone Encounter (Signed)
 Pt requesting a report from the last 90  days from his CPAP machine. He'll c/b with a fax number

## 2024-07-25 NOTE — Telephone Encounter (Signed)
 Patient called to report the previous fax number is incorrect and will need 3 months of his CPAP usage data faxed to [facility] (ph# [phone]),  fax# [phone].

## 2024-07-25 NOTE — Telephone Encounter (Signed)
 Pt calling to provide the fax number: [phone]

## 2024-07-26 DIAGNOSIS — Z7901 Long term (current) use of anticoagulants: Secondary | ICD-10-CM | POA: Diagnosis not present

## 2024-07-26 DIAGNOSIS — Z86718 Personal history of other venous thrombosis and embolism: Secondary | ICD-10-CM | POA: Diagnosis not present

## 2024-07-31 DIAGNOSIS — I25810 Atherosclerosis of coronary artery bypass graft(s) without angina pectoris: Secondary | ICD-10-CM | POA: Diagnosis not present

## 2024-07-31 DIAGNOSIS — Z0000 Encounter for general adult medical examination without abnormal findings: Secondary | ICD-10-CM | POA: Diagnosis not present

## 2024-07-31 DIAGNOSIS — E782 Mixed hyperlipidemia: Secondary | ICD-10-CM | POA: Diagnosis not present

## 2024-07-31 DIAGNOSIS — I10 Essential (primary) hypertension: Secondary | ICD-10-CM | POA: Diagnosis not present

## 2024-07-31 DIAGNOSIS — F419 Anxiety disorder, unspecified: Secondary | ICD-10-CM | POA: Diagnosis not present

## 2024-07-31 DIAGNOSIS — I4891 Unspecified atrial fibrillation: Secondary | ICD-10-CM | POA: Diagnosis not present

## 2024-07-31 DIAGNOSIS — D6869 Other thrombophilia: Secondary | ICD-10-CM | POA: Diagnosis not present

## 2024-07-31 DIAGNOSIS — Z7901 Long term (current) use of anticoagulants: Secondary | ICD-10-CM | POA: Diagnosis not present

## 2024-07-31 DIAGNOSIS — E1169 Type 2 diabetes mellitus with other specified complication: Secondary | ICD-10-CM | POA: Diagnosis not present

## 2024-07-31 DIAGNOSIS — Z23 Encounter for immunization: Secondary | ICD-10-CM | POA: Diagnosis not present

## 2024-07-31 DIAGNOSIS — Z1331 Encounter for screening for depression: Secondary | ICD-10-CM | POA: Diagnosis not present

## 2024-08-12 ENCOUNTER — Other Ambulatory Visit: Payer: Self-pay

## 2024-08-12 ENCOUNTER — Ambulatory Visit (INDEPENDENT_AMBULATORY_CARE_PROVIDER_SITE_OTHER): Admitting: Orthopedic Surgery

## 2024-08-12 DIAGNOSIS — M65332 Trigger finger, left middle finger: Secondary | ICD-10-CM

## 2024-08-12 DIAGNOSIS — M79641 Pain in right hand: Secondary | ICD-10-CM

## 2024-08-12 DIAGNOSIS — M65341 Trigger finger, right ring finger: Secondary | ICD-10-CM

## 2024-08-12 DIAGNOSIS — M79642 Pain in left hand: Secondary | ICD-10-CM

## 2024-08-12 MED ORDER — BETAMETHASONE SOD PHOS & ACET 6 (3-3) MG/ML IJ SUSP
6.0000 mg | INTRAMUSCULAR | Status: AC | PRN
  Administered 2024-08-12: 6 mg via INTRA_ARTICULAR

## 2024-08-12 MED ORDER — LIDOCAINE HCL 1 % IJ SOLN
1.0000 mL | INTRAMUSCULAR | Status: AC | PRN
  Administered 2024-08-12: 1 mL

## 2024-08-12 NOTE — Progress Notes (Signed)
 Eduardo Butler - 73 y.o. male MRN [id number]  Date of birth: 04/22/1951  Office Visit Note: Visit Date: 08/12/2024 PCP: Nanci Senior, MD Referred by: Nanci Senior, MD  Subjective: Chief Complaint  Patient presents with   Left Hand - Pain   Right Hand - Pain   HPI: Eduardo Butler is a pleasant 73 y.o. male who presents today for evaluation of bilateral hand complaints.  He is describing triggering with associated clicking and locking of the right ring finger and left long finger that is been present now for multiple months, worsening in nature.  He has difficulty with grip, enjoys playing golf is having trouble gripping the club currently.  Does describe locking of the bilateral digits that does require manual correction.  Has not undergone any prior treatment or workup.  He is diabetic, recent A1c 6.2.  Pertinent ROS were reviewed with the patient and found to be negative unless otherwise specified above in HPI.   Visit Reason: Right ring trigger, left long trigger Duration of symptoms:2 Years Hand dominance: left Occupation: No Diabetic: Yes, A1C- 10/12/2023- (6.2) Smoking: No Heart/Lung History: Heart-4 bypass 2003, A-fib Blood Thinners: Warfarin   Prior Testing/EMG: No Injections (Date): No Treatments: No Prior Surgery: No  Assessment & Plan: Visit Diagnoses:  1. Bilateral hand pain   2. Trigger finger, right ring finger   3. Trigger finger, left middle finger     Plan: Extensive discussion was had with the patient today regarding his right ring and left long finger trigger digit.  We discussed the etiology and pathophysiology of stenosing tenosynovitis.  We discussed conservative versus surgical treatment modalities.  From a conservative standpoint, we discussed activity modification, splinting, therapy and injections.  From a surgical standpoint, we discussed the possibility for trigger digit release as well as all risks and benefits associated.  Given that he has  not trialed conservative treatments, patient is appropriate candidate for cortisone injection to the right ring and left long finger A1 pulley for symptom relief.  Risks and benefit of the cortisone injection were discussed in detail, patient agreed to proceed.  Injections provided today without issue, patient will return in approximate 6 weeks time for a recheck.   Follow-up: No follow-ups on file.   Meds & Orders: No orders of the defined types were placed in this encounter.   Orders Placed This Encounter  Procedures   XR Hand Complete Left   XR Hand Complete Right     Procedures: Hand/UE Inj: R ring A1 for trigger finger on 08/12/2024 12:50 PM Indications: pain Details: 25 G needle, volar approach Medications: 1 mL lidocaine  1 %; 6 mg betamethasone  acetate-betamethasone  sodium phosphate  6 (3-3) MG/ML Consent was given by the patient. Patient was prepped and draped in the usual sterile fashion.    Hand/UE Inj: L long A1 for trigger finger on 08/12/2024 12:50 PM Details: 25 G needle, volar approach Medications: 1 mL lidocaine  1 %; 6 mg betamethasone  acetate-betamethasone  sodium phosphate  6 (3-3) MG/ML Outcome: tolerated well, no immediate complications Consent was given by the patient. Patient was prepped and draped in the usual sterile fashion.          Clinical History: CLINICAL DATA:  Low back pain extending down the right leg   EXAM: MRI LUMBAR SPINE WITHOUT CONTRAST   TECHNIQUE: Multiplanar, multisequence MR imaging of the lumbar spine was performed. No intravenous contrast was administered.   COMPARISON:  11/25/2018   FINDINGS: Segmentation:  5 lumbar type vertebrae  Alignment:  Borderline retrolisthesis at L2-3.   Vertebrae:  No fracture, evidence of discitis, or bone lesion.   Conus medullaris and cauda equina: Conus extends to the L1-2 level. Conus and cauda equina appear normal.   Paraspinal and other soft tissues: Sacroiliac screw on the left.   Disc  levels:   T12- L1: Unremarkable.   L1-L2: Unremarkable.   L2-L3: Disc narrowing and bulging with endplate ridging. Mild degenerative facet spurring. Chronic moderate right foraminal narrowing.   L3-L4: Disc narrowing and bulging with right foraminal extrusion. Disc herniation migrates superiorly to a mild degree in the right subarticular recess but does not impinge on the descending L4 nerve root. This is likely the symptomatic finding   L4-L5: Disc narrowing and left eccentric bulging with left foraminal protrusion impinging on the traversing L4 nerve root, chronic. Degenerative facet spurring on both sides. Patent canal   L5-S1:Mild facet spurring.  Maintained disc space.  No impingement   IMPRESSION: 1. 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 2. 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 3. L2-3 chronic moderate right foraminal narrowing.     Electronically Signed   By: Cassondra Roulette M.D.   On: 07/22/2020 08:47  He reports that he has never smoked. He has never used smokeless tobacco.  Recent Labs    10/12/23 1106  HGBA1C 6.2*    Objective:   Vital Signs: There were no vitals taken for this visit.  Physical Exam  Gen: Well-appearing, in no acute distress; non-toxic CV: Regular Rate. Well-perfused. Warm.  Resp: Breathing unlabored on room air; no wheezing. Psych: Fluid speech in conversation; appropriate affect; normal thought process  Ortho Exam Right hand: - Palpable nodule at the A1 pulley of the ring finger, associated tenderness - Notable clicking with deep flexion of the ring finger, there is evidence of significant locking with deep flexion - Sensation intact distally, hand remains warm well-perfused  Left hand: - Palpable nodule at the A1 pulley of the long finger, associated tenderness - Notable clicking with deep flexion of the long finger, moderate evidence of significant locking with deep flexion - Sensation intact distally, hand remains  warm well-perfused    Imaging: No results found.  Past Medical/Family/Surgical/Social History: Medications & Allergies reviewed per EMR, new medications updated. Patient Active Problem List   Diagnosis Date Noted   Hx of CABG 2003 (LIMA to LAD, free radial to LCx, sequential vein graft to acute marginal and distal RCA, 10/13/2023   Anemia 10/12/2023   AKI (acute kidney injury) (HCC) 10/12/2023   Bradycardia 10/12/2023   Angina pectoris (HCC) 10/12/2023   Acute heart failure with preserved ejection fraction (HFpEF) (HCC) 10/12/2023   Non-insulin  treated type 2 diabetes mellitus (HCC) 12/08/2022   Other fatigue 10/25/2022   Dyspnea on exertion 10/25/2022   Visceral obesity 10/25/2022   Type 2 diabetes mellitus with obesity (HCC) 10/25/2022   Vitamin D  deficiency 10/25/2022   Bilateral lower extremity edema 09/27/2022   Paroxysmal atrial fibrillation (HCC) 10/21/2020   OSA (obstructive sleep apnea) 10/21/2020   HNP (herniated nucleus pulposus with myelopathy), 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   Long term (current) use of anticoagulants 07/02/2014   Lupus anticoagulant positive 07/02/2014   Benign hypertension 03/03/2014   Coronary artery disease 03/03/2014   Umbilical hernia 09/19/2011   Hyperlipidemia 01/07/2011   Anxiety state 01/07/2011   PULMONARY EMBOLISM AND INFARCTION 01/07/2011   Acute thromboembolism of deep veins of lower extremity (HCC) 01/07/2011   WEIGHT GAIN, ABNORMAL 01/07/2011   DYSPNEA 01/07/2011   Past  Medical History:  Diagnosis Date   Anxiety    Arthritis    CHF (congestive heart failure) (HCC)    Coronary artery disease    Diabetes (HCC)    H/O cardiac catheterization 08/25/2006   normal cath-patent grafts   H/O Doppler ultrasound 02/21/2011   lower ext.venous doppler,, no treatment except support stockings are recommended if clinically indicated   H/O echocardiogram 12/02/2010   EF 55% , no significant abnormalities   History of stress test 01/04/2013    Lexiscan  Myoview  ; scattered PVC's ; LV EF 54% ; Normal stress test    Hx of CABG 2003 (LIMA to LAD, free radial to LCx, sequential vein graft to acute marginal and distal RCA, 10/13/2023   Hyperlipidemia    Hypertension    Obesity    Peroneal DVT (deep venous thrombosis) (HCC)    Shortness of breath    Family History  Problem Relation Age of Onset   Diabetes Mother    Hypertension Brother    Diabetes Brother    Heart attack Neg Hx    Stroke Neg Hx    Past Surgical History:  Procedure Laterality Date   BYPASS GRAFT  2003   4 bypass    CARDIAC CATHETERIZATION  02/24/2002   S/P sudden cardiac death; three vessel coronary artery disease; preserved overall left ventricular systolic function with loculated mild to moderate anterolateral hypokinesis; no mitral regurgitation noted   CARDIOVERSION N/A 11/17/2022   Procedure: CARDIOVERSION;  Surgeon: Kate Lonni CROME, MD;  Location: [facility] ENDOSCOPY;  Service: Cardiovascular;  Laterality: N/A;   CORONARY ARTERY BYPASS GRAFT  2003   HERNIA REPAIR     LEFT HEART CATH AND CORS/GRAFTS ANGIOGRAPHY N/A 04/23/2020   Procedure: LEFT HEART CATH AND CORS/GRAFTS ANGIOGRAPHY;  Surgeon: Court Dorn PARAS, MD;  Location: [facility] INVASIVE CV LAB;  Service: Cardiovascular;  Laterality: N/A;   LUMBAR LAMINECTOMY/ DECOMPRESSION WITH MET-RX Left 07/08/2014   Procedure: Left Lumbar Four-Five Metrex foraminal microdiskectomy;  Surgeon: Gerldine Maizes, MD;  Location: [facility] NEURO ORS;  Service: Neurosurgery;  Laterality: Left;  Left Lumbar Four-Five Metrex foraminal microdiskectomy   PELVIC LAPAROSCOPY  2005   RIGHT/LEFT HEART CATH AND CORONARY/GRAFT ANGIOGRAPHY N/A 10/20/2023   Procedure: RIGHT/LEFT HEART CATH AND CORONARY/GRAFT ANGIOGRAPHY;  Surgeon: Ladona Heinz, MD;  Location: [facility] INVASIVE CV LAB;  Service: Cardiovascular;  Laterality: N/A;   SHOULDER SURGERY     UMBILICAL HERNIA REPAIR  11/17/2011   Procedure: HERNIA REPAIR UMBILICAL ADULT;  Surgeon: Vicenta DELENA Poli, MD;  Location: [facility];  Service: General;  Laterality: N/A;  umbilical hernia repair with mesh   Social History   Occupational History   Not on file  Tobacco Use   Smoking status: Never   Smokeless tobacco: Never  Substance and Sexual Activity   Alcohol use: No    Comment: none since 1979   Drug use: No   Sexual activity: Not on file    Samanvitha Germany Afton Alderton, M.D. [facility], Hand Surgery

## 2024-08-15 DIAGNOSIS — Z7901 Long term (current) use of anticoagulants: Secondary | ICD-10-CM | POA: Diagnosis not present

## 2024-08-15 DIAGNOSIS — I4891 Unspecified atrial fibrillation: Secondary | ICD-10-CM | POA: Diagnosis not present

## 2024-08-28 ENCOUNTER — Ambulatory Visit: Admitting: Cardiology

## 2024-09-05 ENCOUNTER — Ambulatory Visit: Admitting: Cardiology

## 2024-09-12 DIAGNOSIS — F419 Anxiety disorder, unspecified: Secondary | ICD-10-CM | POA: Diagnosis not present

## 2024-09-12 DIAGNOSIS — I10 Essential (primary) hypertension: Secondary | ICD-10-CM | POA: Diagnosis not present

## 2024-09-12 DIAGNOSIS — E1169 Type 2 diabetes mellitus with other specified complication: Secondary | ICD-10-CM | POA: Diagnosis not present

## 2024-09-12 DIAGNOSIS — Z86718 Personal history of other venous thrombosis and embolism: Secondary | ICD-10-CM | POA: Diagnosis not present

## 2024-09-12 DIAGNOSIS — D6869 Other thrombophilia: Secondary | ICD-10-CM | POA: Diagnosis not present

## 2024-09-22 NOTE — Progress Notes (Unsigned)
  Electrophysiology Office Note:   Date:  09/23/2024  ID:  Eduardo Butler, DOB 1951-07-28, MRN [id number]  Primary Cardiologist: Dorn Lesches, MD Primary Heart Failure: None Electrophysiologist: Kataleia Quaranta Gladis Norton, MD      History of Present Illness:   Eduardo Butler is a 73 y.o. male with h/o atrial fibrillation, coronary artery disease post CABG x 4, PE due to lupus anticoagulant, hyperlipidemia seen today for routine electrophysiology followup.   Since last being seen in our clinic the patient reports doing overall well.  He has no acute complaints at this time.  His heart rate is slow, but is unaware.  He is able to do his daily activities.  He plays golf twice a week and rides a bike.  He has no fatigue or shortness of breath.  he denies chest pain, palpitations, dyspnea, PND, orthopnea, nausea, vomiting, dizziness, syncope, edema, weight gain, or early satiety.   Review of systems complete and found to be negative unless listed in HPI.   EP Information / Studies Reviewed:    EKG is ordered today. Personal review as below.  EKG Interpretation Date/Time:  Monday September 23 2024 08:59:58 EDT Ventricular Rate:  43 PR Interval:  150 QRS Duration:  88 QT Interval:  498 QTC Calculation: 420 R Axis:   81  Text Interpretation: Marked sinus bradycardia When compared with ECG of 06-Nov-2023 10:09, Incomplete right bundle branch block is no longer Present Confirmed by Nusaiba Guallpa ([id number]) on 09/23/2024 9:03:36 AM     Risk Assessment/Calculations:    CHA2DS2-VASc Score = 4   This indicates a 4.8% annual risk of stroke. The patient's score is based upon: CHF History: 1 HTN History: 1 Diabetes History: 1 Stroke History: 0 Vascular Disease History: 0 Age Score: 1 Gender Score: 0            Physical Exam:   VS:  BP (!) 110/56 (BP Location: Right Arm, Patient Position: Sitting, Cuff Size: Large)   Pulse (!) 43   Ht 5' 6 (1.676 m)   Wt 208 lb (94.3 kg)   SpO2 96%   BMI 33.57  kg/m    Wt Readings from Last 3 Encounters:  09/23/24 208 lb (94.3 kg)  07/15/24 200 lb (90.7 kg)  04/25/24 201 lb (91.2 kg)     GEN: Well nourished, well developed in no acute distress NECK: No JVD; No carotid bruits CARDIAC: Regular rate and rhythm, no murmurs, rubs, gallops RESPIRATORY:  Clear to auscultation without rales, wheezing or rhonchi  ABDOMEN: Soft, non-tender, non-distended EXTREMITIES:  No edema; No deformity   ASSESSMENT AND PLAN:    1.  Persistent atrial fibrillation: On amiodarone .  He remains in sinus rhythm.  He is feeling well and is able to exert himself.  Eduardo Butler make no changes at this time.  2.  Secondary hypercoagulable state: On Coumadin   3.  High risk medication monitoring: On amiodarone .  Eduardo Butler check TSH and LFTs today  4.  Coronary artery disease: No current chest pain.  Plan per primary cardiology  5.  Hypertension: Well-controlled   Follow up with [facility] in 6 months  Signed, Eduardo Butler Gladis Norton, MD

## 2024-09-23 ENCOUNTER — Encounter: Payer: Self-pay | Admitting: Cardiology

## 2024-09-23 ENCOUNTER — Ambulatory Visit: Admitting: Orthopedic Surgery

## 2024-09-23 ENCOUNTER — Ambulatory Visit: Attending: Cardiology | Admitting: Cardiology

## 2024-09-23 VITALS — BP 110/56 | HR 43 | Ht 66.0 in | Wt 208.0 lb

## 2024-09-23 DIAGNOSIS — M65341 Trigger finger, right ring finger: Secondary | ICD-10-CM | POA: Diagnosis not present

## 2024-09-23 DIAGNOSIS — I4819 Other persistent atrial fibrillation: Secondary | ICD-10-CM

## 2024-09-23 DIAGNOSIS — M65351 Trigger finger, right little finger: Secondary | ICD-10-CM

## 2024-09-23 DIAGNOSIS — M65332 Trigger finger, left middle finger: Secondary | ICD-10-CM | POA: Diagnosis not present

## 2024-09-23 DIAGNOSIS — I2510 Atherosclerotic heart disease of native coronary artery without angina pectoris: Secondary | ICD-10-CM | POA: Diagnosis not present

## 2024-09-23 DIAGNOSIS — D6869 Other thrombophilia: Secondary | ICD-10-CM

## 2024-09-23 DIAGNOSIS — Z79899 Other long term (current) drug therapy: Secondary | ICD-10-CM | POA: Diagnosis not present

## 2024-09-23 DIAGNOSIS — R001 Bradycardia, unspecified: Secondary | ICD-10-CM

## 2024-09-23 MED ORDER — ROSUVASTATIN CALCIUM 20 MG PO TABS: 20.0000 mg | ORAL_TABLET | Freq: Every day | ORAL | 1 refills | Status: AC

## 2024-09-23 MED ORDER — AMIODARONE HCL 200 MG PO TABS: 100.0000 mg | ORAL_TABLET | Freq: Every day | ORAL | 3 refills | Status: AC

## 2024-09-23 MED ORDER — LIDOCAINE HCL 1 % IJ SOLN
1.0000 mL | INTRAMUSCULAR | Status: AC | PRN
  Administered 2024-09-23: 1 mL

## 2024-09-23 MED ORDER — BETAMETHASONE SOD PHOS & ACET 6 (3-3) MG/ML IJ SUSP
6.0000 mg | INTRAMUSCULAR | Status: AC | PRN
  Administered 2024-09-23: 6 mg via INTRA_ARTICULAR

## 2024-09-23 NOTE — Progress Notes (Signed)
 Eduardo Butler - 73 y.o. male MRN [id number]  Date of birth: 10/29/1951  Office Visit Note: Visit Date: 09/23/2024 PCP: Nanci Senior, MD Referred by: Nanci Senior, MD  Subjective: No chief complaint on file.  HPI: Eduardo Butler is a pleasant 72 y.o. male who returns today for follow-up regarding right ring and left long finger trigger digits that underwent injection approximately 6 weeks prior.  States that those symptoms have improved with the injections, has noted right small finger triggering over the past few weeks which is new in onset.  He is diabetic, recent A1c 6.2.  Pertinent ROS were reviewed with the patient and found to be negative unless otherwise specified above in HPI.    Assessment & Plan: Visit Diagnoses:  1. Trigger finger, right little finger      Plan: Extensive discussion was had with the patient today regarding his right ring and left long finger trigger digit as well as his new onset right small finger trigger digit.  We reviewed the etiology and pathophysiology of stenosing tenosynovitis.  We discussed conservative versus surgical treatment modalities.  From a conservative standpoint, we discussed activity modification, splinting, therapy and injections.  From a surgical standpoint, we discussed the possibility for trigger digit release as well as all risks and benefits associated.  Given that he has not trialed conservative treatments for the right small finger, patient is appropriate candidate for cortisone injection for symptom relief.  I am pleased to see that he has responded well to the prior trigger digit injections as well.  Risks and benefit of the cortisone injection were again discussed in detail, patient agreed to proceed.  Injection provided today without issue to the right small finger A1 pulley region, patient will return in approximate 6 weeks time for a recheck.   Follow-up: No follow-ups on file.   Meds & Orders: No orders of the defined  types were placed in this encounter.   Orders Placed This Encounter  Procedures   Hand/UE Inj     Procedures: Hand/UE Inj: R small A1 for trigger finger on 09/23/2024 11:27 AM Indications: pain Details: 25 G needle, volar approach Medications: 1 mL lidocaine  1 %; 6 mg betamethasone  acetate-betamethasone  sodium phosphate  6 (3-3) MG/ML Outcome: tolerated well, no immediate complications Consent was given by the patient. Patient was prepped and draped in the usual sterile fashion.          Clinical History: CLINICAL DATA:  Low back pain extending down the right leg   EXAM: MRI LUMBAR SPINE WITHOUT CONTRAST   TECHNIQUE: Multiplanar, multisequence MR imaging of the lumbar spine was performed. No intravenous contrast was administered.   COMPARISON:  11/25/2018   FINDINGS: Segmentation:  5 lumbar type vertebrae   Alignment:  Borderline retrolisthesis at L2-3.   Vertebrae:  No fracture, evidence of discitis, or bone lesion.   Conus medullaris and cauda equina: Conus extends to the L1-2 level. Conus and cauda equina appear normal.   Paraspinal and other soft tissues: Sacroiliac screw on the left.   Disc levels:   T12- L1: Unremarkable.   L1-L2: Unremarkable.   L2-L3: Disc narrowing and bulging with endplate ridging. Mild degenerative facet spurring. Chronic moderate right foraminal narrowing.   L3-L4: Disc narrowing and bulging with right foraminal extrusion. Disc herniation migrates superiorly to a mild degree in the right subarticular recess but does not impinge on the descending L4 nerve root. This is likely the symptomatic finding   L4-L5: Disc narrowing and left  eccentric bulging with left foraminal protrusion impinging on the traversing L4 nerve root, chronic. Degenerative facet spurring on both sides. Patent canal   L5-S1:Mild facet spurring.  Maintained disc space.  No impingement   IMPRESSION: 1. 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 2.  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 3. L2-3 chronic moderate right foraminal narrowing.     Electronically Signed   By: Cassondra Roulette M.D.   On: 07/22/2020 08:47  He reports that he has never smoked. He has never used smokeless tobacco.  Recent Labs    10/12/23 1106  HGBA1C 6.2*    Objective:   Vital Signs: There were no vitals taken for this visit.  Physical Exam  Gen: Well-appearing, in no acute distress; non-toxic CV: Regular Rate. Well-perfused. Warm.  Resp: Breathing unlabored on room air; no wheezing. Psych: Fluid speech in conversation; appropriate affect; normal thought process  Ortho Exam Right hand: - Palpable nodule at the A1 pulley of the small finger, associated tenderness - Notable clicking with deep flexion of the small finger, there is evidence of significant locking with deep flexion - Sensation intact distally, hand remains warm well-perfused    Imaging: No results found.  Past Medical/Family/Surgical/Social History: Medications & Allergies reviewed per EMR, new medications updated. Patient Active Problem List   Diagnosis Date Noted   Hx of CABG 2003 (LIMA to LAD, free radial to LCx, sequential vein graft to acute marginal and distal RCA, 10/13/2023   Anemia 10/12/2023   AKI (acute kidney injury) 10/12/2023   Bradycardia 10/12/2023   Angina pectoris 10/12/2023   Acute heart failure with preserved ejection fraction (HFpEF) (HCC) 10/12/2023   Non-insulin  treated type 2 diabetes mellitus (HCC) 12/08/2022   Other fatigue 10/25/2022   Dyspnea on exertion 10/25/2022   Visceral obesity 10/25/2022   Type 2 diabetes mellitus with obesity 10/25/2022   Vitamin D  deficiency 10/25/2022   Bilateral lower extremity edema 09/27/2022   Paroxysmal atrial fibrillation (HCC) 10/21/2020   OSA (obstructive sleep apnea) 10/21/2020   HNP (herniated nucleus pulposus with myelopathy), 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   Long term (current) use of anticoagulants 07/02/2014    Lupus anticoagulant positive 07/02/2014   Benign hypertension 03/03/2014   Coronary artery disease 03/03/2014   Umbilical hernia 09/19/2011   Hyperlipidemia 01/07/2011   Anxiety state 01/07/2011   PULMONARY EMBOLISM AND INFARCTION 01/07/2011   Acute thromboembolism of deep veins of lower extremity (HCC) 01/07/2011   WEIGHT GAIN, ABNORMAL 01/07/2011   DYSPNEA 01/07/2011   Past Medical History:  Diagnosis Date   Anxiety    Arthritis    CHF (congestive heart failure) (HCC)    Coronary artery disease    Diabetes (HCC)    H/O cardiac catheterization 08/25/2006   normal cath-patent grafts   H/O Doppler ultrasound 02/21/2011   lower ext.venous doppler,, no treatment except support stockings are recommended if clinically indicated   H/O echocardiogram 12/02/2010   EF 55% , no significant abnormalities   History of stress test 01/04/2013   Lexiscan  Myoview  ; scattered PVC's ; LV EF 54% ; Normal stress test    Hx of CABG 2003 (LIMA to LAD, free radial to LCx, sequential vein graft to acute marginal and distal RCA, 10/13/2023   Hyperlipidemia    Hypertension    Obesity    Peroneal DVT (deep venous thrombosis) (HCC)    Shortness of breath    Family History  Problem Relation Age of Onset   Diabetes Mother    Hypertension Brother    Diabetes  Brother    Heart attack Neg Hx    Stroke Neg Hx    Past Surgical History:  Procedure Laterality Date   BYPASS GRAFT  2003   4 bypass    CARDIAC CATHETERIZATION  February 06, 2002   S/P sudden cardiac death; three vessel coronary artery disease; preserved overall left ventricular systolic function with loculated mild to moderate anterolateral hypokinesis; no mitral regurgitation noted   CARDIOVERSION N/A 11/17/2022   Procedure: CARDIOVERSION;  Surgeon: Kate Lonni CROME, MD;  Location: [facility] ENDOSCOPY;  Service: Cardiovascular;  Laterality: N/A;   CORONARY ARTERY BYPASS GRAFT  2003   HERNIA REPAIR     LEFT HEART CATH AND CORS/GRAFTS ANGIOGRAPHY  N/A 04/23/2020   Procedure: LEFT HEART CATH AND CORS/GRAFTS ANGIOGRAPHY;  Surgeon: Court Dorn PARAS, MD;  Location: [facility] INVASIVE CV LAB;  Service: Cardiovascular;  Laterality: N/A;   LUMBAR LAMINECTOMY/ DECOMPRESSION WITH MET-RX Left 07/08/2014   Procedure: Left Lumbar Four-Five Metrex foraminal microdiskectomy;  Surgeon: Gerldine Maizes, MD;  Location: [facility] NEURO ORS;  Service: Neurosurgery;  Laterality: Left;  Left Lumbar Four-Five Metrex foraminal microdiskectomy   PELVIC LAPAROSCOPY  2005   RIGHT/LEFT HEART CATH AND CORONARY/GRAFT ANGIOGRAPHY N/A 10/20/2023   Procedure: RIGHT/LEFT HEART CATH AND CORONARY/GRAFT ANGIOGRAPHY;  Surgeon: Ladona Heinz, MD;  Location: [facility] INVASIVE CV LAB;  Service: Cardiovascular;  Laterality: N/A;   SHOULDER SURGERY     UMBILICAL HERNIA REPAIR  11/17/2011   Procedure: HERNIA REPAIR UMBILICAL ADULT;  Surgeon: Vicenta DELENA Poli, MD;  Location:  SURGERY CENTER;  Service: General;  Laterality: N/A;  umbilical hernia repair with mesh   Social History   Occupational History   Not on file  Tobacco Use   Smoking status: Never   Smokeless tobacco: Never  Substance and Sexual Activity   Alcohol use: No    Comment: none since 1979   Drug use: No   Sexual activity: Not on file    Junie Engram Afton Alderton, M.D. [facility], Hand Surgery

## 2024-09-23 NOTE — Patient Instructions (Addendum)
 Medication Instructions:  Your physician recommends that you continue on your current medications as directed. Please refer to the Current Medication list given to you today.  *If you need a refill on your cardiac medications before your next appointment, please call your pharmacy*  Lab Work: None ordered  Testing/Procedures: None ordered  Follow-Up: At [facility], you and your health needs are our priority.  As part of our continuing mission to provide you with exceptional heart care, our providers are all part of one team.  This team includes your primary Cardiologist (physician) and Advanced Practice Providers or APPs (Physician Assistants and Nurse Practitioners) who all work together to provide you with the care you need, when you need it.  Your next appointment:   6 month(s)  Provider:   You will follow up in the Atrial Fibrillation Clinic located at [facility]. Your provider will be: Clint R. Fenton, PA-C or Minnie Amber, PA-C     Thank you for choosing [employer]!!   Reece Cane, RN [phone]

## 2024-09-30 ENCOUNTER — Encounter: Payer: Self-pay | Admitting: Radiology

## 2024-10-02 DIAGNOSIS — I4891 Unspecified atrial fibrillation: Secondary | ICD-10-CM | POA: Diagnosis not present

## 2024-10-02 DIAGNOSIS — Z7901 Long term (current) use of anticoagulants: Secondary | ICD-10-CM | POA: Diagnosis not present

## 2024-10-15 ENCOUNTER — Ambulatory Visit: Admitting: Bariatrics

## 2024-10-22 DIAGNOSIS — M2042 Other hammer toe(s) (acquired), left foot: Secondary | ICD-10-CM | POA: Diagnosis not present

## 2024-10-22 DIAGNOSIS — L600 Ingrowing nail: Secondary | ICD-10-CM | POA: Diagnosis not present

## 2024-10-22 DIAGNOSIS — E1142 Type 2 diabetes mellitus with diabetic polyneuropathy: Secondary | ICD-10-CM | POA: Diagnosis not present

## 2024-10-22 DIAGNOSIS — E1151 Type 2 diabetes mellitus with diabetic peripheral angiopathy without gangrene: Secondary | ICD-10-CM | POA: Diagnosis not present

## 2024-10-22 DIAGNOSIS — M2041 Other hammer toe(s) (acquired), right foot: Secondary | ICD-10-CM | POA: Diagnosis not present

## 2024-10-22 DIAGNOSIS — B351 Tinea unguium: Secondary | ICD-10-CM | POA: Diagnosis not present

## 2024-11-01 DIAGNOSIS — Z7901 Long term (current) use of anticoagulants: Secondary | ICD-10-CM | POA: Diagnosis not present

## 2024-11-01 DIAGNOSIS — Z86718 Personal history of other venous thrombosis and embolism: Secondary | ICD-10-CM | POA: Diagnosis not present

## 2024-11-04 ENCOUNTER — Ambulatory Visit: Admitting: Orthopedic Surgery

## 2024-11-04 DIAGNOSIS — M65332 Trigger finger, left middle finger: Secondary | ICD-10-CM | POA: Diagnosis not present

## 2024-11-04 NOTE — Progress Notes (Signed)
 Eduardo Butler - 73 y.o. male MRN [id number]  Date of birth: 07-26-1951  Office Visit Note: Visit Date: 11/04/2024 PCP: Nanci Senior, MD Referred by: Nanci Senior, MD  Subjective: No chief complaint on file.  HPI: Eduardo Butler is a pleasant 73 y.o. male who returns today for follow-up regarding multiple trigger digits that have undergone previous injection, right ring and left long finger trigger digits that underwent injection approximately 3 months prior as well as a right small finger that underwent injection 6 weeks prior.  Today's visit is primarily for the left long finger which does have some ongoing soreness, denies any significant clicking or locking currently.  Pertinent ROS were reviewed with the patient and found to be negative unless otherwise specified above in HPI.    Assessment & Plan: Visit Diagnoses:  1. Trigger finger, left middle finger       Plan: Extensive discussion was had with the patient today regarding his left long trigger digit.  We reviewed the etiology and pathophysiology of stenosing tenosynovitis.  We discussed conservative versus surgical treatment modalities.  From a conservative standpoint, we discussed activity modification, splinting, therapy and injections.  From a surgical standpoint, we discussed the possibility for trigger digit release as well as all risks and benefits associated.  He would like to continue with conservative measures for the time being.  Will return to me in the future should the left long finger symptoms become more significant from a clicking and locking standpoint.   Follow-up: No follow-ups on file.   Meds & Orders: No orders of the defined types were placed in this encounter.   No orders of the defined types were placed in this encounter.    Procedures: No procedures performed      Clinical History: CLINICAL DATA:  Low back pain extending down the right leg   EXAM: MRI LUMBAR SPINE WITHOUT CONTRAST    TECHNIQUE: Multiplanar, multisequence MR imaging of the lumbar spine was performed. No intravenous contrast was administered.   COMPARISON:  11/25/2018   FINDINGS: Segmentation:  5 lumbar type vertebrae   Alignment:  Borderline retrolisthesis at L2-3.   Vertebrae:  No fracture, evidence of discitis, or bone lesion.   Conus medullaris and cauda equina: Conus extends to the L1-2 level. Conus and cauda equina appear normal.   Paraspinal and other soft tissues: Sacroiliac screw on the left.   Disc levels:   T12- L1: Unremarkable.   L1-L2: Unremarkable.   L2-L3: Disc narrowing and bulging with endplate ridging. Mild degenerative facet spurring. Chronic moderate right foraminal narrowing.   L3-L4: Disc narrowing and bulging with right foraminal extrusion. Disc herniation migrates superiorly to a mild degree in the right subarticular recess but does not impinge on the descending L4 nerve root. This is likely the symptomatic finding   L4-L5: Disc narrowing and left eccentric bulging with left foraminal protrusion impinging on the traversing L4 nerve root, chronic. Degenerative facet spurring on both sides. Patent canal   L5-S1:Mild facet spurring.  Maintained disc space.  No impingement   IMPRESSION: 1. 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 2. 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 3. L2-3 chronic moderate right foraminal narrowing.     Electronically Signed   By: Cassondra Roulette M.D.   On: 07/22/2020 08:47  He reports that he has never smoked. He has never used smokeless tobacco.  No results for input(s): HGBA1C, LABURIC in the last 8760 hours.   Objective:   Vital Signs: There  were no vitals taken for this visit.  Physical Exam  Gen: Well-appearing, in no acute distress; non-toxic CV: Regular Rate. Well-perfused. Warm.  Resp: Breathing unlabored on room air; no wheezing. Psych: Fluid speech in conversation; appropriate affect; normal thought  process  Ortho Exam Left hand: - Palpable nodule at the A1 pulley of the long finger, associated tenderness - Minimal clicking with deep flexion of the long finger, there is no significant evidence of locking with deep flexion on today's examination - Sensation intact distally, hand remains warm well-perfused    Imaging: No results found.  Past Medical/Family/Surgical/Social History: Medications & Allergies reviewed per EMR, new medications updated. Patient Active Problem List   Diagnosis Date Noted   Hx of CABG 2003 (LIMA to LAD, free radial to LCx, sequential vein graft to acute marginal and distal RCA, 10/13/2023   Anemia 10/12/2023   AKI (acute kidney injury) 10/12/2023   Bradycardia 10/12/2023   Angina pectoris 10/12/2023   Acute heart failure with preserved ejection fraction (HFpEF) (HCC) 10/12/2023   Non-insulin  treated type 2 diabetes mellitus (HCC) 12/08/2022   Other fatigue 10/25/2022   Dyspnea on exertion 10/25/2022   Visceral obesity 10/25/2022   Type 2 diabetes mellitus with obesity 10/25/2022   Vitamin D  deficiency 10/25/2022   Bilateral lower extremity edema 09/27/2022   Paroxysmal atrial fibrillation (HCC) 10/21/2020   OSA (obstructive sleep apnea) 10/21/2020   HNP (herniated nucleus pulposus with myelopathy), 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   Long term (current) use of anticoagulants 07/02/2014   Lupus anticoagulant positive 07/02/2014   Benign hypertension 03/03/2014   Coronary artery disease 03/03/2014   Umbilical hernia 09/19/2011   Hyperlipidemia 01/07/2011   Anxiety state 01/07/2011   PULMONARY EMBOLISM AND INFARCTION 01/07/2011   Acute thromboembolism of deep veins of lower extremity (HCC) 01/07/2011   WEIGHT GAIN, ABNORMAL 01/07/2011   DYSPNEA 01/07/2011   Past Medical History:  Diagnosis Date   Anxiety    Arthritis    CHF (congestive heart failure) (HCC)    Coronary artery disease    Diabetes (HCC)    H/O cardiac catheterization 08/25/2006   normal  cath-patent grafts   H/O Doppler ultrasound 02/21/2011   lower ext.venous doppler,, no treatment except support stockings are recommended if clinically indicated   H/O echocardiogram 12/02/2010   EF 55% , no significant abnormalities   History of stress test 01/04/2013   Lexiscan  Myoview  ; scattered PVC's ; LV EF 54% ; Normal stress test    Hx of CABG 2003 (LIMA to LAD, free radial to LCx, sequential vein graft to acute marginal and distal RCA, 10/13/2023   Hyperlipidemia    Hypertension    Obesity    Peroneal DVT (deep venous thrombosis) (HCC)    Shortness of breath    Family History  Problem Relation Age of Onset   Diabetes Mother    Hypertension Brother    Diabetes Brother    Heart attack Neg Hx    Stroke Neg Hx    Past Surgical History:  Procedure Laterality Date   BYPASS GRAFT  2003   4 bypass    CARDIAC CATHETERIZATION  02/14/2002   S/P sudden cardiac death; three vessel coronary artery disease; preserved overall left ventricular systolic function with loculated mild to moderate anterolateral hypokinesis; no mitral regurgitation noted   CARDIOVERSION N/A 11/17/2022   Procedure: CARDIOVERSION;  Surgeon: Kate Lonni CROME, MD;  Location: [facility] ENDOSCOPY;  Service: Cardiovascular;  Laterality: N/A;   CORONARY ARTERY BYPASS GRAFT  2003   HERNIA REPAIR  LEFT HEART CATH AND CORS/GRAFTS ANGIOGRAPHY N/A 04/23/2020   Procedure: LEFT HEART CATH AND CORS/GRAFTS ANGIOGRAPHY;  Surgeon: Court Dorn PARAS, MD;  Location: [facility] INVASIVE CV LAB;  Service: Cardiovascular;  Laterality: N/A;   LUMBAR LAMINECTOMY/ DECOMPRESSION WITH MET-RX Left 07/08/2014   Procedure: Left Lumbar Four-Five Metrex foraminal microdiskectomy;  Surgeon: Gerldine Maizes, MD;  Location: [facility] NEURO ORS;  Service: Neurosurgery;  Laterality: Left;  Left Lumbar Four-Five Metrex foraminal microdiskectomy   PELVIC LAPAROSCOPY  2005   RIGHT/LEFT HEART CATH AND CORONARY/GRAFT ANGIOGRAPHY N/A 10/20/2023   Procedure:  RIGHT/LEFT HEART CATH AND CORONARY/GRAFT ANGIOGRAPHY;  Surgeon: Ladona Heinz, MD;  Location: [facility] INVASIVE CV LAB;  Service: Cardiovascular;  Laterality: N/A;   SHOULDER SURGERY     UMBILICAL HERNIA REPAIR  11/17/2011   Procedure: HERNIA REPAIR UMBILICAL ADULT;  Surgeon: Vicenta DELENA Poli, MD;  Location: [facility];  Service: General;  Laterality: N/A;  umbilical hernia repair with mesh   Social History   Occupational History   Not on file  Tobacco Use   Smoking status: Never   Smokeless tobacco: Never  Substance and Sexual Activity   Alcohol use: No    Comment: none since 1979   Drug use: No   Sexual activity: Not on file    Beuna Bolding Afton Alderton, M.D. [facility], Hand Surgery

## 2024-12-02 ENCOUNTER — Ambulatory Visit: Admitting: Bariatrics

## 2024-12-02 ENCOUNTER — Telehealth: Payer: Self-pay | Admitting: Orthopedic Surgery

## 2024-12-02 NOTE — Telephone Encounter (Signed)
 Called and spoke with pt, will start surgery sheet

## 2024-12-02 NOTE — Telephone Encounter (Signed)
 Pt called and would like to mover forward with surgery. Please call pt about this matter at [phone]

## 2024-12-05 ENCOUNTER — Encounter: Payer: Self-pay | Admitting: Orthopedic Surgery

## 2024-12-09 ENCOUNTER — Ambulatory Visit: Admitting: Bariatrics

## 2024-12-09 ENCOUNTER — Telehealth (HOSPITAL_BASED_OUTPATIENT_CLINIC_OR_DEPARTMENT_OTHER): Payer: Self-pay

## 2024-12-09 NOTE — Telephone Encounter (Signed)
" ° °  Name: Eduardo Butler  DOB: Mar 04, 1951  MRN: [id number]  Primary Cardiologist: Dorn Lesches, MD   Preoperative team, please contact this patient and set up a phone call appointment for further preoperative risk assessment. Please obtain consent and complete medication review. Thank you for your help.  I confirm that guidance regarding antiplatelet and oral anticoagulation therapy has been completed and, if necessary, noted below.  Coumadin  followed by [facility], surgeon should contact them for recommendations.  I also confirmed the patient resides in the state of [place] . As per [facility] Medical Board telemedicine laws, the patient must reside in the state in which the provider is licensed.   Lamarr Satterfield, NP 12/09/2024, 1:47 PM [facility]    "

## 2024-12-09 NOTE — Telephone Encounter (Signed)
 Called and left the patient a detailed message to schedule pre-op clearance. 1st attempt.

## 2024-12-09 NOTE — Telephone Encounter (Signed)
 According to doctor's last office visit note, patient decided to continue with conservative care. No, I do not have a surgery sheet for this patient.

## 2024-12-09 NOTE — Telephone Encounter (Signed)
"  ° °  Pre-operative Risk Assessment    Patient Name: Eduardo Butler  DOB: October 14, 1951 MRN: [id number]   Date of last office visit: 09/23/2024 with Dr. Inocencio Date of next office visit: None  Request for Surgical Clearance    Procedure:  Left long finger trigger digit release   Date of Surgery:  Clearance TBD                                 Surgeon:  Gildardo Alderton, MD Surgeon's Group or Practice Name:  [facility]  Phone number:  [phone] Fax number:  [phone] - Attn: April   Type of Clearance Requested:   - Medical  - Pharmacy:  Hold Aspirin  and Warfarin (Coumadin ) 3 days prior to surgery   Type of Anesthesia:  MAC   Additional requests/questions:  None  Signed, Patrcia Iverson CROME   12/09/2024, 1:10 PM   "

## 2024-12-10 NOTE — Telephone Encounter (Signed)
 Spoke with the patient to schedule tele preop clearance appt. Pt states that he informed the requesting surgeon's office that he no longer wants to have the procedure done.

## 2024-12-11 ENCOUNTER — Other Ambulatory Visit: Payer: Self-pay | Admitting: Cardiology

## 2024-12-11 ENCOUNTER — Telehealth: Payer: Self-pay | Admitting: Cardiology

## 2024-12-11 NOTE — Telephone Encounter (Signed)
" °*  STAT* If patient is at the pharmacy, call can be transferred to refill team.   1. Which medications need to be refilled? (please list name of each medication and dose if known)   Torsemide  40 MG TABS     2. Would you like to learn more about the convenience, safety, & potential cost savings by using the [facility]? No    3. Are you open to using the [facility] (Type [facility]. No    4. Which pharmacy/location (including street and city if local pharmacy) is medication to be sent to?[id number] - [place], [place] - [address]    5. Do they need a 30 day or 90 day supply? 90 day   Pt out of medication.  "

## 2024-12-11 NOTE — Telephone Encounter (Signed)
 Patient is no longer planning to have surgery. Will remove from pre-op pool.

## 2024-12-12 NOTE — Telephone Encounter (Signed)
 In accordance with refill protocols, please review and address the following requirements before this medication refill can be authorized:  Labs

## 2024-12-16 ENCOUNTER — Other Ambulatory Visit: Payer: Self-pay | Admitting: Cardiology

## 2024-12-17 ENCOUNTER — Other Ambulatory Visit: Payer: Self-pay | Admitting: Cardiovascular Disease

## 2024-12-17 ENCOUNTER — Other Ambulatory Visit: Payer: Self-pay

## 2024-12-17 MED ORDER — TORSEMIDE 40 MG PO TABS: 40.0000 mg | ORAL_TABLET | Freq: Every day | ORAL | 0 refills | Status: DC

## 2024-12-17 MED ORDER — TORSEMIDE 40 MG PO TABS: 40.0000 mg | ORAL_TABLET | Freq: Every day | ORAL | 2 refills | Status: AC

## 2024-12-23 ENCOUNTER — Ambulatory Visit: Admitting: Bariatrics

## 2024-12-31 ENCOUNTER — Ambulatory Visit: Admitting: Cardiovascular Disease

## 2025-01-07 ENCOUNTER — Ambulatory Visit: Admitting: Orthopedic Surgery

## 2025-02-03 ENCOUNTER — Ambulatory Visit: Admitting: Bariatrics

## 2025-03-11 ENCOUNTER — Ambulatory Visit: Admitting: Cardiovascular Disease

## 2025-03-24 ENCOUNTER — Ambulatory Visit (HOSPITAL_COMMUNITY): Admitting: Physician Assistant
